# Patient Record
Sex: Male | Born: 1955
Health system: Southern US, Community
[De-identification: ages and names within clinical notes are randomized; demographics above are authoritative.]

## PROBLEM LIST (undated history)

## (undated) DIAGNOSIS — E7800 Pure hypercholesterolemia, unspecified: Secondary | ICD-10-CM

## (undated) DIAGNOSIS — M1990 Unspecified osteoarthritis, unspecified site: Secondary | ICD-10-CM

## (undated) DIAGNOSIS — M3320 Polymyositis, organ involvement unspecified: Secondary | ICD-10-CM

## (undated) DIAGNOSIS — G7289 Other specified myopathies: Secondary | ICD-10-CM

## (undated) DIAGNOSIS — J189 Pneumonia, unspecified organism: Secondary | ICD-10-CM

## (undated) DIAGNOSIS — M109 Gout, unspecified: Secondary | ICD-10-CM

## (undated) DIAGNOSIS — M25569 Pain in unspecified knee: Secondary | ICD-10-CM

## (undated) HISTORY — PX: EYE SURGERY: SHX253

## (undated) HISTORY — PX: KNEE SURGERY: SHX244

## (undated) HISTORY — DX: Pure hypercholesterolemia, unspecified: E78.00

## (undated) HISTORY — PX: OTHER SURGICAL HISTORY: SHX169

## (undated) HISTORY — DX: Unspecified osteoarthritis, unspecified site: M19.90

## (undated) HISTORY — DX: Pain in unspecified knee: M25.569

## (undated) HISTORY — PX: ANKLE SURGERY: SHX546

---

## 2003-11-08 ENCOUNTER — Ambulatory Visit (HOSPITAL_COMMUNITY): Admission: RE | Admit: 2003-11-08 | Discharge: 2003-11-08 | Payer: Self-pay | Admitting: Internal Medicine

## 2009-06-15 ENCOUNTER — Encounter: Payer: Self-pay | Admitting: Internal Medicine

## 2009-06-28 ENCOUNTER — Ambulatory Visit: Payer: Self-pay | Admitting: Internal Medicine

## 2009-06-28 ENCOUNTER — Ambulatory Visit (HOSPITAL_COMMUNITY): Admission: RE | Admit: 2009-06-28 | Discharge: 2009-06-28 | Payer: Self-pay | Admitting: Internal Medicine

## 2009-07-03 ENCOUNTER — Encounter: Payer: Self-pay | Admitting: Internal Medicine

## 2014-09-27 ENCOUNTER — Other Ambulatory Visit (HOSPITAL_COMMUNITY): Payer: Self-pay | Admitting: Internal Medicine

## 2014-09-27 DIAGNOSIS — R7989 Other specified abnormal findings of blood chemistry: Secondary | ICD-10-CM

## 2014-09-27 DIAGNOSIS — R945 Abnormal results of liver function studies: Principal | ICD-10-CM

## 2014-09-30 ENCOUNTER — Ambulatory Visit (HOSPITAL_COMMUNITY)
Admission: RE | Admit: 2014-09-30 | Discharge: 2014-09-30 | Disposition: A | Discharge status: Home / Self Care | Payer: Managed Care, Other (non HMO) | Source: Ambulatory Visit | Attending: Internal Medicine | Admitting: Internal Medicine

## 2014-09-30 ENCOUNTER — Ambulatory Visit (HOSPITAL_COMMUNITY): Payer: Managed Care, Other (non HMO)

## 2014-09-30 DIAGNOSIS — R109 Unspecified abdominal pain: Secondary | ICD-10-CM | POA: Insufficient documentation

## 2014-09-30 DIAGNOSIS — R7989 Other specified abnormal findings of blood chemistry: Secondary | ICD-10-CM | POA: Insufficient documentation

## 2014-09-30 DIAGNOSIS — R945 Abnormal results of liver function studies: Secondary | ICD-10-CM

## 2014-10-10 ENCOUNTER — Encounter: Payer: Self-pay | Admitting: Internal Medicine

## 2014-10-24 ENCOUNTER — Encounter: Payer: Self-pay | Admitting: Nurse Practitioner

## 2014-10-24 ENCOUNTER — Other Ambulatory Visit: Payer: Self-pay | Admitting: Nurse Practitioner

## 2014-10-24 ENCOUNTER — Ambulatory Visit (INDEPENDENT_AMBULATORY_CARE_PROVIDER_SITE_OTHER): Payer: Managed Care, Other (non HMO) | Admitting: Nurse Practitioner

## 2014-10-24 VITALS — BP 122/79 | HR 73 | Temp 97.0°F | Ht 73.0 in | Wt 228.4 lb

## 2014-10-24 DIAGNOSIS — R740 Nonspecific elevation of levels of transaminase and lactic acid dehydrogenase [LDH]: Secondary | ICD-10-CM

## 2014-10-24 DIAGNOSIS — R7401 Elevation of levels of liver transaminase levels: Secondary | ICD-10-CM | POA: Insufficient documentation

## 2014-10-24 LAB — HEPATIC FUNCTION PANEL
ALT: 219 U/L — AB (ref 0–53)
AST: 158 U/L — AB (ref 0–37)
Albumin: 4.1 g/dL (ref 3.5–5.2)
Alkaline Phosphatase: 80 U/L (ref 39–117)
BILIRUBIN TOTAL: 0.4 mg/dL (ref 0.2–1.2)
Bilirubin, Direct: 0.1 mg/dL (ref 0.0–0.3)
Indirect Bilirubin: 0.3 mg/dL (ref 0.2–1.2)
TOTAL PROTEIN: 7 g/dL (ref 6.0–8.3)

## 2014-10-24 LAB — FERRITIN: Ferritin: 158 ng/mL (ref 22–322)

## 2014-10-24 LAB — IRON AND TIBC
%SAT: 24 % (ref 20–55)
Iron: 69 ug/dL (ref 42–165)
TIBC: 290 ug/dL (ref 215–435)
UIBC: 221 ug/dL (ref 125–400)

## 2014-10-24 NOTE — Patient Instructions (Addendum)
We are ordering several labs to check for other causes of your elevated liver enzymes  Start a low-fat diet and exercise to promote weight loss  Return for follow-up in 4 weeks   Fatty Liver Fatty liver is the accumulation of fat in liver cells. It is also called hepatosteatosis or steatohepatitis. It is normal for your liver to contain some fat. If fat is more than 5 to 10% of your liver's weight, you have fatty liver.  There are often no symptoms (problems) for years while damage is still occurring. People often learn about their fatty liver when they have medical tests for other reasons. Fat can damage your liver for years or even decades without causing problems. When it becomes severe, it can cause fatigue, weight loss, weakness, and confusion. This makes you more likely to develop more serious liver problems. The liver is the largest organ in the body. It does a lot of work and often gives no warning signs when it is sick until late in a disease. The liver has many important jobs including:  Breaking down foods.  Storing vitamins, iron, and other minerals.  Making proteins.  Making bile for food digestion.  Breaking down many products including medications, alcohol and some poisons. CAUSES  There are a number of different conditions, medications, and poisons that can cause a fatty liver. Eating too many calories causes fat to build up in the liver. Not processing and breaking fats down normally may also cause this. Certain conditions, such as obesity, diabetes, and high triglycerides also cause this. Most fatty liver patients tend to be middle-aged and over weight.  Some causes of fatty liver are:  Alcohol over consumption.  Malnutrition.  Steroid use.  Valproic acid toxicity.  Obesity.  Cushing's syndrome.  Poisons.  Tetracycline in high dosages.  Pregnancy.  Diabetes.  Hyperlipidemia.  Rapid weight loss. Some people develop fatty liver even having none of  these conditions. SYMPTOMS  Fatty liver most often causes no problems. This is called asymptomatic.  It can be diagnosed with blood tests and also by a liver biopsy.  It is one of the most common causes of minor elevations of liver enzymes on routine blood tests.  Specialized Imaging of the liver using ultrasound, CT (computed tomography) scan, or MRI (magnetic resonance imaging) can suggest a fatty liver but a biopsy is needed to confirm it.  A biopsy involves taking a small sample of liver tissue. This is done by using a needle. It is then looked at under a microscope by a specialist. TREATMENT  It is important to treat the cause. Simple fatty liver without a medical reason may not need treatment.  Weight loss, fat restriction, and exercise in overweight patients produces inconsistent results but is worth trying.  Fatty liver due to alcohol toxicity may not improve even with stopping drinking.  Good control of diabetes may reduce fatty liver.  Lower your triglycerides through diet, medication or both.  Eat a balanced, healthy diet.  Increase your physical activity.  Get regular checkups from a liver specialist.  There are no medical or surgical treatments for a fatty liver or NASH, but improving your diet and increasing your exercise may help prevent or reverse some of the damage. PROGNOSIS  Fatty liver may cause no damage or it can lead to an inflammation of the liver. This is, called steatohepatitis. When it is linked to alcohol abuse, it is called alcoholic steatohepatitis. It often is not linked to alcohol. It is  then called nonalcoholic steatohepatitis, or NASH. Over time the liver may become scarred and hardened. This condition is called cirrhosis. Cirrhosis is serious and may lead to liver failure or cancer. NASH is one of the leading causes of cirrhosis. About 10-20% of Americans have fatty liver and a smaller 2-5% has NASH. Document Released: 09/13/2005 Document Revised:  10/21/2011 Document Reviewed: 12/08/2013 [facility] Patient Information 2015 [place], [place]. This information is not intended to replace advice given to you by your health care provider. Make sure you discuss any questions you have with your health care provider.

## 2014-10-24 NOTE — Progress Notes (Signed)
Primary Care Physician:  Asencion Noble, MD Primary Gastroenterologist:  Dr. Gala Romney  Chief Complaint  Patient presents with  . abnormal lab levels    HPI:   59 year old male presents on referral from PCP for elevated liver enzymes. His AST/ALT were last noted normal at 20/22 on 06/19/2013, first noted elevated at 107/104 on 06/25/2014 at which point his statin was held and additional testing ordered. On 09/19/14 his is Hep B surface Ag, Hep B surface Ab, Hep A IgM Ab, and Hep C Ab were all negative, AST/ALT continued elevation now at 181/222 despite held statin. Abdominal US showed GB no gallstones or wall thickening, no sonographic Murphy's sign; liver is echogenic consistent with fatty infiltration and/or hepatocellular disease, multiple tiny punctate hypoechoic foci are noted most likely tiny simple cysts, no solid lesion identified. He denies drinking. His weight has come on and off again and tends to carry his weight abdominally. Denies abdominal pain, jaundice, itching, darkened urine, grey stools. Denies N/V/D, has a bowel movement daily which varies between normal/soft and constipation. Drinks minimal water daily (about 16 ounces), eats 2 servings of fruits/vegetables daily. Has seen rare toilet tissue hematochezia associated with straining and constipation which resolved when his constipation and straining does. Last colonoscopy in 2010 with recommended 10 year follow-up. Denies any other upper or lower GI symptoms.    Past Medical History  Diagnosis Date  . Hypercholesteremia   . Knee pain   . Arthritis     Past Surgical History  Procedure Laterality Date  . Ankle surgery Left   . Knee surgery Left     No current outpatient prescriptions on file.   No current facility-administered medications for this visit.    Allergies as of 10/24/2014  . (No Known Allergies)    Family History  Problem Relation Age of Onset  . Colon cancer Neg Hx   . Liver disease Neg Hx   .  Hypertension Father   . Hypercholesterolemia Sister   . Hypercholesterolemia Father   . Hypertension Mother   . Breast cancer Sister     History   Social History  . Marital Status: Married    Spouse Name: N/A  . Number of Children: N/A  . Years of Education: N/A   Occupational History  . Not on file.   Social History Main Topics  . Smoking status: Never Smoker   . Smokeless tobacco: Not on file  . Alcohol Use: No  . Drug Use: No  . Sexual Activity: Not on file   Other Topics Concern  . Not on file   Social History Narrative  . No narrative on file    Review of Systems: General: Negative for anorexia, weight loss, fever, chills, fatigue, weakness. Eyes: Negative for vision changes.  ENT: Negative for hoarseness, difficulty swallowing. CV: Negative for chest pain, angina, palpitations,  peripheral edema.  Respiratory: Negative for dyspnea at rest, dyspnea on exertion, wheezing.  GI: See history of present illness. Denies dark urine, generalized pruritis, jaundice, abdominal pain, confusion/lethargy. GU:  Negative for dysuria, hematuria, discolored urine.  MS: Negative for joint pain, low back pain.  Derm: Negative for rash or itching.  Neuro: Negative for weakness, seizure, memory loss, confusion.  Psych: Negative for anxiety, depression.  Endo: Negative for unusual weight change.  Heme: Negative for bruising or bleeding. Allergy: Negative for rash or hives.   Physical Exam: BP 122/79 mmHg  Pulse 73  Temp(Src) 97 F (36.1 C)  Ht 6\' 1"  (  1.854 m)  Wt 228 lb 6.4 oz (103.602 kg)  BMI 30.14 kg/m2 General:   Alert and oriented. Pleasant and cooperative. Well-nourished and well-developed.  Head:  Normocephalic and atraumatic. Eyes:  Without icterus, sclera clear and conjunctiva pink.  Ears:  Normal auditory acuity. Mouth:  No deformity or lesions, oral mucosa pink. No OP edema. Neck:  Supple, without mass or thyromegaly. Lungs:  Clear to auscultation bilaterally.  No wheezes, rales, or rhonchi. No distress.  Heart:  S1, S2 present without murmurs appreciated.  Abdomen:  +BS, soft, non-tender and non-distended. No HSM noted. No guarding or rebound. No masses appreciated. No jaundice. Rectal:  Deferred  Msk:  Symmetrical without gross deformities. Normal posture. Pulses:  Normal pulses noted. Extremities:  Without clubbing or edema. Neurologic:  Alert and  oriented x4;  grossly normal neurologically. Skin:  Intact without significant lesions or rashes. Cervical Nodes:  No significant cervical adenopathy. Psych:  Alert and cooperative. Normal mood and affect.     10/24/2014 8:37 AM

## 2014-10-24 NOTE — Assessment & Plan Note (Signed)
Patient referred to [place] by primary care provider for increased AST and ALT on lab exam. On 06/25/14 his AST was 107, ALT 104. Return exam on 09/16/2014 with his PCP after discontinuing his statin medication showed AST 181, ALT 222. All other liver parameters including bilirubin, direct bilirubin, alkaline phosphatase, and albumin were normal. Denies any symptoms of liver disease including scleral icterus, jaundice, dark-colored urine, or generalized pruritus. Abdominal ultrasound completed on 09/30/2014 shows liver consistent with fatty infiltration and/or hepatocellular disease. Multiple tiny punctate hyperechoic foci are noted most likely tiny simple cysts. Patient with obesity and noted body shape consistent with abdominal obesity. Likely etiology of his transaminitis is fatty infiltration of the liver, but cannot rule out other occult etiology. Today we'll repeat a hepatic function panel, iron panel, ferritin, TIBC, ANA, anti-smooth muscle antibody, anti-liver/kidney microsomal antibody. Patient to return for follow-up in 4 weeks to review lab results and make further plans.

## 2014-10-25 LAB — ANA: ANA: NEGATIVE

## 2014-10-25 LAB — ANTI-SMOOTH MUSCLE ANTIBODY, IGG: Smooth Muscle Ab: 9 U (ref <20)

## 2014-10-25 NOTE — Progress Notes (Signed)
cc'ed to pcp °

## 2014-10-26 LAB — ANTI-MICROSOMAL ANTIBODY LIVER / KIDNEY: LKM1 Ab: <=20 U (ref <=20.0)

## 2014-11-15 ENCOUNTER — Telehealth: Payer: Self-pay | Admitting: Nurse Practitioner

## 2014-11-15 ENCOUNTER — Other Ambulatory Visit: Payer: Self-pay

## 2014-11-15 DIAGNOSIS — R740 Nonspecific elevation of levels of transaminase and lactic acid dehydrogenase [LDH]: Principal | ICD-10-CM

## 2014-11-15 DIAGNOSIS — R7401 Elevation of levels of liver transaminase levels: Secondary | ICD-10-CM

## 2014-11-15 NOTE — Telephone Encounter (Signed)
Can we call the patient and set-up a CT with contrast of the abdomen and pelvis to further evaluate? I'd like to try and have it done by the time of his follow-up visit. Thanks!

## 2014-11-15 NOTE — Telephone Encounter (Signed)
Pt is aware and its ok to set up ct. He is aware that it will have to be approved with his insurance first. He prefers to go on a Friday because he is off work on Fridays.   Please schedule.

## 2014-11-15 NOTE — Telephone Encounter (Signed)
Tried to call pt- LMOM 

## 2014-11-15 NOTE — Telephone Encounter (Signed)
-----   Message from Daneil Dolin, MD sent at 11/12/2014 11:00 AM EDT ----- This type of pt can be challenging.  My initial thoughts are as follows:  Get a contrast CT now given parenchymal abnormalities on U/S and up-tick of numbers in spite of with holding statin (just don't want to get blind-sided by metastatic disease - which I've seen present this way a couple of times over the years ) .  If upward trend and negative CT, nothing wrong with pursuing a TJ liver bx and wedge pressure checks.  At the end of the day, may just be NASH (we'd need to make him a coffee drinker, and get him more physically fit).  Any family hx of liver issues?.  We can discuss further.  Would get the CT now. ----- Message -----    From: Carlis Stable, NP    Sent: 11/11/2014   4:02 PM      To: Daneil Dolin, MD  Hey Dr. Gala Romney. Can you look at this patient and see if you have any other thoughts. He's had a new increase in LFTs which continues despite d/c his statin. Hep serologies all negative, other (multiple) labs normal except AST/ALT continues to be elevated. [place] recently done consistent with fatty liver. Is a CT warranted at this point? Will fatty liver cause a new, somewhat acute AST/ALT elevation like this? He has a follow-up in a few weeks.

## 2014-11-16 NOTE — Telephone Encounter (Signed)
Called pt and LMOM regarding his CT scan. Pt is scheduled for 11/18/2014 @ 10:00am.  Told pt on message to go pick up oral contrast.  NO pre-cert required per Vicente Males at [facility].

## 2014-11-16 NOTE — Telephone Encounter (Signed)
Pt called back and is aware of CT appointment

## 2014-11-18 ENCOUNTER — Other Ambulatory Visit (HOSPITAL_COMMUNITY): Payer: Managed Care, Other (non HMO)

## 2014-11-18 ENCOUNTER — Ambulatory Visit (HOSPITAL_COMMUNITY)
Admission: RE | Admit: 2014-11-18 | Discharge: 2014-11-18 | Disposition: A | Discharge status: Home / Self Care | Payer: Managed Care, Other (non HMO) | Source: Ambulatory Visit | Attending: Nurse Practitioner | Admitting: Nurse Practitioner

## 2014-11-18 DIAGNOSIS — R740 Nonspecific elevation of levels of transaminase and lactic acid dehydrogenase [LDH]: Secondary | ICD-10-CM

## 2014-11-18 DIAGNOSIS — R634 Abnormal weight loss: Secondary | ICD-10-CM | POA: Diagnosis not present

## 2014-11-18 DIAGNOSIS — R945 Abnormal results of liver function studies: Secondary | ICD-10-CM | POA: Diagnosis not present

## 2014-11-18 DIAGNOSIS — R7401 Elevation of levels of liver transaminase levels: Secondary | ICD-10-CM

## 2014-11-18 MED ORDER — IOHEXOL 300 MG/ML  SOLN
100.0000 mL | Freq: Once | INTRAMUSCULAR | Status: AC | PRN
  Administered 2014-11-18: 100 mL via INTRAVENOUS

## 2014-11-25 ENCOUNTER — Ambulatory Visit (INDEPENDENT_AMBULATORY_CARE_PROVIDER_SITE_OTHER): Payer: Managed Care, Other (non HMO) | Admitting: Nurse Practitioner

## 2014-11-25 ENCOUNTER — Encounter: Payer: Self-pay | Admitting: Nurse Practitioner

## 2014-11-25 VITALS — BP 137/86 | HR 69 | Temp 97.0°F | Ht 73.0 in | Wt 222.8 lb

## 2014-11-25 DIAGNOSIS — R740 Nonspecific elevation of levels of transaminase and lactic acid dehydrogenase [LDH]: Secondary | ICD-10-CM | POA: Diagnosis not present

## 2014-11-25 DIAGNOSIS — R7401 Elevation of levels of liver transaminase levels: Secondary | ICD-10-CM

## 2014-11-25 NOTE — Assessment & Plan Note (Signed)
59 year old male returns for evaluation of transaminitis. Hepatic function panel checked at last visit 1 month ago shows slight downward trend and overall stability of his elevated AST/ALT. Autoimmune serologies completing normal, hepatitis serologies normal. CT with no explanation for elevated LFTs only demonstrating innumerable hepatic cysts. Patient is completely asymptomatic at this point. Takes no medications. No herbal supplements or vitamins/minerals. He has a making a concerted effort to lose weight and is also pounds in the past 2 weeks by cutting out soft drinks and improving his diet. Encouraged him to continue his weight loss efforts, increase physical activity to at least 30 minutes a day of cardiovascular exercise for at least 3 days a week. Encouraged to drink coffee if he likes to. At this point we'll recheck his hepatic function panel today and plan for him to return in 3 months for reevaluation. At this time he is electing to hold off on a transjugular liver biopsy for further evaluation, but he is amenable to this in the future if no improvement in his condition.

## 2014-11-25 NOTE — Patient Instructions (Signed)
1. Increase your physical activity to 30 mins of cardiovascular exercise at least 3 times a day 2. Coffee has been shown to be protective as well. 3. Continue your weight loss efforts 4. We will recheck your labs today and call with results 5. Return for a follow-up in 3 months and we will re-evaluate things then

## 2014-11-25 NOTE — Progress Notes (Signed)
    Referring Provider: Asencion Noble, MD Primary Care Physician:  Asencion Noble, MD Primary GI:  Dr. Gala Romney  Chief Complaint  Patient presents with  . Follow-up    HPI:   59 year old male presents for follow-up on transaminitis. Labs ordered last visit show minimal trend downward and AST/ALT although they remain significantly elevated at 158/219. Alkaline phosphatase, bilirubin, direct bilirubin, ferritin, ANA, smooth muscle antibody, anti-microsomal antibody for liver and kidney all normal. CT of the abdomen with contrast was ordered and shows innumerable hypoattenuating lesions within the liver, the largest being consistent and favored to represent either hepatic cysts and/or biliary hematomas. Otherwise, no explanation for patient's elevated LFTs.  Today he states he's feeling well. Denies melena, hematochezia, jaundice, scleral icterus, darkened urine, generalized pruritis. Denies fever, chills, nausea, vomiting diarrhea. Has been eating better and drinking more water, cut out soft drinks and has lost 6 pounds. Denies any other upper or lower GI symptoms.  Past Medical History  Diagnosis Date  . Hypercholesteremia   . Knee pain   . Arthritis     Past Surgical History  Procedure Laterality Date  . Ankle surgery Left   . Knee surgery Left     No current outpatient prescriptions on file.   No current facility-administered medications for this visit.    Allergies as of 11/25/2014  . (No Known Allergies)    Family History  Problem Relation Age of Onset  . Colon cancer Neg Hx   . Liver disease Neg Hx   . Hypertension Father   . Hypercholesterolemia Sister   . Hypercholesterolemia Father   . Hypertension Mother   . Breast cancer Sister     History   Social History  . Marital Status: Married    Spouse Name: N/A  . Number of Children: N/A  . Years of Education: N/A   Social History Main Topics  . Smoking status: Never Smoker   . Smokeless tobacco: Not on file  .  Alcohol Use: No  . Drug Use: No  . Sexual Activity: Not on file   Other Topics Concern  . None   Social History Narrative    Review of Systems: Gen: Denies fever, chills, anorexia. Denies fatigue, weakness, weight loss.  CV: Denies chest pain, palpitations, syncope, peripheral edema. Resp: Denies dyspnea at rest, wheezing, coughing up blood. GI: See HPI. Derm: Denies rash, itching, dry skin Psych: Denies depression, anxiety, memory loss, confusion.  Heme: Denies bruising, bleeding, and enlarged lymph nodes.  Physical Exam: BP 137/86 mmHg  Pulse 69  Temp(Src) 97 F (36.1 C)  Ht 6\' 1"  (1.854 m)  Wt 222 lb 12.8 oz (101.061 kg)  BMI 29.40 kg/m2 General:   Alert and oriented. No distress noted. Pleasant and cooperative.  Head:  Normocephalic and atraumatic. Eyes:  Conjuctiva clear without scleral icterus. Mouth:  Oral mucosa pink and moist. No lesions. Lungs:  Clear to auscultation bilaterally. No wheezes, rales, or rhonchi. No distress.  Heart:  S1, S2 present without murmurs, rubs, or gallops. Regular rate and rhythm. Abdomen:  +BS, soft, non-tender and non-distended. No rebound or guarding. No HSM or masses noted. Msk:  Symmetrical without gross deformities. Normal posture. Pulses:  2+ DP noted bilaterally Extremities:  Without edema. Neurologic:  Alert and  oriented x4;  grossly normal neurologically. Skin:  Intact without significant lesions or rashes. No jaundice noted. Psych:  Alert and cooperative. Normal mood and affect.    11/25/2014 10:32 AM

## 2014-11-28 NOTE — Progress Notes (Signed)
cc'ed to pcp °

## 2015-01-04 ENCOUNTER — Other Ambulatory Visit: Payer: Self-pay

## 2015-01-04 ENCOUNTER — Telehealth: Payer: Self-pay | Admitting: Nurse Practitioner

## 2015-01-04 ENCOUNTER — Other Ambulatory Visit: Payer: Self-pay | Admitting: Nurse Practitioner

## 2015-01-04 DIAGNOSIS — R740 Nonspecific elevation of levels of transaminase and lactic acid dehydrogenase [LDH]: Principal | ICD-10-CM

## 2015-01-04 DIAGNOSIS — R7401 Elevation of levels of liver transaminase levels: Secondary | ICD-10-CM

## 2015-01-04 NOTE — Telephone Encounter (Signed)
LMOM FOR HIM TO CALL BACK

## 2015-01-04 NOTE — Telephone Encounter (Signed)
Please tell the patient his liver enzymes remain elevated (AST/ALT 281/354). We havn't done a screening test for Hep C and I'd like to add that at this point if he's agreeable. Keep his follow-up appointment to discuss further options.

## 2015-01-04 NOTE — Telephone Encounter (Signed)
Which test would like for [place] to day for the Hep C. Please advise

## 2015-01-05 NOTE — Telephone Encounter (Signed)
Pt is aware of the results and is going to have the Hep C blood work done

## 2015-01-06 LAB — HEPATITIS C ANTIBODY: HCV AB: NEGATIVE

## 2015-01-24 ENCOUNTER — Emergency Department (HOSPITAL_COMMUNITY): Payer: Managed Care, Other (non HMO)

## 2015-01-24 ENCOUNTER — Inpatient Hospital Stay (HOSPITAL_COMMUNITY)
Admission: EM | Admit: 2015-01-24 | Discharge: 2015-01-26 | DRG: 547 | Disposition: A | Discharge status: Home / Self Care | Payer: Managed Care, Other (non HMO) | Attending: Internal Medicine | Admitting: Internal Medicine

## 2015-01-24 ENCOUNTER — Encounter (HOSPITAL_COMMUNITY): Payer: Self-pay

## 2015-01-24 DIAGNOSIS — E780 Pure hypercholesterolemia: Secondary | ICD-10-CM | POA: Diagnosis present

## 2015-01-24 DIAGNOSIS — M1990 Unspecified osteoarthritis, unspecified site: Secondary | ICD-10-CM | POA: Diagnosis present

## 2015-01-24 DIAGNOSIS — Z8249 Family history of ischemic heart disease and other diseases of the circulatory system: Secondary | ICD-10-CM | POA: Diagnosis not present

## 2015-01-24 DIAGNOSIS — R29898 Other symptoms and signs involving the musculoskeletal system: Secondary | ICD-10-CM | POA: Diagnosis present

## 2015-01-24 DIAGNOSIS — R748 Abnormal levels of other serum enzymes: Secondary | ICD-10-CM | POA: Diagnosis present

## 2015-01-24 DIAGNOSIS — M3320 Polymyositis, organ involvement unspecified: Secondary | ICD-10-CM | POA: Diagnosis not present

## 2015-01-24 DIAGNOSIS — R531 Weakness: Secondary | ICD-10-CM | POA: Diagnosis present

## 2015-01-24 LAB — COMPREHENSIVE METABOLIC PANEL
ALBUMIN: 3.8 g/dL (ref 3.5–5.0)
ALT: 456 U/L — AB (ref 17–63)
AST: 332 U/L — AB (ref 15–41)
Alkaline Phosphatase: 63 U/L (ref 38–126)
Anion gap: 8 (ref 5–15)
BILIRUBIN TOTAL: 0.5 mg/dL (ref 0.3–1.2)
BUN: 24 mg/dL — ABNORMAL HIGH (ref 6–20)
CHLORIDE: 103 mmol/L (ref 101–111)
CO2: 27 mmol/L (ref 22–32)
Calcium: 8.6 mg/dL — ABNORMAL LOW (ref 8.9–10.3)
Creatinine, Ser: 0.74 mg/dL (ref 0.61–1.24)
GFR calc Af Amer: >60 mL/min (ref >60)
Glucose, Bld: 88 mg/dL (ref 65–99)
Potassium: 3.8 mmol/L (ref 3.5–5.1)
SODIUM: 138 mmol/L (ref 135–145)
Total Protein: 7.2 g/dL (ref 6.5–8.1)

## 2015-01-24 LAB — CBC WITH DIFFERENTIAL/PLATELET
BASOS PCT: 0 % (ref 0–1)
Basophils Absolute: 0 10*3/uL (ref 0.0–0.1)
EOS ABS: 0.1 10*3/uL (ref 0.0–0.7)
Eosinophils Relative: 2 % (ref 0–5)
HEMATOCRIT: 39.5 % (ref 39.0–52.0)
Hemoglobin: 12.8 g/dL — ABNORMAL LOW (ref 13.0–17.0)
Lymphocytes Relative: 23 % (ref 12–46)
Lymphs Abs: 1.4 10*3/uL (ref 0.7–4.0)
MCH: 28.6 pg (ref 26.0–34.0)
MCHC: 32.4 g/dL (ref 30.0–36.0)
MCV: 88.4 fL (ref 78.0–100.0)
MONO ABS: 0.4 10*3/uL (ref 0.1–1.0)
MONOS PCT: 7 % (ref 3–12)
NEUTROS ABS: 4 10*3/uL (ref 1.7–7.7)
Neutrophils Relative %: 68 % (ref 43–77)
Platelets: 210 10*3/uL (ref 150–400)
RBC: 4.47 MIL/uL (ref 4.22–5.81)
RDW: 13.4 % (ref 11.5–15.5)
WBC: 5.9 10*3/uL (ref 4.0–10.5)

## 2015-01-24 LAB — D-DIMER, QUANTITATIVE (NOT AT ARMC): D DIMER QUANT: 0.33 ug{FEU}/mL (ref 0.00–0.48)

## 2015-01-24 LAB — URINALYSIS, ROUTINE W REFLEX MICROSCOPIC
Bilirubin Urine: NEGATIVE
GLUCOSE, UA: NEGATIVE mg/dL
Hgb urine dipstick: NEGATIVE
LEUKOCYTES UA: NEGATIVE
NITRITE: NEGATIVE
PH: 6 (ref 5.0–8.0)
Protein, ur: NEGATIVE mg/dL
SPECIFIC GRAVITY, URINE: 1.02 (ref 1.005–1.030)
Urobilinogen, UA: 0.2 mg/dL (ref 0.0–1.0)

## 2015-01-24 LAB — TSH: TSH: 1.929 u[IU]/mL (ref 0.350–4.500)

## 2015-01-24 LAB — BRAIN NATRIURETIC PEPTIDE: B Natriuretic Peptide: 20 pg/mL (ref 0.0–100.0)

## 2015-01-24 LAB — CK: Total CK: 18172 U/L — ABNORMAL HIGH (ref 49–397)

## 2015-01-24 LAB — SEDIMENTATION RATE: Sed Rate: 40 mm/hr — ABNORMAL HIGH (ref 0–16)

## 2015-01-24 LAB — TROPONIN I: Troponin I: <0.03 ng/mL (ref <0.031)

## 2015-01-24 MED ORDER — HEPARIN SODIUM (PORCINE) 5000 UNIT/ML IJ SOLN: 5000.0000 [IU] | Freq: Three times a day (TID) | INTRAMUSCULAR | Status: DC

## 2015-01-24 MED ORDER — METHYLPREDNISOLONE SODIUM SUCC 1000 MG IJ SOLR
1000.0000 mg | INTRAMUSCULAR | Status: DC
Start: 2015-01-24 — End: 2015-01-26
  Administered 2015-01-24 – 2015-01-25 (×2): 1000 mg via INTRAVENOUS
  Filled 2015-01-24 (×3): qty 8

## 2015-01-24 MED ORDER — ONDANSETRON HCL 4 MG PO TABS: 4.0000 mg | ORAL_TABLET | Freq: Four times a day (QID) | ORAL | Status: DC | PRN

## 2015-01-24 MED ORDER — METHYLPREDNISOLONE SODIUM SUCC 1000 MG IJ SOLR
INTRAMUSCULAR | Status: AC
  Filled 2015-01-24: qty 8

## 2015-01-24 MED ORDER — SODIUM CHLORIDE 0.9 % IV SOLN
INTRAVENOUS | Status: DC
  Administered 2015-01-24 – 2015-01-25 (×2): via INTRAVENOUS

## 2015-01-24 MED ORDER — ONDANSETRON HCL 4 MG/2ML IJ SOLN: 4.0000 mg | Freq: Four times a day (QID) | INTRAMUSCULAR | Status: DC | PRN

## 2015-01-24 NOTE — ED Provider Notes (Signed)
CSN: [id number]     Arrival date & time 01/24/15  1517 History   First MD Initiated Contact with Patient 01/24/15 1522     Chief Complaint  Patient presents with  . Weakness     (Consider location/radiation/quality/duration/timing/severity/associated sxs/prior Treatment) HPI Comments: Patient is a 59 year old male with history of high cholesterol. He presents today for evaluation of progressive weakness over the past month. He is reporting that he is able to walk, but is having difficulty getting up from a seated position. Today at work he became short of breath and his weakness significantly worsened. He was brought by EMS for evaluation of this. He denies any fevers or chills. He denies any productive cough. Denies any chest pain.  Patient is a 59 y.o. male presenting with weakness. The history is provided by the patient.  Weakness This is a new problem. Episode onset: 4 weeks ago. The problem occurs constantly. The problem has been gradually worsening. Associated symptoms include shortness of breath. Pertinent negatives include no chest pain. Nothing aggravates the symptoms. Nothing relieves the symptoms. He has tried nothing for the symptoms. The treatment provided no relief.    Past Medical History  Diagnosis Date  . Hypercholesteremia   . Knee pain   . Arthritis    Past Surgical History  Procedure Laterality Date  . Ankle surgery Left   . Knee surgery Left    Family History  Problem Relation Age of Onset  . Colon cancer Neg Hx   . Liver disease Neg Hx   . Hypertension Father   . Hypercholesterolemia Sister   . Hypercholesterolemia Father   . Hypertension Mother   . Breast cancer Sister    History  Substance Use Topics  . Smoking status: Never Smoker   . Smokeless tobacco: Never Used  . Alcohol Use: No     Comment: Last ETOH was 1-2 drinks in 1990.    Review of Systems  Respiratory: Positive for shortness of breath.   Cardiovascular: Negative for chest pain.   Neurological: Positive for weakness.  All other systems reviewed and are negative.     Allergies  Review of patient's allergies indicates no known allergies.  Home Medications   Prior to Admission medications   Not on File   BP 121/75 mmHg  Pulse 74  Temp(Src) 97.6 F (36.4 C) (Oral)  Resp 16  Ht '6\' 1"'$  (1.854 m)  Wt 207 lb (93.895 kg)  BMI 27.32 kg/m2  SpO2 98% Physical Exam  Constitutional: He is oriented to person, place, and time. He appears well-developed and well-nourished. No distress.  HENT:  Head: Normocephalic and atraumatic.  Mouth/Throat: Oropharynx is clear and moist.  Neck: Normal range of motion. Neck supple.  Cardiovascular: Normal rate, regular rhythm and normal heart sounds.   No murmur heard. Pulmonary/Chest: Effort normal and breath sounds normal. No respiratory distress. He has no wheezes. He has no rales.  Abdominal: Soft. Bowel sounds are normal. He exhibits no distension. There is no tenderness.  Musculoskeletal: Normal range of motion. He exhibits edema.  There is 1+ pitting edema bilaterally.  Lymphadenopathy:    He has no cervical adenopathy.  Neurological: He is alert and oriented to person, place, and time.  Strength is diminished in the bilateral lower extremities. Reflexes are minimal in the patellar and Achilles tendons.  Skin: Skin is warm and dry. He is not diaphoretic.  Nursing note and vitals reviewed.   ED Course  Procedures (including critical care time) Labs Review Labs  Reviewed  COMPREHENSIVE METABOLIC PANEL  CBC WITH DIFFERENTIAL/PLATELET  TROPONIN I  CK  TSH  BRAIN NATRIURETIC PEPTIDE    Imaging Review No results found.  ED ECG REPORT   Date: 01/24/2015  Rate: 80  Rhythm: normal sinus rhythm  QRS Axis: normal  Intervals: normal  ST/T Wave abnormalities: normal  Conduction Disutrbances:none  Narrative Interpretation:   Old EKG Reviewed: none available  I have personally reviewed the EKG tracing and agree  with the computerized printout as noted.   MDM   Final diagnoses:  Bilateral leg weakness    Patient is a 59 year old male who presents with complaints of progressive weakness over the past month which has become significantly worse the past few days. He states he is having difficulty getting up off the floor and standing up from a sitting position. He reports to me he is unable to lift his legs.  Workup was initiated including blood work, urinalysis, and MRI of the thoracic and lumbar spine. The imaging studies were not diagnostic of a cause for his symptoms. The laboratory studies show a markedly elevated CK of 18,000 and moderate elevations of his transaminases. He was given normal saline and consultation to the hospitalist service was made. He will be admitted for further workup of this condition. The laboratory studies are suggestive of possible polymyositis. We will admit to the hospitalist service under the care of Dr. Anastasio Champion.  CRITICAL CARE Performed by: Veryl Speak Total critical care time: 30 minutes Critical care time was exclusive of separately billable procedures and treating other patients. Critical care was necessary to treat or prevent imminent or life-threatening deterioration. Critical care was time spent personally by me on the following activities: development of treatment plan with patient and/or surrogate as well as nursing, discussions with consultants, evaluation of patient's response to treatment, examination of patient, obtaining history from patient or surrogate, ordering and performing treatments and interventions, ordering and review of laboratory studies, ordering and review of radiographic studies, pulse oximetry and re-evaluation of patient's condition.    Veryl Speak, MD 01/24/15 2116

## 2015-01-24 NOTE — Progress Notes (Signed)
Patient complaining of a headache. Called on-call MD, will follow any new orders given and continue to monitor.

## 2015-01-24 NOTE — ED Notes (Signed)
Awaiting patients return from Xray to collect urine

## 2015-01-24 NOTE — ED Notes (Signed)
Pt back in room.

## 2015-01-24 NOTE — H&P (Signed)
[facility] History and Physical  Jacob Hunter DOB: [birth year] DOA: 01/24/2015  Referring physician: ER, Dr. Stark Jock PCP: Asencion Noble, MD   Chief Complaint: Bilateral leg weakness.  HPI: Jacob Hunter is a 59 y.o. male  This is a 59 year old man who gives a one-month history of aggressive weakness of both his legs more on the right than the left. He denies any injury to his back and he denies any back pain. Today, he came to the emergency room because he could not stand up from a chair and he became very weak. He also described dyspnea on exertion because he said that he was having to exert himself just trying to walk and move his legs. He denies any chest pain or palpitations. There is no swelling of his legs. He denies any pain in his legs. There is no skin rash. There is no fever. He has been investigated by gastroenterology for elevated AST and ALT levels. Evaluation in the emergency room shows significantly elevated AST and ALT levels but also significantly elevated CPK over 18,000. He is now being admitted for further investigation.   Review of Systems:  Apart from symptoms above, all systems negative.   Past Medical History  Diagnosis Date  . Hypercholesteremia   . Knee pain   . Arthritis    Past Surgical History  Procedure Laterality Date  . Ankle surgery Left   . Knee surgery Left    Social History:  reports that he has never smoked. He has never used smokeless tobacco. He reports that he does not drink alcohol or use illicit drugs.  No Known Allergies  Family History  Problem Relation Age of Onset  . Colon cancer Neg Hx   . Liver disease Neg Hx   . Hypertension Father   . Hypercholesterolemia Sister   . Hypercholesterolemia Father   . Hypertension Mother   . Breast cancer Sister     Prior to Admission medications   Medication Sig Start Date End Date Taking? Authorizing Provider  diclofenac sodium (VOLTAREN) 1 % GEL Apply 1 application  topically 4 (four) times daily. 01/04/15   Historical Provider, MD   Physical Exam: Filed Vitals:   01/24/15 1521 01/24/15 1530 01/24/15 1600  BP: 121/75 129/82 116/80  Pulse: 74 76   Temp: 97.6 F (36.4 C)    TempSrc: Oral    Resp: 16 17   Height: '6\' 1"'$  (1.854 m)    Weight: 93.895 kg (207 lb)    SpO2: 98% 99%     Wt Readings from Last 3 Encounters:  01/24/15 93.895 kg (207 lb)  01/24/15 93.895 kg (207 lb)  11/25/14 101.061 kg (222 lb 12.8 oz)    General:  Appears calm and comfortable Eyes: PERRL, normal lids, irises & conjunctiva ENT: grossly normal hearing, lips & tongue Neck: no LAD, masses or thyromegaly Cardiovascular: RRR, no m/r/g. No LE edema. Telemetry: SR, no arrhythmias  Respiratory: CTA bilaterally, no w/r/r. Normal respiratory effort. Abdomen: soft, ntnd Skin: no rash or induration seen on limited exam Musculoskeletal: grossly normal tone BUE/BLE Psychiatric: grossly normal mood and affect, speech fluent and appropriate Neurologic: grossly non-focal. he appears to have gross bilateral leg weakness more on the right than the left. He is also tender in the quadriceps muscles bilaterally, more on the right than the left. I wonder if he has a  rash on his knuckle area, typical of polymyositis.           Labs on Admission:  Basic Metabolic Panel:  Recent Labs Lab 01/24/15 1536  NA 138  K 3.8  CL 103  CO2 27  GLUCOSE 88  BUN 24*  CREATININE 0.74  CALCIUM 8.6*   Liver Function Tests:  Recent Labs Lab 01/24/15 1536  AST 332*  ALT 456*  ALKPHOS 63  BILITOT 0.5  PROT 7.2  ALBUMIN 3.8   No results for input(s): LIPASE, AMYLASE in the last 168 hours. No results for input(s): AMMONIA in the last 168 hours. CBC:  Recent Labs Lab 01/24/15 1536  WBC 5.9  NEUTROABS 4.0  HGB 12.8*  HCT 39.5  MCV 88.4  PLT 210   Cardiac Enzymes:  Recent Labs Lab 01/24/15 1536  CKTOTAL 53614*  TROPONINI <0.03    BNP (last 3 results)  Recent Labs   01/24/15 1536  BNP 20.0    ProBNP (last 3 results) No results for input(s): PROBNP in the last 8760 hours.  CBG: No results for input(s): GLUCAP in the last 168 hours.  Radiological Exams on Admission: Dg Chest 2 View  01/24/2015   CLINICAL DATA:  59 year old male with generalized weakness and shortness of breath on exertion for 1 month. Lower extremity weakness. Initial encounter.  EXAM: CHEST  2 VIEW  COMPARISON:  CT Abdomen and Pelvis for 8,016.  FINDINGS: Upright AP and lateral views of the chest. Normal cardiac size and mediastinal contours. Visualized tracheal air column is within normal limits. The lungs are clear. No pneumothorax or effusion. No acute osseous abnormality identified.  IMPRESSION: Negative, no acute cardiopulmonary abnormality.   Electronically Signed   By: Genevie Ann M.D.   On: 01/24/2015 16:26   Mr Thoracic Spine Wo Contrast  01/24/2015   CLINICAL DATA:  Bilateral leg weakness.  EXAM: MRI THORACIC SPINE WITHOUT CONTRAST  TECHNIQUE: Multiplanar, multisequence MR imaging of the thoracic spine was performed. No intravenous contrast was administered.  COMPARISON:  None.  FINDINGS: Normal thoracic alignment.  Negative for fracture or mass.  Spinal cord signal is normal.  No cord compression or cord lesion.  No evidence of spinal infection  Mild thoracic disc degeneration with mild disc space narrowing and disc desiccation T9-10 and T10-11. Negative for disc protrusion or spinal stenosis.  Small hepatic cysts noted.  IMPRESSION: Mild thoracic disc degeneration. No cord compression or neural impingement.   Electronically Signed   By: Franchot Gallo M.D.   On: 01/24/2015 17:47   Mr Lumbar Spine Wo Contrast  01/24/2015   CLINICAL DATA:  Bilateral leg weakness  EXAM: MRI LUMBAR SPINE WITHOUT CONTRAST  TECHNIQUE: Multiplanar, multisequence MR imaging of the lumbar spine was performed. No intravenous contrast was administered.  COMPARISON:  Lumbar MRI 11/08/2003  FINDINGS: Normal lumbar  alignment. Negative for fracture or mass. No evidence of spinal infection. Conus medullaris is normal and terminates at mid L1  L1-2:  Negative  L2-3: Mild disc and facet degeneration causing mild spinal stenosis. Slight anterior slip L2-3. These features have progressed in the interval.  L3-4: Diffuse disc bulging with associated endplate osteophyte formation. Bilateral facet and ligamentum flavum hypertrophy. Moderate spinal stenosis which has progressed significantly in the interval. Mild foraminal narrowing bilaterally  L4-5: Central disc protrusion is slightly improved from the prior study. There is bilateral facet hypertrophy and moderate spinal stenosis. Spinal stenosis has progressed in the interval. Subarticular and foraminal stenosis bilaterally  L5-S1: Small central disc protrusion unchanged from the prior study. No significant neural impingement.  IMPRESSION: Mild spinal stenosis L2-3  Moderate spinal stenosis L3-4  with progression from 2005.  L4-5 central disc protrusion with moderate spinal stenosis which has progressed in the interval.  Small central disc protrusion L5-S1 without neural impingement.   Electronically Signed   By: Franchot Gallo M.D.   On: 01/24/2015 17:42    EKG: Independently reviewed. Sinus rhythm without any acute ST-T wave changes.  Assessment/Plan   1. Bilateral leg weakness. I wonder if this man has polymyositis. He has elevated CPK levels and also elevated AST and ALT levels. There does not appear to be a liver etiology for the transaminases elevation and I wonder if this is coming from muscle. He needs a muscle biopsy. I will ask surgery to see him with a view to getting a biopsy. 2. Abnormal MRI lumbar spine. He appears to have L4/L5 central disc protrusion with moderate spinal stenosis. I will ask neurology to see in consultation to get an opinion as to whether this is significant enough to cause his bilateral leg weakness. I don't believe it is.  Further  recommendations will depend on patient's hospital progress.   Code Status: Full code  DVT Prophylaxis: SCDs.  Family Communication: I discussed the plan with the patient and patient's wife at the bedside.   Disposition Plan: Depending on progress.   Time spent: One hour.  Doree Albee [facility] Pager [phone].

## 2015-01-24 NOTE — Consult Note (Signed)
Discussed with Dr. Anastasio Champion.  [facility] does not have the capability of doing muscle biopsy due to need of special processing by Pathology.  If muscle biopsy needed, suggest transfer to [facility] for further evaluation and treatment.

## 2015-01-24 NOTE — Consult Note (Signed)
[name], MD     [email or web address]          Jacob Hunter is an 59 y.o. male.   ASSESSMENT/PLAN: 1. Subacute inflammatory myopathy most likely due to polymyositis.  2. Acute dyspnea likely due to above but should also consider pulmonary embolism. 3. Elevated transaminases due to polymyositis.   Physical therapy. D-dimers. High-dose steroids IV and then transition to oral on discharge. He will also need to be placed on steroids. Medications such as Imuran long-term. Outpatient EMG. Extensive labs for following: ANA, ESR, C-reactive protein, angiotensin converting enzyme, Homocystine level, vitamin B12 level, RPR and HIV. Thyroid function tests will also be obtained. I would not recommend muscle biopsy at this time.    The patient is a 59 year old white male who presents with about a 4 to six-week history of progressive leg weakness. The patient has been able to work and do most of his ADLs although with some difficulties. He reports significant weakness on exertion and some dyspnea on exertion. He particularly has difficulties going up inclines and Heights. No focal weakness as reported. No numbness or tingling. No headaches. No chest pain. No GI or GU symptoms. The patient decided to seek medical attention today because he developed acute dyspnea and diaphoresis. The patient continues to work and ambulate. The patient does report having some muscle pain. He has not been on statins for 2 or 3 years. The patient reports having a colonoscopy within the last 10 years. He is not a smoker and has never smoked. The review of systems otherwise negative.   GENERAL: Pleasant in no acute distress.  HEENT: Supple. Atraumatic normocephalic.   ABDOMEN: soft  EXTREMITIES: No edema. Scaling  - milld -- of the palmar surfaces of the hands.   BACK: Normal.  SKIN: Normal by inspection - trunk and extremities.     MENTAL STATUS: Alert and oriented. Speech,  language and cognition are generally intact. Judgment and insight normal.   CRANIAL NERVES: Pupils are equal, round and reactive to light and accommodation; extra ocular movements are full, there is no significant nystagmus; visual fields are full; upper and lower facial muscles are normal in strength and symmetric, there is no flattening of the nasolabial folds; tongue is midline; uvula is midline; shoulder elevation is normal.  MOTOR: Deltoids 4+/5 bilaterally. Hand grip and distal strength of the upper extremities 5. Right hip flexion to/5 and left 3. Dorsiflexion 5 bilaterally. Bulk and tone are unremarkable.  COORDINATION: Left finger to nose is normal, right finger to nose is normal, No rest tremor; no intention tremor; no postural tremor; no bradykinesia.  REFLEXES: Deep tendon reflexes are symmetrical and normal - except the left knee which is 1+. The patient told that he has had surgery of the left knee. Babinski reflexes are flexor bilaterally.   SENSATION: Normal to light touch.        HOSPITALIST NOTES: This is a 59 year old man who gives a one-month history of aggressive weakness of both his legs more on the right than the left. He denies any injury to his back and he denies any back pain. Today, he came to the emergency room because he could not stand up from a chair and he became very weak. He also described dyspnea on exertion because he said that he was having to exert himself just trying to walk and move his legs. He denies any chest pain or palpitations. There is no swelling of his legs. He denies any  pain in his legs. There is no skin rash. There is no fever. He has been investigated by gastroenterology for elevated AST and ALT levels. Evaluation in the emergency room shows significantly elevated AST and ALT levels but also significantly elevated CPK over 18,000. He is now being admitted for further investigation.  Blood pressure 114/72, pulse 68, temperature 97.9 F (36.6  C), temperature source Oral, resp. rate 20, height '6\' 1"'  (1.854 m), weight 93.804 kg (206 lb 12.8 oz), SpO2 98 %.  Past Medical History  Diagnosis Date  . Hypercholesteremia   . Knee pain   . Arthritis     Past Surgical History  Procedure Laterality Date  . Ankle surgery Left   . Knee surgery Left     Family History  Problem Relation Age of Onset  . Colon cancer Neg Hx   . Liver disease Neg Hx   . Hypertension Father   . Hypercholesterolemia Sister   . Hypercholesterolemia Father   . Hypertension Mother   . Breast cancer Sister     Social History:  reports that he has never smoked. He has never used smokeless tobacco. He reports that he does not drink alcohol or use illicit drugs.  Allergies: No Known Allergies  Medications: Prior to Admission medications   Medication Sig Start Date End Date Taking? Authorizing Provider  aspirin EC 81 MG tablet Take 81 mg by mouth once as needed for mild pain or moderate pain.   Yes Historical Provider, MD  B Complex-C (SUPER B COMPLEX PO) Take 1 tablet by mouth daily.   Yes Historical Provider, MD  diclofenac sodium (VOLTAREN) 1 % GEL Apply 1 application topically 4 (four) times daily. 01/04/15  Yes Historical Provider, MD  Magnesium 250 MG TABS Take 1 tablet by mouth daily.   Yes Historical Provider, MD  Multiple Vitamins-Minerals (MULTIVITAMIN ADULT PO) Take 5 mLs by mouth 2 (two) times daily. Powder mixture used once to twice daily. Reliv   Yes Historical Provider, MD    Scheduled Meds:  Continuous Infusions: . sodium chloride 50 mL/hr at 01/24/15 1845   PRN Meds:.ondansetron **OR** ondansetron (ZOFRAN) IV     Results for orders placed or performed during the hospital encounter of 01/24/15 (from the past 48 hour(s))  Comprehensive metabolic panel     Status: Abnormal   Collection Time: 01/24/15  3:36 PM  Result Value Ref Range   Sodium 138 135 - 145 mmol/L   Potassium 3.8 3.5 - 5.1 mmol/L   Chloride 103 101 - 111 mmol/L    CO2 27 22 - 32 mmol/L   Glucose, Bld 88 65 - 99 mg/dL   BUN 24 (H) 6 - 20 mg/dL   Creatinine, Ser 0.74 0.61 - 1.24 mg/dL   Calcium 8.6 (L) 8.9 - 10.3 mg/dL   Total Protein 7.2 6.5 - 8.1 g/dL   Albumin 3.8 3.5 - 5.0 g/dL   AST 332 (H) 15 - 41 U/L   ALT 456 (H) 17 - 63 U/L   Alkaline Phosphatase 63 38 - 126 U/L   Total Bilirubin 0.5 0.3 - 1.2 mg/dL   GFR calc non Af Amer >60 >60 mL/min   GFR calc Af Amer >60 >60 mL/min    Comment: (NOTE) The eGFR has been calculated using the CKD EPI equation. This calculation has not been validated in all clinical situations. eGFR's persistently <60 mL/min signify possible Chronic Kidney Disease.    Anion gap 8 5 - 15  CBC with Differential  Status: Abnormal   Collection Time: 01/24/15  3:36 PM  Result Value Ref Range   WBC 5.9 4.0 - 10.5 K/uL   RBC 4.47 4.22 - 5.81 MIL/uL   Hemoglobin 12.8 (L) 13.0 - 17.0 g/dL   HCT 39.5 39.0 - 52.0 %   MCV 88.4 78.0 - 100.0 fL   MCH 28.6 26.0 - 34.0 pg   MCHC 32.4 30.0 - 36.0 g/dL   RDW 13.4 11.5 - 15.5 %   Platelets 210 150 - 400 K/uL   Neutrophils Relative % 68 43 - 77 %   Neutro Abs 4.0 1.7 - 7.7 K/uL   Lymphocytes Relative 23 12 - 46 %   Lymphs Abs 1.4 0.7 - 4.0 K/uL   Monocytes Relative 7 3 - 12 %   Monocytes Absolute 0.4 0.1 - 1.0 K/uL   Eosinophils Relative 2 0 - 5 %   Eosinophils Absolute 0.1 0.0 - 0.7 K/uL   Basophils Relative 0 0 - 1 %   Basophils Absolute 0.0 0.0 - 0.1 K/uL  Troponin I     Status: None   Collection Time: 01/24/15  3:36 PM  Result Value Ref Range   Troponin I <0.03 <0.031 ng/mL    Comment:        NO INDICATION OF MYOCARDIAL INJURY.   CK     Status: Abnormal   Collection Time: 01/24/15  3:36 PM  Result Value Ref Range   [place] [address] (H) 49 - 397 U/L    Comment: RESULTS CONFIRMED BY MANUAL DILUTION  TSH     Status: None   Collection Time: 01/24/15  3:36 PM  Result Value Ref Range   TSH 1.929 0.350 - 4.500 uIU/mL  Brain natriuretic peptide     Status: None    Collection Time: 01/24/15  3:36 PM  Result Value Ref Range   B Natriuretic Peptide 20.0 0.0 - 100.0 pg/mL  Sedimentation rate     Status: Abnormal   Collection Time: 01/24/15  3:36 PM  Result Value Ref Range   Sed Rate 40 (H) 0 - 16 mm/hr  Urinalysis, Routine w reflex microscopic (not at [facility])     Status: Abnormal   Collection Time: 01/24/15  5:48 PM  Result Value Ref Range   Color, Urine YELLOW YELLOW   APPearance CLEAR CLEAR   Specific Gravity, Urine 1.020 1.005 - 1.030   pH 6.0 5.0 - 8.0   Glucose, UA NEGATIVE NEGATIVE mg/dL   Hgb urine dipstick NEGATIVE NEGATIVE   Bilirubin Urine NEGATIVE NEGATIVE   Ketones, ur TRACE (A) NEGATIVE mg/dL   Protein, ur NEGATIVE NEGATIVE mg/dL   Urobilinogen, UA 0.2 0.0 - 1.0 mg/dL   Nitrite NEGATIVE NEGATIVE   Leukocytes, UA NEGATIVE NEGATIVE    Comment: MICROSCOPIC NOT DONE ON URINES WITH NEGATIVE PROTEIN, BLOOD, LEUKOCYTES, NITRITE, OR GLUCOSE <1000 mg/dL.    Studies/Results:     Jacob Hunter, M.D.  Diplomate, Tax adviser of Psychiatry and Neurology ( Neurology). 01/24/2015, 8:24 PM

## 2015-01-24 NOTE — ED Notes (Signed)
Pt reports that he was at work and experienced bilateral leg weakness. Denies dizziness or chest pain. Complains of SOB with exertion. States that weakness has been occurring for one month, but worse today

## 2015-01-25 ENCOUNTER — Encounter (HOSPITAL_COMMUNITY): Payer: Self-pay | Admitting: *Deleted

## 2015-01-25 LAB — COMPREHENSIVE METABOLIC PANEL
ALT: 387 U/L — ABNORMAL HIGH (ref 17–63)
ANION GAP: 9 (ref 5–15)
AST: 257 U/L — ABNORMAL HIGH (ref 15–41)
Albumin: 3.6 g/dL (ref 3.5–5.0)
Alkaline Phosphatase: 64 U/L (ref 38–126)
BUN: 22 mg/dL — ABNORMAL HIGH (ref 6–20)
CALCIUM: 8.7 mg/dL — AB (ref 8.9–10.3)
CO2: 27 mmol/L (ref 22–32)
CREATININE: 0.57 mg/dL — AB (ref 0.61–1.24)
Chloride: 103 mmol/L (ref 101–111)
GLUCOSE: 162 mg/dL — AB (ref 65–99)
Potassium: 4.1 mmol/L (ref 3.5–5.1)
Sodium: 139 mmol/L (ref 135–145)
Total Bilirubin: 0.7 mg/dL (ref 0.3–1.2)
Total Protein: 6.9 g/dL (ref 6.5–8.1)

## 2015-01-25 LAB — CK TOTAL AND CKMB (NOT AT ARMC)
CK, MB: >260 ng/mL — ABNORMAL HIGH (ref 0.5–5.0)
Total CK: 14351 U/L — ABNORMAL HIGH (ref 49–397)

## 2015-01-25 LAB — VITAMIN B12: Vitamin B-12: 524 pg/mL (ref 180–914)

## 2015-01-25 LAB — CBC
HEMATOCRIT: 41.8 % (ref 39.0–52.0)
HEMOGLOBIN: 13.5 g/dL (ref 13.0–17.0)
MCH: 28.8 pg (ref 26.0–34.0)
MCHC: 32.3 g/dL (ref 30.0–36.0)
MCV: 89.1 fL (ref 78.0–100.0)
PLATELETS: 224 10*3/uL (ref 150–400)
RBC: 4.69 MIL/uL (ref 4.22–5.81)
RDW: 13.4 % (ref 11.5–15.5)
WBC: 5.3 10*3/uL (ref 4.0–10.5)

## 2015-01-25 LAB — C-REACTIVE PROTEIN: CRP: 0.8 mg/dL (ref <1.0)

## 2015-01-25 LAB — PROTIME-INR
INR: 1.16 (ref 0.00–1.49)
Prothrombin Time: 15 seconds (ref 11.6–15.2)

## 2015-01-25 MED ORDER — TRAMADOL HCL 50 MG PO TABS
50.0000 mg | ORAL_TABLET | Freq: Four times a day (QID) | ORAL | Status: DC | PRN
  Filled 2015-01-25: qty 1

## 2015-01-25 NOTE — Progress Notes (Signed)
Patient ID: Jacob Hunter, male   DOB: May 26, 1956, 59 y.o.   MRN: [id number]  [place] A. Merlene Laughter, MD     [email or web address]          Jacob Hunter is an 59 y.o. male.   Assessment/Plan: 1. Subacute inflammatory myopathy most likely due to polymyositis.  2. Acute dyspnea likely due to above but should also consider pulmonary embolism. 3. Elevated transaminases due to polymyositis.  FU in office 4 weeks Physical therapy. High-dose steroids IV and then transition to oral on discharge. He will also need to be placed on steroids. Prednisone 79m qd.Medications such as Imuran long-term. Outpatient EMG. I would not recommend muscle biopsy at this time.    Improved symptoms today.   GENERAL: Pleasant in no acute distress.  HEENT: Supple. Atraumatic normocephalic.   ABDOMEN: soft  EXTREMITIES: No edema. Scaling - milld -- of the palmar surfaces of the hands.   BACK: Normal.  SKIN: Normal by inspection - trunk and extremities.    MENTAL STATUS: Alert and oriented. Speech, language and cognition are generally intact. Judgment and insight normal.   CRANIAL NERVES: Pupils are equal, round and reactive to light and accommodation; extra ocular movements are full, there is no significant nystagmus; visual fields are full; upper and lower facial muscles are normal in strength and symmetric, there is no flattening of the nasolabial folds; tongue is midline; uvula is midline; shoulder elevation is normal.  MOTOR: Deltoids 5/5 bilaterally. Hand grip and distal strength of the upper extremities 5. Right hip flexion 3/5 and left 3. Dorsiflexion 5 bilaterally. Bulk and tone are unremarkable.  COORDINATION: Left finger to nose is normal, right finger to nose is normal, No rest tremor; no intention tremor; no postural tremor; no bradykinesia.  REFLEXES: Deep tendon reflexes are symmetrical and normal - except the left knee which is 1+. The patient told that he  has had surgery of the left knee. Babinski reflexes are flexor bilaterally.   SENSATION: Normal to light touch.       Objective: Vital signs in last 24 hours: Temp:  [97.6 F (36.4 C)-98 F (36.7 C)] 98 F (36.7 C) (06/15 1354) Pulse Rate:  [68-77] 71 (06/15 1354) Resp:  [17-20] 20 (06/15 1354) BP: (110-128)/(61-86) 124/72 mmHg (06/15 1354) SpO2:  [96 %-98 %] 98 % (06/15 1354) Weight:  [93.804 kg (206 lb 12.8 oz)] 93.804 kg (206 lb 12.8 oz) (06/14 1934)  Intake/Output from previous day: 06/14 0701 - 06/15 0700 In: 600.5 [I.V.:542.5; IV Piggyback:58] Out: 450 [Urine:450] Intake/Output this shift: Total I/O In: 480 [P.O.:480] Out: 625 [Urine:625] Nutritional status: Diet regular Room service appropriate?: Yes; Fluid consistency:: Thin   Lab Results: Results for orders placed or performed during the hospital encounter of 01/24/15 (from the past 48 hour(s))  Comprehensive metabolic panel     Status: Abnormal   Collection Time: 01/24/15  3:36 PM  Result Value Ref Range   Sodium 138 135 - 145 mmol/L   Potassium 3.8 3.5 - 5.1 mmol/L   Chloride 103 101 - 111 mmol/L   CO2 27 22 - 32 mmol/L   Glucose, Bld 88 65 - 99 mg/dL   BUN 24 (H) 6 - 20 mg/dL   Creatinine, Ser 0.74 0.61 - 1.24 mg/dL   Calcium 8.6 (L) 8.9 - 10.3 mg/dL   Total Protein 7.2 6.5 - 8.1 g/dL   Albumin 3.8 3.5 - 5.0 g/dL   AST 332 (H) 15 - 41 U/L   ALT 456 (  H) 17 - 63 U/L   Alkaline Phosphatase 63 38 - 126 U/L   Total Bilirubin 0.5 0.3 - 1.2 mg/dL   GFR calc non Af Amer >60 >60 mL/min   GFR calc Af Amer >60 >60 mL/min    Comment: (NOTE) The eGFR has been calculated using the CKD EPI equation. This calculation has not been validated in all clinical situations. eGFR's persistently <60 mL/min signify possible Chronic Kidney Disease.    Anion gap 8 5 - 15  CBC with Differential     Status: Abnormal   Collection Time: 01/24/15  3:36 PM  Result Value Ref Range   WBC 5.9 4.0 - 10.5 K/uL   RBC 4.47 4.22 -  5.81 MIL/uL   Hemoglobin 12.8 (L) 13.0 - 17.0 g/dL   HCT 39.5 39.0 - 52.0 %   MCV 88.4 78.0 - 100.0 fL   MCH 28.6 26.0 - 34.0 pg   MCHC 32.4 30.0 - 36.0 g/dL   RDW 13.4 11.5 - 15.5 %   Platelets 210 150 - 400 K/uL   Neutrophils Relative % 68 43 - 77 %   Neutro Abs 4.0 1.7 - 7.7 K/uL   Lymphocytes Relative 23 12 - 46 %   Lymphs Abs 1.4 0.7 - 4.0 K/uL   Monocytes Relative 7 3 - 12 %   Monocytes Absolute 0.4 0.1 - 1.0 K/uL   Eosinophils Relative 2 0 - 5 %   Eosinophils Absolute 0.1 0.0 - 0.7 K/uL   Basophils Relative 0 0 - 1 %   Basophils Absolute 0.0 0.0 - 0.1 K/uL  Troponin I     Status: None   Collection Time: 01/24/15  3:36 PM  Result Value Ref Range   Troponin I <0.03 <0.031 ng/mL    Comment:        NO INDICATION OF MYOCARDIAL INJURY.   CK     Status: Abnormal   Collection Time: 01/24/15  3:36 PM  Result Value Ref Range   [place] [address] (H) 49 - 397 U/L    Comment: RESULTS CONFIRMED BY MANUAL DILUTION  TSH     Status: None   Collection Time: 01/24/15  3:36 PM  Result Value Ref Range   TSH 1.929 0.350 - 4.500 uIU/mL  Brain natriuretic peptide     Status: None   Collection Time: 01/24/15  3:36 PM  Result Value Ref Range   B Natriuretic Peptide 20.0 0.0 - 100.0 pg/mL  Sedimentation rate     Status: Abnormal   Collection Time: 01/24/15  3:36 PM  Result Value Ref Range   Sed Rate 40 (H) 0 - 16 mm/hr  D-dimer, quantitative (not at [facility])     Status: None   Collection Time: 01/24/15  3:36 PM  Result Value Ref Range   D-Dimer, Quant 0.33 0.00 - 0.48 ug/mL-FEU    Comment:        AT THE INHOUSE ESTABLISHED CUTOFF VALUE OF 0.48 ug/mL FEU, THIS ASSAY HAS BEEN DOCUMENTED IN THE LITERATURE TO HAVE A SENSITIVITY AND NEGATIVE PREDICTIVE VALUE OF AT LEAST 98 TO 99%.  THE TEST RESULT SHOULD BE CORRELATED WITH AN ASSESSMENT OF THE CLINICAL PROBABILITY OF DVT / VTE.   Urinalysis, Routine w reflex microscopic (not at [facility])     Status: Abnormal   Collection Time: 01/24/15  5:48  PM  Result Value Ref Range   Color, Urine YELLOW YELLOW   APPearance CLEAR CLEAR   Specific Gravity, Urine 1.020 1.005 - 1.030  pH 6.0 5.0 - 8.0   Glucose, UA NEGATIVE NEGATIVE mg/dL   Hgb urine dipstick NEGATIVE NEGATIVE   Bilirubin Urine NEGATIVE NEGATIVE   Ketones, ur TRACE (A) NEGATIVE mg/dL   Protein, ur NEGATIVE NEGATIVE mg/dL   Urobilinogen, UA 0.2 0.0 - 1.0 mg/dL   Nitrite NEGATIVE NEGATIVE   Leukocytes, UA NEGATIVE NEGATIVE    Comment: MICROSCOPIC NOT DONE ON URINES WITH NEGATIVE PROTEIN, BLOOD, LEUKOCYTES, NITRITE, OR GLUCOSE <1000 mg/dL.  CBC     Status: None   Collection Time: 01/25/15  5:55 AM  Result Value Ref Range   WBC 5.3 4.0 - 10.5 K/uL   RBC 4.69 4.22 - 5.81 MIL/uL   Hemoglobin 13.5 13.0 - 17.0 g/dL   HCT 41.8 39.0 - 52.0 %   MCV 89.1 78.0 - 100.0 fL   MCH 28.8 26.0 - 34.0 pg   MCHC 32.3 30.0 - 36.0 g/dL   RDW 13.4 11.5 - 15.5 %   Platelets 224 150 - 400 K/uL  Comprehensive metabolic panel     Status: Abnormal   Collection Time: 01/25/15  5:55 AM  Result Value Ref Range   Sodium 139 135 - 145 mmol/L   Potassium 4.1 3.5 - 5.1 mmol/L   Chloride 103 101 - 111 mmol/L   CO2 27 22 - 32 mmol/L   Glucose, Bld 162 (H) 65 - 99 mg/dL   BUN 22 (H) 6 - 20 mg/dL   Creatinine, Ser 0.57 (L) 0.61 - 1.24 mg/dL   Calcium 8.7 (L) 8.9 - 10.3 mg/dL   Total Protein 6.9 6.5 - 8.1 g/dL   Albumin 3.6 3.5 - 5.0 g/dL   AST 257 (H) 15 - 41 U/L   ALT 387 (H) 17 - 63 U/L   Alkaline Phosphatase 64 38 - 126 U/L   Total Bilirubin 0.7 0.3 - 1.2 mg/dL   GFR calc non Af Amer >60 >60 mL/min   GFR calc Af Amer >60 >60 mL/min    Comment: (NOTE) The eGFR has been calculated using the CKD EPI equation. This calculation has not been validated in all clinical situations. eGFR's persistently <60 mL/min signify possible Chronic Kidney Disease.    Anion gap 9 5 - 15  Protime-INR     Status: None   Collection Time: 01/25/15  5:55 AM  Result Value Ref Range   Prothrombin Time 15.0 11.6 -  15.2 seconds   INR 1.16 0.00 - 1.49  CK total and CKMB (cardiac)not at [facility]     Status: Abnormal   Collection Time: 01/25/15  5:55 AM  Result Value Ref Range   [place] [address] (H) 49 - 397 U/L    Comment: RESULTS CONFIRMED BY MANUAL DILUTION   CK, MB >260.0 (H) 0.5 - 5.0 ng/mL   Relative Index NOT CALCULATED 0.0 - 2.5    Comment: Performed at [facility]  Vitamin B12     Status: None   Collection Time: 01/25/15  5:55 AM  Result Value Ref Range   Vitamin B-12 524 180 - 914 pg/mL    Comment: (NOTE) This assay is not validated for testing neonatal or myeloproliferative syndrome specimens for Vitamin B12 levels. Performed at [facility]   C-reactive protein     Status: None   Collection Time: 01/25/15  5:55 AM  Result Value Ref Range   CRP 0.8 <1.0 mg/dL    Comment: Performed at [facility]    Lipid Panel No results for input(s): CHOL, TRIG, HDL, CHOLHDL,  VLDL, LDLCALC in the last 72 hours.  Studies/Results:   Medications:  Scheduled Meds: . methylPREDNISolone (SOLU-MEDROL) injection  1,000 mg Intravenous Q24H   Continuous Infusions: . sodium chloride 50 mL/hr at 01/24/15 1845   PRN Meds:.ondansetron **OR** ondansetron (ZOFRAN) IV, traMADol     LOS: 1 day   Jens Siems A. Merlene Laughter, M.D.  Diplomate, Tax adviser of Psychiatry and Neurology ( Neurology).

## 2015-01-25 NOTE — Progress Notes (Signed)
UR chart review completed.  

## 2015-01-25 NOTE — Care Management Note (Signed)
Case Management Note  Patient Details  Name: BRONTE KROPF MRN: [id number] Date of Birth: 17-Nov-1955  Subjective/Objective:                  Pt admitted from home with bilateral leg weakness. Pt lives with his wife and will return home at discharge. Pt has been independent with ADL's.  Action/Plan: Will continue to follow for discharge planning needs. ? Need for home health PT at discharge vs outpt PT.  Expected Discharge Date:  01/27/15               Expected Discharge Plan:  [place]  In-House Referral:  NA  Discharge planning Services  CM Consult  Post Acute Care Choice:    Choice offered to:     DME Arranged:    DME Agency:     HH Arranged:    HH Agency:     Status of Service:  In process, will continue to follow  Medicare Important Message Given:    Date Medicare IM Given:    Medicare IM give by:    Date Additional Medicare IM Given:    Additional Medicare Important Message give by:     If discussed at [place] of Stay Meetings, dates discussed:    Additional Comments:  Joylene Draft, RN 01/25/2015, 12:58 PM

## 2015-01-25 NOTE — Progress Notes (Signed)
Subjective: Jacob Hunter was admitted last night with progressive muscle weakness and an elevated CK of over 18,000. He was felt to have polymyositis by neurology and was started on Solu-Medrol. He notes some improvement in the strength of his right leg this morning. He has been having progressive weakness of the legs over the past month.  Objective: Vital signs in last 24 hours: Filed Vitals:   01/24/15 1847 01/24/15 1934 01/24/15 2137 01/25/15 0357  BP:  114/72 110/61 119/68  Pulse:  68 76 75  Temp: 97.6 F (36.4 C) 97.9 F (36.6 C) 97.7 F (36.5 C)   TempSrc: Oral Oral Oral Oral  Resp:  '20 20 18  '$ Height:  '6\' 1"'$  (1.854 m)    Weight:  206 lb 12.8 oz (93.804 kg)    SpO2:  98% 96% 96%   Weight change:   Intake/Output Summary (Last 24 hours) at 01/25/15 0746 Last data filed at 01/25/15 0536  Gross per 24 hour  Intake  600.5 ml  Output    450 ml  Net  150.5 ml    Physical Exam: Alert and oriented. HEENT unremarkable. Lungs clear. Heart regular with no murmurs. Abdomen soft and nontender with no hepatosplenomegaly. Extremities reveal no edema or palpable muscle tenderness although he complains of muscle discomfort when he raises his arms above his head. Neuro exam reveals marked muscle weakness in his lower extremities with inability to lift up his legs.  Lab Results:    Results for orders placed or performed during the hospital encounter of 01/24/15 (from the past 24 hour(s))  Comprehensive metabolic panel     Status: Abnormal   Collection Time: 01/24/15  3:36 PM  Result Value Ref Range   Sodium 138 135 - 145 mmol/L   Potassium 3.8 3.5 - 5.1 mmol/L   Chloride 103 101 - 111 mmol/L   CO2 27 22 - 32 mmol/L   Glucose, Bld 88 65 - 99 mg/dL   BUN 24 (H) 6 - 20 mg/dL   Creatinine, Ser 0.74 0.61 - 1.24 mg/dL   Calcium 8.6 (L) 8.9 - 10.3 mg/dL   Total Protein 7.2 6.5 - 8.1 g/dL   Albumin 3.8 3.5 - 5.0 g/dL   AST 332 (H) 15 - 41 U/L   ALT 456 (H) 17 - 63 U/L   Alkaline Phosphatase 63  38 - 126 U/L   Total Bilirubin 0.5 0.3 - 1.2 mg/dL   GFR calc non Af Amer >60 >60 mL/min   GFR calc Af Amer >60 >60 mL/min   Anion gap 8 5 - 15  CBC with Differential     Status: Abnormal   Collection Time: 01/24/15  3:36 PM  Result Value Ref Range   WBC 5.9 4.0 - 10.5 K/uL   RBC 4.47 4.22 - 5.81 MIL/uL   Hemoglobin 12.8 (L) 13.0 - 17.0 g/dL   HCT 39.5 39.0 - 52.0 %   MCV 88.4 78.0 - 100.0 fL   MCH 28.6 26.0 - 34.0 pg   MCHC 32.4 30.0 - 36.0 g/dL   RDW 13.4 11.5 - 15.5 %   Platelets 210 150 - 400 K/uL   Neutrophils Relative % 68 43 - 77 %   Neutro Abs 4.0 1.7 - 7.7 K/uL   Lymphocytes Relative 23 12 - 46 %   Lymphs Abs 1.4 0.7 - 4.0 K/uL   Monocytes Relative 7 3 - 12 %   Monocytes Absolute 0.4 0.1 - 1.0 K/uL   Eosinophils Relative 2 0 - 5 %  Eosinophils Absolute 0.1 0.0 - 0.7 K/uL   Basophils Relative 0 0 - 1 %   Basophils Absolute 0.0 0.0 - 0.1 K/uL  Troponin I     Status: None   Collection Time: 01/24/15  3:36 PM  Result Value Ref Range   Troponin I <0.03 <0.031 ng/mL  CK     Status: Abnormal   Collection Time: 01/24/15  3:36 PM  Result Value Ref Range   [place] [address] (H) 49 - 397 U/L  TSH     Status: None   Collection Time: 01/24/15  3:36 PM  Result Value Ref Range   TSH 1.929 0.350 - 4.500 uIU/mL  Brain natriuretic peptide     Status: None   Collection Time: 01/24/15  3:36 PM  Result Value Ref Range   B Natriuretic Peptide 20.0 0.0 - 100.0 pg/mL  Sedimentation rate     Status: Abnormal   Collection Time: 01/24/15  3:36 PM  Result Value Ref Range   Sed Rate 40 (H) 0 - 16 mm/hr  D-dimer, quantitative (not at [facility])     Status: None   Collection Time: 01/24/15  3:36 PM  Result Value Ref Range   D-Dimer, Quant 0.33 0.00 - 0.48 ug/mL-FEU  Urinalysis, Routine w reflex microscopic (not at [facility])     Status: Abnormal   Collection Time: 01/24/15  5:48 PM  Result Value Ref Range   Color, Urine YELLOW YELLOW   APPearance CLEAR CLEAR   Specific Gravity, Urine 1.020  1.005 - 1.030   pH 6.0 5.0 - 8.0   Glucose, UA NEGATIVE NEGATIVE mg/dL   Hgb urine dipstick NEGATIVE NEGATIVE   Bilirubin Urine NEGATIVE NEGATIVE   Ketones, ur TRACE (A) NEGATIVE mg/dL   Protein, ur NEGATIVE NEGATIVE mg/dL   Urobilinogen, UA 0.2 0.0 - 1.0 mg/dL   Nitrite NEGATIVE NEGATIVE   Leukocytes, UA NEGATIVE NEGATIVE  CBC     Status: None   Collection Time: 01/25/15  5:55 AM  Result Value Ref Range   WBC 5.3 4.0 - 10.5 K/uL   RBC 4.69 4.22 - 5.81 MIL/uL   Hemoglobin 13.5 13.0 - 17.0 g/dL   HCT 41.8 39.0 - 52.0 %   MCV 89.1 78.0 - 100.0 fL   MCH 28.8 26.0 - 34.0 pg   MCHC 32.3 30.0 - 36.0 g/dL   RDW 13.4 11.5 - 15.5 %   Platelets 224 150 - 400 K/uL  Comprehensive metabolic panel     Status: Abnormal   Collection Time: 01/25/15  5:55 AM  Result Value Ref Range   Sodium 139 135 - 145 mmol/L   Potassium 4.1 3.5 - 5.1 mmol/L   Chloride 103 101 - 111 mmol/L   CO2 27 22 - 32 mmol/L   Glucose, Bld 162 (H) 65 - 99 mg/dL   BUN 22 (H) 6 - 20 mg/dL   Creatinine, Ser 0.57 (L) 0.61 - 1.24 mg/dL   Calcium 8.7 (L) 8.9 - 10.3 mg/dL   Total Protein 6.9 6.5 - 8.1 g/dL   Albumin 3.6 3.5 - 5.0 g/dL   AST 257 (H) 15 - 41 U/L   ALT 387 (H) 17 - 63 U/L   Alkaline Phosphatase 64 38 - 126 U/L   Total Bilirubin 0.7 0.3 - 1.2 mg/dL   GFR calc non Af Amer >60 >60 mL/min   GFR calc Af Amer >60 >60 mL/min   Anion gap 9 5 - 15  Protime-INR     Status: None  Collection Time: 01/25/15  5:55 AM  Result Value Ref Range   Prothrombin Time 15.0 11.6 - 15.2 seconds   INR 1.16 0.00 - 1.49     ABGS No results for input(s): PHART, PO2ART, TCO2, HCO3 in the last 72 hours.  Invalid input(s): PCO2 CULTURES No results found for this or any previous visit (from the past 240 hour(s)). Studies/Results: Dg Chest 2 View  01/24/2015   CLINICAL DATA:  59 year old male with generalized weakness and shortness of breath on exertion for 1 month. Lower extremity weakness. Initial encounter.  EXAM: CHEST  2  VIEW  COMPARISON:  CT Abdomen and Pelvis for 8,016.  FINDINGS: Upright AP and lateral views of the chest. Normal cardiac size and mediastinal contours. Visualized tracheal air column is within normal limits. The lungs are clear. No pneumothorax or effusion. No acute osseous abnormality identified.  IMPRESSION: Negative, no acute cardiopulmonary abnormality.   Electronically Signed   By: Genevie Ann M.D.   On: 01/24/2015 16:26   Mr Thoracic Spine Wo Contrast  01/24/2015   CLINICAL DATA:  Bilateral leg weakness.  EXAM: MRI THORACIC SPINE WITHOUT CONTRAST  TECHNIQUE: Multiplanar, multisequence MR imaging of the thoracic spine was performed. No intravenous contrast was administered.  COMPARISON:  None.  FINDINGS: Normal thoracic alignment.  Negative for fracture or mass.  Spinal cord signal is normal.  No cord compression or cord lesion.  No evidence of spinal infection  Mild thoracic disc degeneration with mild disc space narrowing and disc desiccation T9-10 and T10-11. Negative for disc protrusion or spinal stenosis.  Small hepatic cysts noted.  IMPRESSION: Mild thoracic disc degeneration. No cord compression or neural impingement.   Electronically Signed   By: Franchot Gallo M.D.   On: 01/24/2015 17:47   Mr Lumbar Spine Wo Contrast  01/24/2015   CLINICAL DATA:  Bilateral leg weakness  EXAM: MRI LUMBAR SPINE WITHOUT CONTRAST  TECHNIQUE: Multiplanar, multisequence MR imaging of the lumbar spine was performed. No intravenous contrast was administered.  COMPARISON:  Lumbar MRI 11/08/2003  FINDINGS: Normal lumbar alignment. Negative for fracture or mass. No evidence of spinal infection. Conus medullaris is normal and terminates at mid L1  L1-2:  Negative  L2-3: Mild disc and facet degeneration causing mild spinal stenosis. Slight anterior slip L2-3. These features have progressed in the interval.  L3-4: Diffuse disc bulging with associated endplate osteophyte formation. Bilateral facet and ligamentum flavum hypertrophy.  Moderate spinal stenosis which has progressed significantly in the interval. Mild foraminal narrowing bilaterally  L4-5: Central disc protrusion is slightly improved from the prior study. There is bilateral facet hypertrophy and moderate spinal stenosis. Spinal stenosis has progressed in the interval. Subarticular and foraminal stenosis bilaterally  L5-S1: Small central disc protrusion unchanged from the prior study. No significant neural impingement.  IMPRESSION: Mild spinal stenosis L2-3  Moderate spinal stenosis L3-4 with progression from 2005.  L4-5 central disc protrusion with moderate spinal stenosis which has progressed in the interval.  Small central disc protrusion L5-S1 without neural impingement.   Electronically Signed   By: Franchot Gallo M.D.   On: 01/24/2015 17:42   Micro Results: No results found for this or any previous visit (from the past 240 hour(s)). Studies/Results: Dg Chest 2 View  01/24/2015   CLINICAL DATA:  59 year old male with generalized weakness and shortness of breath on exertion for 1 month. Lower extremity weakness. Initial encounter.  EXAM: CHEST  2 VIEW  COMPARISON:  CT Abdomen and Pelvis for 8,016.  FINDINGS: Upright  AP and lateral views of the chest. Normal cardiac size and mediastinal contours. Visualized tracheal air column is within normal limits. The lungs are clear. No pneumothorax or effusion. No acute osseous abnormality identified.  IMPRESSION: Negative, no acute cardiopulmonary abnormality.   Electronically Signed   By: Genevie Ann M.D.   On: 01/24/2015 16:26   Mr Thoracic Spine Wo Contrast  01/24/2015   CLINICAL DATA:  Bilateral leg weakness.  EXAM: MRI THORACIC SPINE WITHOUT CONTRAST  TECHNIQUE: Multiplanar, multisequence MR imaging of the thoracic spine was performed. No intravenous contrast was administered.  COMPARISON:  None.  FINDINGS: Normal thoracic alignment.  Negative for fracture or mass.  Spinal cord signal is normal.  No cord compression or cord  lesion.  No evidence of spinal infection  Mild thoracic disc degeneration with mild disc space narrowing and disc desiccation T9-10 and T10-11. Negative for disc protrusion or spinal stenosis.  Small hepatic cysts noted.  IMPRESSION: Mild thoracic disc degeneration. No cord compression or neural impingement.   Electronically Signed   By: Franchot Gallo M.D.   On: 01/24/2015 17:47   Mr Lumbar Spine Wo Contrast  01/24/2015   CLINICAL DATA:  Bilateral leg weakness  EXAM: MRI LUMBAR SPINE WITHOUT CONTRAST  TECHNIQUE: Multiplanar, multisequence MR imaging of the lumbar spine was performed. No intravenous contrast was administered.  COMPARISON:  Lumbar MRI 11/08/2003  FINDINGS: Normal lumbar alignment. Negative for fracture or mass. No evidence of spinal infection. Conus medullaris is normal and terminates at mid L1  L1-2:  Negative  L2-3: Mild disc and facet degeneration causing mild spinal stenosis. Slight anterior slip L2-3. These features have progressed in the interval.  L3-4: Diffuse disc bulging with associated endplate osteophyte formation. Bilateral facet and ligamentum flavum hypertrophy. Moderate spinal stenosis which has progressed significantly in the interval. Mild foraminal narrowing bilaterally  L4-5: Central disc protrusion is slightly improved from the prior study. There is bilateral facet hypertrophy and moderate spinal stenosis. Spinal stenosis has progressed in the interval. Subarticular and foraminal stenosis bilaterally  L5-S1: Small central disc protrusion unchanged from the prior study. No significant neural impingement.  IMPRESSION: Mild spinal stenosis L2-3  Moderate spinal stenosis L3-4 with progression from 2005.  L4-5 central disc protrusion with moderate spinal stenosis which has progressed in the interval.  Small central disc protrusion L5-S1 without neural impingement.   Electronically Signed   By: Franchot Gallo M.D.   On: 01/24/2015 17:42   Medications:  I have reviewed the  patient's current medications Scheduled Meds: . methylPREDNISolone (SOLU-MEDROL) injection  1,000 mg Intravenous Q24H   Continuous Infusions: . sodium chloride 50 mL/hr at 01/24/15 1845   PRN Meds:.ondansetron **OR** ondansetron (ZOFRAN) IV, traMADol   Assessment/Plan: #1. Polymyositis. Methylprednisolone per neurology. Will discuss with neurology. #2. History of increased LFTs likely due to #1. Previously thoroughly investigated by GI. Active Problems:   Bilateral leg weakness   Elevated CPK     LOS: 1 day   Jacob Hunter 01/25/2015, 7:46 AM

## 2015-01-26 LAB — CK: Total CK: 6512 U/L — ABNORMAL HIGH (ref 49–397)

## 2015-01-26 LAB — HIV ANTIBODY (ROUTINE TESTING W REFLEX): HIV SCREEN 4TH GENERATION: NONREACTIVE

## 2015-01-26 LAB — RPR: RPR Ser Ql: NONREACTIVE

## 2015-01-26 LAB — ANTINUCLEAR ANTIBODIES, IFA: ANA Ab, IFA: NEGATIVE

## 2015-01-26 LAB — HOMOCYSTEINE: Homocysteine: 7.9 umol/L (ref 0.0–15.0)

## 2015-01-26 MED ORDER — PREDNISONE 20 MG PO TABS: ORAL_TABLET | ORAL | Status: DC

## 2015-01-26 NOTE — Progress Notes (Signed)
Removed pt IV, reviewed discharge instructions and new prescription with pt and family.  Answered all questions at this time.

## 2015-01-26 NOTE — Progress Notes (Signed)
Subjective: Raysean denies any muscle pain. He is able to lift his legs well now and is able to stand independently.  Objective: Vital signs in last 24 hours: Filed Vitals:   01/25/15 0357 01/25/15 1354 01/25/15 2140 01/26/15 0614  BP: 119/68 124/72 135/68 132/67  Pulse: 75 71 80 67  Temp:  98 F (36.7 C) 97.4 F (36.3 C) 97.6 F (36.4 C)  TempSrc: Oral Oral Oral Oral  Resp: '18 20 20 20  '$ Height:      Weight:      SpO2: 96% 98% 96% 96%   Weight change:   Intake/Output Summary (Last 24 hours) at 01/26/15 0750 Last data filed at 01/26/15 0552  Gross per 24 hour  Intake   1941 ml  Output    925 ml  Net   1016 ml    Physical Exam: Alert and oriented. No distress. Lungs clear. Heart regular with no murmurs abdomen soft and nontender. Extremities reveal no muscle tenderness. He has significantly improved strength in his lower extremities.  Lab Results:   No results found for this or any previous visit (from the past 24 hour(s)).   ABGS No results for input(s): PHART, PO2ART, TCO2, HCO3 in the last 72 hours.  Invalid input(s): PCO2 CULTURES No results found for this or any previous visit (from the past 240 hour(s)). Studies/Results: Dg Chest 2 View  01/24/2015   CLINICAL DATA:  59 year old male with generalized weakness and shortness of breath on exertion for 1 month. Lower extremity weakness. Initial encounter.  EXAM: CHEST  2 VIEW  COMPARISON:  CT Abdomen and Pelvis for 8,016.  FINDINGS: Upright AP and lateral views of the chest. Normal cardiac size and mediastinal contours. Visualized tracheal air column is within normal limits. The lungs are clear. No pneumothorax or effusion. No acute osseous abnormality identified.  IMPRESSION: Negative, no acute cardiopulmonary abnormality.   Electronically Signed   By: Genevie Ann M.D.   On: 01/24/2015 16:26   Mr Thoracic Spine Wo Contrast  01/24/2015   CLINICAL DATA:  Bilateral leg weakness.  EXAM: MRI THORACIC SPINE WITHOUT CONTRAST   TECHNIQUE: Multiplanar, multisequence MR imaging of the thoracic spine was performed. No intravenous contrast was administered.  COMPARISON:  None.  FINDINGS: Normal thoracic alignment.  Negative for fracture or mass.  Spinal cord signal is normal.  No cord compression or cord lesion.  No evidence of spinal infection  Mild thoracic disc degeneration with mild disc space narrowing and disc desiccation T9-10 and T10-11. Negative for disc protrusion or spinal stenosis.  Small hepatic cysts noted.  IMPRESSION: Mild thoracic disc degeneration. No cord compression or neural impingement.   Electronically Signed   By: Franchot Gallo M.D.   On: 01/24/2015 17:47   Mr Lumbar Spine Wo Contrast  01/24/2015   CLINICAL DATA:  Bilateral leg weakness  EXAM: MRI LUMBAR SPINE WITHOUT CONTRAST  TECHNIQUE: Multiplanar, multisequence MR imaging of the lumbar spine was performed. No intravenous contrast was administered.  COMPARISON:  Lumbar MRI 11/08/2003  FINDINGS: Normal lumbar alignment. Negative for fracture or mass. No evidence of spinal infection. Conus medullaris is normal and terminates at mid L1  L1-2:  Negative  L2-3: Mild disc and facet degeneration causing mild spinal stenosis. Slight anterior slip L2-3. These features have progressed in the interval.  L3-4: Diffuse disc bulging with associated endplate osteophyte formation. Bilateral facet and ligamentum flavum hypertrophy. Moderate spinal stenosis which has progressed significantly in the interval. Mild foraminal narrowing bilaterally  L4-5: Central disc  protrusion is slightly improved from the prior study. There is bilateral facet hypertrophy and moderate spinal stenosis. Spinal stenosis has progressed in the interval. Subarticular and foraminal stenosis bilaterally  L5-S1: Small central disc protrusion unchanged from the prior study. No significant neural impingement.  IMPRESSION: Mild spinal stenosis L2-3  Moderate spinal stenosis L3-4 with progression from 2005.   L4-5 central disc protrusion with moderate spinal stenosis which has progressed in the interval.  Small central disc protrusion L5-S1 without neural impingement.   Electronically Signed   By: Franchot Gallo M.D.   On: 01/24/2015 17:42   Micro Results: No results found for this or any previous visit (from the past 240 hour(s)). Studies/Results: Dg Chest 2 View  01/24/2015   CLINICAL DATA:  59 year old male with generalized weakness and shortness of breath on exertion for 1 month. Lower extremity weakness. Initial encounter.  EXAM: CHEST  2 VIEW  COMPARISON:  CT Abdomen and Pelvis for 8,016.  FINDINGS: Upright AP and lateral views of the chest. Normal cardiac size and mediastinal contours. Visualized tracheal air column is within normal limits. The lungs are clear. No pneumothorax or effusion. No acute osseous abnormality identified.  IMPRESSION: Negative, no acute cardiopulmonary abnormality.   Electronically Signed   By: Genevie Ann M.D.   On: 01/24/2015 16:26   Mr Thoracic Spine Wo Contrast  01/24/2015   CLINICAL DATA:  Bilateral leg weakness.  EXAM: MRI THORACIC SPINE WITHOUT CONTRAST  TECHNIQUE: Multiplanar, multisequence MR imaging of the thoracic spine was performed. No intravenous contrast was administered.  COMPARISON:  None.  FINDINGS: Normal thoracic alignment.  Negative for fracture or mass.  Spinal cord signal is normal.  No cord compression or cord lesion.  No evidence of spinal infection  Mild thoracic disc degeneration with mild disc space narrowing and disc desiccation T9-10 and T10-11. Negative for disc protrusion or spinal stenosis.  Small hepatic cysts noted.  IMPRESSION: Mild thoracic disc degeneration. No cord compression or neural impingement.   Electronically Signed   By: Franchot Gallo M.D.   On: 01/24/2015 17:47   Mr Lumbar Spine Wo Contrast  01/24/2015   CLINICAL DATA:  Bilateral leg weakness  EXAM: MRI LUMBAR SPINE WITHOUT CONTRAST  TECHNIQUE: Multiplanar, multisequence MR imaging  of the lumbar spine was performed. No intravenous contrast was administered.  COMPARISON:  Lumbar MRI 11/08/2003  FINDINGS: Normal lumbar alignment. Negative for fracture or mass. No evidence of spinal infection. Conus medullaris is normal and terminates at mid L1  L1-2:  Negative  L2-3: Mild disc and facet degeneration causing mild spinal stenosis. Slight anterior slip L2-3. These features have progressed in the interval.  L3-4: Diffuse disc bulging with associated endplate osteophyte formation. Bilateral facet and ligamentum flavum hypertrophy. Moderate spinal stenosis which has progressed significantly in the interval. Mild foraminal narrowing bilaterally  L4-5: Central disc protrusion is slightly improved from the prior study. There is bilateral facet hypertrophy and moderate spinal stenosis. Spinal stenosis has progressed in the interval. Subarticular and foraminal stenosis bilaterally  L5-S1: Small central disc protrusion unchanged from the prior study. No significant neural impingement.  IMPRESSION: Mild spinal stenosis L2-3  Moderate spinal stenosis L3-4 with progression from 2005.  L4-5 central disc protrusion with moderate spinal stenosis which has progressed in the interval.  Small central disc protrusion L5-S1 without neural impingement.   Electronically Signed   By: Franchot Gallo M.D.   On: 01/24/2015 17:42   Medications:  I have reviewed the patient's current medications Scheduled Meds: .  methylPREDNISolone (SOLU-MEDROL) injection  1,000 mg Intravenous Q24H   Continuous Infusions: . sodium chloride 50 mL/hr at 01/25/15 1752   PRN Meds:.ondansetron **OR** ondansetron (ZOFRAN) IV, traMADol   Assessment/Plan: #1. Polymyositis. Today's CPK is pending. Yesterday's CPK was down to 14,351 from 18,172 after his first dose of Solu-Medrol. He received another dose of Solu-Medrol 1000 mg yesterday. Case has been discussed with Dr. Corey Skains. #2. Elevated transaminases. Improved. SGOT decreased from  332-257 and SGPT improved from 456-387. Active Problems:   Bilateral leg weakness   Elevated CPK     LOS: 2 days   Jacob Hunter 01/26/2015, 7:50 AM

## 2015-01-26 NOTE — Evaluation (Signed)
Physical Therapy Evaluation Patient Details Name: Jacob Hunter MRN: [id number] DOB: [birth year] Today's Date: 01/26/2015   History of Present Illness  This is a 59 year old man who gives a one-month history of aggressive weakness of both his legs more on the right than the left. He denies any injury to his back and he denies any back pain. Today, he came to the emergency room because he could not stand up from a chair and he became very weak. He also described dyspnea on exertion because he said that he was having to exert himself just trying to walk and move his legs. He denies any chest pain or palpitations. There is no swelling of his legs. He denies any pain in his legs. There is no skin rash. There is no fever. He has been investigated by gastroenterology for elevated AST and ALT levels. Evaluation in the emergency room shows significantly elevated AST and ALT levels but also significantly elevated CPK over 18,000. He is now being admitted for further investigation.  Pt is found to have polymyositis and has been receiving high dose steroids.    Clinical Impression   Pt was seen for evaluation.  He is currently returning to baseline with very mild weakness in the hip musculature bilaterally.  He was given a written home exercise program and theraband (red was the only color available).  He would like to pursue OP PT at d/c.    Follow Up Recommendations Outpatient PT    Equipment Recommendations  None recommended by PT    Recommendations for Other Services   none    Precautions / Restrictions Precautions Precautions: None Restrictions Weight Bearing Restrictions: No      Mobility  Bed Mobility Overal bed mobility: Independent                Transfers Overall transfer level: Independent                  Ambulation/Gait Ambulation/Gait assistance: Independent Ambulation Distance (Feet): 300 Feet Assistive device: None Gait Pattern/deviations: WFL(Within  Functional Limits)   Gait velocity interpretation: at or above normal speed for age/gender     Stairs       Independent, alternating gait                  Balance Overall balance assessment: Independent                               Standardized Balance Assessment Standardized Balance Assessment : Berg Balance Test Berg Balance Test Sit to Stand: Able to stand without using hands and stabilize independently Standing Unsupported: Able to stand safely 2 minutes Sitting with Back Unsupported but Feet Supported on Floor or Stool: Able to sit safely and securely 2 minutes Stand to Sit: Sits safely with minimal use of hands Transfers: Able to transfer safely, minor use of hands Standing Unsupported with Eyes Closed: Able to stand 10 seconds safely Standing Ubsupported with Feet Together: Able to place feet together independently and stand 1 minute safely From Standing, Reach Forward with Outstretched Arm: Can reach confidently >25 cm (10") From Standing Position, Pick up Object from Floor: Able to pick up shoe safely and easily From Standing Position, Turn to Look Behind Over each Shoulder: Looks behind from both sides and weight shifts well Turn 360 Degrees: Able to turn 360 degrees safely in 4 seconds or less Standing Unsupported, Alternately Place Feet on Step/Stool: Able to  stand independently and safely and complete 8 steps in 20 seconds Standing Unsupported, One Foot in Front: Able to place foot tandem independently and hold 30 seconds Standing on One Leg: Able to lift leg independently and hold 5-10 seconds Total Score: 55         Pertinent Vitals/Pain Pain Assessment: No/denies pain    Home Living Family/patient expects to be discharged to:: Private residence Living Arrangements: Spouse/significant other Available Help at Discharge: Family;Available 24 hours/day Type of Home: House       Home Layout: One level Home Equipment: None      Prior  Function Level of Independence: Independent                       Extremity/Trunk Assessment               Lower Extremity Assessment:  (hip strength 4-/5 bilaterally)         Communication   Communication: No difficulties  Cognition Arousal/Alertness: Awake/alert Behavior During Therapy: WFL for tasks assessed/performed Overall Cognitive Status: Within Functional Limits for tasks assessed                      General Comments      Exercises        Assessment/Plan    PT Assessment All further PT needs can be met in the next venue of care  PT Diagnosis Other (comment) (mild weakness in hips)   PT Problem List    PT Treatment Interventions     PT Goals (Current goals can be found in the Care Plan section) Acute Rehab PT Goals PT Goal Formulation: All assessment and education complete, DC therapy    Frequency     Barriers to discharge        Co-evaluation               End of Session   Activity Tolerance: Patient tolerated treatment well Patient left: in bed;with call bell/phone within reach;with family/visitor present Nurse Communication: Mobility status         Time: 0826-0901 PT Time Calculation (min) (ACUTE ONLY): 35 min   Charges:   PT Evaluation $Initial PT Evaluation Tier I: 1 Procedure PT Treatments $Therapeutic Exercise: 8-22 mins   PT G CodesSable Feil  PT 01/26/2015, 9:31 AM [phone]

## 2015-01-26 NOTE — Care Management Note (Signed)
Case Management Note  Patient Details  Name: Jacob Hunter MRN: [id number] Date of Birth: September 27, 1955  Subjective/Objective:                    Action/Plan:   Expected Discharge Date:  01/27/15               Expected Discharge Plan:  [place]  In-House Referral:  NA  Discharge planning Services  CM Consult  Post Acute Care Choice:  NA Choice offered to:  NA  DME Arranged:    DME Agency:     HH Arranged:    HH Agency:     Status of Service:  Completed, signed off  Medicare Important Message Given:    Date Medicare IM Given:    Medicare IM give by:    Date Additional Medicare IM Given:    Additional Medicare Important Message give by:     If discussed at [place] of Stay Meetings, dates discussed:    Additional Comments: Pt discharging home today with outpt PT. Referral form faxed to [facility] and they will call pt with appt time. No further CM needs noted. Christinia Gully [place], RN 01/26/2015, 12:18 PM

## 2015-02-02 ENCOUNTER — Ambulatory Visit (HOSPITAL_COMMUNITY): Payer: Managed Care, Other (non HMO) | Attending: Internal Medicine

## 2015-02-02 ENCOUNTER — Encounter (HOSPITAL_COMMUNITY): Payer: Self-pay

## 2015-02-02 DIAGNOSIS — Z789 Other specified health status: Secondary | ICD-10-CM

## 2015-02-02 DIAGNOSIS — M6281 Muscle weakness (generalized): Secondary | ICD-10-CM | POA: Diagnosis not present

## 2015-02-02 DIAGNOSIS — R5381 Other malaise: Secondary | ICD-10-CM | POA: Diagnosis not present

## 2015-02-02 DIAGNOSIS — R2681 Unsteadiness on feet: Secondary | ICD-10-CM

## 2015-02-02 DIAGNOSIS — R52 Pain, unspecified: Secondary | ICD-10-CM | POA: Diagnosis not present

## 2015-02-02 NOTE — Patient Instructions (Addendum)
Quads / HF, Prone With Partner    Lie, head on arms. Have partner gently bend and raise leg. Hold ___ seconds.  Repeat ___ times per session. Do ___ sessions per day.  Copyright  VHI. All rights reserved.  Bracing With Heel Slides (Supine)   With neutral spine, tighten pelvic floor and abdominals and hold. Alternating legs, slide heel to bottom. Repeat 15x times. Do 2 times a day. AAROM with strap or rope.   Copyright  VHI. All rights reserved.

## 2015-02-03 NOTE — Therapy (Signed)
[place], [place], [address] Phone: [phone]   Fax:  [phone]  Physical Therapy Evaluation  Patient Details  Name: Jacob Hunter MRN: [id number] Date of Birth: 01-22-1956 Referring Provider:  Asencion Noble, MD  Encounter Date: 02/02/2015      PT End of Session - 02/02/15 1154    Visit Number 1   Number of Visits 16  unlimited; [employer] to reevaluated after 90 days.    Date for PT Re-Evaluation 04/04/15   Authorization Type [employer]   Authorization Time Period 02/02/2015-04/04/15   Authorization - Visit Number 1   Authorization - Number of Visits 16   PT Start Time 0849   PT Stop Time 0938   PT Time Calculation (min) 49 min   Activity Tolerance Patient tolerated treatment well;Patient limited by fatigue   Behavior During Therapy [facility] for tasks assessed/performed      Past Medical History  Diagnosis Date  . Hypercholesteremia   . Knee pain   . Arthritis     Past Surgical History  Procedure Laterality Date  . Ankle surgery Left   . Knee surgery Left     There were no vitals filed for this visit.  Visit Diagnosis:  Muscle weakness of lower extremity - Plan: PT plan of care cert/re-cert  Alteration in performance of activities of daily living - Plan: PT plan of care cert/re-cert  Pain, acute - Plan: PT plan of care cert/re-cert  Unsteady gait - Plan: PT plan of care cert/re-cert      Subjective Assessment - 02/02/15 0856    Subjective Pt began to feel especially weak in arm and legs about three months ago with gradually worsening symptoms. Last Monday 6/14, pt was unable to lift either leg and reported to [facility] via EMS. Pt found to have elevated CPK enzymes in the "18,000's" and immediately put on regimen of corticosteroids. Pt reports today after followup with MD Asencion Noble.    Pertinent History History of L knee scope (07/2013) and L ankle reconstructive surgeries (2012ish) per  patient.     Limitations Lifting;Standing;Walking;House hold activities   How long can you sit comfortably? Unlimited    How long can you stand comfortably? Unlimited    How long can you walk comfortably? 5 Minutes   Patient Stated Goals Return of normal strength in BLE (especailly hip flexion).   Currently in Pain? Yes   Pain Score 0-No pain  no pain while moving.   Pain Location --  bilat thighs    Pain Descriptors / Indicators Nagging   Pain Type Acute pain   Pain Onset 1 to 4 weeks ago   Pain Frequency Intermittent  With any active movement.    Aggravating Factors  Movement, excessive activity.    Pain Relieving Factors Resting, sleeping in bed.    Effect of Pain on Daily Activities Very limiting, not able to work at the moment.    Multiple Pain Sites Yes            [facility] PT Assessment - 02/02/15 0908    Assessment   Hand Dominance Right   Next MD Visit 06/23  Asencion Noble    Prior Therapy None for this issue; has had pt for L ankle.    Balance Screen   Has the patient fallen in the past 6 months Yes   How many times? 2   Has the patient had a decrease in activity level because of  a fear of falling?  Yes   Is the patient reluctant to leave their home because of a fear of falling?  No   Home Environment   Living Environment Private residence   Living Arrangements Children;Spouse/significant other   Available Help at Discharge Family   Type of [place] Two level;Able to live on main level with bedroom/bathroom   Alternate Level Stairs-Number of Steps 13    Alternate Level Stairs-Rails Right   Home Equipment None   Prior Function   Level of Independence Independent   Vocation Full time employment  40 hours, very active job   Biomedical scientist walking, movement, runs two machines, bending, mild repetitive lifting    Leisure hunting, golf, beach going, fishing   Cognition   Overall Cognitive Status Within Functional Limits for tasks assessed    Attention Focused   Observation/Other Assessments   Observations WNL   Skin Integrity WNL   Focus on Therapeutic Outcomes (FOTO)  28   Sensation   Light Touch Appears Intact   Coordination   Gross Motor Movements are Fluid and Coordinated No   Functional Tests   Functional tests Sit to Stand  unable to perform without BUE assistance.    ROM / Strength   AROM / PROM / Strength Strength   Strength   Overall Strength Deficits   Strength Assessment Site Hip;Knee;Ankle   Right/Left Hip Right;Left   Right Hip Flexion 3/5   Right Hip Extension 3/5   Right Hip External Rotation  4   Right Hip ABduction 4/5   Right Hip ADduction 4/5   Left Hip Flexion 3/5   Left Hip Extension 3/5   Left Hip External Rotation  4   Left Hip ABduction 4/5   Left Hip ADduction 4/5   Right/Left Knee Right;Left   Right Knee Flexion 3+/5   Right Knee Extension 5/5   Left Knee Flexion 3+/5   Left Knee Extension 5/5   Right/Left Ankle Right;Left   Right Ankle Dorsiflexion 5/5   Left Ankle Dorsiflexion 5/5   Special Tests    Special Tests Hip Special Tests  + painful Ely's test bilat   Knee Special tests  other   Bed Mobility   Bed Mobility Rolling Right;Rolling Left;Supine to Sit;Sit to Supine;Scooting to [facility]   Rolling Right 6: Modified independent (Device/Increase time)   Rolling Left 6: Modified independent (Device/Increase time)   Supine to Sit 6: Modified independent (Device/Increase time)   Sit to Supine 6: Modified independent (Device/Increase time)   Scooting to [facility] 6: Modified independent (Device/Increase time)   Ambulation/Gait   Ambulation/Gait Yes   Assistive device None   Gait Pattern Step-through pattern;Wide base of support  genu varum bilat noted   Ambulation Surface Level                   [facility] Adult PT Treatment/Exercise - 02/02/15 0908    Knee/Hip Exercises: Stretches   Quad Stretch Both;2 reps;30 seconds  aarom c strap   Knee/Hip Exercises: Supine   Heel Slides  15 reps;Both;AAROM;Strengthening  self assist with rope.                 PT Education - 02/02/15 1153    Education provided Yes   Education Details Being careful not to do HEP too much to the point of fatigue.    Person(s) Educated Patient   Methods Explanation   Comprehension Verbalized understanding  PT Short Term Goals - 02/02/15 1216    PT SHORT TERM GOAL #1   Title Pt will tolerate 2 hours of household tasks without increase in pain, weakness or fatigue.    Baseline 45 minutes    Time 4   Period Weeks   Status New   PT SHORT TERM GOAL #2   Title Pt will demonstrate independence in performance and compliance in HEP with MinA or less from family.    Baseline Unfamiliar with HEP    Time 2   Period Weeks   Status New   PT SHORT TERM GOAL #3   Title Pt will demonstrate ability to perform sit-to/from stand from elevated surface without use of hands 3x.    Baseline 0x without hands.    Time 4   Period Weeks   Status New           PT Long Term Goals - 02/02/15 1220    PT LONG TERM GOAL #1   Title Pt will demonstrate tolerance of 5 hours of household activites without exacerbation of symptms.    Baseline 45 minutes    Time 8   Period Weeks   Status New   PT LONG TERM GOAL #2   Title Pt will tolerate performance of 5 steps, with step-over gait and 1 HR to demonstrate ability to safely enter/exit home.    PT LONG TERM GOAL #3   Title Pt will demonstrate ability to ambulated 1530f in 6 minutes without AD or LOB to demonstrate return to PLOF in mobility.    Baseline 5 minutes of walking with frequent LOB.    Time 8   Period Weeks   Status New               Plan - 02/02/15 1209    Clinical Impression Statement Pt has continued to regain functional strength with onset of pharmochological intervention. Pt remains moderately weak in lower adn upper extremities with continued poor activity tolerance. Pt will benefit from skilled PT intervention  to improve strength, activity tolerance, balance, and pain, to return to PLOF in ADL, IADL and mobility, with eventual return to work.    Pt will benefit from skilled therapeutic intervention in order to improve on the following deficits Abnormal gait;Decreased coordination;Decreased range of motion;Difficulty walking;Decreased endurance;Decreased activity tolerance;Pain;Decreased balance;Impaired flexibility;Decreased mobility;Decreased strength   Rehab Potential Good   PT Frequency 2x / week   PT Duration 8 weeks   PT Treatment/Interventions Balance training;Passive range of motion;Gait training;Stair training;Functional mobility training;Therapeutic activities;Therapeutic exercise;Manual techniques;Patient/family education   PT Next Visit Plan Gather addional objective data on balance deficits.    PT Home Exercise Plan Given prone quads stretch and supine heel slides.    Consulted and Agree with Plan of Care Patient         Problem List Patient Active Problem List   Diagnosis Date Noted  . Bilateral leg weakness 01/24/2015  . Elevated CPK 01/24/2015  . Transaminitis 10/24/2014    Kadyn Guild C 02/03/2015, 8:36 AM  8:36 AM  AEtta Grandchild PT, DPT [place] License # [id number]      [facility] [address] [place] [address]Phone: [phone]  Fax:  [phone]

## 2015-02-04 NOTE — Discharge Summary (Signed)
Physician Discharge Summary  Jacob Hunter EUM:[id number] DOB: 1956/07/03 DOA: 01/24/2015   Admit date: 01/24/2015 Discharge date: 02/04/2015  Discharge Diagnoses: Polymyositis #2. Elevated LFTs. #3. Lumbar disc disease with mild to moderate spinal stenosis. #4. Thoracic disc disease. Active Problems:   Bilateral leg weakness   Elevated CPK    Wt Readings from Last 3 Encounters:  01/24/15 206 lb 12.8 oz (93.804 kg)  01/24/15 207 lb (93.895 kg)  11/25/14 222 lb 12.8 oz (101.061 kg)     Hospital Course:  This patient is a 59 year old male who presented with leg weakness. He had experienced minimal muscle discomfort. His creatinine kinase was elevated 18,172. He was seen in consultation by neurology and was diagnosed with polymyositis. He was treated with Solu-Medrol 1 g on 2 consecutive days and had a decrease in his creatinine kinase to 14,351 and then 6512. His symptoms significantly improved. He was able to walk independently. Upper extremity strength has been normal. He also underwent MRIs of his lumbar and thoracic spines.  His lumbar spine MRI revealed degenerative disc changes with mild to moderate spinal stenosis. His thoracic MRI revealed mild thoracic disc disease. Chest x-ray revealed no acute process. Sedimentation rate was 40. CRP was 0.8. He also had elevated transaminases with an SGOT of 332 which improved to 257 following treatment. SGPT improved from 456-387. He had previously undergone evaluation of elevated transaminases by gastroenterology.  With the symptomatic improvement he is felt to be stable for discharge on prednisone 80 mg daily. He'll be seen in follow-up in my office in one week and will have follow-up with neurology. He will be treated with physical therapy as well.  Condition at discharge is much improved.   Discharge Instructions     Medication List    STOP taking these medications        aspirin EC 81 MG tablet     diclofenac sodium 1 % Gel   Commonly known as:  VOLTAREN     Magnesium 250 MG Tabs     SUPER B COMPLEX PO      TAKE these medications        MULTIVITAMIN ADULT PO  Take 5 mLs by mouth 2 (two) times daily. Powder mixture used once to twice daily. Reliv  Notes to Patient:  Resume medication as directed     predniSONE 20 MG tablet  Commonly known as:  DELTASONE  Take 4 a day at one time  Notes to Patient:  *New Prescription*  Take as directed          Jacob Hunter 02/04/2015

## 2015-02-14 ENCOUNTER — Ambulatory Visit (HOSPITAL_COMMUNITY): Payer: Managed Care, Other (non HMO) | Attending: Internal Medicine | Admitting: Physical Therapy

## 2015-02-14 ENCOUNTER — Telehealth (HOSPITAL_COMMUNITY): Payer: Self-pay | Admitting: Physical Therapy

## 2015-02-14 DIAGNOSIS — R5381 Other malaise: Secondary | ICD-10-CM | POA: Insufficient documentation

## 2015-02-14 DIAGNOSIS — M6281 Muscle weakness (generalized): Secondary | ICD-10-CM | POA: Insufficient documentation

## 2015-02-14 NOTE — Telephone Encounter (Signed)
Patient a no-show for his appointment this morning. Called and patient answered, apologetic and states he forgot about appointment. Requested that clinic call him if any other appointments should open up later today. Also reminded patient of next appointment later this week.  Deniece Ree PT, DPT [phone]

## 2015-02-16 ENCOUNTER — Ambulatory Visit (HOSPITAL_COMMUNITY): Payer: Managed Care, Other (non HMO) | Admitting: Physical Therapy

## 2015-02-16 DIAGNOSIS — M6281 Muscle weakness (generalized): Secondary | ICD-10-CM | POA: Diagnosis present

## 2015-02-16 DIAGNOSIS — R5381 Other malaise: Principal | ICD-10-CM

## 2015-02-16 DIAGNOSIS — Z789 Other specified health status: Secondary | ICD-10-CM

## 2015-02-16 NOTE — Therapy (Signed)
[facility] [address] [place], [place], [address] Phone: [phone]   Fax:  [phone]  Physical Therapy Treatment  Patient Details  Name: Jacob Hunter MRN: [id number] Date of Birth: 1956-01-08 Referring Provider:  Asencion Noble, MD  Encounter Date: 02/16/2015      PT End of Session - 02/16/15 1101    Visit Number 2   Number of Visits 16   Date for PT Re-Evaluation 04/04/15   Authorization Type [employer]   Authorization Time Period 02/02/2015-04/04/15   Authorization - Visit Number 2   Authorization - Number of Visits 16   PT Start Time 1015   PT Stop Time 1056   PT Time Calculation (min) 41 min   Equipment Utilized During Treatment Gait belt   Activity Tolerance Patient tolerated treatment well   Behavior During Therapy [facility] for tasks assessed/performed      Past Medical History  Diagnosis Date  . Hypercholesteremia   . Knee pain   . Arthritis     Past Surgical History  Procedure Laterality Date  . Ankle surgery Left   . Knee surgery Left     There were no vitals filed for this visit.  Visit Diagnosis:  Alteration in performance of activities of daily living  Muscle weakness of lower extremity      Subjective Assessment - 02/16/15 1019    Subjective Pt reports that he feels weak today. He reports that he has some pain in his legs today, but only when he is walking. He is still having difficulty with standing up, and states that when he is sitting, he is only able to lift his foot about an inch off of the floor.    Currently in Pain? Yes   Pain Score 1    Pain Location Leg   Pain Orientation Right;Left              [facility] Adult PT Treatment/Exercise - 02/16/15 0001    Knee/Hip Exercises: Standing   Heel Raises 2 sets;15 reps   Lateral Step Up 10 reps;Step Height: 4";Both   Forward Step Up Step Height: 4";Both;10 reps   Knee/Hip Exercises: Seated   Long Arc Quad Strengthening;Both;15 reps   Long Arc  Quad Weight 3 lbs.   Sit to Sand 2 sets;10 reps;without UE support  from 25 inch mat table   Knee/Hip Exercises: Supine   Heel Slides 15 reps;Both;AAROM;Strengthening;2 sets   Bridges Limitations 10 reps   Knee/Hip Exercises: Sidelying   Clams 15 reps BLE                PT Education - 02/16/15 1101    Education provided Yes   Education Details Addition to HEP   Person(s) Educated Patient   Methods Explanation;Handout   Comprehension Verbalized understanding;Returned demonstration          PT Short Term Goals - 02/02/15 1216    PT SHORT TERM GOAL #1   Title Pt will tolerate 2 hours of household tasks without increase in pain, weakness or fatigue.    Baseline 45 minutes    Time 4   Period Weeks   Status New   PT SHORT TERM GOAL #2   Title Pt will demonstrate independence in performance and compliance in HEP with MinA or less from family.    Baseline Unfamiliar with HEP    Time 2   Period Weeks   Status New   PT SHORT TERM GOAL #3   Title Pt  will demonstrate ability to perform sit-to/from stand from elevated surface without use of hands 3x.    Baseline 0x without hands.    Time 4   Period Weeks   Status New           PT Long Term Goals - 02/02/15 1220    PT LONG TERM GOAL #1   Title Pt will demonstrate tolerance of 5 hours of household activites without exacerbation of symptms.    Baseline 45 minutes    Time 8   Period Weeks   Status New   PT LONG TERM GOAL #2   Title Pt will tolerate performance of 5 steps, with step-over gait and 1 HR to demonstrate ability to safely enter/exit home.    PT LONG TERM GOAL #3   Title Pt will demonstrate ability to ambulated 1548f in 6 minutes without AD or LOB to demonstrate return to PLOF in mobility.    Baseline 5 minutes of walking with frequent LOB.    Time 8   Period Weeks   Status New               Plan - 02/16/15 1101    Clinical Impression Statement Pt tolerated treatement session well, but  struggled with quad strengthening such as step ups and sit to stands. Sit to stands were performed from elevated surface without UE support to improve functional strengthening. Pt will benefit from continued strengthening, as well as gait and balance training to decrease risk for falls.    PT Next Visit Plan Gather addional objective data on balance deficits, continue with functional strengthening.          Problem List Patient Active Problem List   Diagnosis Date Noted  . Bilateral leg weakness 01/24/2015  . Elevated CPK 01/24/2015  . Transaminitis 10/24/2014    LHilma Favors PT, DPT [phone]/02/2015, 11:05 AM  [id number] [address]Phone: [phone]  Fax:  [phone]

## 2015-02-16 NOTE — Patient Instructions (Addendum)
Abduction: Clam (Eccentric) - Side-Lying   Lie on side with knees bent. Lift top knee, keeping feet together. Keep trunk steady.   15_ reps per set, _2__ sets per day, 1 time per day.   Copyright  VHI. All rights reserved.  Sitting to Standing   With straight back, tighten stomach, place right leg back under chair, lean slightly forward and stand. Repeat _10 times per set. Do __1__ sets per session. Do _2___ sessions per day.  http://orth.exer.us/1140   Copyright  VHI. All rights reserved.  Heel Raises   Stand with support. With knees straight, raise heels off ground. Repeat _15__ times. Do 2 sets per session. Do _1-2__ times a day.  Copyright  VHI. All rights reserved.

## 2015-02-24 ENCOUNTER — Ambulatory Visit: Payer: Managed Care, Other (non HMO) | Admitting: Nurse Practitioner

## 2015-02-24 ENCOUNTER — Other Ambulatory Visit (HOSPITAL_COMMUNITY): Payer: Self-pay | Admitting: Psychiatry

## 2015-02-24 DIAGNOSIS — M3320 Polymyositis, organ involvement unspecified: Secondary | ICD-10-CM

## 2015-02-27 ENCOUNTER — Encounter (HOSPITAL_COMMUNITY)
Admission: RE | Admit: 2015-02-27 | Discharge: 2015-02-27 | Disposition: A | Discharge status: Home / Self Care | Payer: Managed Care, Other (non HMO) | Source: Ambulatory Visit | Attending: Internal Medicine | Admitting: Internal Medicine

## 2015-02-27 DIAGNOSIS — M3320 Polymyositis, organ involvement unspecified: Secondary | ICD-10-CM | POA: Insufficient documentation

## 2015-02-27 MED ORDER — SODIUM CHLORIDE 0.9 % IV SOLN
Freq: Once | INTRAVENOUS | Status: AC
  Administered 2015-02-27: 250 mL via INTRAVENOUS

## 2015-02-27 MED ORDER — SODIUM CHLORIDE 0.9 % IV SOLN
1000.0000 mg | Freq: Every day | INTRAVENOUS | Status: DC
  Administered 2015-02-27: 1000 mg via INTRAVENOUS
  Filled 2015-02-27: qty 8

## 2015-02-27 NOTE — Progress Notes (Signed)
Here for solu-medrol infusion. Dx polymyositis.

## 2015-02-28 ENCOUNTER — Encounter (HOSPITAL_COMMUNITY)
Admission: RE | Admit: 2015-02-28 | Discharge: 2015-02-28 | Disposition: A | Discharge status: Home / Self Care | Payer: Managed Care, Other (non HMO) | Source: Ambulatory Visit | Attending: Internal Medicine | Admitting: Internal Medicine

## 2015-02-28 DIAGNOSIS — M3320 Polymyositis, organ involvement unspecified: Secondary | ICD-10-CM | POA: Diagnosis not present

## 2015-02-28 MED ORDER — METHYLPREDNISOLONE SODIUM SUCC 125 MG IJ SOLR: 1000.0000 mg | Freq: Once | INTRAMUSCULAR | Status: DC

## 2015-02-28 MED ORDER — SODIUM CHLORIDE 0.9 % IV SOLN
1000.0000 mg | Freq: Every day | INTRAVENOUS | Status: AC
  Administered 2015-02-28: 1000 mg via INTRAVENOUS
  Filled 2015-02-28: qty 8

## 2015-02-28 MED ORDER — SODIUM CHLORIDE 0.9 % IV SOLN
INTRAVENOUS | Status: DC
  Administered 2015-02-28: 12:00:00 via INTRAVENOUS

## 2015-03-01 ENCOUNTER — Encounter (HOSPITAL_COMMUNITY)
Admission: RE | Admit: 2015-03-01 | Discharge: 2015-03-01 | Disposition: A | Discharge status: Home / Self Care | Payer: Managed Care, Other (non HMO) | Source: Ambulatory Visit | Attending: Internal Medicine | Admitting: Internal Medicine

## 2015-03-01 ENCOUNTER — Encounter (HOSPITAL_COMMUNITY): Payer: Self-pay

## 2015-03-01 DIAGNOSIS — M3320 Polymyositis, organ involvement unspecified: Secondary | ICD-10-CM | POA: Diagnosis not present

## 2015-03-01 HISTORY — DX: Polymyositis, organ involvement unspecified: M33.20

## 2015-03-01 MED ORDER — SODIUM CHLORIDE 0.9 % IV SOLN
1000.0000 mg | Freq: Every day | INTRAVENOUS | Status: AC
  Administered 2015-03-01: 1000 mg via INTRAVENOUS
  Filled 2015-03-01: qty 8

## 2015-03-01 MED ORDER — SODIUM CHLORIDE 0.9 % IV SOLN
Freq: Once | INTRAVENOUS | Status: AC
  Administered 2015-03-01: 10:00:00 via INTRAVENOUS

## 2015-03-01 MED ORDER — METHYLPREDNISOLONE SODIUM SUCC 125 MG IJ SOLR: 1000.0000 mg | Freq: Once | INTRAMUSCULAR | Status: DC

## 2015-03-02 ENCOUNTER — Encounter (HOSPITAL_COMMUNITY)
Admission: RE | Admit: 2015-03-02 | Discharge: 2015-03-02 | Disposition: A | Discharge status: Home / Self Care | Payer: Managed Care, Other (non HMO) | Source: Ambulatory Visit | Attending: Internal Medicine | Admitting: Internal Medicine

## 2015-03-02 ENCOUNTER — Ambulatory Visit (HOSPITAL_COMMUNITY): Payer: Managed Care, Other (non HMO)

## 2015-03-02 DIAGNOSIS — M3320 Polymyositis, organ involvement unspecified: Secondary | ICD-10-CM | POA: Diagnosis not present

## 2015-03-02 LAB — GLUCOSE, CAPILLARY: Glucose-Capillary: 149 mg/dL — ABNORMAL HIGH (ref 65–99)

## 2015-03-02 MED ORDER — METHYLPREDNISOLONE SODIUM SUCC 1000 MG IJ SOLR
1000.0000 mg | Freq: Once | INTRAMUSCULAR | Status: AC
  Administered 2015-03-02: 1000 mg via INTRAVENOUS
  Filled 2015-03-02: qty 8

## 2015-03-02 MED ORDER — SODIUM CHLORIDE 0.9 % IV SOLN
INTRAVENOUS | Status: DC
  Administered 2015-03-02: 14:00:00 via INTRAVENOUS

## 2015-03-02 MED ORDER — METHYLPREDNISOLONE SODIUM SUCC 125 MG IJ SOLR: 1000.0000 mg | Freq: Once | INTRAMUSCULAR | Status: DC

## 2015-03-02 NOTE — Progress Notes (Signed)
Instructed wife to carry patient to [place] emergency room right now. Per dr Willey Blade.

## 2015-03-02 NOTE — Progress Notes (Signed)
While waiting on dr fagan to return my call was called back to patients room increased respirations. Patient in a panic could not get breath. Placed non rebreather mask on patient at 10 liters.  accucheck 149. Patient with family at side went to try and call dr fagan again. Spoke with dr fagan instructed to send patient to [facility]. Family to carry.

## 2015-03-02 NOTE — Progress Notes (Signed)
Was informed by patient family members that patient very weak feeling real bad had to be brought in by wheelchair. Checked in on patient a little clammy rapid respirations. Vs normal. 142/91. 82 98.3 28. Call in to dr  fagan.

## 2015-03-03 ENCOUNTER — Encounter (HOSPITAL_COMMUNITY): Admission: RE | Admit: 2015-03-03 | Payer: Managed Care, Other (non HMO) | Source: Ambulatory Visit

## 2015-03-30 ENCOUNTER — Ambulatory Visit (HOSPITAL_COMMUNITY): Payer: Managed Care, Other (non HMO) | Attending: Internal Medicine | Admitting: Physical Therapy

## 2015-03-30 DIAGNOSIS — R5381 Other malaise: Secondary | ICD-10-CM | POA: Diagnosis present

## 2015-03-30 DIAGNOSIS — M629 Disorder of muscle, unspecified: Secondary | ICD-10-CM | POA: Insufficient documentation

## 2015-03-30 DIAGNOSIS — M6281 Muscle weakness (generalized): Secondary | ICD-10-CM | POA: Diagnosis present

## 2015-03-30 DIAGNOSIS — R52 Pain, unspecified: Secondary | ICD-10-CM | POA: Diagnosis present

## 2015-03-30 DIAGNOSIS — M25612 Stiffness of left shoulder, not elsewhere classified: Secondary | ICD-10-CM | POA: Diagnosis present

## 2015-03-30 DIAGNOSIS — M7582 Other shoulder lesions, left shoulder: Secondary | ICD-10-CM | POA: Diagnosis present

## 2015-03-30 DIAGNOSIS — Z789 Other specified health status: Secondary | ICD-10-CM | POA: Diagnosis present

## 2015-03-30 DIAGNOSIS — R2681 Unsteadiness on feet: Secondary | ICD-10-CM | POA: Insufficient documentation

## 2015-03-30 DIAGNOSIS — M25611 Stiffness of right shoulder, not elsewhere classified: Secondary | ICD-10-CM | POA: Insufficient documentation

## 2015-03-30 DIAGNOSIS — M25511 Pain in right shoulder: Secondary | ICD-10-CM | POA: Insufficient documentation

## 2015-03-30 NOTE — Patient Instructions (Addendum)
Strengthening: Hip Adduction - Isometric   With ball or folded pillow between knees, squeeze knees together. Hold _5___ seconds. Repeat _10___ times per set. Do ___1_ sets per session. Do __2__ sessions per day.  http://orth.exer.us/612   Copyright  VHI. All rights reserved.  Bridging   Slowly raise buttocks from floor, keeping stomach tight. Repeat _10-15___ times per set. Do __1__ sets per session. Do _2___ sessions per day.  http://orth.exer.us/1096   Copyright  VHI. All rights reserved.  Hip Abduction / Adduction: with Extended Knee (Supine)   Bring left leg out to side and return. Keep knee straight. Repeat _10-15___ times per set. Do __1__ sets per session. Do __2__ sessions per day.  http://orth.exer.us/680   Copyright  VHI. All rights reserved.  Hip Abduction / Adduction: with Knee Flexion (Supine)   With right knee bent, gently lower knee to side and return. Repeat _10___ times per set. Do __1__ sets per session. Do __2__ sessions per day.  http://orth.exer.us/682   Copyright  VHI. All rights reserved.  Functional Quadriceps: Chair Squat  With bed, couch or chair behind you  Keeping feet flat on floor, shoulder width apart, squat as low as is comfortable. Use support as necessary. Repeat __10-15__ times per set. Do __1__ sets per session. Do _2___ sessions per day.  http://orth.exer.us/736   Copyright  VHI. All rights reserved.  Heel Raise: Bilateral (Standing)  With bed or chair behind you  Rise on balls of feet. Repeat 10-15____ times per set. Do _1___ sets per session. Do _2___ sessions per day.  http://orth.exer.us/38   Copyright  VHI. All rights reserved.  Knee Extension (Sitting)   Place ____ pound weight on left ankle and straighten knee fully, lower slowly. Repeat ____ times per set. Do ____ sets per session. Do ____ sessions per day.  http://orth.exer.us/732   Copyright  VHI. All rights reserved.  Bent Leg Lift  (Hook-Lying)   Tighten stomach and slowly raise right leg ____ inches from floor. Keep trunk rigid. Hold ____ seconds. Repeat ____ times per set. Do ____ sets per session. Do ____ sessions per day.  http://orth.exer.us/1090   Copyright  VHI. All rights reserved.

## 2015-03-30 NOTE — Patient Instructions (Signed)
Strengthening: Hip Adduction - Isometric   With ball or folded pillow between knees, squeeze knees together. Hold _5___ seconds. Repeat _10___ times per set. Do ___1_ sets per session. Do __2__ sessions per day.  http://orth.exer.us/612   Copyright  VHI. All rights reserved.  Bridging   Slowly raise buttocks from floor, keeping stomach tight. Repeat _10-15___ times per set. Do __1__ sets per session. Do _2___ sessions per day.  http://orth.exer.us/1096   Copyright  VHI. All rights reserved.  Hip Abduction / Adduction: with Extended Knee (Supine)   Bring left leg out to side and return. Keep knee straight. Repeat _10-15___ times per set. Do __1__ sets per session. Do __2__ sessions per day.  http://orth.exer.us/680   Copyright  VHI. All rights reserved.  Hip Abduction / Adduction: with Knee Flexion (Supine)   With right knee bent, gently lower knee to side and return. Repeat _10___ times per set. Do __1__ sets per session. Do __2__ sessions per day.  http://orth.exer.us/682   Copyright  VHI. All rights reserved.  Functional Quadriceps: Chair Squat  With bed, couch or chair behind you  Keeping feet flat on floor, shoulder width apart, squat as low as is comfortable. Use support as necessary. Repeat __10-15__ times per set. Do __1__ sets per session. Do _2___ sessions per day.  http://orth.exer.us/736   Copyright  VHI. All rights reserved.  Heel Raise: Bilateral (Standing)  With bed or chair behind you  Rise on balls of feet. Repeat 10-15____ times per set. Do _1___ sets per session. Do _2___ sessions per day.  http://orth.exer.us/38   Copyright  VHI. All rights reserved.  Knee Extension (Sitting)   Place ____ pound weight on left ankle and straighten knee fully, lower slowly. Repeat ____ times per set. Do ____ sets per session. Do ____ sessions per day.  http://orth.exer.us/732   Copyright  VHI. All rights reserved.  Bent Leg Lift  (Hook-Lying)   Tighten stomach and slowly raise right leg ____ inches from floor. Keep trunk rigid. Hold ____ seconds. Repeat ____ times per set. Do ____ sets per session. Do ____ sessions per day.  http://orth.exer.us/1090   Copyright  VHI. All rights reserved.

## 2015-03-30 NOTE — Therapy (Signed)
[facility] [place], [place], [address] Phone: [phone]   Fax:  [phone]  Physical Therapy Evaluation  Patient Details  Name: Jacob Hunter MRN: [id number] Date of Birth: 04-03-1956 Referring Provider:  Lawerance Bach, MD  Encounter Date: 03/30/2015      PT End of Session - 03/30/15 1539    Visit Number 1   Number of Visits 16   Date for PT Re-Evaluation 04/29/15   Authorization - Visit Number 1   Authorization - Number of Visits 10   PT Start Time 7793   PT Stop Time 1433   PT Time Calculation (min) 48 min   Activity Tolerance Patient tolerated treatment well   Behavior During Therapy [facility] for tasks assessed/performed      Past Medical History  Diagnosis Date  . Hypercholesteremia   . Knee pain   . Arthritis   . Polymyositis     Past Surgical History  Procedure Laterality Date  . Ankle surgery Left   . Knee surgery Left     There were no vitals filed for this visit.  Visit Diagnosis:  Alteration in performance of activities of daily living  Muscle weakness of lower extremity  Unsteady gait      Subjective Assessment - 03/30/15 1347    Subjective Pt states that he had sudden progressive weakness of both legs that got to a point where he could not get out of a chair or walk  On January 24 2015.  He was hospitalized until 6/25 and diagnoaed with polymyositis.    Jacob Hunter states that he had a reaction to the steroids and was transfeed to [facility].  His last week at [facility] he was at the IP rehab facility.  He was discharged to his home on the 18th.  He was given a HEP and has been completing this every day.  He is on prednisone now which is controling his enzyme level.  He currently is using a walker to ambulate, He can not go up  steps and he is having difficulty comig sit to stand; (needing his families assistance).      Patient is accompained by: Family member   Pertinent History History  of L knee scope (07/2013) and L ankle reconstructive surgeries (2012ish) per patient.     Limitations Lifting;Standing;Walking;House hold activities   How long can you sit comfortably? no problem    How long can you stand comfortably? 28 minutes    How long can you walk comfortably? With walker 5-10 minutes.    Patient Stated Goals Return of normal strength in BLE (especailly hip flexion).   Currently in Pain? No/denies  states mm soreness            [facility] PT Assessment - 03/30/15 0001    Assessment   Medical Diagnosis Polymyositis   Onset Date/Surgical Date 01/24/15   Hand Dominance Right   Next MD Visit 06/23  Jacob Hunter    Prior Therapy acute and IP    Precautions   Precautions Fall   Balance Screen   Has the patient fallen in the past 6 months Yes   How many times? 3   Has the patient had a decrease in activity level because of a fear of falling?  Yes   Is the patient reluctant to leave their home because of a fear of falling?  No   Home Social worker Private residence   Living  Arrangements Children;Spouse/significant other   Available Help at Discharge Family   Type of [place] Two level;Able to live on main level with bedroom/bathroom   Alternate Level Stairs-Number of Steps 13    Alternate Level Stairs-Rails Right   Home Equipment None   Prior Function   Level of Independence Independent   Vocation Full time employment  40 hours, very active job   Biomedical scientist walking, movement, runs two machines, bending, mild repetitive lifting    Leisure hunting, golf, beach going, fishing   Cognition   Overall Cognitive Status Within Functional Limits for tasks assessed   Attention Focused   Observation/Other Assessments   Observations WNL   Skin Integrity WNL   Focus on Therapeutic Outcomes (FOTO)  44   Sensation   Light Touch Appears Intact   Coordination   Gross Motor Movements are Fluid and Coordinated No   Functional  Tests   Functional tests Single leg stance;Sit to Stand   Single Leg Stance   Comments unable to perform    Sit to Stand   Comments Unable to perform    Strength   Overall Strength Deficits   Right Hip Flexion 2-/5   Right Hip Extension 2-/5   Right Hip External Rotation  --   Right Hip ABduction 2-/5   Right Hip ADduction 4/5   Left Hip Flexion 2/5   Left Hip Extension 2-/5   Left Hip External Rotation  --   Left Hip ABduction 2-/5   Left Hip ADduction 4/5   Right Knee Flexion 3+/5   Right Knee Extension 4/5   Left Knee Flexion 3+/5   Left Knee Extension 4/5   Right Ankle Dorsiflexion 4/5   Left Ankle Dorsiflexion 4/5   Special Tests    Special Tests --   Knee Special tests  --   Ambulation/Gait   Ambulation/Gait Yes                   [facility] Adult PT Treatment/Exercise - 03/30/15 0001    Bed Mobility   Bed Mobility --   Rolling Right --   Rolling Left --   Supine to Sit --   Sit to Supine --   Scooting to [facility] --   Ambulation/Gait   Assistive device Rolling walker;None   Gait Pattern Step-through pattern;Decreased step length - right;Decreased step length - left;Decreased dorsiflexion - right;Decreased dorsiflexion - left;Right circumduction;Left circumduction;Wide base of support  genu varum bilat noted   Knee/Hip Exercises: Seated   Long Arc Quad Both;10 reps;Weights   Long Arc Quad Weight 5 lbs.   Knee/Hip Exercises: Supine   Heel Slides Both;5 reps   Bridges Limitations 5   Other Supine Knee/Hip Exercises bent knee raise, clam and hip ab/adducton with knee straight x 10 B    Other Supine Knee/Hip Exercises hip addcutioan x 10                 PT Education - 03/30/15 1539    Education provided Yes   Education Details HEP   Person(s) Educated Patient   Methods Explanation;Handout   Comprehension Verbalized understanding;Returned demonstration          PT Short Term Goals - 03/30/15 1548    PT SHORT TERM GOAL #1   Title I in HEP    Time 1   Period Weeks   PT SHORT TERM GOAL #2   Title Pt to be able to come sit to stand with UE  assist but needing no assist from others    Time 3   Period Weeks   PT SHORT TERM GOAL #3   Title Pt to be able to stand for 5 minutes without use of UE for improved balance and decreased risk of falling    Time 3   Period Weeks           PT Long Term Goals - 03/30/15 1550    PT LONG TERM GOAL #1   Title I in advance HEP   Time 6   Period Weeks   PT LONG TERM GOAL #2   Title Pt to be able to go up and down steps with UE assist    Time 6   Period Weeks   PT LONG TERM GOAL #3   Title Pt strength to have increased by one grade to be able to ambulate with a hemiwalker/cane to allow one hand to be free to open doors.   Time 6   Period Weeks   PT LONG TERM GOAL #4   Title Pt to be able to pick an item off the floor   Time 6   Period Weeks               Plan - 03/30/15 1541    Clinical Impression Statement Mr. Twaddle is a 59 yo male who has been diagnosed with progressive polymyositis.  He was discharged from [facility] facility and is now bering referred to PT to maximize his functional ability.  Examination demonstrates decreased mobility due to decreased LE and core strength, decreased balance, difficulty walking  and decredased functional tolerance.  Mr Baranek was working full time and very active prior to this diagnosis; he will benefit from skilled PT to maximize his functional ability and improve his quality of life.     Pt will benefit from skilled therapeutic intervention in order to improve on the following deficits Abnormal gait;Decreased activity tolerance;Decreased balance;Decreased mobility;Decreased strength;Difficulty walking   Rehab Potential Good   PT Frequency 2x / week   PT Duration 6 weeks   PT Treatment/Interventions ADLs/Self Care Home Management;Gait training;Stair training;Functional mobility training;Therapeutic activities;Therapeutic  exercise;Balance training;Neuromuscular re-education;Patient/family education   PT Next Visit Plan Pt will benefit from biodex unweighting system, work on sit to stand, standing without assistive device with head turns progressing to cone rotation, functional squating, side stepping progress to lunges as able.    Consulted and Agree with Plan of Care Patient         Problem List Patient Active Problem List   Diagnosis Date Noted  . Bilateral leg weakness 01/24/2015  . Elevated CPK 01/24/2015  . Transaminitis 10/24/2014    Rayetta Humphrey, PT CLT [phone] 03/30/2015, 3:53 PM  [facility] [address] [place], [place], [address] Phone: [phone]   Fax:  [phone]

## 2015-03-31 ENCOUNTER — Ambulatory Visit (HOSPITAL_COMMUNITY): Payer: Managed Care, Other (non HMO) | Admitting: Occupational Therapy

## 2015-03-31 ENCOUNTER — Encounter (HOSPITAL_COMMUNITY): Payer: Self-pay | Admitting: Occupational Therapy

## 2015-03-31 DIAGNOSIS — M25611 Stiffness of right shoulder, not elsewhere classified: Secondary | ICD-10-CM

## 2015-03-31 DIAGNOSIS — Z789 Other specified health status: Secondary | ICD-10-CM

## 2015-03-31 DIAGNOSIS — R5381 Other malaise: Secondary | ICD-10-CM | POA: Diagnosis not present

## 2015-03-31 DIAGNOSIS — M25612 Stiffness of left shoulder, not elsewhere classified: Secondary | ICD-10-CM

## 2015-03-31 DIAGNOSIS — M629 Disorder of muscle, unspecified: Secondary | ICD-10-CM

## 2015-03-31 DIAGNOSIS — M6281 Muscle weakness (generalized): Secondary | ICD-10-CM

## 2015-03-31 DIAGNOSIS — M6289 Other specified disorders of muscle: Secondary | ICD-10-CM

## 2015-03-31 NOTE — Therapy (Signed)
[place], [place], [address] Phone: [phone]   Fax:  [phone]  Occupational Therapy Evaluation  Patient Details  Name: Jacob Hunter MRN: [id number] Date of Birth: 08-11-1956 Referring Provider:  Asencion Noble, MD  Encounter Date: 03/31/2015      OT End of Session - 03/31/15 1647    Visit Number 1   Number of Visits 12   Date for OT Re-Evaluation 05/30/15   Authorization Type [employer]   OT Start Time 1433   OT Stop Time 1515   OT Time Calculation (min) 42 min   Activity Tolerance Patient tolerated treatment well   Behavior During Therapy [facility] for tasks assessed/performed      Past Medical History  Diagnosis Date  . Hypercholesteremia   . Knee pain   . Arthritis   . Polymyositis     Past Surgical History  Procedure Laterality Date  . Ankle surgery Left   . Knee surgery Left     There were no vitals filed for this visit.  Visit Diagnosis:  Muscle weakness of left arm  Muscle weakness of right arm  Tight fascia  Decreased activities of daily living (ADL)  Decreased right shoulder range of motion  Decreased range of motion of left shoulder      Subjective Assessment - 03/31/15 1621    Subjective  S: I've been trying to do these exercises they gave me, but afterwards I'm worn out.    Patient is accompained by: Family member   Pertinent History Pt is a 59 y/o male with sudden progressive weakness of LE & UE leading to diagnosis of polymyositis. He was hospitalized from 6/14-6/25, and then spent a week at an IP rehabi facility in [facility]. He is currently completing an HEP provided by the IP rehab facility working on arm strengtheining. Dr. Rosana Hoes referred pt to occupational therapy for evaluation and treatment.             [facility] OT Assessment - 03/31/15 1436    Assessment   Diagnosis Polymyositis-progressive UE weakness   Onset Date 01/28/15   Prior Therapy 1 week IP rehab at  [place]   Has the patient fallen in the past 6 months Yes   How many times? 3   Has the patient had a decrease in activity level because of a fear of falling?  No   Is the patient reluctant to leave their home because of a fear of falling?  No   Prior Function   Level of Independence Independent   Vocation Full time employment   Vocation Requirements walking, movement, runs two machines, bending, mild repetitive lifting    Leisure hunting, golf, beach going, fishing   ADL   ADL comments Pt has difficulty donning shirts, reaching into overhead cabinets, carrying objects heavier than 1/2 gallon of milk.    Written Expression   Dominant Hand Right   Vision - History   Baseline Vision Wears glasses all the time   Activity Tolerance   Activity Tolerance Tolerates 10-20 min activity with muiltiple rests   Cognition   Overall Cognitive Status Within Functional Limits for tasks assessed   Coordination   9 Hole Peg Test Left;Right   Right 9 Hole Peg Test 20.58"   Left 9 Hole Peg Test 20.80"   ROM / Strength   AROM / PROM / Strength Strength;AROM;PROM   Palpation  Palpation comment Mod-max fascial restrictions in bilateral trapezius and scapularis regions   AROM   AROM Assessment Site Shoulder   Right/Left Shoulder Left;Right   Right Shoulder Flexion 91 Degrees  seated 156   Right Shoulder ABduction 134 Degrees  seated 121   Right Shoulder Internal Rotation 90 Degrees  seated 90   Right Shoulder External Rotation 56 Degrees  seated 85   Left Shoulder Flexion 100 Degrees  seated 147   Left Shoulder ABduction 127 Degrees  seated 120   Left Shoulder Internal Rotation 90 Degrees  seated 90   Left Shoulder External Rotation 43 Degrees  seated 74   PROM   Overall PROM Comments [facility]   PROM Assessment Site Shoulder   Right/Left Shoulder Left;Right                                   Strength   Strength Assessment Site  Shoulder;Elbow;Hand   Right/Left Shoulder Left;Right   Right Shoulder Flexion 3/5   Right Shoulder ABduction 3/5   Right Shoulder Internal Rotation 3/5   Right Shoulder External Rotation 3/5   Left Shoulder Flexion 3/5   Left Shoulder ABduction 3/5   Left Shoulder Internal Rotation 3/5   Left Shoulder External Rotation 3/5   Right/Left Elbow Left;Right   Right Elbow Flexion 4-/5   Right Elbow Extension 4-/5   Left Elbow Flexion 4-/5   Left Elbow Extension 4-/5   Right/Left hand Left;Right   Right Hand Gross Grasp Functional   Right Hand Grip (lbs) 63   Right Hand Lateral Pinch 17 lbs   Right Hand 3 Point Pinch 21 lbs   Left Hand Gross Grasp Functional   Left Hand Grip (lbs) 65   Left Hand Lateral Pinch 18 lbs   Left Hand 3 Point Pinch 18 lbs                         OT Education - 03/31/15 1646    Education provided Yes   Education Details Educated pt on energy conservation strategies during daiily tasks and when exercising.    Person(s) Educated Patient   Methods Explanation   Comprehension Verbalized understanding          OT Short Term Goals - 03/31/15 1654    OT SHORT TERM GOAL #1   Title Pt will be educated on HEP.    Time 3   Period Weeks   Status New   OT SHORT TERM GOAL #2   Title Pt will decrease fascial restrictions from mod to max amounts.    Time 3   Period Weeks   Status New   OT SHORT TERM GOAL #3   Title Pt will decrease pain to 2/10 during daily tasks.    Time 3   Period Weeks   Status New   OT SHORT TERM GOAL #4   Title Pt will increase range of motion to [facility] to increase ability to donn shirts.    Time 3   Period Weeks   Status New   OT SHORT TERM GOAL #5   Title Pt will increase strength to 4/5 to increase ability to lift milk out of refridgerator.    Time 3   Period Weeks   Status New           OT Long Term Goals - 03/31/15 1655    OT LONG TERM GOAL #  1   Title Pt will return to prior level of independence and  functioning during daily and work tasks.    Time 6   Period Weeks   Status New   OT LONG TERM GOAL #2   Title Pt will report no pain during daily tasks.    Time 6   Period Weeks   Status New   OT LONG TERM GOAL #3   Title Pt will increase AROM to WNL to increase ability to lift plates out of overhead cabinets.    Time 6   Period Weeks   Status New   OT LONG TERM GOAL #4   Title Pt will increase strength to 4+/5 to increase ability to complete lifting tasks at work.    Time 6   Period Weeks   Status New   OT LONG TERM GOAL #5   Title Pt will decrease fascial restrictions from min to trace amounts or less.    Time 6   Period Weeks   Status New               Plan - 03/31/15 1648    Clinical Impression Statement A: Pt is a 59 y/o male diagnosed with polymyositis experiencing BUE weakness. Pt reports tremors/shakes in his hands as his muscles fatigue. Pt is unable to achieve full range of motion, with proper form, against gravity. Decreased range of motion, increased pain and fascial restrictions, and decreased strength limit pt's ability to perform daily tasks and work tasks. Pt educated on contination of exercises provided by IP facility on discharge utilizing rest breaks and energy conservation strategies as needed.    Pt will benefit from skilled therapeutic intervention in order to improve on the following deficits (Retired) Decreased strength;Pain;Impaired UE functional use;Decreased range of motion;Increased fascial restricitons   Rehab Potential Good   OT Frequency 2x / week   OT Duration 6 weeks   OT Treatment/Interventions Self-care/ADL training;Passive range of motion;Patient/family education;Electrical Stimulation;Therapeutic exercises;Manual Therapy;Moist Heat;Therapeutic activities   Plan P: Pt would benefit from skilled occupational therapy services to decrease pain and fascial restrictions, increase range of motion and strength to increase ability to perform B/IADl  and work tasks independently. Treatment Plan: Myofascial release, PROM, AAROM, AROM, general BUE strengthening, scapular stabilization tasks.    Consulted and Agree with Plan of Care Patient        Problem List Patient Active Problem List   Diagnosis Date Noted  . Bilateral leg weakness 01/24/2015  . Elevated CPK 01/24/2015  . Transaminitis 10/24/2014    Guadelupe Sabin, OTR/L  [phone]  03/31/2015, 4:59 PM  [place] [address] [place], [place], [address] Phone: [phone]   Fax:  [phone]

## 2015-04-05 ENCOUNTER — Ambulatory Visit (HOSPITAL_COMMUNITY): Payer: Managed Care, Other (non HMO)

## 2015-04-05 ENCOUNTER — Encounter (HOSPITAL_COMMUNITY): Payer: Self-pay

## 2015-04-05 DIAGNOSIS — M6281 Muscle weakness (generalized): Secondary | ICD-10-CM

## 2015-04-05 DIAGNOSIS — M25612 Stiffness of left shoulder, not elsewhere classified: Secondary | ICD-10-CM

## 2015-04-05 DIAGNOSIS — R5381 Other malaise: Secondary | ICD-10-CM | POA: Diagnosis not present

## 2015-04-05 DIAGNOSIS — M629 Disorder of muscle, unspecified: Secondary | ICD-10-CM

## 2015-04-05 DIAGNOSIS — M25611 Stiffness of right shoulder, not elsewhere classified: Secondary | ICD-10-CM

## 2015-04-05 DIAGNOSIS — M6289 Other specified disorders of muscle: Secondary | ICD-10-CM

## 2015-04-05 NOTE — Therapy (Signed)
[place], [place], [address] Phone: [phone]   Fax:  [phone]  Occupational Therapy Treatment  Patient Details  Name: Jacob Hunter MRN: [id number] Date of Birth: 04-Sep-1955 Referring Provider:  Asencion Noble, MD  Encounter Date: 04/05/2015      OT End of Session - 04/05/15 1506    Visit Number 2   Number of Visits 12   Date for OT Re-Evaluation 05/30/15   Authorization Type [employer]   OT Start Time 1347   OT Stop Time 1432   OT Time Calculation (min) 45 min   Activity Tolerance Patient tolerated treatment well   Behavior During Therapy [facility] for tasks assessed/performed      Past Medical History  Diagnosis Date  . Hypercholesteremia   . Knee pain   . Arthritis   . Polymyositis     Past Surgical History  Procedure Laterality Date  . Ankle surgery Left   . Knee surgery Left     There were no vitals filed for this visit.  Visit Diagnosis:  Muscle weakness of left arm  Muscle weakness of right arm  Tight fascia  Stiffness of shoulder joint, left  Stiffness of shoulder joint, right      Subjective Assessment - 04/05/15 1416    Subjective  S: I"m going to be honest. I fell yesterday trying to pick up something on the ground. I fell backwards and hit my left elbow and my head on the cabinet.    Patient is accompained by: Family member   Currently in Pain? Yes   Pain Score 4    Pain Location Elbow   Pain Orientation Left   Pain Descriptors / Indicators Aching;Grimacing   Pain Type Acute pain   Pain Onset Yesterday            [facility] OT Assessment - 04/05/15 1504    Assessment   Diagnosis Polymyositis-progressive UE weakness   Precautions   Precautions Fall                  OT Treatments/Exercises (OP) - 04/05/15 1504    Exercises   Exercises Shoulder   Shoulder Exercises: Supine   Protraction PROM;5 reps;AROM;10 reps   Horizontal ABduction PROM;5 reps;AROM;10 reps    External Rotation PROM;5 reps;AROM;10 reps   Internal Rotation PROM;5 reps;AROM;10 reps   Flexion PROM;5 reps;AROM;10 reps   ABduction PROM;5 reps;AROM;10 reps   Manual Therapy   Manual Therapy Myofascial release   Myofascial Release Myofascial release to bilateral upper arm region to decrease fascial restrictions and increase joint mobility in a pain free zone.                 OT Education - 04/05/15 1506    Education provided Yes   Education Details Pt was given OT evaluation and reviewed therapy goals. Pt given AROM exercises and educated on each one.   Person(s) Educated Patient   Methods Explanation;Demonstration;Handout   Comprehension Returned demonstration;Verbalized understanding          OT Short Term Goals - 04/05/15 1503    OT SHORT TERM GOAL #1   Title Pt will be educated on HEP.    Time 3   Period Weeks   Status On-going   OT SHORT TERM GOAL #2   Title Pt will decrease fascial restrictions from max to mod amounts.    Time 3   Period Weeks   Status On-going   OT SHORT TERM  GOAL #3   Title Pt will decrease pain to 2/10 during daily tasks.    Time 3   Period Weeks   Status On-going   OT SHORT TERM GOAL #4   Title Pt will increase range of motion to [facility] to increase ability to donn shirts.    Time 3   Period Weeks   Status On-going   OT SHORT TERM GOAL #5   Title Pt will increase strength to 4/5 to increase ability to lift milk out of refridgerator.    Time 3   Period Weeks   Status On-going           OT Long Term Goals - 04/05/15 1503    OT LONG TERM GOAL #1   Title Pt will return to prior level of independence and functioning during daily and work tasks.    Time 6   Period Weeks   Status On-going   OT LONG TERM GOAL #2   Title Pt will report no pain during daily tasks.    Time 6   Period Weeks   Status On-going   OT LONG TERM GOAL #3   Title Pt will increase AROM to WNL to increase ability to lift plates out of overhead cabinets.     Time 6   Period Weeks   Status On-going   OT LONG TERM GOAL #4   Title Pt will increase strength to 4+/5 to increase ability to complete lifting tasks at work.    Time 6   Period Weeks   Status On-going   OT LONG TERM GOAL #5   Title Pt will decrease fascial restrictions from min to trace amounts or less.    Time 6   Period Weeks   Status On-going               Plan - 04/05/15 1506    Clinical Impression Statement A: Initiated myofascial release. Pt presents with most of his resitrictions in the deltoid region for BUE. Pt has some edema on left elbow from fall. Medial region of left elbow was tender to touch but therapist did not palpate any restrictions. Recommended patient put ice it at home to reduce swelling.    Plan P: Follow up on HEP. Add 1# to supine exercises. Complete seated AROM exercises and add weight if needed. Add UBE bike.         Problem List Patient Active Problem List   Diagnosis Date Noted  . Bilateral leg weakness 01/24/2015  . Elevated CPK 01/24/2015  . Transaminitis 10/24/2014    Ailene Ravel, OTR/L,CBIS  [phone]  04/05/2015, 3:10 PM  [place] [address] [place], [place], [address] Phone: [phone]   Fax:  [phone]

## 2015-04-05 NOTE — Patient Instructions (Signed)
1) Shoulder Protraction    Begin with elbows by your side, slowly "punch" straight out in front. Repeat _10__times, _2-3___set/day.     2) Shoulder Flexion  Supine:     Standing:         Begin with arms at your side with thumbs pointed up, slowly raise both arms up and forward towards overhead. Repeat_10__times, _2-3__set/day.       3) Horizontal abduction/adduction  Supine:   Standing:           Begin with arms straight out in front of you, bring out to the side in at "T" shape. Keep arms straight entire time. Repeat _10___times, __2-3__sets/day.      4) Internal & External Rotation    *No band* -Stand with elbows at the side and elbows bent 90 degrees. Move your forearms away from your body, then bring back inward toward the body.  Repeat _10__times, ___sets/day    5) Shoulder Abduction  Supine:     Standing:       Lying on your back begin with your arms flat on the table next to your side. Slowly move your arms out to the side so that they go overhead, in a jumping jack or snow angel movement. Repeat  10_times, 2-3___sets/day

## 2015-04-06 ENCOUNTER — Ambulatory Visit (HOSPITAL_COMMUNITY): Payer: Managed Care, Other (non HMO) | Admitting: Physical Therapy

## 2015-04-06 DIAGNOSIS — R52 Pain, unspecified: Secondary | ICD-10-CM

## 2015-04-06 DIAGNOSIS — R2681 Unsteadiness on feet: Secondary | ICD-10-CM

## 2015-04-06 DIAGNOSIS — R5381 Other malaise: Secondary | ICD-10-CM | POA: Diagnosis not present

## 2015-04-06 DIAGNOSIS — M6281 Muscle weakness (generalized): Secondary | ICD-10-CM

## 2015-04-06 NOTE — Therapy (Signed)
[place], [place], [address] Phone: [phone]   Fax:  [phone]  Physical Therapy Treatment  Patient Details  Name: Jacob Hunter MRN: [id number] Date of Birth: [birth year] Referring Provider:  Lawerance Bach, MD  Encounter Date: 04/06/2015      PT End of Session - 04/06/15 1737    Visit Number 2   Number of Visits 16   Date for PT Re-Evaluation 04/29/15   Authorization Type [employer]   Authorization Time Period 02/02/2015-04/04/15   Authorization - Visit Number 2   Authorization - Number of Visits 10   PT Start Time 1350   PT Stop Time 1433   PT Time Calculation (min) 43 min   Equipment Utilized During Treatment Gait belt   Activity Tolerance Patient tolerated treatment well      Past Medical History  Diagnosis Date  . Hypercholesteremia   . Knee pain   . Arthritis   . Polymyositis     Past Surgical History  Procedure Laterality Date  . Ankle surgery Left   . Knee surgery Left     There were no vitals filed for this visit.  Visit Diagnosis:  Muscle weakness of lower extremity  Unsteady gait  Pain, acute      Subjective Assessment - 04/06/15 1354    Subjective Pt feel on Monday states he went to bend down further than he should have and his legs buckled.  He is doing his exercises at home.     Currently in Pain? No/denies   Pain Location Elbow                [facility] Adult PT Treatment/Exercise - 04/06/15 0001    Exercises   Exercises Knee/Hip   Knee/Hip Exercises: Standing   Heel Raises Both;10 reps   Functional Squat 10 reps   SLS x5   Other Standing Knee Exercises marching x 10; side step at mat along side x 2 RT with green t-band    Other Standing Knee Exercises standing   UE's  going into flexion/abduction to 90 degrees x 10    Knee/Hip Exercises: Seated   Sit to Sand 2 sets;10 reps  Rt in back then LT from 25" height                   PT Short Term  Goals - 03/30/15 1548    PT SHORT TERM GOAL #1   Title I in HEP   Time 1   Period Weeks   PT SHORT TERM GOAL #2   Title Pt to be able to come sit to stand with UE assist but needing no assist from others    Time 3   Period Weeks   PT SHORT TERM GOAL #3   Title Pt to be able to stand for 5 minutes without use of UE for improved balance and decreased risk of falling    Time 3   Period Weeks           PT Long Term Goals - 03/30/15 1550    PT LONG TERM GOAL #1   Title I in advance HEP   Time 6   Period Weeks   PT LONG TERM GOAL #2   Title Pt to be able to go up and down steps with UE assist    Time 6   Period Weeks   PT LONG TERM GOAL #3   Title Pt strength to have increased  by one grade to be able to ambulate with a hemiwalker/cane to allow one hand to be free to open doors.   Time 6   Period Weeks   PT LONG TERM GOAL #4   Title Pt to be able to pick an item off the floor   Time 6   Period Weeks               Plan - 04/06/15 1737    Clinical Impression Statement Jacob Hunter tends to keep his shoulders behind his center of gravity which causes him to be off balanced he is able to correct this when verbally cued.  Pt began weightbearing strengthening exercises with theapist facilitation for proper technique.  Pt  needed several rest breaks throughout treatment.    Pt will benefit from skilled therapeutic intervention in order to improve on the following deficits Abnormal gait;Decreased activity tolerance;Decreased balance;Decreased mobility;Decreased strength;Difficulty walking   PT Treatment/Interventions ADLs/Self Care Home Management;Gait training;Stair training;Functional mobility training;Therapeutic activities;Therapeutic exercise;Balance training;Neuromuscular re-education;Patient/family education   PT Next Visit Plan [facility] rotation, progress to lunging as able.         Problem List Patient Active Problem List   Diagnosis Date Noted  . Bilateral leg  weakness 01/24/2015  . Elevated CPK 01/24/2015  . Transaminitis 10/24/2014    Rayetta Humphrey, PT CLT [phone] 04/06/2015, 5:40 PM  [place] [address] [place], [place], [address] Phone: [phone]   Fax:  [phone]

## 2015-04-07 ENCOUNTER — Encounter (HOSPITAL_COMMUNITY): Payer: Managed Care, Other (non HMO) | Admitting: Specialist

## 2015-04-12 ENCOUNTER — Ambulatory Visit (HOSPITAL_COMMUNITY): Payer: Managed Care, Other (non HMO)

## 2015-04-12 ENCOUNTER — Encounter (HOSPITAL_COMMUNITY): Payer: Self-pay

## 2015-04-12 DIAGNOSIS — R52 Pain, unspecified: Secondary | ICD-10-CM

## 2015-04-12 DIAGNOSIS — M6281 Muscle weakness (generalized): Secondary | ICD-10-CM

## 2015-04-12 DIAGNOSIS — R2681 Unsteadiness on feet: Secondary | ICD-10-CM

## 2015-04-12 DIAGNOSIS — R5381 Other malaise: Secondary | ICD-10-CM | POA: Diagnosis not present

## 2015-04-12 DIAGNOSIS — M6289 Other specified disorders of muscle: Secondary | ICD-10-CM

## 2015-04-12 DIAGNOSIS — M629 Disorder of muscle, unspecified: Secondary | ICD-10-CM

## 2015-04-12 NOTE — Therapy (Signed)
[place] [address] [place], [place], [address] Phone: [phone]   Fax:  [phone]  Occupational Therapy Treatment  Patient Details  Name: Jacob Hunter MRN: [id number] Date of Birth: 07-23-1956 Referring Provider:  Asencion Noble, MD  Encounter Date: 04/12/2015      OT End of Session - 04/12/15 1555    Visit Number 3   Number of Visits 12   Date for OT Re-Evaluation 05/30/15   Authorization Type [employer]   OT Start Time 1520   OT Stop Time 1600   OT Time Calculation (min) 40 min   Activity Tolerance Patient tolerated treatment well   Behavior During Therapy [facility] for tasks assessed/performed      Past Medical History  Diagnosis Date  . Hypercholesteremia   . Knee pain   . Arthritis   . Polymyositis     Past Surgical History  Procedure Laterality Date  . Ankle surgery Left   . Knee surgery Left     There were no vitals filed for this visit.  Visit Diagnosis:  Muscle weakness of left arm  Muscle weakness of right arm  Tight fascia      Subjective Assessment - 04/12/15 1538    Subjective  S: The doctor doesn't want me to use weights right now because my enzymes are low.    Currently in Pain? Yes   Pain Score 1    Pain Location Shoulder   Pain Orientation Left   Pain Descriptors / Indicators Aching   Pain Type Acute pain            [facility] OT Assessment - 04/12/15 1539    Assessment   Diagnosis Polymyositis-progressive UE weakness   Precautions   Precautions Fall   Precaution Comments No arm weights right now per MD. Enzymes are low.                   OT Treatments/Exercises (OP) - 04/12/15 1540    Exercises   Exercises Shoulder   Shoulder Exercises: Supine   Protraction PROM;5 reps;AROM;15 reps   Horizontal ABduction PROM;5 reps;AROM;15 reps   External Rotation PROM;5 reps;AROM;15 reps   Internal Rotation PROM;5 reps;AROM;15 reps   Flexion PROM;5 reps;AROM;15 reps   ABduction  PROM;5 reps;AROM;15 reps   Shoulder Exercises: ROM/Strengthening   UBE (Upper Arm Bike) Level 1 2' forward 2' reverse   Over Head Lace 1'30"   Proximal Shoulder Strengthening, Supine 15X with rest breaks   Rhythmic Stabilization, Supine 30" each arm all directions   Manual Therapy   Manual Therapy Myofascial release   Myofascial Release Myofascial release to bilateral upper arm region to decrease fascial restrictions and increase joint mobility in a pain free zone.                   OT Short Term Goals - 04/05/15 1503    OT SHORT TERM GOAL #1   Title Pt will be educated on HEP.    Time 3   Period Weeks   Status On-going   OT SHORT TERM GOAL #2   Title Pt will decrease fascial restrictions from max to mod amounts.    Time 3   Period Weeks   Status On-going   OT SHORT TERM GOAL #3   Title Pt will decrease pain to 2/10 during daily tasks.    Time 3   Period Weeks   Status On-going   OT SHORT TERM GOAL #4   Title  Pt will increase range of motion to [facility] to increase ability to donn shirts.    Time 3   Period Weeks   Status On-going   OT SHORT TERM GOAL #5   Title Pt will increase strength to 4/5 to increase ability to lift milk out of refridgerator.    Time 3   Period Weeks   Status On-going           OT Long Term Goals - 04/05/15 1503    OT LONG TERM GOAL #1   Title Pt will return to prior level of independence and functioning during daily and work tasks.    Time 6   Period Weeks   Status On-going   OT LONG TERM GOAL #2   Title Pt will report no pain during daily tasks.    Time 6   Period Weeks   Status On-going   OT LONG TERM GOAL #3   Title Pt will increase AROM to WNL to increase ability to lift plates out of overhead cabinets.    Time 6   Period Weeks   Status On-going   OT LONG TERM GOAL #4   Title Pt will increase strength to 4+/5 to increase ability to complete lifting tasks at work.    Time 6   Period Weeks   Status On-going   OT LONG TERM  GOAL #5   Title Pt will decrease fascial restrictions from min to trace amounts or less.    Time 6   Period Weeks   Status On-going               Plan - 04/12/15 1556    Clinical Impression Statement A: Pt states that MD does not want him to use any weights at this time due to low enzymes. We did not add 1# weight to exercises due to MD orders. Added over head lacing and UBE and patient tolerated well.    Plan P: Increase repetitions for supine exercises. Complete seated AROM. Add theraband scapular exercises. D/C manual stretching as patient has full PROM.         Problem List Patient Active Problem List   Diagnosis Date Noted  . Bilateral leg weakness 01/24/2015  . Elevated CPK 01/24/2015  . Transaminitis 10/24/2014    Ailene Ravel, OTR/L,CBIS  [phone]  04/12/2015, 4:00 PM  [place] [address] [place], [place], [address] Phone: [phone]   Fax:  [phone]

## 2015-04-12 NOTE — Therapy (Signed)
[place], [place], [address] Phone: [phone]   Fax:  [phone]  Physical Therapy Treatment  Patient Details  Name: Jacob Hunter MRN: [id number] Date of Birth: Apr 30, 1956 Referring Provider:  Asencion Noble, MD  Encounter Date: 04/12/2015      PT End of Session - 04/12/15 1645    Visit Number 3   Number of Visits 16   Date for PT Re-Evaluation 04/29/15   Authorization Type [employer]   Authorization - Visit Number 3   Authorization - Number of Visits 10   PT Start Time 4854   PT Stop Time 6270   PT Time Calculation (min) 42 min   Equipment Utilized During Treatment Gait belt   Activity Tolerance Patient tolerated treatment well;Patient limited by fatigue   Behavior During Therapy [facility] for tasks assessed/performed      Past Medical History  Diagnosis Date  . Hypercholesteremia   . Knee pain   . Arthritis   . Polymyositis     Past Surgical History  Procedure Laterality Date  . Ankle surgery Left   . Knee surgery Left     There were no vitals filed for this visit.  Visit Diagnosis:  Muscle weakness of lower extremity  Unsteady gait  Pain, acute      Subjective Assessment - 04/12/15 1610    Subjective Pain free today, reports compliance with HEP daily.   Patient Stated Goals Return of normal strength in BLE (especailly hip flexion).   Currently in Pain? No/denies                         [facility] Adult PT Treatment/Exercise - 04/12/15 1643    Exercises   Exercises Knee/Hip   Knee/Hip Exercises: Standing   Heel Raises Both;15 reps   Other Standing Knee Exercises marching x 10; side step x 2 RT with green t-band    Other Standing Knee Exercises cone rotation on foam 10x each R/L   Knee/Hip Exercises: Seated   Sit to Sand 2 sets;10 reps;without UE support  Rt and Lt LE each no HHA                                                                           PT Short Term Goals - 03/30/15 1548    PT SHORT TERM GOAL #1   Title I in HEP   Time 1   Period Weeks   PT SHORT TERM GOAL #2   Title Pt to be able to come sit to stand with UE assist but needing no assist from others    Time 3   Period Weeks   PT SHORT TERM GOAL #3   Title Pt to be able to stand for 5 minutes without use of UE for improved balance and decreased risk of falling    Time 3   Period Weeks           PT Long Term Goals - 03/30/15 1550    PT LONG TERM GOAL #1   Title I in advance HEP   Time 6   Period Weeks   PT LONG TERM GOAL #2   Title  Pt to be able to go up and down steps with UE assist    Time 6   Period Weeks   PT LONG TERM GOAL #3   Title Pt strength to have increased by one grade to be able to ambulate with a hemiwalker/cane to allow one hand to be free to open doors.   Time 6   Period Weeks   PT LONG TERM GOAL #4   Title Pt to be able to pick an item off the floor   Time 6   Period Weeks               Plan - 04/12/15 1646    Clinical Impression Statement Pt tends to demonstrate posterior lean behind his COG requiring cueing to improve balance and min assistance required.  Began cone rotation on foam to improve crossing midline and balance with min assistance required.  Pt limited by fatigue with activities at end of session though is improving acitvity tolerance with no rest breaks requested this session.     PT Next Visit Plan Begin lunging next session.          Problem List Patient Active Problem List   Diagnosis Date Noted  . Bilateral leg weakness 01/24/2015  . Elevated CPK 01/24/2015  . Transaminitis 10/24/2014   Ihor Austin, [place]; [place]  Aldona Lento 04/12/2015, 7:12 PM  [facility] [address] [place], [place], [address] Phone: [phone]   Fax:  [phone]

## 2015-04-14 ENCOUNTER — Ambulatory Visit (HOSPITAL_COMMUNITY): Payer: Managed Care, Other (non HMO) | Attending: Internal Medicine

## 2015-04-14 ENCOUNTER — Encounter (HOSPITAL_COMMUNITY): Payer: Self-pay

## 2015-04-14 ENCOUNTER — Ambulatory Visit (HOSPITAL_COMMUNITY): Payer: Managed Care, Other (non HMO) | Admitting: Physical Therapy

## 2015-04-14 DIAGNOSIS — R52 Pain, unspecified: Secondary | ICD-10-CM | POA: Diagnosis present

## 2015-04-14 DIAGNOSIS — M629 Disorder of muscle, unspecified: Secondary | ICD-10-CM | POA: Diagnosis present

## 2015-04-14 DIAGNOSIS — M6281 Muscle weakness (generalized): Secondary | ICD-10-CM | POA: Diagnosis not present

## 2015-04-14 DIAGNOSIS — R5381 Other malaise: Secondary | ICD-10-CM

## 2015-04-14 DIAGNOSIS — M7582 Other shoulder lesions, left shoulder: Secondary | ICD-10-CM | POA: Insufficient documentation

## 2015-04-14 DIAGNOSIS — Z789 Other specified health status: Secondary | ICD-10-CM | POA: Diagnosis present

## 2015-04-14 DIAGNOSIS — M25511 Pain in right shoulder: Secondary | ICD-10-CM | POA: Insufficient documentation

## 2015-04-14 DIAGNOSIS — R2681 Unsteadiness on feet: Secondary | ICD-10-CM | POA: Diagnosis present

## 2015-04-14 DIAGNOSIS — M6289 Other specified disorders of muscle: Secondary | ICD-10-CM

## 2015-04-14 DIAGNOSIS — M25611 Stiffness of right shoulder, not elsewhere classified: Secondary | ICD-10-CM | POA: Diagnosis present

## 2015-04-14 DIAGNOSIS — M25612 Stiffness of left shoulder, not elsewhere classified: Secondary | ICD-10-CM | POA: Diagnosis present

## 2015-04-14 NOTE — Therapy (Signed)
[facility] [address] [place], [place], [address] Phone: [phone]   Fax:  [phone]  Physical Therapy Treatment  Patient Details  Name: Jacob Hunter MRN: [id number] Date of Birth: Dec 15, 1955 Referring Provider:  Asencion Noble, MD  Encounter Date: 04/14/2015      PT End of Session - 04/14/15 1551    Visit Number 4   Number of Visits 16   Date for PT Re-Evaluation 04/29/15   Authorization Type [employer]   Authorization Time Period 02/02/2015-04/04/15   Authorization - Visit Number 4   Authorization - Number of Visits 10   PT Start Time 1430   PT Stop Time 1512   PT Time Calculation (min) 42 min   Equipment Utilized During Treatment Gait belt   Activity Tolerance Patient tolerated treatment well   Behavior During Therapy [facility] for tasks assessed/performed      Past Medical History  Diagnosis Date  . Hypercholesteremia   . Knee pain   . Arthritis   . Polymyositis     Past Surgical History  Procedure Laterality Date  . Ankle surgery Left   . Knee surgery Left     There were no vitals filed for this visit.  Visit Diagnosis:  Muscle weakness of lower extremity  Unsteady gait  Alteration in performance of activities of daily living      Subjective Assessment - 04/14/15 1431    Subjective Pt reports that he has some increased soreness today, denies having any real pain. He reports that he has been compliant with his exercises daily   Currently in Pain? No/denies                         [facility] Adult PT Treatment/Exercise - 04/14/15 1437    Knee/Hip Exercises: Standing   Heel Raises Both;15 reps   Forward Lunges 10 reps   Forward Lunges Limitations 6" step   Forward Step Up 10 reps;Step Height: 4"   Knee/Hip Exercises: Seated   Sit to Sand 2 sets;10 reps  23.5" mat height   Knee/Hip Exercises: Supine   Bridges Limitations 20   Other Supine Knee/Hip Exercises bent knee raise x15   Knee/Hip  Exercises: Sidelying   Clams 15 reps BLE             Balance Exercises - 04/14/15 1511    Balance Exercises: Standing   Tandem Stance 2 reps;30 secs   Standing, One Foot on a Step 4 inch;6 inch;2 reps;30 secs             PT Short Term Goals - 03/30/15 1548    PT SHORT TERM GOAL #1   Title I in HEP   Time 1   Period Weeks   PT SHORT TERM GOAL #2   Title Pt to be able to come sit to stand with UE assist but needing no assist from others    Time 3   Period Weeks   PT SHORT TERM GOAL #3   Title Pt to be able to stand for 5 minutes without use of UE for improved balance and decreased risk of falling    Time 3   Period Weeks           PT Long Term Goals - 03/30/15 1550    PT LONG TERM GOAL #1   Title I in advance HEP   Time 6   Period Weeks   PT LONG TERM GOAL #2  Title Pt to be able to go up and down steps with UE assist    Time 6   Period Weeks   PT LONG TERM GOAL #3   Title Pt strength to have increased by one grade to be able to ambulate with a hemiwalker/cane to allow one hand to be free to open doors.   Time 6   Period Weeks   PT LONG TERM GOAL #4   Title Pt to be able to pick an item off the floor   Time 6   Period Weeks               Plan - 04/14/15 1551    Clinical Impression Statement Continued with functional strengthening, with focus on glut and quad strengthening. Pt reports that he wants to return to work, and in order to do his job, he needs to be able to complete squatting from low positions. He had difficulty with sit to stands today, but was able to complete them from 23.5 inch surface, which is 2" shorter than when last completed. Pt requires frequent rest breaks due to fatigue. Pt had little difficulty with balance training today, able to maintain tandem stance and standing with one foot on 6" step without any UE support and with no LOB.    PT Next Visit Plan Continue with lowering mat table for sit to stand, continue with step ups  and lunges        Problem List Patient Active Problem List   Diagnosis Date Noted  . Bilateral leg weakness 01/24/2015  . Elevated CPK 01/24/2015  . Transaminitis 10/24/2014    Hilma Favors, PT, DPT [phone] 04/14/2015, 3:55 PM  [facility] [address] [place], [place], [address] Phone: [phone]   Fax:  [phone]

## 2015-04-14 NOTE — Therapy (Signed)
[place], [place], [address] Phone: [phone]   Fax:  [phone]  Occupational Therapy Treatment  Patient Details  Name: Jacob Hunter MRN: [id number] Date of Birth: October 12, 1955 Referring Provider:  Lawerance Bach, MD  Encounter Date: 04/14/2015      OT End of Session - 04/14/15 1433    Visit Number 4   Number of Visits 12   Date for OT Re-Evaluation 05/30/15   Authorization Type [employer]   OT Start Time 1347   OT Stop Time 1430   OT Time Calculation (min) 43 min   Activity Tolerance Patient tolerated treatment well   Behavior During Therapy [facility] for tasks assessed/performed      Past Medical History  Diagnosis Date  . Hypercholesteremia   . Knee pain   . Arthritis   . Polymyositis     Past Surgical History  Procedure Laterality Date  . Ankle surgery Left   . Knee surgery Left     There were no vitals filed for this visit.  Visit Diagnosis:  Muscle weakness of left arm  Muscle weakness of right arm  Tight fascia      Subjective Assessment - 04/14/15 1404    Subjective  S: I'm just a little sore from doing my exercises. No pain though.   Currently in Pain? No/denies            [facility] OT Assessment - 04/14/15 1406    Assessment   Diagnosis Polymyositis-progressive UE weakness   Precautions   Precautions Fall   Precaution Comments No arm weights right now per MD. Enzymes are low.                   OT Treatments/Exercises (OP) - 04/14/15 1406    Exercises   Exercises Shoulder   Shoulder Exercises: Supine   Protraction AROM;20 reps   Horizontal ABduction AROM;20 reps   External Rotation AROM;20 reps   Internal Rotation AROM;20 reps   Flexion AROM;20 reps   ABduction AROM;20 reps   Shoulder Exercises: Seated   Extension Theraband;12 reps   Theraband Level (Shoulder Extension) Level 2 (Red)   Retraction Theraband;12 reps   Theraband Level (Shoulder Retraction)  Level 2 (Red)   Row Theraband;12 reps   Theraband Level (Shoulder Row) Level 2 (Red)   Protraction AROM;12 reps   Horizontal ABduction AROM;12 reps   External Rotation AROM;12 reps   Internal Rotation AROM;12 reps   Flexion AROM;12 reps   Abduction AROM;12 reps   Shoulder Exercises: ROM/Strengthening   Proximal Shoulder Strengthening, Supine 20X with rest breaks   Rhythmic Stabilization, Supine 30" each arm all directions   Manual Therapy   Manual Therapy Myofascial release   Myofascial Release Myofascial release to bilateral upper arm region to decrease fascial restrictions and increase joint mobility in a pain free zone.                   OT Short Term Goals - 04/05/15 1503    OT SHORT TERM GOAL #1   Title Pt will be educated on HEP.    Time 3   Period Weeks   Status On-going   OT SHORT TERM GOAL #2   Title Pt will decrease fascial restrictions from max to mod amounts.    Time 3   Period Weeks   Status On-going   OT SHORT TERM GOAL #3   Title Pt will decrease pain to 2/10 during daily  tasks.    Time 3   Period Weeks   Status On-going   OT SHORT TERM GOAL #4   Title Pt will increase range of motion to [facility] to increase ability to donn shirts.    Time 3   Period Weeks   Status On-going   OT SHORT TERM GOAL #5   Title Pt will increase strength to 4/5 to increase ability to lift milk out of refridgerator.    Time 3   Period Weeks   Status On-going           OT Long Term Goals - 04/05/15 1503    OT LONG TERM GOAL #1   Title Pt will return to prior level of independence and functioning during daily and work tasks.    Time 6   Period Weeks   Status On-going   OT LONG TERM GOAL #2   Title Pt will report no pain during daily tasks.    Time 6   Period Weeks   Status On-going   OT LONG TERM GOAL #3   Title Pt will increase AROM to WNL to increase ability to lift plates out of overhead cabinets.    Time 6   Period Weeks   Status On-going   OT LONG TERM  GOAL #4   Title Pt will increase strength to 4+/5 to increase ability to complete lifting tasks at work.    Time 6   Period Weeks   Status On-going   OT LONG TERM GOAL #5   Title Pt will decrease fascial restrictions from min to trace amounts or less.    Time 6   Period Weeks   Status On-going               Plan - 04/14/15 1433    Clinical Impression Statement A: Added scapular strengthening with theraband. Increased repetitions with supine and added seated AROM.     Plan D/C manual stretching. Cont with missed exercises. Add ball on the wall.         Problem List Patient Active Problem List   Diagnosis Date Noted  . Bilateral leg weakness 01/24/2015  . Elevated CPK 01/24/2015  . Transaminitis 10/24/2014   Ailene Ravel, OTR/L,CBIS  [phone]  04/14/2015, 3:07 PM  [facility] [address] [place], [place], [address] Phone: [phone]   Fax:  [phone]

## 2015-04-18 ENCOUNTER — Ambulatory Visit (HOSPITAL_COMMUNITY): Payer: Managed Care, Other (non HMO)

## 2015-04-18 DIAGNOSIS — R52 Pain, unspecified: Secondary | ICD-10-CM

## 2015-04-18 DIAGNOSIS — R5381 Other malaise: Secondary | ICD-10-CM

## 2015-04-18 DIAGNOSIS — M25611 Stiffness of right shoulder, not elsewhere classified: Secondary | ICD-10-CM

## 2015-04-18 DIAGNOSIS — M6281 Muscle weakness (generalized): Secondary | ICD-10-CM

## 2015-04-18 DIAGNOSIS — R2681 Unsteadiness on feet: Secondary | ICD-10-CM

## 2015-04-18 DIAGNOSIS — M25612 Stiffness of left shoulder, not elsewhere classified: Secondary | ICD-10-CM

## 2015-04-18 DIAGNOSIS — M6289 Other specified disorders of muscle: Secondary | ICD-10-CM

## 2015-04-18 DIAGNOSIS — M629 Disorder of muscle, unspecified: Secondary | ICD-10-CM

## 2015-04-18 DIAGNOSIS — Z789 Other specified health status: Secondary | ICD-10-CM

## 2015-04-18 NOTE — Patient Instructions (Signed)
Pt given additional cues on logical progression of ambulation assistive devices as he attempts to improve independence at home.

## 2015-04-18 NOTE — Therapy (Signed)
[facility] [address] [place], [place], [address] Phone: [phone]   Fax:  [phone]  Physical Therapy Treatment  Patient Details  Name: Jacob Hunter MRN: [id number] Date of Birth: [birth year] Referring Provider:  Lawerance Bach, MD  Encounter Date: 04/18/2015      PT End of Session - 04/18/15 1509    Visit Number 5   Number of Visits 16   Date for PT Re-Evaluation 04/29/15   Authorization Type [employer]   Authorization Time Period 02/02/2015-04/04/15   Authorization - Visit Number 5   Authorization - Number of Visits 10   PT Start Time 0254   PT Stop Time 1350   PT Time Calculation (min) 45 min   Equipment Utilized During Treatment Gait belt   Activity Tolerance Patient tolerated treatment well   Behavior During Therapy [facility] for tasks assessed/performed      Past Medical History  Diagnosis Date  . Hypercholesteremia   . Knee pain   . Arthritis   . Polymyositis     Past Surgical History  Procedure Laterality Date  . Ankle surgery Left   . Knee surgery Left     There were no vitals filed for this visit.  Visit Diagnosis:  Muscle weakness of lower extremity  Decreased activities of daily living (ADL)  Unsteady gait  Decreased right shoulder range of motion  Decreased range of motion of left shoulder  Alteration in performance of activities of daily living  Muscle weakness of left arm  Muscle weakness of right arm  Tight fascia  Pain, acute  Stiffness of shoulder joint, left  Stiffness of shoulder joint, right      Subjective Assessment - 04/18/15 1309    Subjective Pt reports that he is pain free today. Pt reports he is often sore after session, but it fully resolves after a short nap.    Patient is accompained by: Family member   Pertinent History History of L knee scope (07/2013) and L ankle reconstructive surgeries (2012ish) per patient.     Limitations Lifting;Standing;Walking;House hold  activities   Currently in Pain? No/denies                         [facility] Adult PT Treatment/Exercise - 04/18/15 0001    Ambulation/Gait   Gait Comments 433f in 3:228f  Exercises   Other Exercises  seated trunk rotation c contralateral UE reach   1x20 each side alteranting.    Knee/Hip Exercises: Standing   Heel Raises Both;10 reps   Hip Flexion AROM;2 sets;Knee bent;Both  2x8   Other Standing Knee Exercises Seated ankle dorsiflexion AROM   2x15 bilat   Knee/Hip Exercises: Seated   Long Arc Quad Both;10 reps;2 sets   Long Arc Quad Weight 0 lbs.  Physician says no weight   Marching Limitations 2x10 bilat seated  70-100 degrees hip flexion   Hamstring Curl 2 sets;Strengthening;Both  Seated stool rolling 2x3639f Sit to Sand 3 sets;without UE support  3x8 until (chair + 2 airex)                  PT Education - 04/18/15 1508    Education provided Yes   Education Details importance of core stability during LE exercises.    Person(s) Educated Patient   Methods Explanation;Demonstration   Comprehension Verbalized understanding          PT Short Term Goals - 03/30/15 1548  PT SHORT TERM GOAL #1   Title I in HEP   Time 1   Period Weeks   PT SHORT TERM GOAL #2   Title Pt to be able to come sit to stand with UE assist but needing no assist from others    Time 3   Period Weeks   PT SHORT TERM GOAL #3   Title Pt to be able to stand for 5 minutes without use of UE for improved balance and decreased risk of falling    Time 3   Period Weeks           PT Long Term Goals - 03/30/15 1550    PT LONG TERM GOAL #1   Title I in advance HEP   Time 6   Period Weeks   PT LONG TERM GOAL #2   Title Pt to be able to go up and down steps with UE assist    Time 6   Period Weeks   PT LONG TERM GOAL #3   Title Pt strength to have increased by one grade to be able to ambulate with a hemiwalker/cane to allow one hand to be free to open doors.   Time 6    Period Weeks   PT LONG TERM GOAL #4   Title Pt to be able to pick an item off the floor   Time 6   Period Weeks               Plan - 04/18/15 1514    Clinical Impression Statement Pt continues to make progress in functional mobility, and activity tolerance. Pt most limited in tolerance to prolonged exercises and requires frequent rest breaks to recover. Pt demonstrating dyskinesia in LE due to poor coordination of gross motor groups.    Pt will benefit from skilled therapeutic intervention in order to improve on the following deficits Abnormal gait;Decreased activity tolerance;Decreased balance;Decreased mobility;Decreased strength;Difficulty walking   Rehab Potential Good   PT Frequency 2x / week   PT Duration 6 weeks   PT Treatment/Interventions ADLs/Self Care Home Management;Gait training;Stair training;Functional mobility training;Therapeutic activities;Therapeutic exercise;Balance training;Neuromuscular re-education;Patient/family education   PT Next Visit Plan Since MD has suggested to DC use of weights at this time, it may be more benefitial to focus on high repetitive activites rather than high force strengthening activities. This may fit the paradigm of gradually increasing tolerance to repeated, gradually increasing bouts of gait.    PT Home Exercise Plan Given prone quads stretch and supine heel slides.    Consulted and Agree with Plan of Care Patient        Problem List Patient Active Problem List   Diagnosis Date Noted  . Bilateral leg weakness 01/24/2015  . Elevated CPK 01/24/2015  . Transaminitis 10/24/2014    Buccola,Allan C 04/18/2015, 3:21 PM 3:21 PM  Etta Grandchild, PT, DPT [place] License # [id number]       [facility] [address] [place], [place], [address] Phone: [phone]   Fax:  [phone]

## 2015-04-19 ENCOUNTER — Encounter (HOSPITAL_COMMUNITY): Payer: Managed Care, Other (non HMO) | Admitting: Physical Therapy

## 2015-04-19 ENCOUNTER — Encounter (HOSPITAL_COMMUNITY): Payer: Managed Care, Other (non HMO)

## 2015-04-20 ENCOUNTER — Encounter (HOSPITAL_COMMUNITY): Payer: Managed Care, Other (non HMO) | Admitting: Physical Therapy

## 2015-04-21 ENCOUNTER — Ambulatory Visit (HOSPITAL_COMMUNITY): Payer: Managed Care, Other (non HMO) | Admitting: Physical Therapy

## 2015-04-21 ENCOUNTER — Encounter (HOSPITAL_COMMUNITY): Payer: Self-pay | Admitting: Occupational Therapy

## 2015-04-21 ENCOUNTER — Ambulatory Visit (HOSPITAL_COMMUNITY): Payer: Managed Care, Other (non HMO) | Admitting: Occupational Therapy

## 2015-04-21 DIAGNOSIS — M6281 Muscle weakness (generalized): Secondary | ICD-10-CM

## 2015-04-21 DIAGNOSIS — Z789 Other specified health status: Secondary | ICD-10-CM

## 2015-04-21 DIAGNOSIS — M629 Disorder of muscle, unspecified: Secondary | ICD-10-CM

## 2015-04-21 DIAGNOSIS — R2681 Unsteadiness on feet: Secondary | ICD-10-CM

## 2015-04-21 DIAGNOSIS — M25611 Stiffness of right shoulder, not elsewhere classified: Secondary | ICD-10-CM

## 2015-04-21 DIAGNOSIS — M25612 Stiffness of left shoulder, not elsewhere classified: Secondary | ICD-10-CM

## 2015-04-21 DIAGNOSIS — M6289 Other specified disorders of muscle: Secondary | ICD-10-CM

## 2015-04-21 NOTE — Therapy (Signed)
[place], [place], [address] Phone: [phone]   Fax:  [phone]  Occupational Therapy Treatment  Patient Details  Name: Jacob Hunter MRN: [id number] Date of Birth: 08/07/1956 Referring Provider:  Asencion Noble, MD  Encounter Date: 04/21/2015      OT End of Session - 04/21/15 1457    Visit Number 5   Number of Visits 12   Date for OT Re-Evaluation 05/30/15   Authorization Type [employer]   OT Start Time 7989   OT Stop Time 1430   OT Time Calculation (min) 41 min   Activity Tolerance Patient tolerated treatment well   Behavior During Therapy [facility] for tasks assessed/performed      Past Medical History  Diagnosis Date  . Hypercholesteremia   . Knee pain   . Arthritis   . Polymyositis     Past Surgical History  Procedure Laterality Date  . Ankle surgery Left   . Knee surgery Left     There were no vitals filed for this visit.  Visit Diagnosis:  Muscle weakness of left arm  Muscle weakness of right arm  Tight fascia  Stiffness of shoulder joint, left  Stiffness of shoulder joint, right      Subjective Assessment - 04/21/15 1457    Subjective  S: I usually do more repetitions at home than we do here.    Currently in Pain? No/denies            [facility] OT Assessment - 04/21/15 1352    Assessment   Diagnosis Polymyositis-progressive UE weakness   Precautions   Precautions Fall   Precaution Comments No arm weights right now per MD. Enzymes are low.                   OT Treatments/Exercises (OP) - 04/21/15 1352    Exercises   Exercises Shoulder   Shoulder Exercises: Supine   Protraction AROM;20 reps   Horizontal ABduction AROM;20 reps   External Rotation AROM;20 reps   Internal Rotation AROM;20 reps   Flexion AROM;20 reps   ABduction AROM;20 reps   Shoulder Exercises: Seated   Extension Theraband;12 reps   Theraband Level (Shoulder Extension) Level 2 (Red)   Retraction  Theraband;12 reps   Theraband Level (Shoulder Retraction) Level 2 (Red)   Row Theraband;12 reps   Theraband Level (Shoulder Row) Level 2 (Red)   Protraction AROM;12 reps   Horizontal ABduction AROM;12 reps   External Rotation AROM;12 reps   Internal Rotation AROM;12 reps   Flexion AROM;12 reps   Abduction AROM;12 reps   Shoulder Exercises: ROM/Strengthening   Proximal Shoulder Strengthening, Supine 20X with rest breaks   Ball on Wall 1' flexion BUE    Manual Therapy   Manual Therapy Myofascial release   Myofascial Release Myofascial release to bilateral upper arm region to decrease fascial restrictions and increase joint mobility in a pain free zone.                   OT Short Term Goals - 04/05/15 1503    OT SHORT TERM GOAL #1   Title Pt will be educated on HEP.    Time 3   Period Weeks   Status On-going   OT SHORT TERM GOAL #2   Title Pt will decrease fascial restrictions from max to mod amounts.    Time 3   Period Weeks   Status On-going   OT SHORT TERM GOAL #3  Title Pt will decrease pain to 2/10 during daily tasks.    Time 3   Period Weeks   Status On-going   OT SHORT TERM GOAL #4   Title Pt will increase range of motion to [facility] to increase ability to donn shirts.    Time 3   Period Weeks   Status On-going   OT SHORT TERM GOAL #5   Title Pt will increase strength to 4/5 to increase ability to lift milk out of refridgerator.    Time 3   Period Weeks   Status On-going           OT Long Term Goals - 04/05/15 1503    OT LONG TERM GOAL #1   Title Pt will return to prior level of independence and functioning during daily and work tasks.    Time 6   Period Weeks   Status On-going   OT LONG TERM GOAL #2   Title Pt will report no pain during daily tasks.    Time 6   Period Weeks   Status On-going   OT LONG TERM GOAL #3   Title Pt will increase AROM to WNL to increase ability to lift plates out of overhead cabinets.    Time 6   Period Weeks    Status On-going   OT LONG TERM GOAL #4   Title Pt will increase strength to 4+/5 to increase ability to complete lifting tasks at work.    Time 6   Period Weeks   Status On-going   OT LONG TERM GOAL #5   Title Pt will decrease fascial restrictions from min to trace amounts or less.    Time 6   Period Weeks   Status On-going               Plan - 04/21/15 1458    Clinical Impression Statement A: Added ball on wall this session. Continued seated AROM exercises, pt required intermittant rest breaks due to fatigue. Did not complete UBE & overhead lace due to time constraints.    Plan P: Resume UBE & overhead lacing         Problem List Patient Active Problem List   Diagnosis Date Noted  . Bilateral leg weakness 01/24/2015  . Elevated CPK 01/24/2015  . Transaminitis 10/24/2014    Guadelupe Sabin, OTR/L  [phone]  04/21/2015, 3:00 PM  [facility] [address] [place], [place], [address] Phone: [phone]   Fax:  [phone]

## 2015-04-21 NOTE — Therapy (Signed)
[place], [place], [address] Phone: [phone]   Fax:  [phone]  Physical Therapy Treatment  Patient Details  Name: Jacob Hunter MRN: [id number] Date of Birth: 04/03/1956 Referring Provider:  Asencion Noble, MD  Encounter Date: 04/21/2015      PT End of Session - 04/21/15 1605    Visit Number 6   Number of Visits 16   Date for PT Re-Evaluation 04/29/15   Authorization Type [employer]   Authorization - Visit Number 6   Authorization - Number of Visits 10   PT Start Time 0938   PT Stop Time 1518   PT Time Calculation (min) 43 min   Equipment Utilized During Treatment Gait belt   Activity Tolerance Patient tolerated treatment well   Behavior During Therapy [facility] for tasks assessed/performed      Past Medical History  Diagnosis Date  . Hypercholesteremia   . Knee pain   . Arthritis   . Polymyositis     Past Surgical History  Procedure Laterality Date  . Ankle surgery Left   . Knee surgery Left     There were no vitals filed for this visit.  Visit Diagnosis:  Decreased activities of daily living (ADL)  Unsteady gait  Muscle weakness of lower extremity      Subjective Assessment - 04/21/15 1436    Subjective Pt states that his arms are getting stronger but that is about it.  Has not noticed increased leg strength at this time.    Currently in Pain? No/denies                         [facility] Adult PT Treatment/Exercise - 04/21/15 1450    Knee/Hip Exercises: Standing   Heel Raises Both;15 reps   Functional Squat 15 reps   SLS x5 Rt max 35 seconds ; Lt 14   Other Standing Knee Exercises marching x 10; side step x 2 RT     Other Standing Knee Exercises cone rotation    Knee/Hip Exercises: Seated   Long Arc Quad Both;5 reps   Long Arc Quad Weight --  ultra slow   Sit to [employer] 10 reps  each leg = 20                  PT Short Term Goals - 03/30/15 1548    PT SHORT  TERM GOAL #1   Title I in HEP   Time 1   Period Weeks   PT SHORT TERM GOAL #2   Title Pt to be able to come sit to stand with UE assist but needing no assist from others    Time 3   Period Weeks   PT SHORT TERM GOAL #3   Title Pt to be able to stand for 5 minutes without use of UE for improved balance and decreased risk of falling    Time 3   Period Weeks           PT Long Term Goals - 03/30/15 1550    PT LONG TERM GOAL #1   Title I in advance HEP   Time 6   Period Weeks   PT LONG TERM GOAL #2   Title Pt to be able to go up and down steps with UE assist    Time 6   Period Weeks   PT LONG TERM GOAL #3   Title Pt strength to have increased by  one grade to be able to ambulate with a hemiwalker/cane to allow one hand to be free to open doors.   Time 6   Period Weeks   PT LONG TERM GOAL #4   Title Pt to be able to pick an item off the floor   Time 6   Period Weeks               Plan - 04/21/15 1606    Clinical Impression Statement Pt verbalizes that he has not made much progress but leaves walker behind and takes two unassisted steps to mat table which he would not have been able to complete when he started out-patient therapy.  Be still demonstrates decreased balance, strength and endurance but all are progressing.     PT Next Visit Plan decrease sit to stands to 5 each. ast these are to fatiguing for the patient.         Problem List Patient Active Problem List   Diagnosis Date Noted  . Bilateral leg weakness 01/24/2015  . Elevated CPK 01/24/2015  . Transaminitis 10/24/2014    Rayetta Humphrey, PT CLT [phone] 04/21/2015, 4:10 PM  [facility] [address] [place], [place], [address] Phone: [phone]   Fax:  [phone]

## 2015-04-25 ENCOUNTER — Ambulatory Visit (HOSPITAL_COMMUNITY): Payer: Managed Care, Other (non HMO)

## 2015-04-25 ENCOUNTER — Encounter (HOSPITAL_COMMUNITY): Payer: Self-pay

## 2015-04-25 DIAGNOSIS — R52 Pain, unspecified: Secondary | ICD-10-CM

## 2015-04-25 DIAGNOSIS — M6281 Muscle weakness (generalized): Secondary | ICD-10-CM

## 2015-04-25 DIAGNOSIS — Z789 Other specified health status: Secondary | ICD-10-CM

## 2015-04-25 DIAGNOSIS — R2681 Unsteadiness on feet: Secondary | ICD-10-CM

## 2015-04-25 DIAGNOSIS — R5381 Other malaise: Secondary | ICD-10-CM

## 2015-04-25 NOTE — Therapy (Signed)
[place], [place], [address] Phone: [phone]   Fax:  [phone]  Physical Therapy Treatment  Patient Details  Name: Jacob Hunter MRN: [id number] Date of Birth: 04-30-1956 Referring Provider:  Lawerance Bach, MD  Encounter Date: 04/25/2015      PT End of Session - 04/25/15 1441    Visit Number 7   Number of Visits 16   Date for PT Re-Evaluation 04/29/15   Authorization Type [employer]   Authorization Time Period 02/02/2015-04/04/15   Authorization - Visit Number 7   Authorization - Number of Visits 10   PT Start Time 6767   PT Stop Time 1436   PT Time Calculation (min) 47 min   Activity Tolerance Patient tolerated treatment well;Patient limited by fatigue   Behavior During Therapy [facility] for tasks assessed/performed      Past Medical History  Diagnosis Date  . Hypercholesteremia   . Knee pain   . Arthritis   . Polymyositis     Past Surgical History  Procedure Laterality Date  . Ankle surgery Left   . Knee surgery Left     There were no vitals filed for this visit.  Visit Diagnosis:  Decreased activities of daily living (ADL)  Unsteady gait  Muscle weakness of lower extremity  Alteration in performance of activities of daily living  Pain, acute      Subjective Assessment - 04/25/15 1357    Subjective Pt reporting that his mobility at home is improving, he has now stopped using the RW, and is now using two trekking posts outside and nothing inside.    Patient is accompained by: Family member   Pertinent History History of L knee scope (07/2013) and L ankle reconstructive surgeries (2012ish) per patient.     Limitations Lifting;Standing;Walking;House hold activities   Patient Stated Goals Return of normal strength in BLE (especailly hip flexion).   Currently in Pain? No/denies   Pain Score 0-No pain   Pain Location --  total body soreness.                          [facility] Adult PT Treatment/Exercise - 04/25/15 0001    Ambulation/Gait   Ambulation/Gait Yes   Gait Comments 3x450   Exercises   Other Exercises  Seated trunk extension c anterior pelvic tilt  2x8, manual resistance to keep pelvis in tilt   Lumbar Exercises: Stretches   Quadruped Mid Back Stretch Other (comment)  10x3 seconds alternating Cat/Camel    Knee/Hip Exercises: Standing   Heel Raises Both;20 reps   Hip Abduction AROM;Both;2 sets;10 reps  tactile cues for straight trunk. Avoiding hip hike.    Functional Squat 3 sets  3x8 c red ball and 25" height, overhead thrust             Balance Exercises - 04/25/15 1409    Balance Exercises: Standing   Standing Eyes Opened Narrow base of support (BOS);Solid surface;2 reps;30 secs  Shoulders at 90 degree flexion           PT Education - 04/25/15 1438    Education provided Yes   Education Details Attempting greater use of back extensors during transfer activities; application of cat/camel at home to focus in on trunk strength in HEP.    Person(s) Educated Patient   Methods Explanation;Demonstration;Tactile cues   Comprehension Verbalized understanding;Returned demonstration          PT Short Term  Goals - 03/30/15 1548    PT SHORT TERM GOAL #1   Title I in HEP   Time 1   Period Weeks   PT SHORT TERM GOAL #2   Title Pt to be able to come sit to stand with UE assist but needing no assist from others    Time 3   Period Weeks   PT SHORT TERM GOAL #3   Title Pt to be able to stand for 5 minutes without use of UE for improved balance and decreased risk of falling    Time 3   Period Weeks           PT Long Term Goals - 03/30/15 1550    PT LONG TERM GOAL #1   Title I in advance HEP   Time 6   Period Weeks   PT LONG TERM GOAL #2   Title Pt to be able to go up and down steps with UE assist    Time 6   Period Weeks   PT LONG TERM GOAL #3   Title Pt strength to have increased by one grade to be able to ambulate  with a hemiwalker/cane to allow one hand to be free to open doors.   Time 6   Period Weeks   PT LONG TERM GOAL #4   Title Pt to be able to pick an item off the floor   Time 6   Period Weeks               Plan - 04/25/15 1442    Clinical Impression Statement Pt demonstrating progress toward mobility, strength, balance, and activity tolerance goals, as evidenced by improved tolerance to walking, increased distance, and less restrictive assistive device. Pt continues to demonstrate noted weakness in hips, demonstrating scissoring during ambulation, and weakness in back extensorts, maintaining trunk flexion during sit-to-stand activity.    Pt will benefit from skilled therapeutic intervention in order to improve on the following deficits Abnormal gait;Decreased activity tolerance;Decreased balance;Decreased mobility;Decreased strength;Difficulty walking   Rehab Potential Good   PT Frequency 2x / week   PT Duration 6 weeks   PT Treatment/Interventions ADLs/Self Care Home Management;Gait training;Stair training;Functional mobility training;Therapeutic activities;Therapeutic exercise;Balance training;Neuromuscular re-education;Patient/family education   PT Next Visit Plan continue to work on cat/camel, spinal extensor strength, standing dynamic balance, and progress ambulation distance/bouts.    PT Home Exercise Plan Cat/Camel for core strength and mobility.    Consulted and Agree with Plan of Care Patient        Problem List Patient Active Problem List   Diagnosis Date Noted  . Bilateral leg weakness 01/24/2015  . Elevated CPK 01/24/2015  . Transaminitis 10/24/2014    Shadavia Dampier C 04/25/2015, 2:48 PM  2:48 PM  Etta Grandchild, PT, DPT [place] License # [id number]       [facility] [address] [place], [place], [address] Phone: [phone]   Fax:  [phone]

## 2015-04-25 NOTE — Patient Instructions (Signed)
   EXTENSION: Standing - Resistance Band: Stable (Active)   Stand, right arm at side. Against yellow resistance band, draw arm backward, as far as possible, keeping elbow straight. Complete __ _12__ repetitions.    INTERNAL ROTATION: Standing - Stable: Exercise Band (Active)   Stand, right arm bent to 90, elbow against side, forearm out from body. Against yellow resistance band, rotate arm in to body, keeping elbow at side. Complete  _12__ repetitions.   Copyright  VHI. All rights reserved.   EXTERNAL ROTATION: Sitting - Resistance Band (Active)   Sit, right arm elbow bent to 90, elbow against side, hand forward. Against yellow resistance band, rotate forearm outward, keeping elbow at side. Rotate forearm outward as far as possible. Complete __ _12__ repetitions.   Copyright  VHI. All rights reserved.  Resistive Band Rowing   With resistive band anchored in door, grasp both ends. Keeping elbows bent, pull back, squeezing shoulder blades together.  Repeat _12___ times.   http://gt2.exer.us/97   Copyright  VHI. All rights reserved.   Strengthening: Resisted Horizontal Abduction   Hold tubing in right hand, elbow straight, arm in, parallel to floor. Pull arm out from side through pain-free range. Repeat _12___ times per set.   http://orth.exer.us/836   Copyright  VHI. All rights reserved.

## 2015-04-25 NOTE — Therapy (Signed)
[place], [place], [address] Phone: [phone]   Fax:  [phone]  Occupational Therapy Treatment  Patient Details  Name: Jacob Hunter MRN: [id number] Date of Birth: 08-05-1956 Referring Provider:  Lawerance Bach, MD  Encounter Date: 04/25/2015      OT End of Session - 04/25/15 1519    Visit Number 6   Number of Visits 12   Date for OT Re-Evaluation 05/30/15   Authorization Type [employer]   OT Start Time 1435   OT Stop Time 1515   OT Time Calculation (min) 40 min   Activity Tolerance Patient tolerated treatment well   Behavior During Therapy [facility] for tasks assessed/performed      Past Medical History  Diagnosis Date  . Hypercholesteremia   . Knee pain   . Arthritis   . Polymyositis     Past Surgical History  Procedure Laterality Date  . Ankle surgery Left   . Knee surgery Left     There were no vitals filed for this visit.  Visit Diagnosis:  Muscle weakness of right arm  Muscle weakness of left arm      Subjective Assessment - 04/25/15 1438    Subjective  S:   Currently in Pain? No/denies            [facility] OT Assessment - 04/25/15 1438    Assessment   Diagnosis Polymyositis-progressive UE weakness   Precautions   Precautions Fall   Precaution Comments No arm weights right now per MD. Enzymes are low.                   OT Treatments/Exercises (OP) - 04/25/15 1438    Exercises   Exercises Shoulder   Shoulder Exercises: Seated   Extension --   Theraband Level (Shoulder Extension) --   Retraction --   Theraband Level (Shoulder Retraction) --   Row --   Theraband Level (Shoulder Row) --   Protraction --   Theraband Level (Shoulder Protraction) --   Horizontal ABduction --   Theraband Level (Shoulder Horizontal ABduction) --   External Rotation --   Theraband Level (Shoulder External Rotation) --   Internal Rotation --   Theraband Level (Shoulder Internal  Rotation) --   Flexion --   Theraband Level (Shoulder Flexion) --   Shoulder Exercises: Standing   Protraction Theraband;12 reps   Theraband Level (Shoulder Protraction) Level 2 (Red)   Horizontal ABduction Theraband;12 reps   Theraband Level (Shoulder Horizontal ABduction) Level 2 (Red)   External Rotation Theraband;12 reps   Theraband Level (Shoulder External Rotation) Level 2 (Red)   Internal Rotation Theraband;12 reps   Theraband Level (Shoulder Internal Rotation) Level 2 (Red)   Flexion Theraband;12 reps   Theraband Level (Shoulder Flexion) Level 2 (Red)   Extension Theraband;12 reps   Theraband Level (Shoulder Extension) Level 2 (Red)   Row Theraband;12 reps   Theraband Level (Shoulder Row) Level 2 (Red)   Retraction Theraband;12 reps   Theraband Level (Shoulder Retraction) Level 2 (Red)   Shoulder Exercises: ROM/Strengthening   UBE (Upper Arm Bike) Level 1 2' forward 2' reverse                OT Short Term Goals - 04/05/15 1503    OT SHORT TERM GOAL #1   Title Pt will be educated on HEP.    Time 3   Period Weeks   Status On-going   OT SHORT TERM GOAL #2  Title Pt will decrease fascial restrictions from max to mod amounts.    Time 3   Period Weeks   Status On-going   OT SHORT TERM GOAL #3   Title Pt will decrease pain to 2/10 during daily tasks.    Time 3   Period Weeks   Status On-going   OT SHORT TERM GOAL #4   Title Pt will increase range of motion to [facility] to increase ability to donn shirts.    Time 3   Period Weeks   Status On-going   OT SHORT TERM GOAL #5   Title Pt will increase strength to 4/5 to increase ability to lift milk out of refridgerator.    Time 3   Period Weeks   Status On-going           OT Long Term Goals - 04/05/15 1503    OT LONG TERM GOAL #1   Title Pt will return to prior level of independence and functioning during daily and work tasks.    Time 6   Period Weeks   Status On-going   OT LONG TERM GOAL #2   Title Pt  will report no pain during daily tasks.    Time 6   Period Weeks   Status On-going   OT LONG TERM GOAL #3   Title Pt will increase AROM to WNL to increase ability to lift plates out of overhead cabinets.    Time 6   Period Weeks   Status On-going   OT LONG TERM GOAL #4   Title Pt will increase strength to 4+/5 to increase ability to complete lifting tasks at work.    Time 6   Period Weeks   Status On-going   OT LONG TERM GOAL #5   Title Pt will decrease fascial restrictions from min to trace amounts or less.    Time 6   Period Weeks   Status On-going               Plan - 04/25/15 1519    Clinical Impression Statement A: Session focused on UB strengthening with use of theraband. Pt required verbal cues for proper form. Increased fatigue noted during certain exercises requiring patient to take rest breaks when needed.    Plan P: Resume shoulder exercises. Follow up on patient's recent blood work and progress back to weights if Dr. has given the ok.         Problem List Patient Active Problem List   Diagnosis Date Noted  . Bilateral leg weakness 01/24/2015  . Elevated CPK 01/24/2015  . Transaminitis 10/24/2014    Ailene Ravel, OTR/L,CBIS  [phone]  04/25/2015, 3:59 PM  [place] [address] [place], [place], [address] Phone: [phone]   Fax:  [phone]

## 2015-04-25 NOTE — Patient Instructions (Signed)
Spinal Mobility (Cat / Camel): Flexion / Extension   Get ON TARGET. Knees and hands on floor/bed.  1.Cat: Buttocks up, arch spine segmentally, bottom to top: lift chest as head moves back, look up. 2.Camel: Reverse movement. Close eyes, lower head, tuck chin, compress chest and abdomen, round back. Hold 3 seconds. Repeat 8-10 times. Do 2 sessions per day.  Copyright  VHI. All rights reserved.

## 2015-04-27 ENCOUNTER — Encounter (HOSPITAL_COMMUNITY): Payer: Self-pay | Admitting: Occupational Therapy

## 2015-04-27 ENCOUNTER — Ambulatory Visit (HOSPITAL_COMMUNITY): Payer: Managed Care, Other (non HMO) | Admitting: Occupational Therapy

## 2015-04-27 ENCOUNTER — Ambulatory Visit (HOSPITAL_COMMUNITY): Payer: Managed Care, Other (non HMO) | Admitting: Physical Therapy

## 2015-04-27 DIAGNOSIS — M6281 Muscle weakness (generalized): Secondary | ICD-10-CM

## 2015-04-27 DIAGNOSIS — Z789 Other specified health status: Secondary | ICD-10-CM

## 2015-04-27 DIAGNOSIS — R2681 Unsteadiness on feet: Secondary | ICD-10-CM

## 2015-04-27 DIAGNOSIS — M25612 Stiffness of left shoulder, not elsewhere classified: Secondary | ICD-10-CM

## 2015-04-27 DIAGNOSIS — M25611 Stiffness of right shoulder, not elsewhere classified: Secondary | ICD-10-CM

## 2015-04-27 NOTE — Therapy (Signed)
[place], [place], [address] Phone: [phone]   Fax:  [phone]  Physical Therapy Treatment  Patient Details  Name: Jacob Hunter MRN: [id number] Date of Birth: 08/19/1955 Referring Provider:  Lawerance Bach, MD  Encounter Date: 04/27/2015      PT End of Session - 04/27/15 1345    Visit Number 8   Number of Visits 16   Date for PT Re-Evaluation 04/29/15   Authorization Type [employer]   Authorization Time Period 02/02/2015-04/04/15   Authorization - Visit Number 8   Authorization - Number of Visits 10   PT Start Time 1300   PT Stop Time 1344   PT Time Calculation (min) 44 min   Activity Tolerance Patient tolerated treatment well      Past Medical History  Diagnosis Date  . Hypercholesteremia   . Knee pain   . Arthritis   . Polymyositis     Past Surgical History  Procedure Laterality Date  . Ankle surgery Left   . Knee surgery Left     There were no vitals filed for this visit.  Visit Diagnosis:  Unsteady gait  Muscle weakness of lower extremity  Decreased activities of daily living (ADL)      Subjective Assessment - 04/27/15 1259    Subjective My knee hurt a little yesterday and today but it is not hurting now.    Currently in Pain? No/denies                [facility] Adult PT Treatment/Exercise - 04/27/15 0001    Exercises   Exercises Lumbar;Knee/Hip   Lumbar Exercises: Supine   Bent Knee Raise 10 reps   Bent Knee Raise Limitations RT stays up and lifts left.    Large Ball Abdominal Isometric 5 reps   Large Ball Abdominal Isometric Limitations altered secondary to not being able to flex both hips simutaneoully.    Large Ball Oblique Isometric 5 reps   Other Supine Lumbar Exercises hip adduction with ball while dorsi/plantarflexion B feet x 20    Lumbar Exercises: Sidelying   Clam 10 reps   Hip Abduction 10 reps   Lumbar Exercises: Quadruped   [place]/Old Horse 10 reps   Straight Leg Raises Limitations attempted 2 reps   Knee/Hip Exercises: Standing   Heel Raises 15 reps   Functional Squat 15 reps   Knee/Hip Exercises: Seated   Other Seated Knee/Hip Exercises diapharamic breathing                   PT Short Term Goals - 03/30/15 1548    PT SHORT TERM GOAL #1   Title I in HEP   Time 1   Period Weeks   PT SHORT TERM GOAL #2   Title Pt to be able to come sit to stand with UE assist but needing no assist from others    Time 3   Period Weeks   PT SHORT TERM GOAL #3   Title Pt to be able to stand for 5 minutes without use of UE for improved balance and decreased risk of falling    Time 3   Period Weeks           PT Long Term Goals - 03/30/15 1550    PT LONG TERM GOAL #1   Title I in advance HEP   Time 6   Period Weeks   PT LONG TERM GOAL #2   Title Pt to be  able to go up and down steps with UE assist    Time 6   Period Weeks   PT LONG TERM GOAL #3   Title Pt strength to have increased by one grade to be able to ambulate with a hemiwalker/cane to allow one hand to be free to open doors.   Time 6   Period Weeks   PT LONG TERM GOAL #4   Title Pt to be able to pick an item off the floor   Time 6   Period Weeks               Plan - 04/27/15 1346    Clinical Impression Statement Todays session focused on core and LE strength with therapist facilitation to ensure core stability while working LE mobility.      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          Problem List Patient Active Problem List   Diagnosis Date Noted  . Bilateral leg weakness 01/24/2015  . Elevated CPK 01/24/2015  . Transaminitis 10/24/2014  Rayetta Humphrey, PT CLT [phone] 04/27/2015, 1:49 PM  [facility] [address] [place], [place], [address] Phone: [phone]   Fax:  [phone]

## 2015-04-27 NOTE — Therapy (Signed)
[place], [place], [address] Phone: [phone]   Fax:  [phone]  Occupational Therapy Treatment  Patient Details  Name: Jacob Hunter MRN: [id number] Date of Birth: Oct 24, 1955 Referring Provider:  Lawerance Bach, MD  Encounter Date: 04/27/2015      OT End of Session - 04/27/15 1641    Visit Number 7   Number of Visits 12   Date for OT Re-Evaluation 05/30/15   Authorization Type [employer]   OT Start Time 1350   OT Stop Time 1432   OT Time Calculation (min) 42 min   Activity Tolerance Patient tolerated treatment well   Behavior During Therapy [facility] for tasks assessed/performed      Past Medical History  Diagnosis Date  . Hypercholesteremia   . Knee pain   . Arthritis   . Polymyositis     Past Surgical History  Procedure Laterality Date  . Ankle surgery Left   . Knee surgery Left     There were no vitals filed for this visit.  Visit Diagnosis:  Muscle weakness of left arm  Muscle weakness of right arm  Stiffness of shoulder joint, left  Stiffness of shoulder joint, right      Subjective Assessment - 04/27/15 1642    Subjective  S: I won't know about my blood work until Monday.    Currently in Pain? No/denies            [facility] OT Assessment - 04/27/15 1349    Assessment   Diagnosis Polymyositis-progressive UE weakness   Precautions   Precautions Fall   Precaution Comments No arm weights right now per MD. Enzymes are low.                   OT Treatments/Exercises (OP) - 04/27/15 1351    Exercises   Exercises Shoulder   Shoulder Exercises: Supine   Protraction AROM  30 reps   Horizontal ABduction AROM  30 reps   External Rotation AROM  30 reps   Internal Rotation AROM  30 reps   Flexion AROM  30 reps   ABduction AROM  30 reps   Shoulder Exercises: Seated   Protraction AROM;15 reps   Horizontal ABduction AROM;15 reps   External Rotation AROM;15 reps   Internal Rotation AROM;15 reps   Flexion AROM;15 reps   Abduction AROM;15 reps   Shoulder Exercises: Standing   Extension Theraband;12 reps   Theraband Level (Shoulder Extension) Level 2 (Red)   Row Theraband;12 reps   Theraband Level (Shoulder Row) Level 2 (Red)   Retraction Theraband;12 reps   Theraband Level (Shoulder Retraction) Level 2 (Red)   Shoulder Exercises: ROM/Strengthening   UBE (Upper Arm Bike) Level 1 2' forward 2' reverse   Over Head Lace 1'30"   Proximal Shoulder Strengthening, Supine 20X with rest breaks   Proximal Shoulder Strengthening, Seated 10X each with rest breaks                  OT Short Term Goals - 04/05/15 1503    OT SHORT TERM GOAL #1   Title Pt will be educated on HEP.    Time 3   Period Weeks   Status On-going   OT SHORT TERM GOAL #2   Title Pt will decrease fascial restrictions from max to mod amounts.    Time 3   Period Weeks   Status On-going   OT SHORT TERM GOAL #3   Title Pt  will decrease pain to 2/10 during daily tasks.    Time 3   Period Weeks   Status On-going   OT SHORT TERM GOAL #4   Title Pt will increase range of motion to [facility] to increase ability to donn shirts.    Time 3   Period Weeks   Status On-going   OT SHORT TERM GOAL #5   Title Pt will increase strength to 4/5 to increase ability to lift milk out of refridgerator.    Time 3   Period Weeks   Status On-going           OT Long Term Goals - 04/05/15 1503    OT LONG TERM GOAL #1   Title Pt will return to prior level of independence and functioning during daily and work tasks.    Time 6   Period Weeks   Status On-going   OT LONG TERM GOAL #2   Title Pt will report no pain during daily tasks.    Time 6   Period Weeks   Status On-going   OT LONG TERM GOAL #3   Title Pt will increase AROM to WNL to increase ability to lift plates out of overhead cabinets.    Time 6   Period Weeks   Status On-going   OT LONG TERM GOAL #4   Title Pt will increase  strength to 4+/5 to increase ability to complete lifting tasks at work.    Time 6   Period Weeks   Status On-going   OT LONG TERM GOAL #5   Title Pt will decrease fascial restrictions from min to trace amounts or less.    Time 6   Period Weeks   Status On-going               Plan - 04/27/15 1641    Clinical Impression Statement A: Resumed shoulder exercises this session, increased repetitions to 30, in supine, per pt request. Pt reports he will not know if he can use weights again until Monday. Pt with moderate fatigue at end of session.    Plan P: Follow up on pt blood work & if he can resume weights. Cont UB strengthening focusing on proper form         Problem List Patient Active Problem List   Diagnosis Date Noted  . Bilateral leg weakness 01/24/2015  . Elevated CPK 01/24/2015  . Transaminitis 10/24/2014    Jacob Hunter, OTR/L  [phone]  04/27/2015, 4:43 PM  [place] [address] [place], [place], [address] Phone: [phone]   Fax:  [phone]

## 2015-05-01 ENCOUNTER — Telehealth (HOSPITAL_COMMUNITY): Payer: Self-pay | Admitting: Physical Therapy

## 2015-05-01 ENCOUNTER — Telehealth (HOSPITAL_COMMUNITY): Payer: Self-pay

## 2015-05-01 NOTE — Telephone Encounter (Signed)
Pt has to go to [facility] for a week will return for tx on Monday 05-08-2015

## 2015-05-01 NOTE — Telephone Encounter (Signed)
Pt has to go to [facility] for a week will return for tx on Monday 05-08-2015

## 2015-05-02 ENCOUNTER — Encounter (HOSPITAL_COMMUNITY): Payer: Managed Care, Other (non HMO) | Admitting: Occupational Therapy

## 2015-05-02 ENCOUNTER — Encounter (HOSPITAL_COMMUNITY): Payer: Managed Care, Other (non HMO) | Admitting: Physical Therapy

## 2015-05-04 ENCOUNTER — Encounter (HOSPITAL_COMMUNITY): Payer: Managed Care, Other (non HMO)

## 2015-05-08 ENCOUNTER — Ambulatory Visit (HOSPITAL_COMMUNITY): Payer: Managed Care, Other (non HMO)

## 2015-05-08 ENCOUNTER — Encounter (HOSPITAL_COMMUNITY): Payer: Self-pay

## 2015-05-08 ENCOUNTER — Ambulatory Visit (HOSPITAL_COMMUNITY): Payer: Managed Care, Other (non HMO) | Admitting: Physical Therapy

## 2015-05-08 DIAGNOSIS — R2681 Unsteadiness on feet: Secondary | ICD-10-CM

## 2015-05-08 DIAGNOSIS — M6281 Muscle weakness (generalized): Secondary | ICD-10-CM | POA: Diagnosis not present

## 2015-05-08 DIAGNOSIS — Z789 Other specified health status: Secondary | ICD-10-CM

## 2015-05-08 NOTE — Therapy (Signed)
[place] [address] [place], [place], [address] Phone: [phone]   Fax:  [phone]  Physical Therapy Treatment (Re-Assessment)  Patient Details  Name: Jacob Hunter MRN: [id number] Date of Birth: 1956/07/29 Referring Provider:  Lawerance Bach, MD  Encounter Date: 05/08/2015      PT End of Session - 05/08/15 1439    Visit Number 9   Number of Visits 16   Date for PT Re-Evaluation 06/05/15   Authorization Type [employer]   Authorization Time Period 02/02/2015-04/04/15   Authorization - Visit Number 9   Authorization - Number of Visits 10   PT Start Time 1346   PT Stop Time 1430   PT Time Calculation (min) 44 min   Activity Tolerance Patient tolerated treatment well   Behavior During Therapy [facility] for tasks assessed/performed      Past Medical History  Diagnosis Date  . Hypercholesteremia   . Knee pain   . Arthritis   . Polymyositis     Past Surgical History  Procedure Laterality Date  . Ankle surgery Left   . Knee surgery Left     There were no vitals filed for this visit.  Visit Diagnosis:  Unsteady gait - Plan: PT plan of care cert/re-cert  Muscle weakness of lower extremity - Plan: PT plan of care cert/re-cert  Decreased activities of daily living (ADL) - Plan: PT plan of care cert/re-cert      Subjective Assessment - 05/08/15 1348    Subjective Patient arrived with his wife today. Reports that he has been in the hospital for the last week to have IvIG treatments for 5 days; reports that he is feeling much better after receiving this. Arrived without any assistive device today. No further IVIG scheduled rightt now; CK enzymes are down right now.    Patient is accompained by: Family member   Pertinent History History of L knee scope (07/2013) and L ankle reconstructive surgeries (2012ish) per patient.     How long can you stand comfortably? 9/26- not sure yet but feels that this is improving, not sure after  IVIG    How long can you walk comfortably? 9/26-  has been walking without walker for a couple of weeks now, not sure yet after IVIG    Patient Stated Goals Return of normal strength in BLE (especailly hip flexion).   Currently in Pain? No/denies            [facility] PT Assessment - 05/08/15 0001    Assessment   Medical Diagnosis Polymyositis   Onset Date/Surgical Date 01/24/15   Hand Dominance Right   Precautions   Precautions Fall   Balance Screen   Has the patient fallen in the past 6 months Yes   How many times? 3   Has the patient had a decrease in activity level because of a fear of falling?  Yes   Is the patient reluctant to leave their home because of a fear of falling?  No   Prior Function   Level of Independence Independent   Vocation Full time employment   Vocation Requirements walking, movement, runs two machines, bending, mild repetitive lifting    Leisure hunting, golf, beach going, fishing   Observation/Other Assessments   Observations Prone floor to stand with Min guard x2    Strength   Right Hip Flexion 3/5   Right Hip Extension 2+/5   Right Hip ABduction 3-/5   Left Hip Flexion 3-/5   Left  Hip Extension 2+/5   Left Hip ABduction 2/5   Right Knee Flexion 4/5   Right Knee Extension 4+/5   Left Knee Flexion 5/5   Left Knee Extension 5/5   Right Ankle Dorsiflexion 5/5   Left Ankle Dorsiflexion 5/5   Ambulation/Gait   Gait Comments 6MWT approx 1258ft, 1 very small rest break, no device    Berg Balance Test   Sit to Stand Able to stand without using hands and stabilize independently   Standing Unsupported Able to stand safely 2 minutes   Sitting with Back Unsupported but Feet Supported on Floor or Stool Able to sit safely and securely 2 minutes   Stand to Sit Sits safely with minimal use of hands   Transfers Able to transfer safely, minor use of hands   Standing Unsupported with Eyes Closed Able to stand 10 seconds safely   Standing Ubsupported with Feet  Together Able to place feet together independently and stand 1 minute safely   From Standing, Reach Forward with Outstretched Arm Can reach confidently >25 cm (10")   From Standing Position, Pick up Object from Floor Able to pick up shoe safely and easily   From Standing Position, Turn to Look Behind Over each Shoulder Looks behind from both sides and weight shifts well   Turn 360 Degrees Able to turn 360 degrees safely in 4 seconds or less   Standing Unsupported, Alternately Place Feet on Step/Stool Able to stand independently and safely and complete 8 steps in 20 seconds   Standing Unsupported, One Foot in Front Able to plae foot ahead of the other independently and hold 30 seconds   Standing on One Leg Able to lift leg independently and hold equal to or more than 3 seconds   Total Score 53                             PT Education - 05/08/15 1438    Education provided Yes   Education Details progress with skilled PT services, plan of care moving forward, safe mechanics for floor to stand    Person(s) Educated Patient   Methods Explanation   Comprehension Verbalized understanding          PT Short Term Goals - 05/08/15 1416    PT SHORT TERM GOAL #1   Title I in [place] #2   Title Pt to be able to come sit to stand with UE assist but needing no assist from others    Time 3   Period Weeks   Status Achieved   PT SHORT TERM GOAL #3   Title Pt to be able to stand for 5 minutes without use of UE for improved balance and decreased risk of falling    Time 3   Period Weeks   Status Achieved           PT Long Term Goals - 05/08/15 1420    PT LONG TERM GOAL #1   Title I in advance HEP   Time 6   Period Weeks   Status On-going   PT LONG TERM GOAL #2   Title Pt to be able to go up and down steps with UE assist    Time 6   Period Weeks   Status Achieved   PT LONG TERM GOAL #3   Title Pt strength to have increased by one grade to be able  to ambulate with  a hemiwalker/cane to allow one hand to be free to open doors.   Time 6   Period Weeks   Status Partially Met   PT LONG TERM GOAL #4   Title Pt to be able to pick an item off the floor   Time 6   Period Weeks   Status Achieved   PT LONG TERM GOAL #5   Title Patient will able to get up off of floor on an independent basis in a safe fashion and with minimal fatigue and fall risk    Time 6   Period Weeks   Status New   Additional Long Term Goals   Additional Long Term Goals Yes   PT LONG TERM GOAL #6   Title Patient will be able ascend and descend ladders in a safe fashion, minimal unsteadiness, low fall risk    Time 6   Period Weeks   Status New   PT LONG TERM GOAL #7   Title Patient will demonstrate improved aerobic capacity by demonstrating a toelrance of at least 8 minutes on elliptical machine    Time 6   Period Weeks   Status New               Plan - 05/08/15 1439    Clinical Impression Statement Re-assessment performed today. Patient arrives after having received 5 days of IVIG treatments from his MD; his wife was present and reports that MD has cleared the patient to perform exercises with no more than 5# weights, and would lke the patient to start  trying more aerobic activities. At this point patient does continue to demonstrate poor proximal and core sterngth, reduced functional activity tolerance, difficulty with tasks like floor to stand, and reduced ability to perform functional tasks such as ladder climbing for work. MD at this point wuold also like PT to continue, does not want patient DC-ed. At this point patient will benefit from skilled PT services to acheive goals yet unmet and to assist him in reaching  an optimal level of function.    Pt will benefit from skilled therapeutic intervention in order to improve on the following deficits Abnormal gait;Decreased activity tolerance;Decreased balance;Decreased mobility;Decreased strength;Difficulty  walking   Rehab Potential Good   PT Frequency 1x / week   PT Duration 6 weeks   PT Treatment/Interventions ADLs/Self Care Home Management;Gait training;Stair training;Functional mobility training;Therapeutic activities;Therapeutic exercise;Balance training;Neuromuscular re-education;Patient/family education   PT Next Visit Plan contnue to work on core strength begin Pension scheme manager.  Work on floor to stand and Academic librarian.    PT Home Exercise Plan Cat/Camel for core strength and mobility.    Consulted and Agree with Plan of Care Patient        Problem List Patient Active Problem List   Diagnosis Date Noted  . Bilateral leg weakness 01/24/2015  . Elevated CPK 01/24/2015  . Transaminitis 10/24/2014    Physical Therapy Progress Note  Dates of Reporting Period: 03/30/15 to 05/08/15  Objective Reports of Subjective Statement: see above   Objective Measurements: see above   Goal Update: see above   Plan: see above   Reason Skilled Services are Required: focus on core strength, functional tasks such as floor to stand and ladders, aerobic training as tolerated    Deniece Ree PT, DPT [place], [place], [address] Phone: [phone]   Fax:  [phone]

## 2015-05-08 NOTE — Therapy (Signed)
[place], [place], [address] Phone: [phone]   Fax:  [phone]  Occupational Therapy Treatment  Patient Details  Name: Jacob Hunter MRN: [id number] Date of Birth: 23-Oct-1955 Referring Provider:  Lawerance Bach, MD  Encounter Date: 05/08/2015      OT End of Session - 05/08/15 1543    Visit Number 8   Number of Visits 12   Date for OT Re-Evaluation 05/30/15   Authorization Type [employer]   OT Start Time 1430   OT Stop Time 1517   OT Time Calculation (min) 47 min   Activity Tolerance Patient tolerated treatment well   Behavior During Therapy [facility] for tasks assessed/performed      Past Medical History  Diagnosis Date  . Hypercholesteremia   . Knee pain   . Arthritis   . Polymyositis     Past Surgical History  Procedure Laterality Date  . Ankle surgery Left   . Knee surgery Left     There were no vitals filed for this visit.  Visit Diagnosis:  Muscle weakness of left arm  Muscle weakness of right arm      Subjective Assessment - 05/08/15 1436    Subjective  S: I am able to do weights now.    Patient is accompained by: Family member   Currently in Pain? No/denies            [facility] OT Assessment - 05/08/15 1438    Assessment   Diagnosis Polymyositis-progressive UE weakness   Precautions   Precautions Fall   Precaution Comments Ok to complete weights. No heavier than 5#                  OT Treatments/Exercises (OP) - 05/08/15 1439    Exercises   Exercises Shoulder   Shoulder Exercises: Supine   Protraction Strengthening;12 reps   Protraction Weight (lbs) 1   Horizontal ABduction Strengthening;12 reps   Horizontal ABduction Weight (lbs) 1   External Rotation Strengthening;12 reps   External Rotation Weight (lbs) 1   Internal Rotation Strengthening;12 reps   Internal Rotation Weight (lbs) 1   Flexion Strengthening;12 reps   Shoulder Flexion Weight (lbs) 1   ABduction Strengthening;12 reps   Shoulder ABduction Weight (lbs) 1   Shoulder Exercises: Standing   Protraction Theraband;12 reps   Theraband Level (Shoulder Protraction) Level 2 (Red)   Horizontal ABduction Theraband;12 reps   Theraband Level (Shoulder Horizontal ABduction) Level 2 (Red)   External Rotation Theraband;12 reps   Theraband Level (Shoulder External Rotation) Level 2 (Red)   Internal Rotation Theraband;12 reps   Theraband Level (Shoulder Internal Rotation) Level 2 (Red)   Extension Theraband;12 reps   Theraband Level (Shoulder Extension) Level 2 (Red)   Row Theraband;12 reps   Theraband Level (Shoulder Row) Level 2 (Red)   Retraction Theraband;12 reps   Theraband Level (Shoulder Retraction) Level 2 (Red)   Other Standing Exercises Lawnmower 12X; red band   Shoulder Exercises: ROM/Strengthening   Cybex Press 2 plate;15 reps   Cybex Row 2 plate;15 reps   Over Head Lace 1'30"   X to V Arms 12X with1#   Proximal Shoulder Strengthening, Supine 12X with 1# no rest breaks   Proximal Shoulder Strengthening, Seated 10X with1# with 1 rest break   Ball on Wall 1' flexion 1' abduction BUE                OT Education - 05/08/15 1542  Education provided Yes   Education Details Reviewed current HEP and recommended which exercises to complete at home.    Person(s) Educated Patient;Spouse   Methods Explanation   Comprehension Verbalized understanding          OT Short Term Goals - 04/05/15 1503    OT SHORT TERM GOAL #1   Title Pt will be educated on HEP.    Time 3   Period Weeks   Status On-going   OT SHORT TERM GOAL #2   Title Pt will decrease fascial restrictions from max to mod amounts.    Time 3   Period Weeks   Status On-going   OT SHORT TERM GOAL #3   Title Pt will decrease pain to 2/10 during daily tasks.    Time 3   Period Weeks   Status On-going   OT SHORT TERM GOAL #4   Title Pt will increase range of motion to [facility] to increase ability to donn  shirts.    Time 3   Period Weeks   Status On-going   OT SHORT TERM GOAL #5   Title Pt will increase strength to 4/5 to increase ability to lift milk out of refridgerator.    Time 3   Period Weeks   Status On-going           OT Long Term Goals - 04/05/15 1503    OT LONG TERM GOAL #1   Title Pt will return to prior level of independence and functioning during daily and work tasks.    Time 6   Period Weeks   Status On-going   OT LONG TERM GOAL #2   Title Pt will report no pain during daily tasks.    Time 6   Period Weeks   Status On-going   OT LONG TERM GOAL #3   Title Pt will increase AROM to WNL to increase ability to lift plates out of overhead cabinets.    Time 6   Period Weeks   Status On-going   OT LONG TERM GOAL #4   Title Pt will increase strength to 4+/5 to increase ability to complete lifting tasks at work.    Time 6   Period Weeks   Status On-going   OT LONG TERM GOAL #5   Title Pt will decrease fascial restrictions from min to trace amounts or less.    Time 6   Period Weeks   Status On-going               Plan - 05/08/15 1543    Clinical Impression Statement A: Pt reports that he is now able to use weights with nothing heavier than 5#. Wife present during session. Pt completed all strengthening exercises with verbal cues for form and technique.    Plan P: Add total gym and core strengthening.         Problem List Patient Active Problem List   Diagnosis Date Noted  . Bilateral leg weakness 01/24/2015  . Elevated CPK 01/24/2015  . Transaminitis 10/24/2014    Ailene Ravel, OTR/L,CBIS  [phone]  05/08/2015, 3:45 PM  [place] [address] [place], [place], [address] Phone: [phone]   Fax:  [phone]

## 2015-05-09 ENCOUNTER — Encounter (HOSPITAL_COMMUNITY): Payer: Self-pay

## 2015-05-09 ENCOUNTER — Ambulatory Visit (HOSPITAL_COMMUNITY): Payer: Managed Care, Other (non HMO)

## 2015-05-09 ENCOUNTER — Ambulatory Visit (HOSPITAL_COMMUNITY): Payer: Managed Care, Other (non HMO) | Admitting: Physical Therapy

## 2015-05-09 DIAGNOSIS — R2681 Unsteadiness on feet: Secondary | ICD-10-CM

## 2015-05-09 DIAGNOSIS — M6281 Muscle weakness (generalized): Secondary | ICD-10-CM | POA: Diagnosis not present

## 2015-05-09 DIAGNOSIS — Z789 Other specified health status: Secondary | ICD-10-CM

## 2015-05-09 NOTE — Therapy (Signed)
[place], [place], [address] Phone: [phone]   Fax:  [phone]  Occupational Therapy Treatment  Patient Details  Name: Jacob Hunter MRN: [id number] Date of Birth: 1956/02/16 Referring Provider:  Lawerance Bach, MD  Encounter Date: 05/09/2015      OT End of Session - 05/09/15 1430    Visit Number 9   Number of Visits 12   Date for OT Re-Evaluation 05/30/15   Authorization Type [employer]   OT Start Time 1346   OT Stop Time 1430   OT Time Calculation (min) 44 min   Activity Tolerance Patient tolerated treatment well   Behavior During Therapy [facility] for tasks assessed/performed      Past Medical History  Diagnosis Date  . Hypercholesteremia   . Knee pain   . Arthritis   . Polymyositis     Past Surgical History  Procedure Laterality Date  . Ankle surgery Left   . Knee surgery Left     There were no vitals filed for this visit.  Visit Diagnosis:  Muscle weakness of left arm  Muscle weakness of right arm      Subjective Assessment - 05/09/15 1351    Subjective  S: The exercises at the end made me the most tired last time.    Currently in Pain? No/denies            [facility] OT Assessment - 05/09/15 1355    Assessment   Diagnosis Polymyositis-progressive UE weakness   Precautions   Precautions Fall   Precaution Comments Ok to complete weights. No heavier than 5#                  OT Treatments/Exercises (OP) - 05/09/15 0001    Exercises   Exercises Shoulder;Work Hardening   Shoulder Exercises: Standing   Horizontal ABduction Theraband;12 reps   Theraband Level (Shoulder Horizontal ABduction) Level 2 (Red)   Other Standing Exercises Extended arm curl with red theraband. both arm completed. Hand supinated and pronated. 12X   Shoulder Exercises: ROM/Strengthening   UBE (Upper Arm Bike) Level 2 2' forward 2' reverse  focused on statying 7.0 KPH to conserve energy. Cues needed.    Cybex Press 2 plate;15 reps   Cybex Row 2 plate;15 reps   Proximal Shoulder Strengthening, Supine 12X with 1# no rest breaks   Proximal Shoulder Strengthening, Seated 10X with1# with no rest break   Neurological Re-education Exercises   Trunk Exercises Core Activation;Rotation   Trunk Rotation Seated russian twist with yellow weighted ball on edge of mat; 10X   Trunk Core Activation sit ups using rope for assist. slow up and down; 10X                OT Education - 05/08/15 1542    Education provided Yes   Education Details Reviewed current HEP and recommended which exercises to complete at home.    Person(s) Educated Patient;Spouse   Methods Explanation   Comprehension Verbalized understanding          OT Short Term Goals - 04/05/15 1503    OT SHORT TERM GOAL #1   Title Pt will be educated on HEP.    Time 3   Period Weeks   Status On-going   OT SHORT TERM GOAL #2   Title Pt will decrease fascial restrictions from max to mod amounts.    Time 3   Period Weeks   Status On-going   OT  SHORT TERM GOAL #3   Title Pt will decrease pain to 2/10 during daily tasks.    Time 3   Period Weeks   Status On-going   OT SHORT TERM GOAL #4   Title Pt will increase range of motion to [facility] to increase ability to donn shirts.    Time 3   Period Weeks   Status On-going   OT SHORT TERM GOAL #5   Title Pt will increase strength to 4/5 to increase ability to lift milk out of refridgerator.    Time 3   Period Weeks   Status On-going           OT Long Term Goals - 04/05/15 1503    OT LONG TERM GOAL #1   Title Pt will return to prior level of independence and functioning during daily and work tasks.    Time 6   Period Weeks   Status On-going   OT LONG TERM GOAL #2   Title Pt will report no pain during daily tasks.    Time 6   Period Weeks   Status On-going   OT LONG TERM GOAL #3   Title Pt will increase AROM to WNL to increase ability to lift plates out of overhead  cabinets.    Time 6   Period Weeks   Status On-going   OT LONG TERM GOAL #4   Title Pt will increase strength to 4+/5 to increase ability to complete lifting tasks at work.    Time 6   Period Weeks   Status On-going   OT LONG TERM GOAL #5   Title Pt will decrease fascial restrictions from min to trace amounts or less.    Time 6   Period Weeks   Status On-going               Plan - 05/09/15 1430    Clinical Impression Statement A: Added core strengthening exercises this session. Pt completed with modfications and with cueing for proper form and technique. Unable to complete total gym exercises as station was out of order.    Plan P: Mini reassess. Add total gym.        Problem List Patient Active Problem List   Diagnosis Date Noted  . Bilateral leg weakness 01/24/2015  . Elevated CPK 01/24/2015  . Transaminitis 10/24/2014    Ailene Ravel, OTR/L,CBIS  [phone]  05/09/2015, 2:32 PM  [place] [address] [place], [place], [address] Phone: [phone]   Fax:  [phone]

## 2015-05-09 NOTE — Therapy (Signed)
[place], [place], [address] Phone: [phone]   Fax:  [phone]  Physical Therapy Treatment  Patient Details  Name: Jacob Hunter MRN: [id number] Date of Birth: Nov 22, 1955 Referring Provider:  Lawerance Bach, MD  Encounter Date: 05/09/2015      PT End of Session - 05/09/15 1633    Visit Number 10   Number of Visits 16   Date for PT Re-Evaluation 06/05/15   Authorization Type [employer]   Authorization - Visit Number 10   Authorization - Number of Visits 16   PT Start Time 1302   PT Stop Time 1345   PT Time Calculation (min) 43 min   Equipment Utilized During Treatment Gait belt   Activity Tolerance Patient tolerated treatment well   Behavior During Therapy [facility] for tasks assessed/performed      Past Medical History  Diagnosis Date  . Hypercholesteremia   . Knee pain   . Arthritis   . Polymyositis     Past Surgical History  Procedure Laterality Date  . Ankle surgery Left   . Knee surgery Left     There were no vitals filed for this visit.  Visit Diagnosis:  Unsteady gait  Muscle weakness of lower extremity  Decreased activities of daily living (ADL)  Muscle weakness of left arm  Muscle weakness of right arm      Subjective Assessment - 05/09/15 1629    Subjective PT states that they have changed his diagnosis.  States he already feels better.     Currently in Pain? No/denies                         [facility] Adult PT Treatment/Exercise - 05/09/15 0001    Exercises   Exercises Lumbar;Knee/Hip   Lumbar Exercises: Seated   Other Seated Lumbar Exercises Balance master Test for both limits of stability and static.    Lumbar Exercises: Supine   Bent Knee Raise 10 reps   Bent Knee Raise Limitations --   Bridge 10 reps   Straight Leg Raise 10 reps   Straight Leg Raises Limitations floating    Large Ball Abdominal Isometric 10 reps   Large Ball Abdominal Isometric  Limitations altered secondary to not being able to flex both hips simutaneoully.    Large Ball Oblique Isometric 5 reps   Other Supine Lumbar Exercises hip adduction with ball while dorsi/plantarflexion B feet x 20    Lumbar Exercises: Sidelying   Clam 10 reps   Hip Abduction 10 reps   Hip Abduction Weights (lbs) 3   Lumbar Exercises: Prone   Other Prone Lumbar Exercises knee flexion 3# x 10    Lumbar Exercises: Quadruped   Madcat/Old Horse 10 reps   Straight Leg Raise 10 reps   Straight Leg Raises Limitations supported by greenball   Opposite Arm/Leg Raise Right arm/Left leg;Left arm/Right leg;5 reps   Opposite Arm/Leg Raise Limitations supported by green ball   Knee/Hip Exercises: Standing   Heel Raises 15 reps   Functional Squat 15 reps   Knee/Hip Exercises: Seated   Other Seated Knee/Hip Exercises diapharamic breathing                 PT Education - 05/08/15 1438    Education provided Yes   Education Details progress with skilled PT services, plan of care moving forward, safe mechanics for floor to stand    Person(s) Educated Patient  Methods Explanation   Comprehension Verbalized understanding          PT Short Term Goals - 05/08/15 1416    PT SHORT TERM GOAL #1   Title I in HEP   PT SHORT TERM GOAL #2   Title Pt to be able to come sit to stand with UE assist but needing no assist from others    Time 3   Period Weeks   Status Achieved   PT SHORT TERM GOAL #3   Title Pt to be able to stand for 5 minutes without use of UE for improved balance and decreased risk of falling    Time 3   Period Weeks   Status Achieved           PT Long Term Goals - 05/08/15 1420    PT LONG TERM GOAL #1   Title I in advance HEP   Time 6   Period Weeks   Status On-going   PT LONG TERM GOAL #2   Title Pt to be able to go up and down steps with UE assist    Time 6   Period Weeks   Status Achieved   PT LONG TERM GOAL #3   Title Pt strength to have increased by one  grade to be able to ambulate with a hemiwalker/cane to allow one hand to be free to open doors.   Time 6   Period Weeks   Status Partially Met   PT LONG TERM GOAL #4   Title Pt to be able to pick an item off the floor   Time 6   Period Weeks   Status Achieved   PT LONG TERM GOAL #5   Title Patient will able to get up off of floor on an independent basis in a safe fashion and with minimal fatigue and fall risk    Time 6   Period Weeks   Status New   Additional Long Term Goals   Additional Long Term Goals Yes   PT LONG TERM GOAL #6   Title Patient will be able ascend and descend ladders in a safe fashion, minimal unsteadiness, low fall risk    Time 6   Period Weeks   Status New   PT LONG TERM GOAL #7   Title Patient will demonstrate improved aerobic capacity by demonstrating a toelrance of at least 8 minutes on elliptical machine    Time 6   Period Weeks   Status New               Plan - 05/09/15 1634    Clinical Impression Statement Added mad cat/old horse, placed ball under stomach in quadriped to increase stability.  This improved stability to allow pt to complete quadriped activities.  Added weight for hamcurls abduction with good form.    PT Home Exercise Plan reintroduce standing activities.         Problem List Patient Active Problem List   Diagnosis Date Noted  . Bilateral leg weakness 01/24/2015  . Elevated CPK 01/24/2015  . Transaminitis 10/24/2014    Rayetta Humphrey, PT CLT [phone] 05/09/2015, 4:38 PM  [place] [address] [place], [place], [address] Phone: [phone]   Fax:  [phone]

## 2015-05-10 ENCOUNTER — Encounter (HOSPITAL_COMMUNITY): Payer: Managed Care, Other (non HMO) | Admitting: Physical Therapy

## 2015-05-16 ENCOUNTER — Encounter (HOSPITAL_COMMUNITY): Payer: Managed Care, Other (non HMO) | Admitting: Physical Therapy

## 2015-05-16 ENCOUNTER — Encounter (HOSPITAL_COMMUNITY): Payer: Managed Care, Other (non HMO)

## 2015-05-18 ENCOUNTER — Encounter (HOSPITAL_COMMUNITY): Payer: Managed Care, Other (non HMO) | Admitting: Physical Therapy

## 2015-05-18 ENCOUNTER — Encounter (HOSPITAL_COMMUNITY): Payer: Managed Care, Other (non HMO)

## 2015-05-19 ENCOUNTER — Ambulatory Visit (HOSPITAL_COMMUNITY): Payer: Managed Care, Other (non HMO) | Attending: Internal Medicine | Admitting: Occupational Therapy

## 2015-05-19 ENCOUNTER — Encounter (HOSPITAL_COMMUNITY): Payer: Self-pay | Admitting: Occupational Therapy

## 2015-05-19 ENCOUNTER — Ambulatory Visit (HOSPITAL_COMMUNITY): Payer: Managed Care, Other (non HMO) | Admitting: Physical Therapy

## 2015-05-19 DIAGNOSIS — M25612 Stiffness of left shoulder, not elsewhere classified: Secondary | ICD-10-CM | POA: Diagnosis present

## 2015-05-19 DIAGNOSIS — Z789 Other specified health status: Secondary | ICD-10-CM | POA: Diagnosis present

## 2015-05-19 DIAGNOSIS — M25611 Stiffness of right shoulder, not elsewhere classified: Secondary | ICD-10-CM | POA: Diagnosis present

## 2015-05-19 DIAGNOSIS — R2681 Unsteadiness on feet: Secondary | ICD-10-CM | POA: Diagnosis present

## 2015-05-19 DIAGNOSIS — M6281 Muscle weakness (generalized): Secondary | ICD-10-CM | POA: Insufficient documentation

## 2015-05-19 NOTE — Therapy (Signed)
[place], [place], [address] Phone: [phone]   Fax:  [phone]  Physical Therapy Treatment  Patient Details  Name: Jacob Hunter MRN: [id number] Date of Birth: [birth year] Referring Provider:  Lawerance Bach, MD  Encounter Date: 05/19/2015      PT End of Session - 05/19/15 1520    Visit Number 11   Number of Visits 16   Date for PT Re-Evaluation 06/05/15   Authorization Type [employer]   Authorization Time Period 02/02/2015-04/04/15   Authorization - Visit Number 11   Authorization - Number of Visits 16   PT Start Time 1430   PT Stop Time 1513   PT Time Calculation (min) 43 min   Activity Tolerance Patient tolerated treatment well      Past Medical History  Diagnosis Date  . Hypercholesteremia   . Knee pain   . Arthritis   . Polymyositis [facility])     Past Surgical History  Procedure Laterality Date  . Ankle surgery Left   . Knee surgery Left     There were no vitals filed for this visit.  Visit Diagnosis:  Muscle weakness of left arm  Muscle weakness of right arm  Unsteady gait  Muscle weakness of lower extremity  Decreased activities of daily living (ADL)      Subjective Assessment - 05/19/15 1447    Subjective Pt states that his LT knee is bothering him.  States he has increased pain when he overexerts.  He states that getting up from the ground is the most difficulty thing for him to do    Currently in Pain? Yes   Pain Score 5    Pain Location Knee   Pain Orientation Left                         [facility] Adult PT Treatment/Exercise - 05/19/15 1450    Lumbar Exercises: Standing   Other Standing Lumbar Exercises tandem stance B 30" x 3   Lumbar Exercises: Seated   Other Seated Lumbar Exercises tandem stance x 30 "    Lumbar Exercises: Supine   Bridge 5 reps   Bridge Limitations 4 count holding at each level    Other Supine Lumbar Exercises --   Lumbar  Exercises: Sidelying   Clam 10 reps   Hip Abduction 10 reps   Hip Abduction Weights (lbs) 3   Hip Abduction Limitations up to a count of 4 holding each count     Other Sidelying Lumbar Exercises leg circles forward and backward; knee to chest all x 10 reps    Lumbar Exercises: Quadruped   Madcat/Old Horse 10 reps   Opposite Arm/Leg Raise Right arm/Left leg;Left arm/Right leg;10 reps                  PT Short Term Goals - 05/08/15 1416    PT SHORT TERM GOAL #1   Title I in HEP   PT SHORT TERM GOAL #2   Title Pt to be able to come sit to stand with UE assist but needing no assist from others    Time 3   Period Weeks   Status Achieved   PT SHORT TERM GOAL #3   Title Pt to be able to stand for 5 minutes without use of UE for improved balance and decreased risk of falling    Time 3   Period Weeks   Status Achieved  PT Long Term Goals - 05/08/15 1420    PT LONG TERM GOAL #1   Title I in advance HEP   Time 6   Period Weeks   Status On-going   PT LONG TERM GOAL #2   Title Pt to be able to go up and down steps with UE assist    Time 6   Period Weeks   Status Achieved   PT LONG TERM GOAL #3   Title Pt strength to have increased by one grade to be able to ambulate with a hemiwalker/cane to allow one hand to be free to open doors.   Time 6   Period Weeks   Status Partially Met   PT LONG TERM GOAL #4   Title Pt to be able to pick an item off the floor   Time 6   Period Weeks   Status Achieved   PT LONG TERM GOAL #5   Title Patient will able to get up off of floor on an independent basis in a safe fashion and with minimal fatigue and fall risk    Time 6   Period Weeks   Status New   Additional Long Term Goals   Additional Long Term Goals Yes   PT LONG TERM GOAL #6   Title Patient will be able ascend and descend ladders in a safe fashion, minimal unsteadiness, low fall risk    Time 6   Period Weeks   Status New   PT LONG TERM GOAL #7   Title  Patient will demonstrate improved aerobic capacity by demonstrating a toelrance of at least 8 minutes on elliptical machine    Time 6   Period Weeks   Status New               Plan - 05/19/15 1522    Clinical Impression Statement Completed quadriped activity without ball support today.  Focused on hip strengthening during this treatment with pt.  Pt needs cuing for proper breathiing and technique of exercises.    PT Next Visit Plan Work on strengthening gluteal mm to assist in transiition from floor to standing.         Problem List Patient Active Problem List   Diagnosis Date Noted  . Bilateral leg weakness 01/24/2015  . Elevated CPK 01/24/2015  . Transaminitis 10/24/2014   Rayetta Humphrey, PT CLT [phone] 05/19/2015, 3:30 PM  [place] [address] [place], [place], [address] Phone: [phone]   Fax:  [phone]

## 2015-05-19 NOTE — Therapy (Signed)
[place], [place], [address] Phone: [phone]   Fax:  [phone]  Occupational Therapy Reassessment & Treatment  Patient Details  Name: Jacob Hunter MRN: [id number] Date of Birth: 11-10-1955 Referring Provider:  Lawerance Bach, MD  Encounter Date: 05/19/2015      OT End of Session - 05/19/15 1609    Visit Number 10   Number of Visits 18   Date for OT Re-Evaluation 05/30/15   Authorization Type [employer]   OT Start Time 1347   OT Stop Time 1430   OT Time Calculation (min) 43 min   Activity Tolerance Patient tolerated treatment well   Behavior During Therapy [facility] for tasks assessed/performed      Past Medical History  Diagnosis Date  . Hypercholesteremia   . Knee pain   . Arthritis   . Polymyositis [facility])     Past Surgical History  Procedure Laterality Date  . Ankle surgery Left   . Knee surgery Left     There were no vitals filed for this visit.  Visit Diagnosis:  Muscle weakness of left arm  Muscle weakness of right arm  Decreased activities of daily living (ADL)  Stiffness of shoulder joint, left  Stiffness of shoulder joint, right      Subjective Assessment - 05/19/15 1355    Subjective  S: My wife has been taking me on little trips like the grocery store.    Currently in Pain? No/denies           [facility] OT Assessment - 05/19/15 1347    Assessment   Diagnosis Polymyositis-progressive UE weakness   Precautions   Precautions Fall   Precaution Comments Ok to complete weights. No heavier than 5#   AROM   Overall AROM Comments Assessed in seated, ER/IR adducted   AROM Assessment Site Shoulder   Right/Left Shoulder Left;Right   Right Shoulder Flexion 175 Degrees  previous 156 seated   Right Shoulder ABduction 150 Degrees  previous 121 seated   Right Shoulder Internal Rotation 90 Degrees  same as previous   Right Shoulder External Rotation 75 Degrees  previous 85 seated   Left Shoulder Flexion 170 Degrees  previous 147 seated   Left Shoulder ABduction 153 Degrees  previous 120 seated   Left Shoulder Internal Rotation 90 Degrees  same as previous   Left Shoulder External Rotation 72 Degrees  previous 74 seated   PROM   Overall PROM Comments WFL   PROM Assessment Site Shoulder   Right/Left Shoulder Right;Left   Strength   Right Shoulder Flexion 4+/5  previous 3/5   Right Shoulder ABduction 4/5  previous 3/4   Right Shoulder Internal Rotation 4-/5  previous 3/5   Right Shoulder External Rotation 4-/5  previous 3/5   Left Shoulder Flexion 4+/5  3/5 previous   Left Shoulder ABduction 4-/5  3/5 previous   Left Shoulder Internal Rotation 4-/5  3/5 previous   Left Shoulder External Rotation 4-/5  3/5 previous   Right/Left Elbow Left;Right   Right Elbow Flexion 4/5  4-/5 previous   Right Elbow Extension 4/5  4-/5 previous   Left Elbow Flexion 4/5  4-/5 previous   Left Elbow Extension 4/5  4-/5 previous                  OT Treatments/Exercises (OP) - 05/19/15 1355    Exercises   Exercises Shoulder;Work Hardening   Shoulder Exercises: Seated   Protraction Strengthening;10 reps  Protraction Weight (lbs) 1   Horizontal ABduction Strengthening;10 reps   Horizontal ABduction Weight (lbs) 1   External Rotation Strengthening;10 reps   External Rotation Weight (lbs) 1   Internal Rotation Strengthening;10 reps   Internal Rotation Weight (lbs) 1   Flexion Strengthening;10 reps   Flexion Weight (lbs) 1   Abduction Strengthening;10 reps   ABduction Weight (lbs) 1   Shoulder Exercises: ROM/Strengthening   UBE (Upper Arm Bike) Level 2 2' forward 2' reverse  cuing to remain between 7.0-8.0   Cybex Press 2 plate;20 reps   Cybex Row 2 plate;20 reps   X to V Arms 12X with1#   Ball on Wall 1' flexion 1' abduction BUE   Other ROM/Strengthening Exercises Total gym: row, extension, ER/IR at 7 degrees. Focus on form and technique, mod  difficulty   Neurological Re-education Exercises   Trunk Exercises Core Activation;Rotation   Trunk Rotation Seated russian twist with yellow weighted ball on edge of mat; 10X   Trunk Core Activation sit ups-slow up and down; 10X; able to complete 4 independently, 6 with rope                 OT Short Term Goals - 05/19/15 1610    OT SHORT TERM GOAL #1   Title Pt will be educated on HEP.    Time 3   Period Weeks   Status Achieved   OT SHORT TERM GOAL #2   Title Pt will decrease fascial restrictions from max to mod amounts.    Time 3   Period Weeks   Status Achieved   OT SHORT TERM GOAL #3   Title Pt will decrease pain to 2/10 during daily tasks.    Time 3   Period Weeks   Status Achieved   OT SHORT TERM GOAL #4   Title Pt will increase range of motion to [facility] to increase ability to donn shirts.    Time 3   Period Weeks   Status Achieved   OT SHORT TERM GOAL #5   Title Pt will increase strength to 4/5 to increase ability to lift milk out of refridgerator.    Time 3   Period Weeks   Status Partially Met           OT Long Term Goals - 05/19/15 1610    OT LONG TERM GOAL #1   Title Pt will return to prior level of independence and functioning during daily and work tasks.    Time 6   Period Weeks   Status On-going   OT LONG TERM GOAL #2   Title Pt will report no pain during daily tasks.    Time 6   Period Weeks   Status On-going   OT LONG TERM GOAL #3   Title Pt will increase AROM to WNL to increase ability to lift plates out of overhead cabinets.    Time 6   Period Weeks   Status On-going   OT LONG TERM GOAL #4   Title Pt will increase strength to 4+/5 to increase ability to complete lifting tasks at work.    Time 6   Period Weeks   Status On-going   OT LONG TERM GOAL #5   Title Pt will decrease fascial restrictions from min to trace amounts or less.    Time 6   Period Weeks   Status Achieved               Plan - 05/19/15 1609  Clinical  Impression Statement A: Mini reassessment completed this session, pt has met 4/5 STGs and 1/5 LTGs, and has partially met 1 additional STG. Pt is making great progress during therapy, recommend continuing an additional 4 weeks focusing on achieving full BUE ROM, increasing BUE strength, core strength, and endurance in order to return to prior level of functioning during daily tasks.  Added total gym this session, resumed BUE strengthening tasks. Pt tolerated well.    Plan P: Continue therapy 2x/week for 4 additional weeks focusing on increase BUE and core strengthening.         Problem List Patient Active Problem List   Diagnosis Date Noted  . Bilateral leg weakness 01/24/2015  . Elevated CPK 01/24/2015  . Transaminitis 10/24/2014    Guadelupe Sabin, OTR/L  [phone]  05/19/2015, 4:15 PM  [place] [address] [place], [place], [address] Phone: [phone]   Fax:  [phone]

## 2015-05-23 ENCOUNTER — Ambulatory Visit (HOSPITAL_COMMUNITY): Payer: Managed Care, Other (non HMO) | Admitting: Specialist

## 2015-05-23 ENCOUNTER — Ambulatory Visit (HOSPITAL_COMMUNITY): Payer: Managed Care, Other (non HMO) | Admitting: Physical Therapy

## 2015-05-23 DIAGNOSIS — M6281 Muscle weakness (generalized): Secondary | ICD-10-CM

## 2015-05-23 DIAGNOSIS — Z789 Other specified health status: Secondary | ICD-10-CM

## 2015-05-23 DIAGNOSIS — R2681 Unsteadiness on feet: Secondary | ICD-10-CM

## 2015-05-23 NOTE — Therapy (Signed)
[place], [place], [address] Phone: [phone]   Fax:  [phone]  Physical Therapy Treatment  Patient Details  Name: Jacob Hunter MRN: [id number] Date of Birth: 1956/01/04 Referring Provider:  Lawerance Bach, MD  Encounter Date: 05/23/2015      PT End of Session - 05/23/15 1558    Visit Number 12   Number of Visits 16   Date for PT Re-Evaluation 06/05/15   Authorization Type [employer]   Authorization Time Period 02/02/2015-04/04/15   Authorization - Visit Number 12   Authorization - Number of Visits 16   PT Start Time 2637   PT Stop Time 1557   PT Time Calculation (min) 40 min   Equipment Utilized During Treatment Gait belt   Activity Tolerance Patient tolerated treatment well   Behavior During Therapy [facility] for tasks assessed/performed      Past Medical History  Diagnosis Date  . Hypercholesteremia   . Knee pain   . Arthritis   . Polymyositis [facility])     Past Surgical History  Procedure Laterality Date  . Ankle surgery Left   . Knee surgery Left     There were no vitals filed for this visit.  Visit Diagnosis:  Muscle weakness of left arm  Unsteady gait  Muscle weakness of lower extremity  Decreased activities of daily living (ADL)      Subjective Assessment - 05/23/15 1517    Subjective Pt states he is sore but no pain    Currently in Pain? No/denies                         [facility] Adult PT Treatment/Exercise - 05/23/15 1518    Lumbar Exercises: Sidelying   Clam 10 reps   Hip Abduction 10 reps   Hip Abduction Weights (lbs) 3   Hip Abduction Limitations up to a count of 4 holding each count     Other Sidelying Lumbar Exercises leg circles forward and backward; x 10   Lumbar Exercises: Quadruped   Straight Leg Raise 10 reps   Straight Leg Raises Limitations on forearm 4 step    Opposite Arm/Leg Raise Right arm/Left leg;Left arm/Right leg;10 reps   Plank 10" x 3     Knee/Hip Exercises: Standing   SLS with Vectors 3x 5"   Other Standing Knee Exercises tandem stance with head turns x 2 (10 head turns each rep)   Knee/Hip Exercises: Seated   Sit to Sand 10 reps                  PT Short Term Goals - 05/08/15 1416    PT SHORT TERM GOAL #1   Title I in HEP   PT SHORT TERM GOAL #2   Title Pt to be able to come sit to stand with UE assist but needing no assist from others    Time 3   Period Weeks   Status Achieved   PT SHORT TERM GOAL #3   Title Pt to be able to stand for 5 minutes without use of UE for improved balance and decreased risk of falling    Time 3   Period Weeks   Status Achieved           PT Long Term Goals - 05/08/15 1420    PT LONG TERM GOAL #1   Title I in advance HEP   Time 6   Period Weeks  Status On-going   PT LONG TERM GOAL #2   Title Pt to be able to go up and down steps with UE assist    Time 6   Period Weeks   Status Achieved   PT LONG TERM GOAL #3   Title Pt strength to have increased by one grade to be able to ambulate with a hemiwalker/cane to allow one hand to be free to open doors.   Time 6   Period Weeks   Status Partially Met   PT LONG TERM GOAL #4   Title Pt to be able to pick an item off the floor   Time 6   Period Weeks   Status Achieved   PT LONG TERM GOAL #5   Title Patient will able to get up off of floor on an independent basis in a safe fashion and with minimal fatigue and fall risk    Time 6   Period Weeks   Status New   Additional Long Term Goals   Additional Long Term Goals Yes   PT LONG TERM GOAL #6   Title Patient will be able ascend and descend ladders in a safe fashion, minimal unsteadiness, low fall risk    Time 6   Period Weeks   Status New   PT LONG TERM GOAL #7   Title Patient will demonstrate improved aerobic capacity by demonstrating a toelrance of at least 8 minutes on elliptical machine    Time 6   Period Weeks   Status New               Plan -  05/23/15 1559    Clinical Impression Statement Allowed pt to complete quadriped activity on forearm due to UE fatigue from OT;  Added plank and tandem stance with head and vector stances to program.    PT Next Visit Plan Work on strengthening gluteal mm to assist in transiition from floor to standing.         Problem List Patient Active Problem List   Diagnosis Date Noted  . Bilateral leg weakness 01/24/2015  . Elevated CPK 01/24/2015  . Transaminitis 10/24/2014    Rayetta Humphrey, PT CLT [phone] 05/23/2015, 4:01 PM  [place] [address] [place], [place], [address] Phone: [phone]   Fax:  [phone]

## 2015-05-23 NOTE — Therapy (Signed)
[place], [place], [address] Phone: [phone]   Fax:  [phone]  Occupational Therapy Treatment  Patient Details  Name: Jacob Hunter MRN: [id number] Date of Birth: July 03, 1956 Referring Provider:  Lawerance Bach, MD  Encounter Date: 05/23/2015      OT End of Session - 05/23/15 2124    Visit Number 11   Number of Visits 18   Date for OT Re-Evaluation 05/30/15   Authorization Type [employer]   OT Start Time 1440   OT Stop Time 1515   OT Time Calculation (min) 35 min   Activity Tolerance Patient tolerated treatment well   Behavior During Therapy [facility] for tasks assessed/performed      Past Medical History  Diagnosis Date  . Hypercholesteremia   . Knee pain   . Arthritis   . Polymyositis [facility])     Past Surgical History  Procedure Laterality Date  . Ankle surgery Left   . Knee surgery Left     There were no vitals filed for this visit.  Visit Diagnosis:  Muscle weakness of right arm  Muscle weakness of left arm      Subjective Assessment - 05/23/15 1445    Subjective  S:  I am doing pretty well.  We are going to the [employer] this weekend.   Currently in Pain? No/denies            [facility] OT Assessment - 05/23/15 2118    Assessment   Diagnosis Polymyositis-progressive UE weakness   Precautions   Precautions Fall   Precaution Comments Ok to complete weights. No heavier than 5#                  OT Treatments/Exercises (OP) - 05/23/15 2118    Exercises   Exercises Shoulder;Work Hardening   Shoulder Exercises: Standing   Protraction Theraband;12 reps   Theraband Level (Shoulder Protraction) Level 3 (Green)   Horizontal ABduction Theraband;12 reps   Theraband Level (Shoulder Horizontal ABduction) Level 3 (Green)   Flexion Theraband;12 reps   Theraband Level (Shoulder Flexion) Level 3 (Green)   ABduction Theraband;12 reps   Theraband Level (Shoulder ABduction) Level 3  (Green)   Extension [employer] reps   Theraband Level (Shoulder Extension) Level 3 (Green)   Row [employer] reps   Theraband Level (Shoulder Row) Level 3 (Green)   Retraction Theraband;12 reps   Theraband Level (Shoulder Retraction) Level 3 (Green)   Other Standing Exercises lawnmower pull with green theraband 12 times   Other Standing Exercises inverted row 15 times with 2#   Shoulder Exercises: ROM/Strengthening   UBE (Upper Arm Bike) Level 3 3' forward and 3' reverse, keeping between speed of 7.0 and 8.0   Cybex Press 2.5 plate;15 reps   Cybex Row 2.5 plate;15 reps   Proximal Shoulder Strengthening, Seated 10 times with 2# with rest breaks after each exercises    Other ROM/Strengthening Exercises holding small therpay ball between both hands with elbows extended, completed 15 repetitions of the following:  chest press, overhead press, press right, center, left, drop to floor with squat to overhead, circle right, circle left.     Other ROM/Strengthening Exercises modfied push up postion on table with bosu ball in front of patient completed alternating walk ups and walk downs x 10                  OT Short Term Goals - 05/19/15 1610    OT SHORT  TERM GOAL #1   Title Pt will be educated on HEP.    Time 3   Period Weeks   Status Achieved   OT SHORT TERM GOAL #2   Title Pt will decrease fascial restrictions from max to mod amounts.    Time 3   Period Weeks   Status Achieved   OT SHORT TERM GOAL #3   Title Pt will decrease pain to 2/10 during daily tasks.    Time 3   Period Weeks   Status Achieved   OT SHORT TERM GOAL #4   Title Pt will increase range of motion to [facility] to increase ability to donn shirts.    Time 3   Period Weeks   Status Achieved   OT SHORT TERM GOAL #5   Title Pt will increase strength to 4/5 to increase ability to lift milk out of refridgerator.    Time 3   Period Weeks   Status Partially Met           OT Long Term Goals - 05/19/15 1610     OT LONG TERM GOAL #1   Title Pt will return to prior level of independence and functioning during daily and work tasks.    Time 6   Period Weeks   Status On-going   OT LONG TERM GOAL #2   Title Pt will report no pain during daily tasks.    Time 6   Period Weeks   Status On-going   OT LONG TERM GOAL #3   Title Pt will increase AROM to WNL to increase ability to lift plates out of overhead cabinets.    Time 6   Period Weeks   Status On-going   OT LONG TERM GOAL #4   Title Pt will increase strength to 4+/5 to increase ability to complete lifting tasks at work.    Time 6   Period Weeks   Status On-going   OT LONG TERM GOAL #5   Title Pt will decrease fascial restrictions from min to trace amounts or less.    Time 6   Period Weeks   Status Achieved               Plan - 05/23/15 2125    Clinical Impression Statement A:  Added therapy ball exercises this date which combined core and arm strengthening.  Exercises were a good challenge for patient.    Plan P:  Increase speed and incorporate plyometrics with therapy ball exercises.         Problem List Patient Active Problem List   Diagnosis Date Noted  . Bilateral leg weakness 01/24/2015  . Elevated CPK 01/24/2015  . Transaminitis 10/24/2014    Vangie Bicker, OTR/L [phone]  05/23/2015, 9:27 PM  [place] [address] [place], [place], [address] Phone: [phone]   Fax:  [phone]

## 2015-05-25 ENCOUNTER — Encounter (HOSPITAL_COMMUNITY): Payer: Managed Care, Other (non HMO)

## 2015-05-25 ENCOUNTER — Encounter (HOSPITAL_COMMUNITY): Payer: Managed Care, Other (non HMO) | Admitting: Physical Therapy

## 2015-05-30 ENCOUNTER — Ambulatory Visit (HOSPITAL_COMMUNITY): Payer: Managed Care, Other (non HMO)

## 2015-05-30 ENCOUNTER — Encounter (HOSPITAL_COMMUNITY): Payer: Self-pay

## 2015-05-30 ENCOUNTER — Ambulatory Visit (HOSPITAL_COMMUNITY): Payer: Managed Care, Other (non HMO) | Admitting: Physical Therapy

## 2015-05-30 DIAGNOSIS — Z789 Other specified health status: Secondary | ICD-10-CM

## 2015-05-30 DIAGNOSIS — R2681 Unsteadiness on feet: Secondary | ICD-10-CM

## 2015-05-30 DIAGNOSIS — M6281 Muscle weakness (generalized): Secondary | ICD-10-CM | POA: Diagnosis not present

## 2015-05-30 NOTE — Therapy (Signed)
[place] [address] [place], [place], [address] Phone: [phone]   Fax:  [phone]  Occupational Therapy Treatment  Patient Details  Name: Jacob Hunter MRN: [id number] Date of Birth: May 21, 1956 No Data Recorded  Encounter Date: 05/30/2015      OT End of Session - 05/30/15 1544    Visit Number 12   Number of Visits 18   Date for OT Re-Evaluation 05/30/15   Authorization Type [employer]   OT Start Time 1435   OT Stop Time 1515   OT Time Calculation (min) 40 min   Activity Tolerance Patient tolerated treatment well   Behavior During Therapy [facility] for tasks assessed/performed      Past Medical History  Diagnosis Date  . Hypercholesteremia   . Knee pain   . Arthritis   . Polymyositis [facility])     Past Surgical History  Procedure Laterality Date  . Ankle surgery Left   . Knee surgery Left     There were no vitals filed for this visit.  Visit Diagnosis:  Muscle weakness of left arm  Muscle weakness of right arm  Decreased activities of daily living (ADL)      Subjective Assessment - 05/30/15 1502    Subjective  S: I graduated from PT today.   Currently in Pain? No/denies                      OT Treatments/Exercises (OP) - 05/30/15 1505    Exercises   Exercises Shoulder;Work Hardening   Neurological Re-education Exercises   Trunk Exercises Core Activation;Other   Trunk Core Activation Patient completed a series of core strengthening exercises utilizing Bosu ball. All exercises completed for 10 repeitions.    Other Trunk Exercises 5 modified burpees and 10 modified squats using chair depth.                  OT Short Term Goals - 05/19/15 1610    OT SHORT TERM GOAL #1   Title Pt will be educated on HEP.    Time 3   Period Weeks   Status Achieved   OT SHORT TERM GOAL #2   Title Pt will decrease fascial restrictions from max to mod amounts.    Time 3   Period Weeks   Status  Achieved   OT SHORT TERM GOAL #3   Title Pt will decrease pain to 2/10 during daily tasks.    Time 3   Period Weeks   Status Achieved   OT SHORT TERM GOAL #4   Title Pt will increase range of motion to [facility] to increase ability to donn shirts.    Time 3   Period Weeks   Status Achieved   OT SHORT TERM GOAL #5   Title Pt will increase strength to 4/5 to increase ability to lift milk out of refridgerator.    Time 3   Period Weeks   Status Partially Met           OT Long Term Goals - 05/19/15 1610    OT LONG TERM GOAL #1   Title Pt will return to prior level of independence and functioning during daily and work tasks.    Time 6   Period Weeks   Status On-going   OT LONG TERM GOAL #2   Title Pt will report no pain during daily tasks.    Time 6   Period Weeks   Status On-going  OT LONG TERM GOAL #3   Title Pt will increase AROM to WNL to increase ability to lift plates out of overhead cabinets.    Time 6   Period Weeks   Status On-going   OT LONG TERM GOAL #4   Title Pt will increase strength to 4+/5 to increase ability to complete lifting tasks at work.    Time 6   Period Weeks   Status On-going   OT LONG TERM GOAL #5   Title Pt will decrease fascial restrictions from min to trace amounts or less.    Time 6   Period Weeks   Status Achieved               Plan - 05/30/15 1544    Clinical Impression Statement A: Added core strengthening with Bosu Ball. patient required verbal cues for form and technique. Pt did have increased difficulty due to fatigue and muscle weakness and required frequent rest breaks.    Plan P: Complete squats with plyo step and then incorporate weighted ball squats.        Problem List Patient Active Problem List   Diagnosis Date Noted  . Bilateral leg weakness 01/24/2015  . Elevated CPK 01/24/2015  . Transaminitis 10/24/2014    Ailene Ravel, OTR/L,CBIS  [phone]  05/30/2015, 3:49 PM  [place] [address] [place], [place], [address] Phone: [phone]   Fax:  [phone]  Name: Jacob Hunter MRN: [id number] Date of Birth: 04-10-1956

## 2015-05-30 NOTE — Therapy (Signed)
[place] [address] [place], [place], [address] Phone: [phone]   Fax:  [phone]  Physical Therapy Treatment  Patient Details  Name: Jacob Hunter MRN: [id number] Date of Birth: 12-31-1955 No Data Recorded  Encounter Date: 05/30/2015      PT End of Session - 05/30/15 1505    Visit Number 13   Number of Visits 16   Authorization Type [employer]   PT Start Time 6962   PT Stop Time 1429   PT Time Calculation (min) 44 min   Activity Tolerance Patient tolerated treatment well   Behavior During Therapy [facility] for tasks assessed/performed      Past Medical History  Diagnosis Date  . Hypercholesteremia   . Knee pain   . Arthritis   . Polymyositis [facility])     Past Surgical History  Procedure Laterality Date  . Ankle surgery Left   . Knee surgery Left     There were no vitals filed for this visit.  Visit Diagnosis:  Unsteady gait  Muscle weakness of lower extremity      Subjective Assessment - 05/30/15 1349    Subjective Pt reports that he did a lot of walking over the weekend, and he got fatigued after 2-3 hours with 2 seated rest breaks. He reports that he is no longer having any limitations at home anymore. He is able to sit on low surfaces and get up without a problem, he is able to walk for long periods of time, and he is now able to get on the floor and back up without difficulty.    How long can you sit comfortably? no limitations   How long can you stand comfortably? no limitations   How long can you walk comfortably? 2-3 hours with rest break   Currently in Pain? No/denies   Pain Score 0-No pain            [facility] PT Assessment - 05/30/15 0001    Observation/Other Assessments   Focus on Therapeutic Outcomes (FOTO)  83- 17% limited   Strength   Right Hip Flexion 4/5   Right Hip Extension 4/5   Right Hip ABduction 4/5   Left Hip Flexion 4/5   Left Hip Extension 4-/5   Left Hip ABduction 4/5   Right  Knee Flexion 5/5   Right Knee Extension 5/5   Left Knee Flexion 5/5   Left Knee Extension 5/5   Ambulation/Gait   Gait Comments [id number]'                     [facility] Adult PT Treatment/Exercise - 05/30/15 0001    Ambulation/Gait   Stairs Yes   Stairs Assistance 7: Independent   Stair Management Technique No rails;Alternating pattern   Therapeutic Activites    Therapeutic Activities Work [employer]   Work Geologist, engineering x 2RT, floor to stand transfers x 2                PT Education - 05/30/15 1446    Education provided Yes   Education Details Goals and HEP reviewed   Person(s) Educated Patient   Methods Explanation   Comprehension Verbalized understanding          PT Short Term Goals - 05/30/15 1414    PT SHORT TERM GOAL #1   Title I in HEP   Time 1   Period Weeks   Status Achieved   PT SHORT TERM GOAL #2  Title Pt to be able to come sit to stand with UE assist but needing no assist from others    Time 3   Period Weeks   Status Achieved   PT SHORT TERM GOAL #3   Title Pt to be able to stand for 5 minutes without use of UE for improved balance and decreased risk of falling    Time 3   Period Weeks   Status Achieved           PT Long Term Goals - 05/30/15 1414    PT LONG TERM GOAL #1   Title I in advance HEP   Baseline 45 minutes    Time 6   Period Weeks   Status Achieved   PT LONG TERM GOAL #2   Title Pt to be able to go up and down steps with UE assist    Time 6   Period Weeks   Status Achieved   PT LONG TERM GOAL #3   Title Pt strength to have increased by one grade to be able to ambulate with a hemiwalker/cane to allow one hand to be free to open doors.   Time 6   Period Weeks   Status Achieved   PT LONG TERM GOAL #4   Title Pt to be able to pick an item off the floor   Time 6   Period Weeks   Status Achieved   PT LONG TERM GOAL #5   Title Patient will able to get up off of floor on an independent basis in  a safe fashion and with minimal fatigue and fall risk    Time 6   Period Weeks   Status Achieved   PT LONG TERM GOAL #6   Title Patient will be able ascend and descend ladders in a safe fashion, minimal unsteadiness, low fall risk    Time 6   Period Weeks   Status Achieved   PT LONG TERM GOAL #7   Title Patient will demonstrate improved aerobic capacity by demonstrating a toelrance of at least 8 minutes on elliptical machine    Time 6   Period Weeks   Status Achieved               Plan - 05/30/15 1615    Clinical Impression Statement Reassessment was completed today after pt reported that he is no longer having difficulty with functional activities. Pt is now able to climb ladder without difficulty, can complete floor to stand transfer independently, and can complete sit to stand transfers from multiple surface heights without assistance. Pt has met his LTGs set for PT, and is being discharged to continue independently with HEP. Pt completed all work-related tasks without pain or assistance, and is appropriate to return to work following MD clearance.    PT Treatment/Interventions ADLs/Self Care Home Management;Gait training;Stair training;Functional mobility training;Therapeutic activities;Therapeutic exercise;Balance training;Neuromuscular re-education;Patient/family education        Problem List Patient Active Problem List   Diagnosis Date Noted  . Bilateral leg weakness 01/24/2015  . Elevated CPK 01/24/2015  . Transaminitis 10/24/2014    PHYSICAL THERAPY DISCHARGE SUMMARY  Visits from Start of Care: 13  Current functional level related to goals / functional outcomes: Pt is now able to complete sit to stand transfers independently, floor to stand transfers independently, demonstrates improved BLE strength, improved functional activity tolerance, increased gait speed, and no difficulty with navigating stairs.    Remaining deficits: None noted at this time.  Education / Equipment: HEP  Plan: Patient agrees to discharge.  Patient goals were met. Patient is being discharged due to meeting the stated rehab goals.  ?????      Hilma Favors, PT, DPT [phone] 05/30/2015, 4:20 PM  [place] [address] [place], [place], [address] Phone: [phone]   Fax:  [phone]  Name: SEYMORE BRODOWSKI MRN: [id number] Date of Birth: 16-May-1956

## 2015-06-01 ENCOUNTER — Encounter (HOSPITAL_COMMUNITY): Payer: Managed Care, Other (non HMO)

## 2015-06-01 ENCOUNTER — Encounter (HOSPITAL_COMMUNITY): Payer: Managed Care, Other (non HMO) | Admitting: Physical Therapy

## 2015-06-06 ENCOUNTER — Ambulatory Visit (HOSPITAL_COMMUNITY): Payer: Managed Care, Other (non HMO)

## 2015-06-06 ENCOUNTER — Encounter (HOSPITAL_COMMUNITY): Payer: Managed Care, Other (non HMO) | Admitting: Physical Therapy

## 2015-06-06 ENCOUNTER — Encounter (HOSPITAL_COMMUNITY): Payer: Self-pay

## 2015-06-06 DIAGNOSIS — M6281 Muscle weakness (generalized): Secondary | ICD-10-CM

## 2015-06-06 DIAGNOSIS — Z789 Other specified health status: Secondary | ICD-10-CM

## 2015-06-06 NOTE — Therapy (Signed)
[place], [place], [address] Phone: [phone]   Fax:  [phone]  Occupational Therapy Treatment  Patient Details  Name: Jacob Hunter MRN: [id number] Date of Birth: [birth year] Referring Provider: Schuyler Amor  Encounter Date: 06/06/2015      OT End of Session - 06/06/15 1504    Visit Number 13   Number of Visits 18   Date for OT Re-Evaluation 05/30/15   Authorization Type [employer]   OT Start Time 1345   OT Stop Time 1430   OT Time Calculation (min) 45 min   Activity Tolerance Patient tolerated treatment well   Behavior During Therapy [facility] for tasks assessed/performed      Past Medical History  Diagnosis Date  . Hypercholesteremia   . Knee pain   . Arthritis   . Polymyositis [facility])     Past Surgical History  Procedure Laterality Date  . Ankle surgery Left   . Knee surgery Left     There were no vitals filed for this visit.  Visit Diagnosis:  Muscle weakness of left arm  Muscle weakness of right arm  Decreased activities of daily living (ADL)      Subjective Assessment - 06/06/15 1350    Subjective  S: I'm going to leave here and mow the lawn at home. It'll take me 2 hours.   Currently in Pain? Yes   Pain Score 4    Pain Location Knee   Pain Orientation Left   Pain Descriptors / Indicators Nagging   Pain Type Acute pain            [facility] OT Assessment - 06/06/15 1402    Assessment   Diagnosis Polymyositis-progressive UE weakness   Referring Provider Schuyler Amor   Precautions   Precautions Fall   Precaution Comments Ok to complete weights. No heavier than 5#                  OT Treatments/Exercises (OP) - 06/06/15 1500    Exercises   Exercises Shoulder;Work Hardening   Neurological Re-education Exercises   Trunk Exercises Core Activation;Other   Trunk Core Activation Metabolic Conditioning Workout completed with focus on core strengthening, endurance, and UB  strength. First workout included: 10 minute time cap. 1) 10 second hold plank 2) 10 squats to 14.5" box 3) 10 push presses with weighted ball 4) 10 toe touch (each leg) to 11.5" box 5) 10 sit ups. Patient completed 49 reps total. Second workout included: 10 minute time cap. 1) 10 overhead press with weighted ball 2) 10 ball tosses with weighted ball 3) 10 chest presses with weighted ball. Completed 5 rounds total.                  OT Short Term Goals - 06/06/15 1549    OT SHORT TERM GOAL #1   Title Pt will be educated on HEP.    Time 3   Period Weeks   OT SHORT TERM GOAL #2   Title Pt will decrease fascial restrictions from max to mod amounts.    Time 3   Period Weeks   OT SHORT TERM GOAL #3   Title Pt will decrease pain to 2/10 during daily tasks.    Time 3   Period Weeks   OT SHORT TERM GOAL #4   Title Pt will increase range of motion to [facility] to increase ability to donn shirts.    Time 3   Period Weeks  OT SHORT TERM GOAL #5   Title Pt will increase strength to 4/5 to increase ability to lift milk out of refridgerator.    Time 3   Period Weeks   Status Partially Met           OT Long Term Goals - 06/06/15 1549    OT LONG TERM GOAL #1   Title Pt will return to prior level of independence and functioning during daily and work tasks.    Time 6   Period Weeks   Status On-going   OT LONG TERM GOAL #2   Title Pt will report no pain during daily tasks.    Time 6   Period Weeks   Status On-going   OT LONG TERM GOAL #3   Title Pt will increase AROM to WNL to increase ability to lift plates out of overhead cabinets.    Time 6   Period Weeks   Status On-going   OT LONG TERM GOAL #4   Title Pt will increase strength to 4+/5 to increase ability to complete lifting tasks at work.    Time 6   Period Weeks   Status On-going   OT LONG TERM GOAL #5   Title Pt will decrease fascial restrictions from min to trace amounts or less.    Time 6   Period Weeks                Plan - 06/06/15 1546    Clinical Impression Statement A: Pt completed a metabolic conditioning workout to focus on core strengthening, endurance, and overall UB strengthening. Patient had increased difficulty with lower body exercise due to muscle fatigue.    Plan P: Discharge from therapy next session with an HEP.        Problem List Patient Active Problem List   Diagnosis Date Noted  . Bilateral leg weakness 01/24/2015  . Elevated CPK 01/24/2015  . Transaminitis 10/24/2014    Ailene Ravel, OTR/L,CBIS  [phone]  06/06/2015, 4:13 PM  [place] [address] [place], [place], [address] Phone: [phone]   Fax:  [phone]  Name: Jacob Hunter MRN: [id number] Date of Birth: [birth year]

## 2015-06-08 ENCOUNTER — Encounter (HOSPITAL_COMMUNITY): Payer: Managed Care, Other (non HMO) | Admitting: Physical Therapy

## 2015-06-08 ENCOUNTER — Encounter (HOSPITAL_COMMUNITY): Payer: Managed Care, Other (non HMO)

## 2015-06-13 ENCOUNTER — Encounter (HOSPITAL_COMMUNITY): Payer: Self-pay

## 2015-06-13 ENCOUNTER — Encounter (HOSPITAL_COMMUNITY): Payer: Managed Care, Other (non HMO) | Admitting: Physical Therapy

## 2015-06-13 ENCOUNTER — Ambulatory Visit (HOSPITAL_COMMUNITY): Payer: Managed Care, Other (non HMO) | Attending: Internal Medicine

## 2015-06-13 DIAGNOSIS — M6281 Muscle weakness (generalized): Secondary | ICD-10-CM | POA: Diagnosis not present

## 2015-06-13 NOTE — Patient Instructions (Signed)
Strengthening: Chest Pull - Resisted   Hold Theraband in front of body with hands about shoulder width a part. Pull band a part and back together slowly. Repeat __15-20__ times. Complete __1__ set(s) per session.. Repeat __1__ session(s) per day.  http://orth.exer.us/926   Copyright  VHI. All rights reserved.   PNF Strengthening: Resisted   Standing with resistive band around each hand, bring right arm up and away, thumb back. Repeat _15-20___ times per set. Do _1___ sets per session. Do __1__ sessions per day.   Resisted External Rotation: in Neutral - Bilateral   Sit or stand, tubing in both hands, elbows at sides, bent to 90, forearms forward. Pinch shoulder blades together and rotate forearms out. Keep elbows at sides. Repeat __15-20__ times per set. Do 1__ sets per session. Do ___1_ sessions per day.  http://orth.exer.us/966   Copyright  VHI. All rights reserved.   PNF Strengthening: Resisted   Standing, hold resistive band above head. Bring right arm down and out from side. Repeat _15-20___ times per set. Do _1___ sets per session. Do __1__ sessions per day.  http://orth.exer.us/922   Copyright  VHI. All rights reserved.    (Home) Extension: Isometric / Bilateral Arm Retraction - Sitting   Facing anchor, hold hands and elbow at shoulder height, with elbow bent.  Pull arms back to squeeze shoulder blades together. Repeat 10-15 times.  Copyright  VHI. All rights reserved.   (Home) Retraction: Row - Bilateral (Anchor)   Facing anchor, arms reaching forward, pull hands toward stomach, keeping elbows bent and at your sides and pinching shoulder blades together. Repeat 10-15 times.  Copyright  VHI. All rights reserved.   (Clinic) Extension / Flexion (Assist)   Face anchor, pull arms back, keeping elbow straight, and squeze shoulder blades together. Repeat 10-15 times.   Copyright  VHI. All rights reserved.

## 2015-06-13 NOTE — Therapy (Signed)
[place], [place], [address] Phone: [phone]   Fax:  [phone]  Occupational Therapy Treatment  Patient Details  Name: Jacob Hunter MRN: [id number] Date of Birth: November 17, 1955 Referring Provider: Schuyler Amor  Encounter Date: 06/13/2015      OT End of Session - 06/13/15 1425    Visit Number 14   Number of Visits 18   Date for OT Re-Evaluation 05/30/15   Authorization Type [employer]   OT Start Time 1345   OT Stop Time 1430   OT Time Calculation (min) 45 min   Activity Tolerance Patient tolerated treatment well   Behavior During Therapy [facility] for tasks assessed/performed      Past Medical History  Diagnosis Date  . Hypercholesteremia   . Knee pain   . Arthritis   . Polymyositis [facility])     Past Surgical History  Procedure Laterality Date  . Ankle surgery Left   . Knee surgery Left     There were no vitals filed for this visit.  Visit Diagnosis:  Muscle weakness of left arm  Muscle weakness of right arm      Subjective Assessment - 06/13/15 1406    Subjective  S: I mowed half my lawn the other day. My legs were sore for 4 days after last session.    Currently in Pain? No/denies  Reports that bilateral quads have muscle soreness but aren't painful.            [facility] OT Assessment - 06/13/15 1350    Assessment   Diagnosis Polymyositis-progressive UE weakness   Precautions   Precautions Fall   Precaution Comments Ok to complete weights. No heavier than 5#   AROM   Overall AROM Comments Assessed in seated, er/IR adducted   AROM Assessment Site Shoulder   Right/Left Shoulder Right;Left   Right Shoulder Flexion 172 Degrees  previous: 175   Right Shoulder ABduction 160 Degrees  previous: 150   Right Shoulder Internal Rotation 90 Degrees  previous: 90   Right Shoulder External Rotation 90 Degrees  previous: 90   Left Shoulder Flexion 160 Degrees  previous: 170   Left Shoulder  ABduction 153 Degrees  previous: 153   Left Shoulder Internal Rotation 90 Degrees  previous: 90   Left Shoulder External Rotation 90 Degrees  previous: 72   Strength   Right/Left Shoulder Left;Right   Right Shoulder Flexion 5/5  previous: 4+/5   Right Shoulder ABduction 5/5  previous: 4/5   Right Shoulder Internal Rotation 5/5  previous: 4-/5   Right Shoulder External Rotation 4/5  previous: 4-/5   Left Shoulder Flexion 5/5  previous: 4+/5   Left Shoulder ABduction 4/5  previous: 4-/5   Left Shoulder Internal Rotation 4/5  previous: 4-/5   Left Shoulder External Rotation 5/5  previous: 4-/5   Right/Left Elbow Left;Right   Right Elbow Flexion 5/5  previous: 4/5   Right Elbow Extension 5/5  previous: 4/5   Left Elbow Flexion 5/5  previous: 4/5   Left Elbow Extension 5/5  previous: 4/5                  OT Treatments/Exercises (OP) - 06/13/15 1411    Exercises   Exercises Shoulder;Work Hardening   Shoulder Exercises: Standing   Horizontal ABduction Theraband;15 reps   Theraband Level (Shoulder Horizontal ABduction) Level 4 (Blue)   External Rotation Theraband;15 reps   Theraband Level (Shoulder External Rotation) Level 4 (Blue)  Internal Rotation Theraband;15 reps   Theraband Level (Shoulder Internal Rotation) Level 4 (Blue)   Flexion Theraband;15 reps   Theraband Level (Shoulder Flexion) Level 4 (Blue)   ABduction Theraband;15 reps   Theraband Level (Shoulder ABduction) Level 4 (Blue)   Extension Theraband;15 reps   Theraband Level (Shoulder Extension) Level 3 (Green)   Row Theraband;15 reps   Theraband Level (Shoulder Row) Level 3 (Green)   Retraction Theraband;15 reps   Theraband Level (Shoulder Retraction) Level 3 (Green)                  OT Short Term Goals - 06/13/15 1410    OT SHORT TERM GOAL #1   Title Pt will be educated on HEP.    Time 3   Period Weeks   OT SHORT TERM GOAL #2   Title Pt will decrease fascial restrictions from  max to mod amounts.    Time 3   Period Weeks   OT SHORT TERM GOAL #3   Title Pt will decrease pain to 2/10 during daily tasks.    Time 3   Period Weeks   OT SHORT TERM GOAL #4   Title Pt will increase range of motion to [facility] to increase ability to donn shirts.    Time 3   Period Weeks   OT SHORT TERM GOAL #5   Title Pt will increase strength to 4/5 to increase ability to lift milk out of refridgerator.    Time 3   Period Weeks   Status Achieved           OT Long Term Goals - 06/13/15 1410    OT LONG TERM GOAL #1   Title Pt will return to prior level of independence and functioning during daily and work tasks.    Time 6   Period Weeks   Status Achieved   OT LONG TERM GOAL #2   Title Pt will report no pain during daily tasks.    Time 6   Period Weeks   Status Achieved   OT LONG TERM GOAL #3   Title Pt will increase AROM to WNL to increase ability to lift plates out of overhead cabinets.    Time 6   Period Weeks   Status Achieved   OT LONG TERM GOAL #4   Title Pt will increase strength to 4+/5 to increase ability to complete lifting tasks at work.    Time 6   Period Weeks   Status Partially Met   OT LONG TERM GOAL #5   Title Pt will decrease fascial restrictions from min to trace amounts or less.    Time 6   Period Weeks               Plan - 06/13/15 1425    Clinical Impression Statement A: Reassessment and discharge completed this date. patient has met all short term goals and 4/5 LTGs and partially meeting 1 strength goal. Patient reports continued muscle fatigue with physical activity. Pt was given an updated HEP to complete at home. Patient also reports that he will be frequently going to the [facility] center with his daughter.   Plan P: Discharge from therapy with HEP.        Problem List Patient Active Problem List   Diagnosis Date Noted  . Bilateral leg weakness 01/24/2015  . Elevated CPK 01/24/2015  . Transaminitis 10/24/2014    OCCUPATIONAL THERAPY DISCHARGE SUMMARY  Visits from Start of Care: 14  Current functional  level related to goals / functional outcomes: See above   Remaining deficits: Muscle fatigue present during physical activity and exercise. Pt reports that he is slowly returning to all normal daily activities.    Education / Equipment: Energy conservation and UB strengthening with theraband.  Plan: Patient agrees to discharge.  Patient goals were met. Patient is being discharged due to meeting the stated rehab goals.  ?????       Ailene Ravel, OTR/L,CBIS  [phone]  06/13/2015, 2:28 PM  [place] [address] [place], [place], [address] Phone: [phone]   Fax:  [phone]  Name: Jacob Hunter MRN: [id number] Date of Birth: 1956-04-23

## 2015-06-16 DIAGNOSIS — G7289 Other specified myopathies: Secondary | ICD-10-CM | POA: Insufficient documentation

## 2015-10-14 ENCOUNTER — Emergency Department (HOSPITAL_COMMUNITY): Payer: 59

## 2015-10-14 ENCOUNTER — Emergency Department (HOSPITAL_COMMUNITY)
Admission: EM | Admit: 2015-10-14 | Discharge: 2015-10-14 | Disposition: A | Discharge status: Other Acute Inpatient Hospital | Payer: 59 | Attending: Emergency Medicine | Admitting: Emergency Medicine

## 2015-10-14 ENCOUNTER — Encounter (HOSPITAL_COMMUNITY): Payer: Self-pay | Admitting: Cardiology

## 2015-10-14 DIAGNOSIS — R531 Weakness: Secondary | ICD-10-CM | POA: Insufficient documentation

## 2015-10-14 DIAGNOSIS — M609 Myositis, unspecified: Secondary | ICD-10-CM | POA: Insufficient documentation

## 2015-10-14 DIAGNOSIS — E7800 Pure hypercholesterolemia, unspecified: Secondary | ICD-10-CM | POA: Insufficient documentation

## 2015-10-14 DIAGNOSIS — R1310 Dysphagia, unspecified: Secondary | ICD-10-CM | POA: Diagnosis present

## 2015-10-14 DIAGNOSIS — Z79899 Other long term (current) drug therapy: Secondary | ICD-10-CM | POA: Diagnosis not present

## 2015-10-14 HISTORY — DX: Other specified myopathies: G72.89

## 2015-10-14 LAB — CBC WITH DIFFERENTIAL/PLATELET
Basophils Absolute: 0 10*3/uL (ref 0.0–0.1)
Basophils Relative: 0 %
EOS ABS: 0.1 10*3/uL (ref 0.0–0.7)
EOS PCT: 1 %
HCT: 40.5 % (ref 39.0–52.0)
Hemoglobin: 13.6 g/dL (ref 13.0–17.0)
LYMPHS ABS: 0.2 10*3/uL — AB (ref 0.7–4.0)
Lymphocytes Relative: 3 %
MCH: 30.7 pg (ref 26.0–34.0)
MCHC: 33.6 g/dL (ref 30.0–36.0)
MCV: 91.4 fL (ref 78.0–100.0)
Monocytes Absolute: 0.3 10*3/uL (ref 0.1–1.0)
Monocytes Relative: 3 %
Neutro Abs: 7.9 10*3/uL — ABNORMAL HIGH (ref 1.7–7.7)
Neutrophils Relative %: 93 %
Platelets: 221 10*3/uL (ref 150–400)
RBC: 4.43 MIL/uL (ref 4.22–5.81)
RDW: 15.5 % (ref 11.5–15.5)
WBC: 8.5 10*3/uL (ref 4.0–10.5)

## 2015-10-14 LAB — I-STAT CHEM 8, ED
BUN: 20 mg/dL (ref 6–20)
CALCIUM ION: 1.15 mmol/L (ref 1.12–1.23)
CHLORIDE: 101 mmol/L (ref 101–111)
Creatinine, Ser: 0.8 mg/dL (ref 0.61–1.24)
Glucose, Bld: 95 mg/dL (ref 65–99)
HCT: 42 % (ref 39.0–52.0)
HEMOGLOBIN: 14.3 g/dL (ref 13.0–17.0)
Potassium: 4.5 mmol/L (ref 3.5–5.1)
SODIUM: 141 mmol/L (ref 135–145)
TCO2: 29 mmol/L (ref 0–100)

## 2015-10-14 LAB — COMPREHENSIVE METABOLIC PANEL
ALT: 251 U/L — ABNORMAL HIGH (ref 17–63)
ANION GAP: 12 (ref 5–15)
AST: 222 U/L — AB (ref 15–41)
Albumin: 3.9 g/dL (ref 3.5–5.0)
Alkaline Phosphatase: 52 U/L (ref 38–126)
BILIRUBIN TOTAL: 0.6 mg/dL (ref 0.3–1.2)
BUN: 19 mg/dL (ref 6–20)
CO2: 29 mmol/L (ref 22–32)
Calcium: 9.3 mg/dL (ref 8.9–10.3)
Chloride: 103 mmol/L (ref 101–111)
Creatinine, Ser: 0.7 mg/dL (ref 0.61–1.24)
GFR calc Af Amer: >60 mL/min (ref >60)
Glucose, Bld: 99 mg/dL (ref 65–99)
POTASSIUM: 4.6 mmol/L (ref 3.5–5.1)
Sodium: 144 mmol/L (ref 135–145)
TOTAL PROTEIN: 7.4 g/dL (ref 6.5–8.1)

## 2015-10-14 LAB — BRAIN NATRIURETIC PEPTIDE: B NATRIURETIC PEPTIDE 5: 36 pg/mL (ref 0.0–100.0)

## 2015-10-14 LAB — CK: CK TOTAL: 12761 U/L — AB (ref 49–397)

## 2015-10-14 MED ORDER — ACETAMINOPHEN 325 MG PO TABS
650.0000 mg | ORAL_TABLET | Freq: Once | ORAL | Status: AC
  Administered 2015-10-14: 650 mg via ORAL
  Filled 2015-10-14: qty 2

## 2015-10-14 MED ORDER — IOHEXOL 300 MG/ML  SOLN
75.0000 mL | Freq: Once | INTRAMUSCULAR | Status: AC | PRN
  Administered 2015-10-14: 75 mL via INTRAVENOUS

## 2015-10-14 NOTE — ED Notes (Signed)
MD at bedside. 

## 2015-10-14 NOTE — Discharge Instructions (Signed)
Take liquids only for the next couple days. Increase her Protonix to twice a day. Follow-up with her doctors the beginning of the week if he still have problems swallowing. If he gets worse over the weekend I would go to [facility]

## 2015-10-14 NOTE — ED Notes (Addendum)
Difficulty swallowing for 30-45 minutes.  States he feels swollen in his throat.  Able to drink water ok and denies any difficulties breathing.  Started chemotherapy for muscular disorder yesterday.

## 2015-10-14 NOTE — ED Provider Notes (Addendum)
CSN: [id number]     Arrival date & time 10/14/15  1308 History   First MD Initiated Contact with Patient 10/14/15 1332     Chief Complaint  Patient presents with  . Dysphagia     (Consider location/radiation/quality/duration/timing/severity/associated sxs/prior Treatment) Patient is a 60 y.o. male presenting with weakness. The history is provided by the patient (Patient states that today he's had problems swallowing foods but can swallow liquids and avoiding lies flat he gets short of breath. Like his throat is closing off).  Weakness This is a new problem. The current episode started 6 to 12 hours ago. The problem occurs constantly. The problem has not changed since onset.Pertinent negatives include no chest pain, no abdominal pain and no headaches. Nothing aggravates the symptoms. Nothing relieves the symptoms.    Past Medical History  Diagnosis Date  . Hypercholesteremia   . Knee pain   . Arthritis   . Polymyositis ([address])   . Necrotizing myopathy    Past Surgical History  Procedure Laterality Date  . Ankle surgery Left   . Knee surgery Left    Family History  Problem Relation Age of Onset  . Colon cancer Neg Hx   . Liver disease Neg Hx   . Hypertension Father   . Hypercholesterolemia Sister   . Hypercholesterolemia Father   . Hypertension Mother   . Breast cancer Sister    Social History  Substance Use Topics  . Smoking status: Never Smoker   . Smokeless tobacco: Never Used  . Alcohol Use: No     Comment: Last ETOH was 1-2 drinks in 1990.    Review of Systems  Constitutional: Negative for appetite change and fatigue.  HENT: Negative for congestion, ear discharge and sinus pressure.   Eyes: Negative for discharge.  Respiratory: Negative for cough.   Cardiovascular: Negative for chest pain.  Gastrointestinal: Negative for abdominal pain and diarrhea.  Genitourinary: Negative for frequency and hematuria.  Musculoskeletal: Negative for back pain.  Skin: Negative  for rash.  Neurological: Positive for weakness. Negative for seizures and headaches.  Psychiatric/Behavioral: Negative for hallucinations.      Allergies  Methylprednisolone; Other; and Statins  Home Medications   Prior to Admission medications   Medication Sig Start Date End Date Taking? Authorizing Provider  ascorbic acid (VITAMIN C) 500 MG tablet Take 500 mg by mouth daily.   Yes Historical Provider, MD  Calcium Carbonate-Vitamin D (CALCIUM-VITAMIN D) 500-200 MG-UNIT tablet Take 1 tablet by mouth daily. 03/22/15 05/01/16 Yes Historical Provider, MD  Coenzyme Q10 (CO Q 10) 10 MG CAPS Take 10 mg by mouth daily.   Yes Historical Provider, MD  FLUoxetine (PROZAC) 20 MG capsule Take 20 mg by mouth daily.   Yes Historical Provider, MD  folic acid (FOLVITE) 1 MG tablet Take 1 mg by mouth daily.   Yes Historical Provider, MD  methotrexate (RHEUMATREX) 2.5 MG tablet Take 25 mg by mouth once a week. Caution:Chemotherapy. Protect from light.(monday)   Yes Historical Provider, MD  pantoprazole (PROTONIX) 40 MG tablet Take 40 mg by mouth daily.   Yes Historical Provider, MD  predniSONE (DELTASONE) 20 MG tablet Take 20 mg by mouth daily with breakfast.   Yes Historical Provider, MD  senna (SENOKOT) 8.6 MG tablet Take 17.2 mg by mouth at bedtime. 03/22/15 05/01/16 Yes Historical Provider, MD  predniSONE (DELTASONE) 20 MG tablet Take 4 a day at one time Patient not taking: Reported on 10/14/2015 01/26/15   Asencion Noble, MD   BP 125/85  mmHg  Pulse 73  Temp(Src) 98.3 F (36.8 C) (Oral)  Resp 16  Ht '6\' 1"'$  (1.854 m)  Wt 221 lb (100.245 kg)  BMI 29.16 kg/m2  SpO2 97% Physical Exam  Constitutional: He is oriented to person, place, and time. He appears well-developed.  HENT:  Head: Normocephalic.  Eyes: Conjunctivae and EOM are normal. No scleral icterus.  Neck: Neck supple. No thyromegaly present.  Cardiovascular: Normal rate and regular rhythm.  Exam reveals no gallop and no friction rub.   No murmur  heard. Pulmonary/Chest: No stridor. He has no wheezes. He has no rales. He exhibits no tenderness.  Abdominal: He exhibits no distension. There is no tenderness. There is no rebound.  Musculoskeletal: He exhibits no edema.  Profound weakness in lower extremities  Lymphadenopathy:    He has no cervical adenopathy.  Neurological: He is oriented to person, place, and time. He exhibits normal muscle tone. Coordination normal.  Skin: No rash noted. No erythema.  Psychiatric: He has a normal mood and affect. His behavior is normal.    ED Course  Procedures (including critical care time) Labs Review Labs Reviewed  CBC WITH DIFFERENTIAL/PLATELET - Abnormal; Notable for the following:    Neutro Abs 7.9 (*)    Lymphs Abs 0.2 (*)    All other components within normal limits  COMPREHENSIVE METABOLIC PANEL - Abnormal; Notable for the following:    AST 222 (*)    ALT 251 (*)    All other components within normal limits  CK - Abnormal; Notable for the following:    Total CK 12761 (*)    All other components within normal limits  BRAIN NATRIURETIC PEPTIDE  I-STAT CHEM 8, ED    Imaging Review Dg Chest 2 View  10/14/2015  CLINICAL DATA:  Difficulty swallowing.  Chest pain. EXAM: CHEST  2 VIEW COMPARISON:  01/24/2015 FINDINGS: Minimal motion degraded lateral view. Midline trachea. Borderline cardiomegaly. Mediastinal contours otherwise within normal limits. No pleural effusion or pneumothorax. Clear lungs. IMPRESSION: No acute cardiopulmonary disease. Electronically Signed   By: Abigail Miyamoto M.D.   On: 10/14/2015 15:15   Ct Soft Tissue Neck W Contrast  10/14/2015  CLINICAL DATA:  Difficulty swallowing beginning 1 hour ago. Difficulty breathing. EXAM: CT NECK WITH CONTRAST TECHNIQUE: Multidetector CT imaging of the neck was performed using the standard protocol following the bolus administration of intravenous contrast. CONTRAST:  34m OMNIPAQUE IOHEXOL 300 MG/ML  SOLN COMPARISON:  None. FINDINGS:  Pharynx and larynx: No mucosal or submucosal abnormality. Glottic region appears normal. Salivary glands: Submandibular and parotid glands are normal. Thyroid: Normal Lymph nodes: No enlarged or low-density nodes. Vascular: Normal Limited intracranial: Normal Visualized orbits: Normal Mastoids and visualized paranasal sinuses: Mild mucosal thickening. No significant sinus disease. Skeleton: Normal Upper chest: Lung apices and superior mediastinal region are normal. No evidence of tracheal stenosis or airway compromise. IMPRESSION: Negative study. No abnormality seen to explain the presenting symptoms. No evidence of airway compromise or other acute process in the neck. Electronically Signed   By: MNelson ChimesM.D.   On: 10/14/2015 14:59   I have personally reviewed and evaluated these images and lab results as part of my medical decision-making.   EKG Interpretation None     Patient has a history of myopathy induced by statins he is being treated with prednisone and methotrexate and yesterday retrieved for treatment of Rituxan. He's complaining of the problem swallowing and having breathing problems when he lies down. Patient feels like his breath  is being cut off at top of his throat. CT scan the neck was unremarkable labs show a CK of 12,700. I spoke with neurologist at [facility] treatment of the myopathy with IV IgG MDM   Final diagnoses:  Difficulty swallowing  Myositis    The neurologist at [facility] I spoke with today with Dr.  Concha Pyo.  He is the accepting md    Milton Ferguson, MD 10/14/15 1722  Milton Ferguson, MD 10/14/15 [phone]

## 2015-10-14 NOTE — ED Notes (Signed)
Pt c/o minor headache but doesn't want any medications at this time.

## 2015-10-14 NOTE — ED Notes (Signed)
Patient discharged from ED transferring to [facility]

## 2015-11-10 ENCOUNTER — Encounter (HOSPITAL_COMMUNITY): Payer: Self-pay

## 2015-12-19 ENCOUNTER — Emergency Department (HOSPITAL_COMMUNITY)
Admission: EM | Admit: 2015-12-19 | Discharge: 2015-12-19 | Disposition: A | Discharge status: Other Acute Inpatient Hospital | Payer: 59 | Attending: Emergency Medicine | Admitting: Emergency Medicine

## 2015-12-19 ENCOUNTER — Encounter (HOSPITAL_COMMUNITY): Payer: Self-pay

## 2015-12-19 DIAGNOSIS — Z79899 Other long term (current) drug therapy: Secondary | ICD-10-CM | POA: Insufficient documentation

## 2015-12-19 DIAGNOSIS — M609 Myositis, unspecified: Secondary | ICD-10-CM | POA: Diagnosis not present

## 2015-12-19 DIAGNOSIS — R531 Weakness: Secondary | ICD-10-CM | POA: Diagnosis present

## 2015-12-19 NOTE — ED Notes (Signed)
Pt reports has a condition called necrotizing myopathy and reports worsening generalized weakness since Sunday.  Denies pain.

## 2015-12-19 NOTE — ED Provider Notes (Signed)
CSN: [id number]     Arrival date & time 12/19/15  1503 History   First MD Initiated Contact with Patient 12/19/15 1525     Chief Complaint  Patient presents with  . Weakness     (Consider location/radiation/quality/duration/timing/severity/associated sxs/prior Treatment) HPI   59yM with progressive weakness. He has a history of autoimmune necrotizing myopathy. This is not a condition I'm familiar with. He reports statin induced and has been progressive despite cessation. Managed by neurology at [facility] with ongoing immunotherapy. Since Sunday he has been getting weaker to the point that he is unable to get up from a chair. Increasing shakiness. Weakness has been more pronounced proximally than distally. No difficulty with swallowing. No respiratory complaints.   Past Medical History  Diagnosis Date  . Hypercholesteremia   . Knee pain   . Arthritis   . Polymyositis ([place])   . Necrotizing myopathy    Past Surgical History  Procedure Laterality Date  . Ankle surgery Left   . Knee surgery Left    Family History  Problem Relation Age of Onset  . Colon cancer Neg Hx   . Liver disease Neg Hx   . Hypertension Father   . Hypercholesterolemia Sister   . Hypercholesterolemia Father   . Hypertension Mother   . Breast cancer Sister    Social History  Substance Use Topics  . Smoking status: Never Smoker   . Smokeless tobacco: Never Used  . Alcohol Use: No     Comment: Last ETOH was 1-2 drinks in 1990.    Review of Systems  All systems reviewed and negative, other than as noted in HPI.   Allergies  Methylprednisolone; Other; and Statins  Home Medications   Prior to Admission medications   Medication Sig Start Date End Date Taking? Authorizing Provider  ascorbic acid (VITAMIN C) 500 MG tablet Take 500 mg by mouth daily.    Historical Provider, MD  Calcium Carbonate-Vitamin D (CALCIUM-VITAMIN D) 500-200 MG-UNIT tablet Take 1 tablet by mouth daily. 03/22/15 05/01/16  Historical  Provider, MD  Coenzyme Q10 (CO Q 10) 10 MG CAPS Take 10 mg by mouth daily.    Historical Provider, MD  FLUoxetine (PROZAC) 20 MG capsule Take 20 mg by mouth daily.    Historical Provider, MD  folic acid (FOLVITE) 1 MG tablet Take 1 mg by mouth daily.    Historical Provider, MD  methotrexate (RHEUMATREX) 2.5 MG tablet Take 25 mg by mouth once a week. Caution:Chemotherapy. Protect from light.(monday)    Historical Provider, MD  pantoprazole (PROTONIX) 40 MG tablet Take 40 mg by mouth daily.    Historical Provider, MD  predniSONE (DELTASONE) 20 MG tablet Take 4 a day at one time Patient not taking: Reported on 10/14/2015 01/26/15   Asencion Noble, MD  predniSONE (DELTASONE) 20 MG tablet Take 20 mg by mouth daily with breakfast.    Historical Provider, MD  senna (SENOKOT) 8.6 MG tablet Take 17.2 mg by mouth at bedtime. 03/22/15 05/01/16  Historical Provider, MD   BP 133/84 mmHg  Pulse 80  Temp(Src) 97.8 F (36.6 C) (Oral)  Resp 15  Ht '6\' 1"'$  (1.854 m)  Wt 227 lb (102.967 kg)  BMI 29.96 kg/m2  SpO2 94% Physical Exam  Constitutional: He is oriented to person, place, and time. He appears well-developed and well-nourished. No distress.  Laying in bed. NAD.   HENT:  Head: Normocephalic and atraumatic.  Eyes: Conjunctivae are normal. Right eye exhibits no discharge. Left eye exhibits no discharge.  Neck: Neck supple.  Cardiovascular: Normal rate, regular rhythm and normal heart sounds.  Exam reveals no gallop and no friction rub.   No murmur heard. Pulmonary/Chest: Effort normal and breath sounds normal. No respiratory distress.  Abdominal: Soft. He exhibits no distension. There is no tenderness.  Musculoskeletal: He exhibits no edema or tenderness.  Neurological: He is alert and oriented to person, place, and time. No cranial nerve deficit.  B/l deltoids 2/5. Biceps 4/5. Triceps 4/5. Hip flexors 2/5. Knee flexion 4/5. Knee extension 4/5. Sensation intact to light touch. Biceps and patellar reflexes 2+  b/l.   Skin: Skin is warm and dry.  Psychiatric: He has a normal mood and affect. His behavior is normal. Thought content normal.  Nursing note and vitals reviewed.   ED Course  Procedures (including critical care time) Labs Review Labs Reviewed - No data to display  Imaging Review No results found. I have personally reviewed and evaluated these images and lab results as part of my medical decision-making.   EKG Interpretation   Date/Time:  Tuesday Dec 19 2015 15:09:38 EDT Ventricular Rate:  81 PR Interval:  142 QRS Duration: 102 QT Interval:  384 QTC Calculation: 446 R Axis:   63 Text Interpretation:  Sinus rhythm No significant change since last  tracing Confirmed by [place] ([id number]) on 12/19/2015 4:11:33 PM      MDM   Final diagnoses:  Myositis    59yM with progressive weakness. Hx of autoimmune necrotizing myopathy. Management of this is beyond my scope of knowledge. From patient's description, it sounds like plan was for more IVIG.  Followed by neurology at [facility]. Will discuss with neurology for their recs.   Discussed with Dr Shelia Media, neurology. Transfer. No testing prior to transfer. Pt/sister-in-law updated.   Virgel Manifold, MD 12/21/15 1314

## 2015-12-19 NOTE — ED Notes (Signed)
EDP at bedside for evaluation.

## 2015-12-30 DIAGNOSIS — F419 Anxiety disorder, unspecified: Secondary | ICD-10-CM

## 2015-12-30 DIAGNOSIS — F329 Major depressive disorder, single episode, unspecified: Secondary | ICD-10-CM | POA: Insufficient documentation

## 2015-12-30 DIAGNOSIS — Z7409 Other reduced mobility: Secondary | ICD-10-CM | POA: Insufficient documentation

## 2015-12-30 DIAGNOSIS — Z789 Other specified health status: Secondary | ICD-10-CM | POA: Insufficient documentation

## 2016-03-01 ENCOUNTER — Telehealth (HOSPITAL_COMMUNITY): Payer: Self-pay

## 2016-03-01 ENCOUNTER — Telehealth (HOSPITAL_COMMUNITY): Payer: Self-pay | Admitting: Physical Therapy

## 2016-03-01 NOTE — Telephone Encounter (Signed)
Patient called to say that the MD needs to re-evaluate him before he schedules these apptments. We will contact pt in a week if we have not heard from him.

## 2016-03-04 ENCOUNTER — Ambulatory Visit (HOSPITAL_COMMUNITY): Payer: 59

## 2016-03-04 ENCOUNTER — Ambulatory Visit (HOSPITAL_COMMUNITY): Payer: 59 | Admitting: Physical Therapy

## 2016-03-18 ENCOUNTER — Encounter (HOSPITAL_COMMUNITY): Payer: Self-pay

## 2016-03-18 ENCOUNTER — Ambulatory Visit (HOSPITAL_COMMUNITY): Payer: 59 | Attending: Internal Medicine

## 2016-03-18 ENCOUNTER — Ambulatory Visit (HOSPITAL_COMMUNITY): Payer: 59 | Admitting: Physical Therapy

## 2016-03-18 DIAGNOSIS — Z789 Other specified health status: Secondary | ICD-10-CM | POA: Diagnosis present

## 2016-03-18 DIAGNOSIS — R29898 Other symptoms and signs involving the musculoskeletal system: Secondary | ICD-10-CM | POA: Insufficient documentation

## 2016-03-18 DIAGNOSIS — R262 Difficulty in walking, not elsewhere classified: Secondary | ICD-10-CM | POA: Insufficient documentation

## 2016-03-18 DIAGNOSIS — M6281 Muscle weakness (generalized): Secondary | ICD-10-CM | POA: Diagnosis present

## 2016-03-18 DIAGNOSIS — R2681 Unsteadiness on feet: Secondary | ICD-10-CM | POA: Insufficient documentation

## 2016-03-18 NOTE — Therapy (Signed)
[place], [place], [address] Phone: [phone]   Fax:  [phone]  Occupational Therapy Evaluation  Patient Details  Name: Jacob Hunter MRN: [id number] Date of Birth: 22-Sep-1955 Referring Provider: Dr. Asencion Noble  Encounter Date: 03/18/2016      OT End of Session - 03/18/16 1536    Visit Number 1   Number of Visits 16   Date for OT Re-Evaluation 04/29/16  mini reassess: 04/15/16   Authorization Type [employer]   Authorization Time Period Visit limit 60 - 0 used   Authorization - Visit Number 1   Authorization - Number of Visits 62   OT Start Time 0932   OT Stop Time 1515   OT Time Calculation (min) 40 min   Activity Tolerance Patient tolerated treatment well   Behavior During Therapy [facility] for tasks assessed/performed      Past Medical History:  Diagnosis Date  . Arthritis   . Hypercholesteremia   . Knee pain   . Necrotizing myopathy   . Polymyositis [facility])     Past Surgical History:  Procedure Laterality Date  . ANKLE SURGERY Left   . KNEE SURGERY Left     There were no vitals filed for this visit.      Subjective Assessment - 03/18/16 1530    Subjective  S: This time around it is a lot worse.    Pertinent History Patient is a 60 y/o male who is well known to the clinic and is returning for decreased strength and endurance resulting from a nueromuscular disorder of necrotizing myopathy. patient reports that after he finished OP therapy here last year he returned to work November 2016 and worked until the end of january 2017. Patient then had a flare up of disorder. Pt was admited to [facility] for 1 month, then received therapy at the [facility] in [place] for 1 month, and then was discharged home where he received [facility] PT for 6 weeks. Dr. Abbott Pao reports that his enzymes are current 3500 and they should be at 100. Dr. Asencion Noble has referred patient to occupational therapy for  evaluation and treatment.    Repetition Increases Symptoms   Patient Stated Goals To walk, use stairs, increase strength in upper and lower body, and be able to squat.    Currently in Pain? No/denies           [facility] OT Assessment - 03/18/16 1539      Assessment   Diagnosis BUE weakness   Referring Provider Dr. Asencion Noble   Onset Date --  Nov. 2015   Prior Therapy Pt received OP OT for current condition 03/31/15-06/13/15.     Precautions   Precautions Other (comment)   Precaution Comments CAN ONLY GO TO 50-60% OF MAX RIGHT NOW      Restrictions   Weight Bearing Restrictions No     Balance Screen   Has the patient fallen in the past 6 months No     Home  Environment   Family/patient expects to be discharged to: Private residence   Living Arrangements Spouse/significant other   Available Help at Discharge Family     Prior Function   Level of [place];Independent with basic ADLs;Independent with gait;Independent with transfers   Vocation Full time employment  short term disability   Vocation Requirements walking, movement, runs two machines, bending, mild repetitive lifting    Leisure hunting, fishing  ADL   ADL comments Patient reports that he is able to complete all necessary daily tasks although he becomes fatigued after only 7-8 minutes of activity. Pt reports decreased strength and endurance for all tasks.      Mobility   Mobility Status Needs assist  Uses RW and manual wheelchair     Written Expression   Dominant Hand Right     Vision - History   Baseline Vision Wears glasses all the time     Cognition   Overall Cognitive Status Within Functional Limits for tasks assessed     Coordination   9 Hole Peg Test Left;Right   Right 9 Hole Peg Test 19.7"   Left 9 Hole Peg Test 17.8"     ROM / Strength   AROM / PROM / Strength Strength;PROM;AROM     AROM   Overall AROM  Within functional limits for tasks performed   Overall AROM Comments BUE      Strength   Strength Assessment Site Shoulder;Elbow;Hand   Right/Left Shoulder Left;Right   Right Shoulder Flexion 3/5   Right Shoulder ABduction 3/5   Right Shoulder Internal Rotation 3/5   Right Shoulder External Rotation 3/5   Right Shoulder Horizontal ABduction 3/5   Right Shoulder Horizontal ADduction 3/5   Left Shoulder Flexion 3/5   Left Shoulder ABduction 3/5   Left Shoulder Internal Rotation 3/5   Left Shoulder External Rotation 3/5   Left Shoulder Horizontal ABduction 3/5   Left Shoulder Horizontal ADduction 3/5   Right/Left Elbow Left;Right   Right Elbow Flexion 4-/5   Right Elbow Extension 5/5   Left Elbow Flexion 3/5   Left Elbow Extension 3/5   Right/Left hand Left;Right   Right Hand Grip (lbs) 75   Right Hand Lateral Pinch 0 lbs   Right Hand 3 Point Pinch 26 lbs   Left Hand Grip (lbs) 80   Left Hand Lateral Pinch 18 lbs   Left Hand 3 Point Pinch 22 lbs                         OT Education - 03/18/16 1535    Education provided Yes   Education Details Patient was encouraged to use red theraputty for grip strength and complete A/ROM shoulder exercises that he was completing prior in therapy.   Person(s) Educated Patient   Methods Explanation   Comprehension Verbalized understanding          OT Short Term Goals - 03/18/16 1548      OT SHORT TERM GOAL #1   Title Patient will be educated and independent with HEP to increase functional performance during daily tasks.    Time 4   Period Weeks   Status New     OT SHORT TERM GOAL #2   Title Patient will increase BUE shoulder and elbow strength to 4/5 to increase ability to complete lightweight lifting activities.   Time 4   Period Weeks   Status New     OT SHORT TERM GOAL #3   Title Patient will increase overal activity tolerance by completing a simple household task for 10-15 minutes without increased fatigue.    Time 4   Period Weeks   Status New     OT SHORT TERM GOAL #4    Title Patient will increase overall core strength to increase ability to complete sit to stands with less difficulty.     Time 4   Period Weeks  Status New           OT Long Term Goals - 03/18/16 1550      OT LONG TERM GOAL #1   Title Pt will return to prior level of independence and functioning during daily and work tasks.    Time 8   Period Weeks   Status New     OT LONG TERM GOAL #2   Title Patient will increase BUE shoulder and elbow strength to 4+/5 or greater to increase ability to complete work related tasks.   Time 8   Period Weeks   Status New     OT LONG TERM GOAL #3   Title Patient will increase overal activity tolerance by completing a simple household task for 30-35 minutes without increased fatigue.    Time 8   Period Weeks   Status New     OT LONG TERM GOAL #4   Title Patient will increase BUE grip strength to within average norms for age in order to be able to open tight lids or containers with less difficulty.    Time 8   Period Weeks   Status New               Plan - 03/18/16 1538    Clinical Impression Statement A: patient is a 60 y/o male S/P necrotizing myopathy causing decreased strength and endurance which results in difficulty completing all basic ADL tasks.    Rehab Potential Excellent   OT Frequency 2x / week   OT Duration 8 weeks   OT Treatment/Interventions Self-care/ADL training;Electrical Stimulation;Therapeutic activities;DME and/or AE instruction;Energy conservation;Therapeutic exercises;Neuromuscular education;Patient/family education   Plan P: Patient will benefit from skilled OT services to increase functional performance during daily tasks. Treatment plan: grip strengthening, BUE strength and endurance exercises. energy conservation techniques, remain within 50-60% exertion level.   Consulted and Agree with Plan of Care Patient      Patient will benefit from skilled therapeutic intervention in order to improve the following  deficits and impairments:  Decreased endurance, Decreased activity tolerance, Decreased strength  Visit Diagnosis: Other symptoms and signs involving the musculoskeletal system - Plan: Ot plan of care cert/re-cert    Problem List Patient Active Problem List   Diagnosis Date Noted  . Bilateral leg weakness 01/24/2015  . Elevated CPK 01/24/2015  . Transaminitis 10/24/2014   Ailene Ravel, OTR/L,CBIS  [phone]  03/18/2016, 3:55 PM  [place] [address] [place], [place], [address] Phone: [phone]   Fax:  [phone]  Name: Jacob Hunter MRN: [id number] Date of Birth: [birth year]

## 2016-03-18 NOTE — Therapy (Signed)
[place], [place], [address] Phone: [phone]   Fax:  [phone]  Physical Therapy Evaluation  Patient Details  Name: Jacob Hunter MRN: [id number] Date of Birth: 06/11/1956 Referring Provider: Asencion Noble   Encounter Date: 03/18/2016      PT End of Session - 03/18/16 1525    Visit Number 1   Number of Visits 16   Date for PT Re-Evaluation 04/15/16   Authorization Type [employer] (60 visits between PT/OT/speech)   Authorization Time Period 03/18/16 to 05/18/16   PT Start Time 1348   PT Stop Time 1420   PT Time Calculation (min) 32 min   Activity Tolerance Patient tolerated treatment well;Patient limited by fatigue   Behavior During Therapy [facility] for tasks assessed/performed      Past Medical History:  Diagnosis Date  . Arthritis   . Hypercholesteremia   . Knee pain   . Necrotizing myopathy   . Polymyositis [facility])     Past Surgical History:  Procedure Laterality Date  . ANKLE SURGERY Left   . KNEE SURGERY Left     There were no vitals filed for this visit.       Subjective Assessment - 03/18/16 1350    Subjective Patient reports that he had been previously ambulatory; he was here last August and was doing quite well. He went back to work November 2016, worked through to January when he had a flare-up where his enzyme number shot up around 12,000. [facility], inpatient PT for a month and a half, then HHPT who recommended for him to come to OP PT. He is able to walk with a walker but not very far, maybe 277f. MD says not to push him past 50-60% right now. He is using the sliding board for transfers.    Pertinent History history of L knee arthroscopic surgery, history of L ankle surgery, no other signifcant PMH    How long can you stand comfortably? unlimited as long as he is not exercising    How long can you walk comfortably? 2071fwith walker    Patient  Stated Goals improve mobilty, get stronger    Currently in Pain? No/denies            [facility] - 03/18/16 0001      Assessment   Medical Diagnosis neuromuscular disorder    Referring Provider RoAsencion Noble  Onset Date/Surgical Date --  flare up January 2017   Next MD Visit Dr. FaWilley Bladeo follow up, Dr. CaTillman Abiden December      Precautions   Precaution Comments CAN ONLY GO TO 50-60% OF MAX RIGHT NOW      Balance Screen   Has the patient fallen in the past 6 months No   Has the patient had a decrease in activity level because of a fear of falling?  Yes   Is the patient reluctant to leave their home because of a fear of falling?  Yes     Prior Function   Level of Independence Independent;Independent with basic ADLs;Independent with gait;Independent with transfers   Vocation On disability;Other (comment);Full time employment  short term disability   Vocation Requirements on short term disability now, usually has 10 hour shifts at work     Observation/Other Assessments   Observations very easily fatigued      Strength   Right Hip Flexion 2/5   Right  Hip Extension 2/5   Right Hip Internal Rotation 4/5   Right Hip ABduction 2/5   Left Hip Flexion 2/5   Left Hip Extension 2/5   Left Hip ABduction 2/5   Right Knee Flexion 4-/5   Right Knee Extension 4+/5   Left Knee Flexion 4-/5   Left Knee Extension 4/5   Right Ankle Dorsiflexion 4+/5   Left Ankle Dorsiflexion 4/5     Bed Mobility   Rolling Right 6: Modified independent (Device/Increase time)   Rolling Left 6: Modified independent (Device/Increase time)   Supine to Sit 3: Mod assist  poor mechanics    Sit to Supine 4: Min guard     Transfers   Transfers Lateral/Scoot Transfers;Sit to Stand;Stand to Sit   Sit to Stand 4: Min guard   Stand to Sit 4: Min guard   Lateral/Scoot Transfers 6: Modified independent (Device/Increase time)     Ambulation/Gait   Gait Pattern Step-through pattern;Decreased  dorsiflexion - left;Decreased weight shift to left;Decreased trunk rotation;Trunk flexed   Gait Comments 10f, 144fwith walker   gait mechanics worsen with fatigue      High Level Balance   High Level Balance Comments unsupported standing x60 seconds (stopped by PT); tandem stance max 3 seconds each LE; semi-supported SLS (intermittent toe touch/UE support) 7 second max R, 1 second max L LE)                           PT Education - 03/18/16 1525    Education provided Yes   Education Details prognosis, POC, continue with prescribed HEP from HHPT/inpatient    Person(s) Educated Patient   Methods Explanation   Comprehension Verbalized understanding          PT Short Term Goals - 03/18/16 1533      PT SHORT TERM GOAL #1   Title Patient to be independent in all bed mobility in order to improve function and QOL at home    Time 4   Period Weeks   Status New     PT SHORT TERM GOAL #2   Title Patient to be able to tolerate at least 5 minutes of unsupported standing with RPE no more than 5/10 in order to demonstrate improved functional activity tolerance and balance    Time 4   Period Weeks   Status New     PT SHORT TERM GOAL #3   Title Patient to be able to ambulate at least 40062fith LRAD and Mod(I), RPE no more than 5/10 in order to demonstrate improved mobility and QOL    Time 4   Period Weeks   Status New     PT SHORT TERM GOAL #4   Title Patient to be able to perform stand-pivot transfers with LRAD on a Mod(I) basis in order to improve QOL and function at home    Time 4   Period Weeks   Status New     PT SHORT TERM GOAL #5   Title Patient to be indepedent in correctly and consistently performing appropriate HEP, to be updated PRN   Time 4   Period Weeks   Status New           PT Long Term Goals - 03/18/16 1537      PT LONG TERM GOAL #1   Title Patient to demonstrate proximal muscle strength at least 4/5 in order to assist in improving  balance and tolerance to closed chain  activities such as gait    Time 8   Period Weeks   Status New     PT LONG TERM GOAL #2   Title Patient to be able to tolerate 15 minutes of unsupported standing in order to demonstrate improved functional activity tolernace and strength, assist in self care based activities    Time 8   Period Weeks   Status New     PT LONG TERM GOAL #3   Title Patient to be able to ambulate at least 1023f with LRAD, RPE no more than 5/10, with Mod(I) in order to improve general mobility and allow patient to access community    Time 8   Period Weeks   Status New     PT LONG TERM GOAL #4   Title Patient to be able to hold unsupported tandem stance at least 60 seconds B and SLS at least 30 seconds B in order to demonstrate improved balance and reduced fall risk    Time 8   Period Weeks   Status New     PT LONG TERM GOAL #5   Title Patient to be educated on benefits of and participatory in water exercise program at least 2 times per week in order to maintain functional gains and promote regular activity within safe and well tolerated environment   Time 8   Period Weeks   Status New               Plan - 03/18/16 1527    Clinical Impression Statement Patient arrives after experiencing a flare up of his necrotizing myopathy in January of this year, which he reports has really taken him out of action as he was hospitalized for some time and has received extensive therapy in inpatient and HHPT settings thus far. Patient reports that at this time his MD only wants him pushing to 50-60% of maximal exertion, and they are going to re-test his enzymes in December. Upon examination, patient reveals poor functional activity tolerance, impaired functional strength, gait and postural deviations that worsen as fatigue increases, unsteadiness, and reduced functional task performance skills. Patient will benefit from skilled PT services in order to address functional  limitations and assist in reaching optimal level of function with minimal fall risk.    Rehab Potential Good   Clinical Impairments Affecting Rehab Potential chronic neuromuscular disease    PT Frequency 2x / week   PT Duration 8 weeks   PT Treatment/Interventions ADLs/Self Care Home Management;Biofeedback;Cryotherapy;Moist Heat;DME Instruction;Gait training;Stair training;Functional mobility training;Therapeutic activities;Therapeutic exercise;Balance training;Neuromuscular re-education;Patient/family education;Manual techniques;Energy conservation;Taping   PT Next Visit Plan DO NOT PUSH PAST 50-60% EXERTION PER MD; bed mobility, core work, standing tolerance, standing transfers. Trial quadruped, work seated on air pad.    PT Home Exercise Plan staying with HHPT/inpatient program for now, do not update yet    Consulted and Agree with Plan of Care Patient      Patient will benefit from skilled therapeutic intervention in order to improve the following deficits and impairments:  Abnormal gait, Improper body mechanics, Cardiopulmonary status limiting activity, Decreased coordination, Decreased mobility, Postural dysfunction, Decreased activity tolerance, Decreased endurance, Decreased strength, Decreased balance, Difficulty walking, Impaired flexibility  Visit Diagnosis: Difficulty in walking, not elsewhere classified - Plan: PT plan of care cert/re-cert  Unsteadiness on feet - Plan: PT plan of care cert/re-cert  Muscle weakness (generalized) - Plan: PT plan of care cert/re-cert  Other symptoms and signs involving the musculoskeletal system - Plan: PT plan of care cert/re-cert  Problem List Patient Active Problem List   Diagnosis Date Noted  . Bilateral leg weakness 01/24/2015  . Elevated CPK 01/24/2015  . Transaminitis 10/24/2014    Deniece Ree PT, DPT [place] [address] [place], [place], [address] Phone:  [phone]   Fax:  [phone]  Name: GRAINGER MCCARLEY MRN: [id number] Date of Birth: [birth year]

## 2016-03-18 NOTE — Patient Instructions (Signed)
1) Shoulder Protraction    Begin with elbows by your side, slowly "punch" straight out in front of you keeping arms/elbows straight.      2) Shoulder Flexion  Supine:     Standing:         Begin with arms at your side with thumbs pointed up, slowly raise both arms up and forward towards overhead.               3) Horizontal abduction/adduction  Supine:   Standing:           Begin with arms straight out in front of you, bring out to the side in at "T" shape. Keep arms straight entire time.                 4) Internal & External Rotation    *No band* -Stand with elbows at the side and elbows bent 90 degrees. Move your forearms away from your body, then bring back inward toward the body.     5) Shoulder Abduction  Supine:     Standing:       Lying on your back begin with your arms flat on the table next to your side. Slowly move your arms out to the side so that they go overhead, in a jumping jack or snow angel movement.    6) X to V arms (cheerleader move):  Begin with arms straight down, crossed in front of body in an "X". Keeping arms crossed, lift arms straight up overhead. Then spread arms apart into a "V" shape.  Bring back together into x and lower down to starting position.    7) W arms:  Begin with elbows bent and even with shoulders, hands pointing to ceiling. Keeping elbows at shoulder level, 1-shrug shoulders up, 2-squeeze shoulder blades together, and 3-relax.    Repeat all exercises 10-15 times, 1-2 times per day.

## 2016-03-19 ENCOUNTER — Ambulatory Visit (HOSPITAL_COMMUNITY): Payer: 59 | Admitting: Physical Therapy

## 2016-03-19 ENCOUNTER — Ambulatory Visit (HOSPITAL_COMMUNITY): Payer: 59

## 2016-03-19 ENCOUNTER — Encounter (HOSPITAL_COMMUNITY): Payer: Self-pay

## 2016-03-19 DIAGNOSIS — R262 Difficulty in walking, not elsewhere classified: Secondary | ICD-10-CM

## 2016-03-19 DIAGNOSIS — R2681 Unsteadiness on feet: Secondary | ICD-10-CM

## 2016-03-19 DIAGNOSIS — M6281 Muscle weakness (generalized): Secondary | ICD-10-CM

## 2016-03-19 DIAGNOSIS — R29898 Other symptoms and signs involving the musculoskeletal system: Secondary | ICD-10-CM

## 2016-03-19 NOTE — Therapy (Signed)
[place], [place], [address] Phone: [phone]   Fax:  [phone]  Occupational Therapy Treatment  Patient Details  Name: WELCOME FULTS MRN: [id number] Date of Birth: [birth year] Referring Provider: Dr. Asencion Noble  Encounter Date: 03/19/2016      OT End of Session - 03/19/16 1508    Visit Number 2   Number of Visits 16   Date for OT Re-Evaluation 04/29/16  mini reassess: 04/15/16   Authorization Type [employer]   Authorization Time Period Visit limit 60 - 0 used   Authorization - Visit Number 2   Authorization - Number of Visits 60   OT Start Time 1430   OT Stop Time 1515   OT Time Calculation (min) 45 min   Activity Tolerance Patient tolerated treatment well   Behavior During Therapy [facility] for tasks assessed/performed      Past Medical History:  Diagnosis Date  . Arthritis   . Hypercholesteremia   . Knee pain   . Necrotizing myopathy   . Polymyositis [facility])     Past Surgical History:  Procedure Laterality Date  . ANKLE SURGERY Left   . KNEE SURGERY Left     There were no vitals filed for this visit.      Subjective Assessment - 03/19/16 1437    Subjective  S: I haven't done my exercises today.   Currently in Pain? No/denies            [facility] OT Assessment - 03/19/16 1435      Assessment   Diagnosis BUE weakness     Precautions   Precautions Other (comment)   Precaution Comments CAN ONLY GO TO 50-60% OF MAX RIGHT NOW                   OT Treatments/Exercises (OP) - 03/19/16 1436      Transfers   Transfers Sit to Stand;Stand to Sit   Sit to Stand 5: Supervision   Stand to Sit 5: Supervision     Exercises   Exercises Shoulder;Elbow     Shoulder Exercises: Supine   Protraction PROM;12 reps   Horizontal ABduction AROM;12 reps   External Rotation AROM;12 reps   Internal Rotation AROM;12 reps   Flexion AROM;12 reps   ABduction AROM;12 reps     Shoulder  Exercises: Seated   Protraction AROM;10 reps   Horizontal ABduction AROM;10 reps   External Rotation AROM;10 reps   Internal Rotation AROM;10 reps   Flexion AROM;15 reps     Shoulder Exercises: ROM/Strengthening   Proximal Shoulder Strengthening, Supine 12X no rest breaks   Proximal Shoulder Strengthening, Seated 12X one rest break   Other ROM/Strengthening Exercises Tricep extension; RUE; 7X; 3#     Elbow Exercises   Bar Weights/Barbell (Elbow Flexion) 3 lbs  strengthening; 30X   Elbow Extension Strengthening  30X   Bar Weights/Barbell (Elbow Extension) 3 lbs   Other elbow exercises PNF pattern bicep curl; alternating shoulders'; 15X; 3#   Other elbow exercises Hammer curl; 3#; BUE; 20#                OT Education - 03/18/16 1535    Education provided Yes   Education Details Patient was encouraged to use red theraputty for grip strength and complete A/ROM shoulder exercises that he was completing prior in therapy.   Person(s) Educated Patient   Methods Explanation   Comprehension Verbalized understanding  OT Short Term Goals - 03/19/16 1437      OT SHORT TERM GOAL #1   Title Patient will be educated and independent with HEP to increase functional performance during daily tasks.    Time 4   Period Weeks   Status On-going     OT SHORT TERM GOAL #2   Title Patient will increase BUE shoulder and elbow strength to 4/5 to increase ability to complete lightweight lifting activities.   Time 4   Period Weeks   Status On-going     OT SHORT TERM GOAL #3   Title Patient will increase overal activity tolerance by completing a simple household task for 10-15 minutes without increased fatigue.    Time 4   Period Weeks   Status On-going     OT SHORT TERM GOAL #4   Title Patient will increase overall core strength to increase ability to complete sit to stands with less difficulty.     Time 4   Period Weeks   Status On-going           OT Long Term Goals  - 03/19/16 1437      OT LONG TERM GOAL #1   Title Pt will return to prior level of independence and functioning during daily and work tasks.    Time 8   Period Weeks   Status On-going     OT LONG TERM GOAL #2   Title Patient will increase BUE shoulder and elbow strength to 4+/5 or greater to increase ability to complete work related tasks as well as be able to get up from off the floor as needed.   Time 8   Period Weeks   Status On-going     OT LONG TERM GOAL #3   Title Patient will increase overal activity tolerance by completing a simple household task for 30-35 minutes without increased fatigue.    Time 8   Period Weeks   Status On-going     OT LONG TERM GOAL #4   Title Patient will increase BUE grip strength to within average norms for age in order to be able to open tight lids or containers with less difficulty.    Time 8   Period Weeks   Status On-going               Plan - 03/19/16 1506    Clinical Impression Statement A: Initiated UB A/ROM exercises supine and seated. Able to complete bicep strengthening with weight this session with increased difficulty when completing tricep extensions.   Plan P: Difficult time completing tricep extensions using weight. Attempt without weight. Complete core strengthening seated if able to tolerate.      Patient will benefit from skilled therapeutic intervention in order to improve the following deficits and impairments:  Decreased endurance, Decreased activity tolerance, Decreased strength  Visit Diagnosis: Other symptoms and signs involving the musculoskeletal system    Problem List Patient Active Problem List   Diagnosis Date Noted  . Bilateral leg weakness 01/24/2015  . Elevated CPK 01/24/2015  . Transaminitis 10/24/2014   Ailene Ravel, OTR/L,CBIS  [phone]  03/19/2016, 3:09 PM  [facility] [address] [place], [place], [address] Phone: [phone]   Fax:   [phone]  Name: CAMREN LIPSETT MRN: [id number] Date of Birth: September 01, 1955

## 2016-03-19 NOTE — Therapy (Signed)
[place], [place], [address] Phone: [phone]   Fax:  [phone]  Physical Therapy Treatment  Patient Details  Name: Jacob Hunter MRN: [id number] Date of Birth: 01/09/1956 Referring Provider: Asencion Noble   Encounter Date: 03/19/2016      PT End of Session - 03/19/16 1624    Visit Number 2   Number of Visits 16   Date for PT Re-Evaluation 04/15/16   Authorization Type [employer] (60 visits between PT/OT/speech)   Authorization Time Period 03/18/16 to 05/18/16   PT Start Time 2585   PT Stop Time 1427   PT Time Calculation (min) 38 min   Activity Tolerance Patient tolerated treatment well;Patient limited by fatigue   Behavior During Therapy [facility] for tasks assessed/performed      Past Medical History:  Diagnosis Date  . Arthritis   . Hypercholesteremia   . Knee pain   . Necrotizing myopathy   . Polymyositis [facility])     Past Surgical History:  Procedure Laterality Date  . ANKLE SURGERY Left   . KNEE SURGERY Left     There were no vitals filed for this visit.      Subjective Assessment - 03/19/16 1411    Subjective Patient arrives stating he is doing well, no major changes since yesterday   Currently in Pain? No/denies                         [facility] Adult PT Treatment/Exercise - 03/19/16 0001      Ambulation/Gait   Ambulation/Gait Yes   Ambulation Distance (Feet) 131 Feet   Assistive device Rolling walker   Gait Comments limited by fatigue      Knee/Hip Exercises: Standing   Other Standing Knee Exercises static stance in [facility] BOS, progressively narrowing tandem stance 2x each side    Other Standing Knee Exercises cone reaches with PT tech moving cones/PT guarding patient, min guard      Knee/Hip Exercises: Seated   Other Seated Knee/Hip Exercises cone rotations at edge of mat table 4x5     Knee/Hip Exercises: Supine   Bridges 2 sets;5 reps   Bridges  Limitations AAROM    Straight Leg Raises Both;2 sets;5 reps   Straight Leg Raises Limitations AAROM   Other Supine Knee/Hip Exercises supine hip ABD 2x5 red TB AAROM                 PT Education - 03/19/16 1624    Education provided Yes   Education Details reviewed initial eval and goals    Person(s) Educated Patient   Methods Explanation   Comprehension Verbalized understanding          PT Short Term Goals - 03/18/16 1533      PT SHORT TERM GOAL #1   Title Patient to be independent in all bed mobility in order to improve function and QOL at home    Time 4   Period Weeks   Status New     PT SHORT TERM GOAL #2   Title Patient to be able to tolerate at least 5 minutes of unsupported standing with RPE no more than 5/10 in order to demonstrate improved functional activity tolerance and balance    Time 4   Period Weeks   Status New     PT SHORT TERM GOAL #3   Title Patient to be able to ambulate at least 429f with LRAD  and Mod(I), RPE no more than 5/10 in order to demonstrate improved mobility and QOL    Time 4   Period Weeks   Status New     PT SHORT TERM GOAL #4   Title Patient to be able to perform stand-pivot transfers with LRAD on a Mod(I) basis in order to improve QOL and function at home    Time 4   Period Weeks   Status New     PT SHORT TERM GOAL #5   Title Patient to be indepedent in correctly and consistently performing appropriate HEP, to be updated PRN   Time 4   Period Weeks   Status New           PT Long Term Goals - 03/18/16 1537      PT LONG TERM GOAL #1   Title Patient to demonstrate proximal muscle strength at least 4/5 in order to assist in improving balance and tolerance to closed chain activities such as gait    Time 8   Period Weeks   Status New     PT LONG TERM GOAL #2   Title Patient to be able to tolerate 15 minutes of unsupported standing in order to demonstrate improved functional activity tolernace and strength, assist in  self care based activities    Time 8   Period Weeks   Status New     PT LONG TERM GOAL #3   Title Patient to be able to ambulate at least 1075f with LRAD, RPE no more than 5/10, with Mod(I) in order to improve general mobility and allow patient to access community    Time 8   Period Weeks   Status New     PT LONG TERM GOAL #4   Title Patient to be able to hold unsupported tandem stance at least 60 seconds B and SLS at least 30 seconds B in order to demonstrate improved balance and reduced fall risk    Time 8   Period Weeks   Status New     PT LONG TERM GOAL #5   Title Patient to be educated on benefits of and participatory in water exercise program at least 2 times per week in order to maintain functional gains and promote regular activity within safe and well tolerated environment   Time 8   Period Weeks   Status New               Plan - 03/19/16 1625    Clinical Impression Statement Began session with functional strengthening in supine with assist provided during exercise due to inability to attain full ROM of exercise due to weakness today; also performed cone rotations today with feet supported and cones placed at progressively more challenging spots. Worked on standing tolerance with good BOS and in tandem stance, as well as cone reaches in standing all with Min guard. Finished session with gait, which improved today at 138fwith walker before patient became fatigued. Reviewed initial eval and goals during rest breaks throughout session.    Rehab Potential Good   Clinical Impairments Affecting Rehab Potential chronic neuromuscular disease    PT Frequency 2x / week   PT Duration 8 weeks   PT Treatment/Interventions ADLs/Self Care Home Management;Biofeedback;Cryotherapy;Moist Heat;DME Instruction;Gait training;Stair training;Functional mobility training;Therapeutic activities;Therapeutic exercise;Balance training;Neuromuscular re-education;Patient/family education;Manual  techniques;Energy conservation;Taping   PT Next Visit Plan DO NOT PUSH PAST 50-60% EXERTION PER MD; bed mobility, core work, standing tolerance, standing transfers. Trial quadruped, work seated on air pad.  PT Home Exercise Plan staying with HHPT/inpatient program for now, do not update yet    Consulted and Agree with Plan of Care Patient      Patient will benefit from skilled therapeutic intervention in order to improve the following deficits and impairments:  Abnormal gait, Improper body mechanics, Cardiopulmonary status limiting activity, Decreased coordination, Decreased mobility, Postural dysfunction, Decreased activity tolerance, Decreased endurance, Decreased strength, Decreased balance, Difficulty walking, Impaired flexibility  Visit Diagnosis: Difficulty in walking, not elsewhere classified  Unsteadiness on feet  Muscle weakness (generalized)  Other symptoms and signs involving the musculoskeletal system     Problem List Patient Active Problem List   Diagnosis Date Noted  . Bilateral leg weakness 01/24/2015  . Elevated CPK 01/24/2015  . Transaminitis 10/24/2014    Deniece Ree PT, DPT [facility] [address] [place], [place], [address] Phone: [phone]   Fax:  [phone]  Name: Jacob Hunter MRN: [id number] Date of Birth: 12/15/1955

## 2016-03-22 ENCOUNTER — Encounter (HOSPITAL_COMMUNITY): Payer: 59

## 2016-03-26 ENCOUNTER — Ambulatory Visit (HOSPITAL_COMMUNITY): Payer: 59

## 2016-03-26 ENCOUNTER — Encounter (HOSPITAL_COMMUNITY): Payer: Self-pay

## 2016-03-26 DIAGNOSIS — R29898 Other symptoms and signs involving the musculoskeletal system: Secondary | ICD-10-CM

## 2016-03-26 DIAGNOSIS — R262 Difficulty in walking, not elsewhere classified: Secondary | ICD-10-CM

## 2016-03-26 DIAGNOSIS — M6281 Muscle weakness (generalized): Secondary | ICD-10-CM

## 2016-03-26 DIAGNOSIS — R2681 Unsteadiness on feet: Secondary | ICD-10-CM

## 2016-03-26 NOTE — Therapy (Signed)
[place], [place], [address] Phone: [phone]   Fax:  [phone]  Physical Therapy Treatment  Patient Details  Name: Jacob Hunter MRN: [id number] Date of Birth: 1955-12-25 Referring Provider: Asencion Noble   Encounter Date: 03/26/2016      PT End of Session - 03/26/16 1523    Visit Number 3   Number of Visits 16   Date for PT Re-Evaluation 04/15/16   Authorization Type [employer] (60 visits between PT/OT/speech)   Authorization Time Period 03/18/16 to 05/18/16   PT Start Time 0814   PT Stop Time 1600   PT Time Calculation (min) 44 min   Activity Tolerance Patient tolerated treatment well;Patient limited by fatigue   Behavior During Therapy [facility] for tasks assessed/performed      Past Medical History:  Diagnosis Date  . Arthritis   . Hypercholesteremia   . Knee pain   . Necrotizing myopathy   . Polymyositis [facility])     Past Surgical History:  Procedure Laterality Date  . ANKLE SURGERY Left   . KNEE SURGERY Left     There were no vitals filed for this visit.      Subjective Assessment - 03/26/16 1509    Subjective Pt stated he is feeling good today, no reports of pain today.  Reports compliance with HHPT HEP, reports 7 minutes tops length of time able to stand prior fatigue   Pertinent History history of L knee arthroscopic surgery, history of L ankle surgery, no other signifcant PMH    Patient Stated Goals improve mobilty, get stronger    Currently in Pain? No/denies             [facility] Adult PT Treatment/Exercise - 03/26/16 0001      Ambulation/Gait   Ambulation/Gait Yes   Ambulation Distance (Feet) 220 Feet   Assistive device Rolling walker   Gait Comments limited by fatigue increased to 70% with rest break following     Knee/Hip Exercises: Standing   Other Standing Knee Exercises cone reaches sitting on dynadisc     Knee/Hip Exercises: Seated   Other Seated  Knee/Hip Exercises theraband ankle all directions 10x each direction; seated cone rotation both directions     Knee/Hip Exercises: Supine   Bridges 2 sets;10 reps   Bridges Limitations AAROM      Knee/Hip Exercises: Prone   Hip Extension AAROM   Hip Extension Limitations Prone pressup trial with quadruped, too weak unable to complete right now                  PT Short Term Goals - 03/18/16 1533      PT SHORT TERM GOAL #1   Title Patient to be independent in all bed mobility in order to improve function and QOL at home    Time 4   Period Weeks   Status New     PT SHORT TERM GOAL #2   Title Patient to be able to tolerate at least 5 minutes of unsupported standing with RPE no more than 5/10 in order to demonstrate improved functional activity tolerance and balance    Time 4   Period Weeks   Status New     PT SHORT TERM GOAL #3   Title Patient to be able to ambulate at least 460f with LRAD and Mod(I), RPE no more than 5/10 in order to demonstrate improved mobility and QOL    Time 4  Period Weeks   Status New     PT SHORT TERM GOAL #4   Title Patient to be able to perform stand-pivot transfers with LRAD on a Mod(I) basis in order to improve QOL and function at home    Time 4   Period Weeks   Status New     PT SHORT TERM GOAL #5   Title Patient to be indepedent in correctly and consistently performing appropriate HEP, to be updated PRN   Time 4   Period Weeks   Status New           PT Long Term Goals - 03/18/16 1537      PT LONG TERM GOAL #1   Title Patient to demonstrate proximal muscle strength at least 4/5 in order to assist in improving balance and tolerance to closed chain activities such as gait    Time 8   Period Weeks   Status New     PT LONG TERM GOAL #2   Title Patient to be able to tolerate 15 minutes of unsupported standing in order to demonstrate improved functional activity tolernace and strength, assist in self care based activities     Time 8   Period Weeks   Status New     PT LONG TERM GOAL #3   Title Patient to be able to ambulate at least 1076f with LRAD, RPE no more than 5/10, with Mod(I) in order to improve general mobility and allow patient to access community    Time 8   Period Weeks   Status New     PT LONG TERM GOAL #4   Title Patient to be able to hold unsupported tandem stance at least 60 seconds B and SLS at least 30 seconds B in order to demonstrate improved balance and reduced fall risk    Time 8   Period Weeks   Status New     PT LONG TERM GOAL #5   Title Patient to be educated on benefits of and participatory in water exercise program at least 2 times per week in order to maintain functional gains and promote regular activity within safe and well tolerated environment   Time 8   Period [place]- 03/26/16 1724    Clinical Impression Statement Continued session focus with core and proximal musculature strengthening exercises and increased distance with gait training to 220 feet with only min guard required.  Pt limited by fatigue following gait training to 60-70%.  Pt educated on importance of improving self awareness and not to push himself to that fatigue level.  No reports of pain through session.     Rehab Potential Good   Clinical Impairments Affecting Rehab Potential chronic neuromuscular disease    PT Frequency 2x / week   PT Duration 8 weeks   PT Treatment/Interventions ADLs/Self Care Home Management;Biofeedback;Cryotherapy;Moist Heat;DME Instruction;Gait training;Stair training;Functional mobility training;Therapeutic activities;Therapeutic exercise;Balance training;Neuromuscular re-education;Patient/family education;Manual techniques;Energy conservation;Taping   PT Next Visit Plan DO NOT PUSH PAST 50-60% EXERTION PER MD; bed mobility, core work, standing tolerance, standing transfers. Trial quadruped, work seated on air pad.       Patient will benefit  from skilled therapeutic intervention in order to improve the following deficits and impairments:  Abnormal gait, Improper body mechanics, Cardiopulmonary status limiting activity, Decreased coordination, Decreased mobility, Postural dysfunction, Decreased activity tolerance, Decreased endurance, Decreased strength, Decreased balance, Difficulty walking, Impaired flexibility  Visit Diagnosis: Other symptoms and signs involving the musculoskeletal system  Difficulty in walking, not elsewhere classified  Unsteadiness on feet  Muscle weakness (generalized)     Problem List Patient Active Problem List   Diagnosis Date Noted  . Bilateral leg weakness 01/24/2015  . Elevated CPK 01/24/2015  . Transaminitis 10/24/2014   Ihor Austin, [place]; [place]  Aldona Lento 03/26/2016, 5:32 PM  [facility] [address] [place], [place], [address] Phone: [phone]   Fax:  [phone]  Name: Jacob Hunter MRN: [id number] Date of Birth: June 15, 1956

## 2016-03-26 NOTE — Therapy (Signed)
[place], [place], [address] Phone: [phone]   Fax:  [phone]  Occupational Therapy Treatment  Patient Details  Name: CINDY BRINDISI MRN: [id number] Date of Birth: 1956-07-19 Referring Provider: Dr. Asencion Noble  Encounter Date: 03/26/2016      OT End of Session - 03/26/16 1748    Visit Number 3   Number of Visits 16   Date for OT Re-Evaluation 04/29/16  mini reassess: 04/15/16   Authorization Type [employer]   Authorization Time Period Visit limit 60 - 0 used   Authorization - Visit Number 3   Authorization - Number of Visits 60   OT Start Time 1600   OT Stop Time 1645   OT Time Calculation (min) 45 min   Activity Tolerance Patient tolerated treatment well   Behavior During Therapy [facility] for tasks assessed/performed      Past Medical History:  Diagnosis Date  . Arthritis   . Hypercholesteremia   . Knee pain   . Necrotizing myopathy   . Polymyositis [facility])     Past Surgical History:  Procedure Laterality Date  . ANKLE SURGERY Left   . KNEE SURGERY Left     There were no vitals filed for this visit.      Subjective Assessment - 03/26/16 1624    Subjective  S: I do the hardest exercises first at home so I'm not too worn out.    Currently in Pain? No/denies            [facility] OT Assessment - 03/26/16 1609      Assessment   Diagnosis BUE weakness     Precautions   Precautions Other (comment)   Precaution Comments CAN ONLY GO TO 50-60% OF MAX RIGHT NOW                   OT Treatments/Exercises (OP) - 03/26/16 1609      Transfers   Transfers Sit to Stand;Stand to Sit   Sit to Stand 5: Supervision   Stand to Sit 5: Supervision     Exercises   Exercises Shoulder;Elbow;Work Designer, industrial/product Exercises: Supine   Protraction AROM;12 reps   Horizontal ABduction AROM;12 reps   External Rotation AROM;12 reps   Internal Rotation AROM;12 reps   Flexion AROM;12  reps   ABduction AROM;12 reps   Other Supine Exercises X to V arms; 12X     Shoulder Exercises: Seated   Protraction AROM;10 reps   Horizontal ABduction AROM;10 reps   External Rotation AROM;10 reps   Internal Rotation AROM;10 reps   Flexion AROM;10 reps     Shoulder Exercises: ROM/Strengthening   X to V Arms 10X   Proximal Shoulder Strengthening, Supine 12X no rest breaks     Elbow Exercises   Bar Weights/Barbell (Elbow Flexion) 3 lbs  12X   Elbow Extension Strengthening;15 reps;Both   Bar Weights/Barbell (Elbow Extension) 3 lbs   Other elbow exercises PNF pattern bicep curl; alternating shoulders'; 15X; 3#   Other elbow exercises Hammer curl; 3#; BUE; 15#     Neurological Re-education Exercises   Trunk Exercises Core Activation   Trunk Core Activation Supine abdominal twist with knees bent; 10X each side. Seated twist and alternating elbow to knee crush; 10X                  OT Short Term Goals - 03/19/16 1437      OT SHORT TERM  GOAL #1   Title Patient will be educated and independent with HEP to increase functional performance during daily tasks.    Time 4   Period Weeks   Status On-going     OT SHORT TERM GOAL #2   Title Patient will increase BUE shoulder and elbow strength to 4/5 to increase ability to complete lightweight lifting activities.   Time 4   Period Weeks   Status On-going     OT SHORT TERM GOAL #3   Title Patient will increase overal activity tolerance by completing a simple household task for 10-15 minutes without increased fatigue.    Time 4   Period Weeks   Status On-going     OT SHORT TERM GOAL #4   Title Patient will increase overall core strength to increase ability to complete sit to stands with less difficulty.     Time 4   Period Weeks   Status On-going           OT Long Term Goals - 03/19/16 1437      OT LONG TERM GOAL #1   Title Pt will return to prior level of independence and functioning during daily and work  tasks.    Time 8   Period Weeks   Status On-going     OT LONG TERM GOAL #2   Title Patient will increase BUE shoulder and elbow strength to 4+/5 or greater to increase ability to complete work related tasks as well as be able to get up from off the floor as needed.   Time 8   Period Weeks   Status On-going     OT LONG TERM GOAL #3   Title Patient will increase overal activity tolerance by completing a simple household task for 30-35 minutes without increased fatigue.    Time 8   Period Weeks   Status On-going     OT LONG TERM GOAL #4   Title Patient will increase BUE grip strength to within average norms for age in order to be able to open tight lids or containers with less difficulty.    Time 8   Period Weeks   Status On-going               Plan - 03/26/16 1748    Clinical Impression Statement A: Added additional seated/supine core strengthening exercises this session. patient continues to require frequent rest breaks as to no over exert himself.    Plan P: Continue with UB strengthening and core strengthening exercises.       Patient will benefit from skilled therapeutic intervention in order to improve the following deficits and impairments:  Decreased endurance, Decreased activity tolerance, Decreased strength  Visit Diagnosis: Other symptoms and signs involving the musculoskeletal system    Problem List Patient Active Problem List   Diagnosis Date Noted  . Bilateral leg weakness 01/24/2015  . Elevated CPK 01/24/2015  . Transaminitis 10/24/2014   Ailene Ravel, OTR/L,CBIS  [phone]  03/26/2016, 5:55 PM  [place] [address] [place], [place], [address] Phone: [phone]   Fax:  [phone]  Name: NEWMAN WAREN MRN: [id number] Date of Birth: Jul 05, 1956

## 2016-03-28 ENCOUNTER — Ambulatory Visit (HOSPITAL_COMMUNITY): Payer: 59

## 2016-03-28 ENCOUNTER — Encounter (HOSPITAL_COMMUNITY): Payer: Self-pay

## 2016-03-28 DIAGNOSIS — R29898 Other symptoms and signs involving the musculoskeletal system: Secondary | ICD-10-CM | POA: Diagnosis not present

## 2016-03-28 DIAGNOSIS — M6281 Muscle weakness (generalized): Secondary | ICD-10-CM

## 2016-03-28 DIAGNOSIS — R2681 Unsteadiness on feet: Secondary | ICD-10-CM

## 2016-03-28 DIAGNOSIS — R262 Difficulty in walking, not elsewhere classified: Secondary | ICD-10-CM

## 2016-03-28 NOTE — Therapy (Signed)
[place], [place], [address] Phone: [phone]   Fax:  [phone]  Physical Therapy Treatment  Patient Details  Name: Jacob Hunter MRN: [id number] Date of Birth: 1956-06-19 Referring Provider: Asencion Noble   Encounter Date: 03/28/2016      PT End of Session - 03/28/16 1526    Visit Number 4   Number of Visits 16   Date for PT Re-Evaluation 04/15/16   Authorization Type [employer] (60 visits between PT/OT/speech)   Authorization Time Period 03/18/16 to 05/18/16   PT Start Time 1520   PT Stop Time 1602   PT Time Calculation (min) 42 min   Activity Tolerance Patient tolerated treatment well;Patient limited by fatigue   Behavior During Therapy [facility] for tasks assessed/performed      Past Medical History:  Diagnosis Date  . Arthritis   . Hypercholesteremia   . Knee pain   . Necrotizing myopathy   . Polymyositis [facility])     Past Surgical History:  Procedure Laterality Date  . ANKLE SURGERY Left   . KNEE SURGERY Left     There were no vitals filed for this visit.      Subjective Assessment - 03/28/16 1523    Subjective No reports of pain of pain today.  Reports he completed some walking at home today for a minute or two to restroom and back.  Wishes to focus on strengthening today.  Fatigue level at 5/10 at entrance.   Pertinent History history of L knee arthroscopic surgery, history of L ankle surgery, no other signifcant PMH    Patient Stated Goals improve mobilty, get stronger    Currently in Pain? No/denies                         [facility] Adult PT Treatment/Exercise - 03/28/16 0001      Transfers   Sit to Stand 5: Supervision   Number of Reps Other reps (comment)  3 reps   Comments HHA required due to weakness     Knee/Hip Exercises: Seated   Other Seated Knee/Hip Exercises seated on dynadisc reaching forward then overhead; cone rotation     Knee/Hip  Exercises: Supine   Heel Slides Both;2 sets;5 reps   Heel Slides Limitations +   Bridges 2 sets;10 reps   Bridges Limitations AAROM    Other Supine Knee/Hip Exercises supine hip ABD 2x5 red TB AAROM    Other Supine Knee/Hip Exercises marching 2x 5                  PT Short Term Goals - 03/18/16 1533      PT SHORT TERM GOAL #1   Title Patient to be independent in all bed mobility in order to improve function and QOL at home    Time 4   Period Weeks   Status New     PT SHORT TERM GOAL #2   Title Patient to be able to tolerate at least 5 minutes of unsupported standing with RPE no more than 5/10 in order to demonstrate improved functional activity tolerance and balance    Time 4   Period Weeks   Status New     PT SHORT TERM GOAL #3   Title Patient to be able to ambulate at least 485f with LRAD and Mod(I), RPE no more than 5/10 in order to demonstrate improved mobility and QOL    Time 4  Period Weeks   Status New     PT SHORT TERM GOAL #4   Title Patient to be able to perform stand-pivot transfers with LRAD on a Mod(I) basis in order to improve QOL and function at home    Time 4   Period Weeks   Status New     PT SHORT TERM GOAL #5   Title Patient to be indepedent in correctly and consistently performing appropriate HEP, to be updated PRN   Time 4   Period Weeks   Status New           PT Long Term Goals - 03/18/16 1537      PT LONG TERM GOAL #1   Title Patient to demonstrate proximal muscle strength at least 4/5 in order to assist in improving balance and tolerance to closed chain activities such as gait    Time 8   Period Weeks   Status New     PT LONG TERM GOAL #2   Title Patient to be able to tolerate 15 minutes of unsupported standing in order to demonstrate improved functional activity tolernace and strength, assist in self care based activities    Time 8   Period Weeks   Status New     PT LONG TERM GOAL #3   Title Patient to be able to  ambulate at least 1032f with LRAD, RPE no more than 5/10, with Mod(I) in order to improve general mobility and allow patient to access community    Time 8   Period Weeks   Status New     PT LONG TERM GOAL #4   Title Patient to be able to hold unsupported tandem stance at least 60 seconds B and SLS at least 30 seconds B in order to demonstrate improved balance and reduced fall risk    Time 8   Period Weeks   Status New     PT LONG TERM GOAL #5   Title Patient to be educated on benefits of and participatory in water exercise program at least 2 times per week in order to maintain functional gains and promote regular activity within safe and well tolerated environment   Time 8[place]ugby- 03/28/16 1702    Clinical Impression Statement Session focus on core and proximal musculature strengtheing and improving self awareness with fatigue leveles.  Increased rest breaks between exercises and monitored fatigue levels during session with majority of faigue levels at 50%.  Added seated forward reach and overhead to improve core strenghteing on dynadisc.  No reoprts of pain.     Rehab Potential Good   Clinical Impairments Affecting Rehab Potential chronic neuromuscular disease    PT Frequency 2x / week   PT Duration 8 weeks   PT Treatment/Interventions ADLs/Self Care Home Management;Biofeedback;Cryotherapy;Moist Heat;DME Instruction;Gait training;Stair training;Functional mobility training;Therapeutic activities;Therapeutic exercise;Balance training;Neuromuscular re-education;Patient/family education;Manual techniques;Energy conservation;Taping   PT Next Visit Plan DO NOT PUSH PAST 50-60% EXERTION PER MD; bed mobility, core work, standing tolerance, standing transfers. Trial quadruped, work seated on air pad.       Patient will benefit from skilled therapeutic intervention in order to improve the following deficits and impairments:  Abnormal gait,  Improper body mechanics, Cardiopulmonary status limiting activity, Decreased coordination, Decreased mobility, Postural dysfunction, Decreased activity tolerance, Decreased endurance, Decreased strength, Decreased balance, Difficulty walking, Impaired flexibility  Visit Diagnosis: Other symptoms and signs  involving the musculoskeletal system  Difficulty in walking, not elsewhere classified  Unsteadiness on feet  Muscle weakness (generalized)     Problem List Patient Active Problem List   Diagnosis Date Noted  . Bilateral leg weakness 01/24/2015  . Elevated CPK 01/24/2015  . Transaminitis 10/24/2014   Ihor Austin, [place]; [place]  [place] Lento 03/28/2016, 5:09 PM  [place] [address] [place], [place], [address] Phone: [phone]   Fax:  [phone]  Name: CARLETON VANVALKENBURGH MRN: [id number] Date of Birth: [birth year]

## 2016-03-28 NOTE — Therapy (Signed)
[place], [place], [address] Phone: [phone]   Fax:  [phone]  Occupational Therapy Treatment  Patient Details  Name: Jacob Hunter MRN: [id number] Date of Birth: 1955/10/15 Referring Provider: Dr. Asencion Noble  Encounter Date: 03/28/2016      OT End of Session - 03/28/16 1643    Visit Number 4   Number of Visits 16   Date for OT Re-Evaluation 04/29/16  mini reassess: 04/15/16   Authorization Type [employer]   Authorization Time Period Visit limit 60 - 0 used   Authorization - Visit Number 4   Authorization - Number of Visits 60   OT Start Time 4496  Pt in bathroom.   OT Stop Time 1645   OT Time Calculation (min) 33 min   Activity Tolerance Patient tolerated treatment well   Behavior During Therapy [facility] for tasks assessed/performed      Past Medical History:  Diagnosis Date  . Arthritis   . Hypercholesteremia   . Knee pain   . Necrotizing myopathy   . Polymyositis [facility])     Past Surgical History:  Procedure Laterality Date  . ANKLE SURGERY Left   . KNEE SURGERY Left     There were no vitals filed for this visit.      Subjective Assessment - 03/28/16 1630    Subjective  S: Yesterday was rough day. I pushed it really hard last time.    Currently in Pain? No/denies            [facility] OT Assessment - 03/28/16 1630      Assessment   Diagnosis BUE weakness     Precautions   Precautions Other (comment)   Precaution Comments CAN ONLY GO TO 50-60% OF MAX RIGHT NOW                   OT Treatments/Exercises (OP) - 03/28/16 1630      Exercises   Exercises Shoulder     Shoulder Exercises: Supine   Protraction 10 reps  Hold green therapy ball 65 cm   Horizontal ABduction 10 reps  Hold green therapy ball 65 cm   Flexion 10 reps  Hold green therapy ball 65 cm   Other Supine Exercises Diagonal wood chops; circles both directions; 10X; hold green therapy ball 65 cm      Shoulder Exercises: Seated   Other Seated Exercises Seated isometric conditioning using large green therapy ball (65 cm) protraction (ball against the wall) and extension (ball on floor with arms extended pushing down into ball). 3x10"                  OT Short Term Goals - 03/19/16 1437      OT SHORT TERM GOAL #1   Title Patient will be educated and independent with HEP to increase functional performance during daily tasks.    Time 4   Period Weeks   Status On-going     OT SHORT TERM GOAL #2   Title Patient will increase BUE shoulder and elbow strength to 4/5 to increase ability to complete lightweight lifting activities.   Time 4   Period Weeks   Status On-going     OT SHORT TERM GOAL #3   Title Patient will increase overal activity tolerance by completing a simple household task for 10-15 minutes without increased fatigue.    Time 4   Period Weeks   Status On-going     OT  SHORT TERM GOAL #4   Title Patient will increase overall core strength to increase ability to complete sit to stands with less difficulty.     Time 4   Period Weeks   Status On-going           OT Long Term Goals - 03/19/16 1437      OT LONG TERM GOAL #1   Title Pt will return to prior level of independence and functioning during daily and work tasks.    Time 8   Period Weeks   Status On-going     OT LONG TERM GOAL #2   Title Patient will increase BUE shoulder and elbow strength to 4+/5 or greater to increase ability to complete work related tasks as well as be able to get up from off the floor as needed.   Time 8   Period Weeks   Status On-going     OT LONG TERM GOAL #3   Title Patient will increase overal activity tolerance by completing a simple household task for 30-35 minutes without increased fatigue.    Time 8   Period Weeks   Status On-going     OT LONG TERM GOAL #4   Title Patient will increase BUE grip strength to within average norms for age in order to be able to  open tight lids or containers with less difficulty.    Time 8   Period Weeks   Status On-going               Plan - 03/28/16 1644    Clinical Impression Statement A: Completed shoulder stability and core strengthening exercise using therapy ball. Pt requried frequent rest breaks as to not over exert self. VC for form and technique as needed. Modification used when needed as well.    Plan P: Continue with UB strength and core strengthening exercises.       Patient will benefit from skilled therapeutic intervention in order to improve the following deficits and impairments:  Decreased endurance, Decreased activity tolerance, Decreased strength  Visit Diagnosis: Other symptoms and signs involving the musculoskeletal system    Problem List Patient Active Problem List   Diagnosis Date Noted  . Bilateral leg weakness 01/24/2015  . Elevated CPK 01/24/2015  . Transaminitis 10/24/2014   Jacob Hunter, OTR/L,CBIS  [phone]  03/28/2016, 4:56 PM  [place] [address] [place], [place], [address] Phone: [phone]   Fax:  [phone]  Name: Jacob Hunter MRN: [id number] Date of Birth: 05-15-1956

## 2016-04-02 ENCOUNTER — Ambulatory Visit (HOSPITAL_COMMUNITY): Payer: 59 | Admitting: Occupational Therapy

## 2016-04-02 ENCOUNTER — Ambulatory Visit (HOSPITAL_COMMUNITY): Payer: 59 | Admitting: Physical Therapy

## 2016-04-02 ENCOUNTER — Encounter (HOSPITAL_COMMUNITY): Payer: Self-pay | Admitting: Occupational Therapy

## 2016-04-02 DIAGNOSIS — R262 Difficulty in walking, not elsewhere classified: Secondary | ICD-10-CM

## 2016-04-02 DIAGNOSIS — R2681 Unsteadiness on feet: Secondary | ICD-10-CM

## 2016-04-02 DIAGNOSIS — R29898 Other symptoms and signs involving the musculoskeletal system: Secondary | ICD-10-CM

## 2016-04-02 DIAGNOSIS — M6281 Muscle weakness (generalized): Secondary | ICD-10-CM

## 2016-04-02 NOTE — Therapy (Signed)
[place], [place], [address] Phone: [phone]   Fax:  [phone]  Occupational Therapy Treatment  Patient Details  Name: Jacob Hunter MRN: [id number] Date of Birth: 01-04-1956 Referring Provider: Dr. Asencion Noble  Encounter Date: 04/02/2016      OT End of Session - 04/02/16 1428    Visit Number 5   Number of Visits 16   Date for OT Re-Evaluation 04/29/16  mini reassess: 04/15/16   Authorization Type [employer]   Authorization Time Period Visit limit 60 - 0 used   Authorization - Visit Number 5   Authorization - Number of Visits 109   OT Start Time 8786   OT Stop Time 1431   OT Time Calculation (min) 43 min   Activity Tolerance Patient tolerated treatment well   Behavior During Therapy [facility] for tasks assessed/performed      Past Medical History:  Diagnosis Date  . Arthritis   . Hypercholesteremia   . Knee pain   . Necrotizing myopathy   . Polymyositis [facility])     Past Surgical History:  Procedure Laterality Date  . ANKLE SURGERY Left   . KNEE SURGERY Left     There were no vitals filed for this visit.      Subjective Assessment - 04/02/16 1353    Subjective  S: That session last time was rough.    Currently in Pain? No/denies            [facility] OT Assessment - 04/02/16 1353      Assessment   Diagnosis BUE weakness     Precautions   Precautions Other (comment)   Precaution Comments CAN ONLY GO TO 50-60% OF MAX RIGHT NOW                   OT Treatments/Exercises (OP) - 04/02/16 1354      Exercises   Exercises Shoulder     Shoulder Exercises: Supine   Protraction AROM;10 reps   Horizontal ABduction AROM;10 reps   External Rotation AROM;12 reps   Internal Rotation AROM;12 reps   Flexion AROM;12 reps   ABduction AROM;12 reps   Other Supine Exercises Diagonal wood chops; circles both directions; 10X; hold green therapy ball 65 cm   Other Supine Exercises x to v  arms, A/ROM, 10X     Shoulder Exercises: ROM/Strengthening   Proximal Shoulder Strengthening, Supine 12X no rest breaks     Elbow Exercises   Other elbow exercises PNF pattern bicep curl; alternating shoulders'; 15X; 3#     Neurological Re-education Exercises   Bar Weights/Barbell (Elbow Flexion) 3 lbs  15 reps   Elbow Extension Strengthening;15 reps;Both   Bar Weights/Barbell (Elbow Extension) 3 lbs                  OT Short Term Goals - 03/19/16 1437      OT SHORT TERM GOAL #1   Title Patient will be educated and independent with HEP to increase functional performance during daily tasks.    Time 4   Period Weeks   Status On-going     OT SHORT TERM GOAL #2   Title Patient will increase BUE shoulder and elbow strength to 4/5 to increase ability to complete lightweight lifting activities.   Time 4   Period Weeks   Status On-going     OT SHORT TERM GOAL #3   Title Patient will increase overal activity tolerance by completing a simple  household task for 10-15 minutes without increased fatigue.    Time 4   Period Weeks   Status On-going     OT SHORT TERM GOAL #4   Title Patient will increase overall core strength to increase ability to complete sit to stands with less difficulty.     Time 4   Period Weeks   Status On-going           OT Long Term Goals - 03/19/16 1437      OT LONG TERM GOAL #1   Title Pt will return to prior level of independence and functioning during daily and work tasks.    Time 8   Period Weeks   Status On-going     OT LONG TERM GOAL #2   Title Patient will increase BUE shoulder and elbow strength to 4+/5 or greater to increase ability to complete work related tasks as well as be able to get up from off the floor as needed.   Time 8   Period Weeks   Status On-going     OT LONG TERM GOAL #3   Title Patient will increase overal activity tolerance by completing a simple household task for 30-35 minutes without increased fatigue.     Time 8   Period Weeks   Status On-going     OT LONG TERM GOAL #4   Title Patient will increase BUE grip strength to within average norms for age in order to be able to open tight lids or containers with less difficulty.    Time 8   Period Weeks   Status On-going               Plan - 04/02/16 1452    Clinical Impression Statement A: Continued with shoulder stability and core strengthening using therapy ball this session, resumed A/ROM supine. Frequent rest breaks so no over exertion experienced. Verbal cuing for form, technique, and speed. Resumed elbow strengthening, verbal cuing for modifications as needed.    Rehab Potential Excellent   OT Frequency 2x / week   OT Duration 8 weeks   OT Treatment/Interventions Self-care/ADL training;Electrical Stimulation;Therapeutic activities;DME and/or AE instruction;Energy conservation;Therapeutic exercises;Neuromuscular education;Patient/family education   Plan P: Continue with UB and core strengthening       Patient will benefit from skilled therapeutic intervention in order to improve the following deficits and impairments:  Decreased endurance, Decreased activity tolerance, Decreased strength  Visit Diagnosis: Other symptoms and signs involving the musculoskeletal system  Muscle weakness of left arm  Muscle weakness of right arm    Problem List Patient Active Problem List   Diagnosis Date Noted  . Bilateral leg weakness 01/24/2015  . Elevated CPK 01/24/2015  . Transaminitis 10/24/2014   Guadelupe Sabin, OTR/L  [phone] 04/02/2016, 2:55 PM  [place] [address] [place], [place], [address] Phone: [phone]   Fax:  [phone]  Name: HERMENEGILDO CLAUSEN MRN: [id number] Date of Birth: 01-01-1956

## 2016-04-02 NOTE — Therapy (Signed)
[place], [place], [address] Phone: [phone]   Fax:  [phone]  Physical Therapy Treatment  Patient Details  Name: Jacob Hunter MRN: [id number] Date of Birth: [birth year] Referring Provider: Asencion Noble   Encounter Date: 04/02/2016      PT End of Session - 04/02/16 1612    Visit Number 5   Number of Visits 16   Date for PT Re-Evaluation 04/15/16   Authorization Type [employer] (60 visits between PT/OT/speech)   Authorization Time Period 03/18/16 to 05/18/16   PT Start Time 1304   PT Stop Time 1339  patient requested a few minutes break/early end before OT due to fatigue    PT Time Calculation (min) 35 min   Activity Tolerance Patient tolerated treatment well;Patient limited by fatigue   Behavior During Therapy [facility] for tasks assessed/performed      Past Medical History:  Diagnosis Date  . Arthritis   . Hypercholesteremia   . Knee pain   . Necrotizing myopathy   . Polymyositis [facility])     Past Surgical History:  Procedure Laterality Date  . ANKLE SURGERY Left   . KNEE SURGERY Left     There were no vitals filed for this visit.      Subjective Assessment - 04/02/16 1308    Subjective patient arrives today reporting he is doing well, nothing real major going on. wants to keep pushing his walking.    Pertinent History history of L knee arthroscopic surgery, history of L ankle surgery, no other signifcant PMH    Currently in Pain? No/denies                         [facility] Adult PT Treatment/Exercise - 04/02/16 0001      Ambulation/Gait   Ambulation/Gait Yes   Ambulation/Gait Assistance 5: Supervision   Ambulation Distance (Feet) --  134f, 1912f   Assistive device Rolling walker   Gait Pattern Step-through pattern;Decreased stance time - left;Decreased step length - right;Decreased dorsiflexion - left;Decreased weight shift to left   Gait Comments limited  by fatigue      Knee/Hip Exercises: Seated   Other Seated Knee/Hip Exercises hip walks at edge of mat table x3, min guard-min(A) for trunk stability/control      Knee/Hip Exercises: Supine   Short Arc Quad Sets Both;Strengthening;10 reps;1 set   Short Arc Quad Sets Limitations 3 second holds   1# B LEs    Bridges 2 sets;10 reps   Bridges Limitations AAROM    Other Supine Knee/Hip Exercises supine hip ABD 2x10 with red TB                 PT Education - 04/02/16 1612    Education provided No          PT Short Term Goals - 03/18/16 1533      PT SHORT TERM GOAL #1   Title Patient to be independent in all bed mobility in order to improve function and QOL at home    Time 4   Period Weeks   Status New     PT SHORT TERM GOAL #2   Title Patient to be able to tolerate at least 5 minutes of unsupported standing with RPE no more than 5/10 in order to demonstrate improved functional activity tolerance and balance    Time 4   Period Weeks   Status New  PT SHORT TERM GOAL #3   Title Patient to be able to ambulate at least 435f with LRAD and Mod(I), RPE no more than 5/10 in order to demonstrate improved mobility and QOL    Time 4   Period Weeks   Status New     PT SHORT TERM GOAL #4   Title Patient to be able to perform stand-pivot transfers with LRAD on a Mod(I) basis in order to improve QOL and function at home    Time 4   Period Weeks   Status New     PT SHORT TERM GOAL #5   Title Patient to be indepedent in correctly and consistently performing appropriate HEP, to be updated PRN   Time 4   Period Weeks   Status New           PT Long Term Goals - 03/18/16 1537      PT LONG TERM GOAL #1   Title Patient to demonstrate proximal muscle strength at least 4/5 in order to assist in improving balance and tolerance to closed chain activities such as gait    Time 8   Period Weeks   Status New     PT LONG TERM GOAL #2   Title Patient to be able to tolerate 15  minutes of unsupported standing in order to demonstrate improved functional activity tolernace and strength, assist in self care based activities    Time 8   Period Weeks   Status New     PT LONG TERM GOAL #3   Title Patient to be able to ambulate at least 10014fwith LRAD, RPE no more than 5/10, with Mod(I) in order to improve general mobility and allow patient to access community    Time 8   Period Weeks   Status New     PT LONG TERM GOAL #4   Title Patient to be able to hold unsupported tandem stance at least 60 seconds B and SLS at least 30 seconds B in order to demonstrate improved balance and reduced fall risk    Time 8   Period Weeks   Status New     PT LONG TERM GOAL #5   Title Patient to be educated on benefits of and participatory in water exercise program at least 2 times per week in order to maintain functional gains and promote regular activity within safe and well tolerated environment   Time 8   Period Weeks   Status New               Plan - 04/02/16 1613    Clinical Impression Statement Continued with functional core/proximal muscle strengthening today along with functional activity tolerance/gait training this session. Introduced hip walking along mat table for improved proximal muscle coordination and core strength/endurance training, also continued trunk exercises seated at edge of mat table.  Rest breaks provided PRN throughout session. Recommend increased activities focusing on dynamic trunk strength/endurance on mat table as well as progression to quadruped.    Rehab Potential Good   Clinical Impairments Affecting Rehab Potential chronic neuromuscular disease    PT Frequency 2x / week   PT Duration 8 weeks   PT Treatment/Interventions ADLs/Self Care Home Management;Biofeedback;Cryotherapy;Moist Heat;DME Instruction;Gait training;Stair training;Functional mobility training;Therapeutic activities;Therapeutic exercise;Balance training;Neuromuscular  re-education;Patient/family education;Manual techniques;Energy conservation;Taping   PT Next Visit Plan DO NOT PUSH PAST 50-60% EXERTION PER MD; bed mobility, core work, standing tolerance, standing transfers. Trial quadruped, work seated on air pad. Continue hip walks on table.  Consulted and Agree with Plan of Care Patient      Patient will benefit from skilled therapeutic intervention in order to improve the following deficits and impairments:  Abnormal gait, Improper body mechanics, Cardiopulmonary status limiting activity, Decreased coordination, Decreased mobility, Postural dysfunction, Decreased activity tolerance, Decreased endurance, Decreased strength, Decreased balance, Difficulty walking, Impaired flexibility  Visit Diagnosis: Other symptoms and signs involving the musculoskeletal system  Difficulty in walking, not elsewhere classified  Unsteadiness on feet  Muscle weakness (generalized)     Problem List Patient Active Problem List   Diagnosis Date Noted  . Bilateral leg weakness 01/24/2015  . Elevated CPK 01/24/2015  . Transaminitis 10/24/2014    Deniece Ree PT, DPT [facility] [address] [place], [place], [address] Phone: [phone]   Fax:  [phone]  Name: Jacob Hunter MRN: [id number] Date of Birth: 1955/09/09

## 2016-04-04 ENCOUNTER — Ambulatory Visit (HOSPITAL_COMMUNITY): Payer: 59

## 2016-04-04 ENCOUNTER — Encounter (HOSPITAL_COMMUNITY): Payer: Self-pay

## 2016-04-04 DIAGNOSIS — R2681 Unsteadiness on feet: Secondary | ICD-10-CM

## 2016-04-04 DIAGNOSIS — Z789 Other specified health status: Secondary | ICD-10-CM

## 2016-04-04 DIAGNOSIS — R29898 Other symptoms and signs involving the musculoskeletal system: Secondary | ICD-10-CM

## 2016-04-04 DIAGNOSIS — R262 Difficulty in walking, not elsewhere classified: Secondary | ICD-10-CM

## 2016-04-04 DIAGNOSIS — M6281 Muscle weakness (generalized): Secondary | ICD-10-CM

## 2016-04-04 NOTE — Therapy (Signed)
[place], [place], [address] Phone: [phone]   Fax:  [phone]  Occupational Therapy Treatment  Patient Details  Name: Jacob Hunter MRN: [id number] Date of Birth: [birth year] Referring Provider: Dr. Asencion Noble  Encounter Date: 04/04/2016      OT End of Session - 04/04/16 1408    Visit Number 6   Number of Visits 16   Date for OT Re-Evaluation 04/29/16  mini reassess: 04/15/16   Authorization Type [employer]   Authorization Time Period Visit limit 60 - 0 used   Authorization - Visit Number 6   Authorization - Number of Visits 106   OT Start Time 1030   OT Stop Time 1115   OT Time Calculation (min) 45 min   Activity Tolerance Patient tolerated treatment well   Behavior During Therapy [facility] for tasks assessed/performed      Past Medical History:  Diagnosis Date  . Arthritis   . Hypercholesteremia   . Knee pain   . Necrotizing myopathy   . Polymyositis [facility])     Past Surgical History:  Procedure Laterality Date  . ANKLE SURGERY Left   . KNEE SURGERY Left     There were no vitals filed for this visit.      Subjective Assessment - 04/04/16 1050    Subjective  S: My chest was sore after the last session.   Currently in Pain? No/denies                      OT Treatments/Exercises (OP) - 04/04/16 1051      Exercises   Exercises Shoulder;Hand     Shoulder Exercises: Seated   Protraction AROM;10 reps  Using green therapy ball   Flexion AROM;10 reps  using green therapy ball   Other Seated Exercises Diagonal wood chops both sides, and circles both directions; 10X     Hand Exercises   Hand Gripper with Large Beads all beads with gripper set at 69# (both)   Hand Gripper with Medium Beads all beads with gripper set at 69# (both)   Hand Gripper with Small Beads all beads with gripper set at 69# (Both)   Other Hand Exercises Pt provided adjustments to another patient's  w/c with their permission. Task included manupulating a wrench as well as sustained grip and wrist strength.       Neurological Re-education Exercises   Bar Weights/Barbell (Elbow Flexion) 3 lbs  15X   Elbow Extension Strengthening;15 reps;Both   Bar Weights/Barbell (Elbow Extension) 3 lbs                  OT Short Term Goals - 03/19/16 1437      OT SHORT TERM GOAL #1   Title Patient will be educated and independent with HEP to increase functional performance during daily tasks.    Time 4   Period Weeks   Status On-going     OT SHORT TERM GOAL #2   Title Patient will increase BUE shoulder and elbow strength to 4/5 to increase ability to complete lightweight lifting activities.   Time 4   Period Weeks   Status On-going     OT SHORT TERM GOAL #3   Title Patient will increase overal activity tolerance by completing a simple household task for 10-15 minutes without increased fatigue.    Time 4   Period Weeks   Status On-going     OT SHORT TERM GOAL #4  Title Patient will increase overall core strength to increase ability to complete sit to stands with less difficulty.     Time 4   Period Weeks   Status On-going           OT Long Term Goals - 03/19/16 1437      OT LONG TERM GOAL #1   Title Pt will return to prior level of independence and functioning during daily and work tasks.    Time 8   Period Weeks   Status On-going     OT LONG TERM GOAL #2   Title Patient will increase BUE shoulder and elbow strength to 4+/5 or greater to increase ability to complete work related tasks as well as be able to get up from off the floor as needed.   Time 8   Period Weeks   Status On-going     OT LONG TERM GOAL #3   Title Patient will increase overal activity tolerance by completing a simple household task for 30-35 minutes without increased fatigue.    Time 8   Period Weeks   Status On-going     OT LONG TERM GOAL #4   Title Patient will increase BUE grip strength  to within average norms for age in order to be able to open tight lids or containers with less difficulty.    Time 8   Period Weeks   Status On-going               Plan - 04/04/16 1408    Clinical Impression Statement A: Session focused on sustained grip strength and then ended with BUE strengthening using the big green therapy ball. Pt took frequent rest breaks to monitor exertion Verbal cues for form and technique.    Plan Continue with UB and corestrengthening.       Patient will benefit from skilled therapeutic intervention in order to improve the following deficits and impairments:  Decreased endurance, Decreased activity tolerance, Decreased strength  Visit Diagnosis: Other symptoms and signs involving the musculoskeletal system    Problem List Patient Active Problem List   Diagnosis Date Noted  . Bilateral leg weakness 01/24/2015  . Elevated CPK 01/24/2015  . Transaminitis 10/24/2014  Ailene Ravel, OTR/L,CBIS  [phone]  04/04/2016, 2:11 PM  [place] [address] [place], [place], [address] Phone: [phone]   Fax:  [phone]  Name: Jacob Hunter MRN: [id number] Date of Birth: 11-03-1955

## 2016-04-04 NOTE — Therapy (Signed)
[place], [place], [address] Phone: [phone]   Fax:  [phone]  Physical Therapy Treatment  Patient Details  Name: Jacob Hunter MRN: [id number] Date of Birth: [birth year] Referring Provider: Asencion Noble   Encounter Date: 04/04/2016      PT End of Session - 04/04/16 0952    Visit Number 6   Number of Visits 16   Date for PT Re-Evaluation 04/15/16   Authorization Type [employer] (60 visits between PT/OT/speech)   Authorization Time Period 03/18/16 to 05/18/16   PT Start Time 0948   PT Stop Time 1027   PT Time Calculation (min) 39 min   Equipment Utilized During Treatment Gait belt   Activity Tolerance Patient tolerated treatment well;Patient limited by fatigue   Behavior During Therapy [facility] for tasks assessed/performed      Past Medical History:  Diagnosis Date  . Arthritis   . Hypercholesteremia   . Knee pain   . Necrotizing myopathy   . Polymyositis [facility])     Past Surgical History:  Procedure Laterality Date  . ANKLE SURGERY Left   . KNEE SURGERY Left     There were no vitals filed for this visit.      Subjective Assessment - 04/04/16 0951    Subjective Pt stated he is sore all over, had a good workout last session.  Reports fatigue level at 50% initial session.     Pertinent History history of L knee arthroscopic surgery, history of L ankle surgery, no other signifcant PMH    Patient Stated Goals improve mobilty, get stronger    Currently in Pain? No/denies                         [facility] Adult PT Treatment/Exercise - 04/04/16 0001      Ambulation/Gait   Ambulation/Gait Yes   Ambulation/Gait Assistance 5: Supervision   Ambulation Distance (Feet) --  2 sets 1st 254; 2nd 264f   Assistive device Rolling walker   Gait Pattern Step-through pattern;Decreased stance time - left;Decreased step length - right;Decreased dorsiflexion - left;Decreased weight  shift to left   Gait Comments limited by fatigue      Knee/Hip Exercises: Standing   Heel Raises Both;10 reps   Heel Raises Limitations toe raises 10x     Knee/Hip Exercises: Seated   Other Seated Knee/Hip Exercises hip walks at edge of mat table x3, min guard-min(A) for trunk stability/control ; cone rotation on dynadisk with mat elevated 2RT                  PT Short Term Goals - 03/18/16 1533      PT SHORT TERM GOAL #1   Title Patient to be independent in all bed mobility in order to improve function and QOL at home    Time 4   Period Weeks   Status New     PT SHORT TERM GOAL #2   Title Patient to be able to tolerate at least 5 minutes of unsupported standing with RPE no more than 5/10 in order to demonstrate improved functional activity tolerance and balance    Time 4   Period Weeks   Status New     PT SHORT TERM GOAL #3   Title Patient to be able to ambulate at least 4063fwith LRAD and Mod(I), RPE no more than 5/10 in order to demonstrate improved mobility and QOL  Time 4   Period Weeks   Status New     PT SHORT TERM GOAL #4   Title Patient to be able to perform stand-pivot transfers with LRAD on a Mod(I) basis in order to improve QOL and function at home    Time 4   Period Weeks   Status New     PT SHORT TERM GOAL #5   Title Patient to be indepedent in correctly and consistently performing appropriate HEP, to be updated PRN   Time 4   Period Weeks   Status New           PT Long Term Goals - 03/18/16 1537      PT LONG TERM GOAL #1   Title Patient to demonstrate proximal muscle strength at least 4/5 in order to assist in improving balance and tolerance to closed chain activities such as gait    Time 8   Period Weeks   Status New     PT LONG TERM GOAL #2   Title Patient to be able to tolerate 15 minutes of unsupported standing in order to demonstrate improved functional activity tolernace and strength, assist in self care based activities     Time 8   Period Weeks   Status New     PT LONG TERM GOAL #3   Title Patient to be able to ambulate at least 1076f with LRAD, RPE no more than 5/10, with Mod(I) in order to improve general mobility and allow patient to access community    Time 8   Period Weeks   Status New     PT LONG TERM GOAL #4   Title Patient to be able to hold unsupported tandem stance at least 60 seconds B and SLS at least 30 seconds B in order to demonstrate improved balance and reduced fall risk    Time 8   Period Weeks   Status New     PT LONG TERM GOAL #5   Title Patient to be educated on benefits of and participatory in water exercise program at least 2 times per week in order to maintain functional gains and promote regular activity within safe and well tolerated environment   Time 8   Period Weeks   Status New               Plan - 04/04/16 1056    Clinical Impression Statement Continued session with focus with core and proximal musculature and improving activity tolerance with gait training.  Increased difficulty with core strengthening exercises by taking away LE support and sitting on dynamic surface with cone rotation as well as the hip walking.  Increased distance with gait training this session and began standing exercises to improve standing tolerance.  Pt tolerated session well with average fatgiue level from 5-6/10.  No reports of pain at end of session.     Rehab Potential Good   Clinical Impairments Affecting Rehab Potential chronic neuromuscular disease    PT Frequency 2x / week   PT Duration 8 weeks   PT Treatment/Interventions ADLs/Self Care Home Management;Biofeedback;Cryotherapy;Moist Heat;DME Instruction;Gait training;Stair training;Functional mobility training;Therapeutic activities;Therapeutic exercise;Balance training;Neuromuscular re-education;Patient/family education;Manual techniques;Energy conservation;Taping   PT Next Visit Plan DO NOT PUSH PAST 50-60% EXERTION PER MD; bed  mobility, core work, standing tolerance, standing transfers. Trial quadruped, work seated on air pad. Continue hip walks on table.       Patient will benefit from skilled therapeutic intervention in order to improve the following deficits and impairments:  Abnormal gait, Improper body mechanics, Cardiopulmonary status limiting activity, Decreased coordination, Decreased mobility, Postural dysfunction, Decreased activity tolerance, Decreased endurance, Decreased strength, Decreased balance, Difficulty walking, Impaired flexibility  Visit Diagnosis: Difficulty in walking, not elsewhere classified  Unsteadiness on feet  Muscle weakness (generalized)  Decreased activities of daily living (ADL)  Unsteady gait     Problem List Patient Active Problem List   Diagnosis Date Noted  . Bilateral leg weakness 01/24/2015  . Elevated CPK 01/24/2015  . Transaminitis 10/24/2014   Ihor Austin, [place]; [place]  Aldona Lento 04/04/2016, 11:09 AM  [place], [place], [address] Phone: [phone]   Fax:  [phone]  Name: Jacob Hunter MRN: [id number] Date of Birth: 28-Apr-1956

## 2016-04-09 ENCOUNTER — Encounter (HOSPITAL_COMMUNITY): Payer: Self-pay | Admitting: Occupational Therapy

## 2016-04-09 ENCOUNTER — Ambulatory Visit (HOSPITAL_COMMUNITY): Payer: 59 | Admitting: Physical Therapy

## 2016-04-09 ENCOUNTER — Ambulatory Visit (HOSPITAL_COMMUNITY): Payer: 59 | Admitting: Occupational Therapy

## 2016-04-09 DIAGNOSIS — R262 Difficulty in walking, not elsewhere classified: Secondary | ICD-10-CM

## 2016-04-09 DIAGNOSIS — R29898 Other symptoms and signs involving the musculoskeletal system: Secondary | ICD-10-CM

## 2016-04-09 DIAGNOSIS — M6281 Muscle weakness (generalized): Secondary | ICD-10-CM

## 2016-04-09 DIAGNOSIS — R2681 Unsteadiness on feet: Secondary | ICD-10-CM

## 2016-04-09 NOTE — Therapy (Signed)
[place], [place], [address] Phone: [phone]   Fax:  [phone]  Occupational Therapy Treatment  Patient Details  Name: Jacob Hunter MRN: [id number] Date of Birth: April 30, 1956 Referring Provider: Dr. Asencion Noble  Encounter Date: 04/09/2016      OT End of Session - 04/09/16 1623    Visit Number 7   Number of Visits 16   Date for OT Re-Evaluation 04/29/16  mini reassess: 04/15/16   Authorization Type [employer]   Authorization Time Period Visit limit 60 - 0 used   Authorization - Visit Number 7   Authorization - Number of Visits 15   OT Start Time 6962   OT Stop Time 1515   OT Time Calculation (min) 43 min   Activity Tolerance Patient tolerated treatment well   Behavior During Therapy [facility] for tasks assessed/performed      Past Medical History:  Diagnosis Date  . Arthritis   . Hypercholesteremia   . Knee pain   . Necrotizing myopathy   . Polymyositis [facility])     Past Surgical History:  Procedure Laterality Date  . ANKLE SURGERY Left   . KNEE SURGERY Left     There were no vitals filed for this visit.      Subjective Assessment - 04/09/16 1621    Subjective  S: I used a two pound weight at home for those circles.    Currently in Pain? No/denies                      OT Treatments/Exercises (OP) - 04/09/16 1438      Exercises   Exercises Shoulder;Hand     Shoulder Exercises: Supine   Protraction 12 reps  holding green 65cm therapy ball   Horizontal ABduction 12 reps  holding green 65cm therapy ball   Flexion 12 reps  holding green 65cm therapy ball   Other Supine Exercises diagonal wood chops, circles both directions; 12X; hold green therapy ball 65 cm   Other Supine Exercises x to v arms, A/ROM, 12X     Shoulder Exercises: Seated   Protraction AROM;12 reps  holding green therapy ball 65cm   Flexion AROM;12 reps  holding green 65cm therapy ball   Other  Seated Exercises Diagonal wood chops both sides, 12X     Neurological Re-education Exercises   Trunk Core Activation Seated twist alernating elbow to knee crush, 12X                  OT Short Term Goals - 03/19/16 1437      OT SHORT TERM GOAL #1   Title Patient will be educated and independent with HEP to increase functional performance during daily tasks.    Time 4   Period Weeks   Status On-going     OT SHORT TERM GOAL #2   Title Patient will increase BUE shoulder and elbow strength to 4/5 to increase ability to complete lightweight lifting activities.   Time 4   Period Weeks   Status On-going     OT SHORT TERM GOAL #3   Title Patient will increase overal activity tolerance by completing a simple household task for 10-15 minutes without increased fatigue.    Time 4   Period Weeks   Status On-going     OT SHORT TERM GOAL #4   Title Patient will increase overall core strength to increase ability to complete sit to stands with less difficulty.  Time 4   Period Weeks   Status On-going           OT Long Term Goals - 03/19/16 1437      OT LONG TERM GOAL #1   Title Pt will return to prior level of independence and functioning during daily and work tasks.    Time 8   Period Weeks   Status On-going     OT LONG TERM GOAL #2   Title Patient will increase BUE shoulder and elbow strength to 4+/5 or greater to increase ability to complete work related tasks as well as be able to get up from off the floor as needed.   Time 8   Period Weeks   Status On-going     OT LONG TERM GOAL #3   Title Patient will increase overal activity tolerance by completing a simple household task for 30-35 minutes without increased fatigue.    Time 8   Period Weeks   Status On-going     OT LONG TERM GOAL #4   Title Patient will increase BUE grip strength to within average norms for age in order to be able to open tight lids or containers with less difficulty.    Time 8   Period  Weeks   Status On-going               Plan - 04/09/16 1623    Clinical Impression Statement A: Continued with BUE and core strengthening. Pt able to complete with slightly greater ease today, pt notes during seated twist he is now able to touch his knees with elbow which he was previously unable to do. Pt required frequent rest breaks to monitor exertion, flexion and wood chop exercises continue to be difficult.    Rehab Potential Excellent   OT Frequency 2x / week   OT Duration 8 weeks   OT Treatment/Interventions Self-care/ADL training;Electrical Stimulation;Therapeutic activities;DME and/or AE instruction;Energy conservation;Therapeutic exercises;Neuromuscular education;Patient/family education   Plan P: Continue with UB and core strengthening, progressing as tolerated while keeping within 50-60% max exertion.    Consulted and Agree with Plan of Care Patient      Patient will benefit from skilled therapeutic intervention in order to improve the following deficits and impairments:  Decreased endurance, Decreased activity tolerance, Decreased strength  Visit Diagnosis: Other symptoms and signs involving the musculoskeletal system    Problem List Patient Active Problem List   Diagnosis Date Noted  . Bilateral leg weakness 01/24/2015  . Elevated CPK 01/24/2015  . Transaminitis 10/24/2014    Guadelupe Sabin, OTR/L  [phone] 04/09/2016, 4:28 PM  [place] [address] [place], [place], [address] Phone: [phone]   Fax:  [phone]  Name: Jacob Hunter MRN: [id number] Date of Birth: 03-20-1956

## 2016-04-09 NOTE — Therapy (Signed)
[place], [place], [address] Phone: [phone]   Fax:  [phone]  Physical Therapy Treatment  Patient Details  Name: Jacob Hunter MRN: [id number] Date of Birth: 1956-05-26 Referring Provider: Asencion Noble   Encounter Date: 04/09/2016      PT End of Session - 04/09/16 1607    Visit Number 7   Number of Visits 16   Date for PT Re-Evaluation 04/15/16   Authorization Type [employer] (60 visits between PT/OT/speech)   Authorization Time Period 03/18/16 to 05/18/16   PT Start Time 1519   PT Stop Time 1558   PT Time Calculation (min) 39 min   Equipment Utilized During Treatment Gait belt   Activity Tolerance Patient tolerated treatment well;Patient limited by fatigue   Behavior During Therapy [facility] for tasks assessed/performed      Past Medical History:  Diagnosis Date  . Arthritis   . Hypercholesteremia   . Knee pain   . Necrotizing myopathy   . Polymyositis [facility])     Past Surgical History:  Procedure Laterality Date  . ANKLE SURGERY Left   . KNEE SURGERY Left     There were no vitals filed for this visit.      Subjective Assessment - 04/09/16 1518    Subjective Patient arrives reporting he had a good weekend, has been going up and down inclines. In terms of fatigue his upper body is tired after working with OT, lower body is ready to go.    Pertinent History history of L knee arthroscopic surgery, history of L ankle surgery, no other signifcant PMH    Currently in Pain? No/denies                         [facility] Adult PT Treatment/Exercise - 04/09/16 1526      Ambulation/Gait   Ambulation/Gait Yes   Ambulation/Gait Assistance 5: Supervision   Ambulation Distance (Feet) --  446f, 2244f   Assistive device Rolling walker   Ambulation Surface Level;Indoor   Gait Comments fatigue noted, patient reported exertion 70-80% end of walk      Therapeutic Activites    Therapeutic Activities Other Therapeutic Activities   Other Therapeutic Activities seated on dynadisc and standing on solid surface cross midline reaches with boom wahckers, min guard      Knee/Hip Exercises: Standing   Heel Raises Both;10 reps   Heel Raises Limitations heel and toe    Forward Lunges Both;1 set;5 reps   Forward Lunges Limitations 6 inch box    Forward Step Up Both;1 set;5 reps   Forward Step Up Limitations 4 inch box                 PT Education - 04/09/16 1607    Education provided No          PT Short Term Goals - 03/18/16 1533      PT SHORT TERM GOAL #1   Title Patient to be independent in all bed mobility in order to improve function and QOL at home    Time 4   Period Weeks   Status New     PT SHORT TERM GOAL #2   Title Patient to be able to tolerate at least 5 minutes of unsupported standing with RPE no more than 5/10 in order to demonstrate improved functional activity tolerance and balance    Time 4   Period Weeks  Status New     PT SHORT TERM GOAL #3   Title Patient to be able to ambulate at least 478f with LRAD and Mod(I), RPE no more than 5/10 in order to demonstrate improved mobility and QOL    Time 4   Period Weeks   Status New     PT SHORT TERM GOAL #4   Title Patient to be able to perform stand-pivot transfers with LRAD on a Mod(I) basis in order to improve QOL and function at home    Time 4   Period Weeks   Status New     PT SHORT TERM GOAL #5   Title Patient to be indepedent in correctly and consistently performing appropriate HEP, to be updated PRN   Time 4   Period Weeks   Status New           PT Long Term Goals - 03/18/16 1537      PT LONG TERM GOAL #1   Title Patient to demonstrate proximal muscle strength at least 4/5 in order to assist in improving balance and tolerance to closed chain activities such as gait    Time 8   Period Weeks   Status New     PT LONG TERM GOAL #2   Title Patient to be able to  tolerate 15 minutes of unsupported standing in order to demonstrate improved functional activity tolernace and strength, assist in self care based activities    Time 8   Period Weeks   Status New     PT LONG TERM GOAL #3   Title Patient to be able to ambulate at least 10015fwith LRAD, RPE no more than 5/10, with Mod(I) in order to improve general mobility and allow patient to access community    Time 8   Period Weeks   Status New     PT LONG TERM GOAL #4   Title Patient to be able to hold unsupported tandem stance at least 60 seconds B and SLS at least 30 seconds B in order to demonstrate improved balance and reduced fall risk    Time 8   Period Weeks   Status New     PT LONG TERM GOAL #5   Title Patient to be educated on benefits of and participatory in water exercise program at least 2 times per week in order to maintain functional gains and promote regular activity within safe and well tolerated environment   Time 8 [place] 04/09/16 1608    Clinical Impression Statement Continued focus on gait, functional strengthening, and coordination based activities with focus on form and with rest breaks provided PRN at this time. Patient able to improve gait distance today, with one round of [id number] walker however significant fatigue noted with new activities during session. Patient reported for the most part 60-70% maximal exertion and rest breaks provided as needed to maintain exertion within prescribed percentages.    Rehab Potential Good   Clinical Impairments Affecting Rehab Potential chronic neuromuscular disease    PT Frequency 2x / week   PT Duration 8 weeks   PT Treatment/Interventions ADLs/Self Care Home Management;Biofeedback;Cryotherapy;Moist Heat;DME Instruction;Gait training;Stair training;Functional mobility training;Therapeutic activities;Therapeutic exercise;Balance training;Neuromuscular re-education;Patient/family  education;Manual techniques;Energy conservation;Taping   PT Next Visit Plan DO NOT PUSH PAST 50-60% EXERTION PER MD; continue standing exercises. Trial quadruped, work seated on air pad. Continue  hip walks on table.    PT Home Exercise Plan staying with HHPT/inpatient program for now, do not update yet    Consulted and Agree with Plan of Care Patient      Patient will benefit from skilled therapeutic intervention in order to improve the following deficits and impairments:  Abnormal gait, Improper body mechanics, Cardiopulmonary status limiting activity, Decreased coordination, Decreased mobility, Postural dysfunction, Decreased activity tolerance, Decreased endurance, Decreased strength, Decreased balance, Difficulty walking, Impaired flexibility  Visit Diagnosis: Other symptoms and signs involving the musculoskeletal system  Difficulty in walking, not elsewhere classified  Unsteadiness on feet  Muscle weakness (generalized)     Problem List Patient Active Problem List   Diagnosis Date Noted  . Bilateral leg weakness 01/24/2015  . Elevated CPK 01/24/2015  . Transaminitis 10/24/2014    Deniece Ree PT, DPT [place] [address] [place], [place], [address] Phone: [phone]   Fax:  [phone]  Name: ANN GROENEVELD MRN: [id number] Date of Birth: Aug 10, 1956

## 2016-04-11 ENCOUNTER — Telehealth (HOSPITAL_COMMUNITY): Payer: Self-pay

## 2016-04-11 ENCOUNTER — Ambulatory Visit (HOSPITAL_COMMUNITY): Payer: 59 | Admitting: Occupational Therapy

## 2016-04-11 ENCOUNTER — Ambulatory Visit (HOSPITAL_COMMUNITY): Payer: 59 | Admitting: Physical Therapy

## 2016-04-11 NOTE — Telephone Encounter (Signed)
Pt cx - said he wasn't feeling well today

## 2016-04-16 ENCOUNTER — Encounter (HOSPITAL_COMMUNITY): Payer: Self-pay | Admitting: Occupational Therapy

## 2016-04-16 ENCOUNTER — Ambulatory Visit (HOSPITAL_COMMUNITY): Payer: 59 | Admitting: Occupational Therapy

## 2016-04-16 ENCOUNTER — Ambulatory Visit (HOSPITAL_COMMUNITY): Payer: 59 | Attending: Internal Medicine | Admitting: Physical Therapy

## 2016-04-16 DIAGNOSIS — M6281 Muscle weakness (generalized): Secondary | ICD-10-CM | POA: Insufficient documentation

## 2016-04-16 DIAGNOSIS — R29898 Other symptoms and signs involving the musculoskeletal system: Secondary | ICD-10-CM | POA: Insufficient documentation

## 2016-04-16 DIAGNOSIS — R2681 Unsteadiness on feet: Secondary | ICD-10-CM | POA: Diagnosis present

## 2016-04-16 DIAGNOSIS — R262 Difficulty in walking, not elsewhere classified: Secondary | ICD-10-CM | POA: Diagnosis present

## 2016-04-16 NOTE — Therapy (Signed)
[place], [place], [address] Phone: [phone]   Fax:  [phone]  Physical Therapy Treatment  Patient Details  Name: Jacob Hunter MRN: [id number] Date of Birth: Dec 07, 1955 Referring Provider: Asencion Noble   Encounter Date: 04/16/2016      PT End of Session - 04/16/16 1806    Visit Number 8   Number of Visits 16   Date for PT Re-Evaluation 05/14/16   Authorization Type [employer] (60 visits between PT/OT/speech)   Authorization Time Period 03/18/16 to 05/18/16   PT Start Time 1517   PT Stop Time 1550  ended early due to fatigue    PT Time Calculation (min) 33 min   Equipment Utilized During Treatment Gait belt   Activity Tolerance Patient tolerated treatment well;Patient limited by fatigue   Behavior During Therapy [facility] for tasks assessed/performed      Past Medical History:  Diagnosis Date  . Arthritis   . Hypercholesteremia   . Knee pain   . Necrotizing myopathy   . Polymyositis [facility])     Past Surgical History:  Procedure Laterality Date  . ANKLE SURGERY Left   . KNEE SURGERY Left     There were no vitals filed for this visit.      Subjective Assessment - 04/16/16 1519    Subjective Patient arrives reporting he is doing OK; his enzymes were 3500, they are now 2000 so his MD is happy with that, he is also being schedule for 4 IVs of Retuxin this month.  Nothing else major going on. He rates himself as being 75/100. He called out last session due to being sick, not due to anything we did at PT.    Pertinent History history of L knee arthroscopic surgery, history of L ankle surgery, no other signifcant PMH    How long can you stand comfortably? 9/5- unlimited as long as he is not exercising    How long can you walk comfortably? 9/5- 452 continuously with walker    Patient Stated Goals improve mobilty, get stronger    Currently in Pain? No/denies            [facility] PT  Assessment - 04/16/16 1527      Strength   Right Hip Flexion 2/5   Right Hip Extension 2/5   Right Hip ABduction 2/5   Left Hip Flexion 2/5   Left Hip Extension 2/5   Left Hip ABduction 2/5   Right Knee Flexion 4-/5   Right Knee Extension 4+/5   Left Knee Flexion 3+/5   Left Knee Extension 4/5   Right Ankle Dorsiflexion 5/5   Left Ankle Dorsiflexion 5/5     High Level Balance   High Level Balance Comments tandem stance 13-35 seconds; SLS 12-14 seconds best                      [facility] Adult PT Treatment/Exercise - 04/16/16 1527      Ambulation/Gait   Ambulation/Gait Yes   Ambulation/Gait Assistance 4: Min guard   Ambulation Distance (Feet) 90 Feet   Assistive device None   Ambulation Surface Level;Indoor   Gait Comments 267f with walker, 4575fwith walker best   see assessment portion of note for gait with no device                 PT Education - 04/16/16 1806    Education provided Yes   Education Details  progress wtih skilled PT services, POC moving forward    Person(s) Educated Patient   Methods Explanation   Comprehension Verbalized understanding          PT Short Term Goals - 04/16/16 1540      PT SHORT TERM GOAL #1   Title Patient to be independent in all bed mobility in order to improve function and QOL at home    Baseline 9/5- reports he has to do bed mobilty on his own at home    Time 4   Period Weeks   Status On-going     PT SHORT TERM GOAL #2   Title Patient to be able to tolerate at least 5 minutes of unsupported standing with RPE no more than 5/10 in order to demonstrate improved functional activity tolerance and balance    Baseline 9/5- reports able to perform ADLS unsupported at home   Time 4   Period Weeks   Status Partially Met     PT [place] #3   Title Patient to be able to ambulate at least 469f with LRAD and Mod(I), RPE no more than 5/10 in order to demonstrate improved mobility and QOL    Baseline 9/5- 452  with walker, high fatigue    Time 4   Period Weeks   Status Partially Met     PT SHORT TERM GOAL #4   Title Patient to be able to perform stand-pivot transfers with LRAD on a Mod(I) basis in order to improve QOL and function at home    Period Weeks   Status Achieved     PT SHORT TERM GOAL #5   Title Patient to be indepedent in correctly and consistently performing appropriate HEP, to be updated PRN   Baseline 9/5- reports compliance    Time 4   Period Weeks   Status Achieved           PT Long Term Goals - 04/16/16 1542      PT LONG TERM GOAL #1   Title Patient to demonstrate proximal muscle strength at least 4/5 in order to assist in improving balance and tolerance to closed chain activities such as gait    Time 8   Period Weeks   Status On-going     PT LONG TERM GOAL #2   Title Patient to be able to tolerate 15 minutes of unsupported standing in order to demonstrate improved functional activity tolernace and strength, assist in self care based activities    Time 8   Period Weeks   Status On-going     PT LONG TERM GOAL #3   Title Patient to be able to ambulate at least 10081fwith LRAD, RPE no more than 5/10, with Mod(I) in order to improve general mobility and allow patient to access community    Time 8   Period Weeks   Status On-going     PT LONG TERM GOAL #4   Title Patient to be able to hold unsupported tandem stance at least 60 seconds B and SLS at least 30 seconds B in order to demonstrate improved balance and reduced fall risk    Time 8   Period Weeks   Status On-going     PT LONG TERM GOAL #5   Title Patient to be educated on benefits of and participatory in water exercise program at least 2 times per week in order to maintain functional gains and promote regular activity within safe and well tolerated environment   Time  8   Period Weeks   Status On-going               Plan - 04/16/16 1807    Clinical Impression Statement Re-assessment performed  today. Patient is making good progress with skilled PT services, showing objective improvements in all tested areas and subjectively also reports improving function at home. He does, however continue to be limited severely by fatigue, and also continues to display significant functional weakness, unsteadiness, and reduced functional activity tolerance that makes him a large fall risk at this time. Did trial gait with no device today, and patient was able to ambulate approximately 16f before experiencing a sudden buckling of bilateral knees due to fatigue; DPT present and immediately able to catch/stabilize patient and prevented him from falling completely to the floor. DPT able to keep patient up while other rehabilitation staff arrived to assist and [facility] or complete [facility] occurred today. Otherwise ended session early today due to fatigue. At this point recommend continuation of skilled PT services in order to continue addressing functional limitations, attempt to reach optimal level of function, and reduce overall fall risk.    Rehab Potential Good   Clinical Impairments Affecting Rehab Potential chronic neuromuscular disease    PT Frequency 2x / week   PT Duration 4 weeks   PT Treatment/Interventions ADLs/Self Care Home Management;Biofeedback;Cryotherapy;Moist Heat;DME Instruction;Gait training;Stair training;Functional mobility training;Therapeutic activities;Therapeutic exercise;Balance training;Neuromuscular re-education;Patient/family education;Manual techniques;Energy conservation;Taping   PT Next Visit Plan DO NOT PUSH PAST 50-60% EXERTION PER MD; continue standing exercises. Continue proximal and CKC strengthening. Balance. May perform gait with no device for limited distances (monitor closely/close guarding) due to fatigue/safety concerns.    PT Home Exercise Plan staying with HHPT/inpatient program for now, do not update yet    Consulted and Agree with  Plan of Care Patient      Patient will benefit from skilled therapeutic intervention in order to improve the following deficits and impairments:  Abnormal gait, Improper body mechanics, Cardiopulmonary status limiting activity, Decreased coordination, Decreased mobility, Postural dysfunction, Decreased activity tolerance, Decreased endurance, Decreased strength, Decreased balance, Difficulty walking, Impaired flexibility  Visit Diagnosis: Difficulty in walking, not elsewhere classified  Unsteadiness on feet  Muscle weakness (generalized)  Other symptoms and signs involving the musculoskeletal system     Problem List Patient Active Problem List   Diagnosis Date Noted  . Bilateral leg weakness 01/24/2015  . Elevated CPK 01/24/2015  . Transaminitis 10/24/2014    KDeniece ReePT, DPT [address] [address]Phone: [phone]  Fax:  [phone] Name: VBAYLON SANTELLIMRN: [id number] of Birth: [birth year]

## 2016-04-16 NOTE — Therapy (Signed)
[place] [address] [place], [place], [address] Phone: [phone]   Fax:  [phone]  Occupational Therapy Treatment  Patient Details  Name: Jacob Hunter MRN: [id number] Date of Birth: 1956/02/26 Referring Provider: Dr. Asencion Noble  Encounter Date: 04/16/2016      OT End of Session - 04/16/16 1432    Visit Number 8   Number of Visits 16   Date for OT Re-Evaluation 04/29/16  mini reassess: 04/15/16   Authorization Type [employer]   Authorization Time Period Visit limit 60 - 0 used   Authorization - Visit Number 8   Authorization - Number of Visits 68   OT Start Time 1302   OT Stop Time 1343   OT Time Calculation (min) 41 min   Activity Tolerance Patient tolerated treatment well   Behavior During Therapy [facility] for tasks assessed/performed      Past Medical History:  Diagnosis Date  . Arthritis   . Hypercholesteremia   . Knee pain   . Necrotizing myopathy   . Polymyositis [facility])     Past Surgical History:  Procedure Laterality Date  . ANKLE SURGERY Left   . KNEE SURGERY Left     There were no vitals filed for this visit.      Subjective Assessment - 04/16/16 1306    Subjective  S: I was sick all day Thursday.    Currently in Pain? No/denies            [facility] OT Assessment - 04/16/16 1306      Assessment   Diagnosis BUE weakness     Precautions   Precautions Other (comment)   Precaution Comments CAN ONLY GO TO 50-60% OF MAX RIGHT NOW                   OT Treatments/Exercises (OP) - 04/16/16 1307      Exercises   Exercises Shoulder;Hand     Shoulder Exercises: Supine   Protraction 12 reps  holding green 65cm therapy ball   Horizontal ABduction 12 reps  holding green 65cm therapy ball   Flexion 12 reps  holding green 65cm therapy ball   ABduction AROM;12 reps   Other Supine Exercises serratus anterior punch with green 65cm therapy ball, 12X;     Other Supine Exercises x to v  arms, A/ROM, 12X     Shoulder Exercises: Seated   Protraction AROM;12 reps  holding green therapy ball 65cm   Flexion AROM;12 reps  holding green therapy ball 65cm   Other Seated Exercises Diagonal wood chops both sides, 12X; circles overhead both directions 10X     Shoulder Exercises: ROM/Strengthening   X to V Arms 12X   Proximal Shoulder Strengthening, Supine 12X no rest breaks     Neurological Re-education Exercises   Trunk Exercises Core Activation   Trunk Core Activation Seated twist alernating elbow to knee crush, 12X; supine abdominal twist with knees bent, 3# weight, 12X                  OT Short Term Goals - 03/19/16 1437      OT SHORT TERM GOAL #1   Title Patient will be educated and independent with HEP to increase functional performance during daily tasks.    Time 4   Period Weeks   Status On-going     OT SHORT TERM GOAL #2   Title Patient will increase BUE shoulder and elbow strength to 4/5 to increase  ability to complete lightweight lifting activities.   Time 4   Period Weeks   Status On-going     OT SHORT TERM GOAL #3   Title Patient will increase overal activity tolerance by completing a simple household task for 10-15 minutes without increased fatigue.    Time 4   Period Weeks   Status On-going     OT SHORT TERM GOAL #4   Title Patient will increase overall core strength to increase ability to complete sit to stands with less difficulty.     Time 4   Period Weeks   Status On-going           OT Long Term Goals - 03/19/16 1437      OT LONG TERM GOAL #1   Title Pt will return to prior level of independence and functioning during daily and work tasks.    Time 8   Period Weeks   Status On-going     OT LONG TERM GOAL #2   Title Patient will increase BUE shoulder and elbow strength to 4+/5 or greater to increase ability to complete work related tasks as well as be able to get up from off the floor as needed.   Time 8   Period Weeks    Status On-going     OT LONG TERM GOAL #3   Title Patient will increase overal activity tolerance by completing a simple household task for 30-35 minutes without increased fatigue.    Time 8   Period Weeks   Status On-going     OT LONG TERM GOAL #4   Title Patient will increase BUE grip strength to within average norms for age in order to be able to open tight lids or containers with less difficulty.    Time 8   Period Weeks   Status On-going               Plan - 04/16/16 1432    Clinical Impression Statement A: Continued with BUE and core strengthening. Resumed supine abdominal twist, added serratus anterior punch. Pt completing more repetitions prior to requiring rest breaks, keeping with 50-60% exertion precautions. Pt completed seated exercises first to limit exertion during completion, pt reports this worked well for him.    Plan  P: Mini reassessment; Continue with UB and core strengthening, progressing as tolerated while keeping within 50-60% max exertion.      Patient will benefit from skilled therapeutic intervention in order to improve the following deficits and impairments:  Decreased endurance, Decreased activity tolerance, Decreased strength  Visit Diagnosis: Other symptoms and signs involving the musculoskeletal system    Problem List Patient Active Problem List   Diagnosis Date Noted  . Bilateral leg weakness 01/24/2015  . Elevated CPK 01/24/2015  . Transaminitis 10/24/2014   Guadelupe Sabin, OTR/L  [phone] 04/16/2016, 2:36 PM  [place] [address] [place], [place], [address] Phone: [phone]   Fax:  [phone]  Name: Jacob Hunter MRN: [id number] Date of Birth: Dec 14, 1955

## 2016-04-18 ENCOUNTER — Ambulatory Visit (HOSPITAL_COMMUNITY): Payer: 59

## 2016-04-18 DIAGNOSIS — R2681 Unsteadiness on feet: Secondary | ICD-10-CM

## 2016-04-18 DIAGNOSIS — M6281 Muscle weakness (generalized): Secondary | ICD-10-CM

## 2016-04-18 DIAGNOSIS — R262 Difficulty in walking, not elsewhere classified: Secondary | ICD-10-CM | POA: Diagnosis not present

## 2016-04-18 DIAGNOSIS — R29898 Other symptoms and signs involving the musculoskeletal system: Secondary | ICD-10-CM

## 2016-04-18 NOTE — Therapy (Deleted)
[place], [place], [address] Phone: [phone]   Fax:  [phone]  Physical Therapy Treatment  Patient Details  Name: Jacob Hunter MRN: [id number] Date of Birth: 1956-05-22 Referring Provider: Asencion Noble   Encounter Date: 04/18/2016      PT End of Session - 04/18/16 1405    Visit Number 9   Number of Visits 16   Date for PT Re-Evaluation 05/14/16   Authorization Type [employer] (60 visits between PT/OT/speech)   Authorization Time Period 03/18/16 to 05/18/16   PT Start Time 1350   PT Stop Time 1428   PT Time Calculation (min) 38 min   Equipment Utilized During Treatment Gait belt   Activity Tolerance Patient tolerated treatment well;Patient limited by fatigue   Behavior During Therapy [facility] for tasks assessed/performed      Past Medical History:  Diagnosis Date  . Arthritis   . Hypercholesteremia   . Knee pain   . Necrotizing myopathy   . Polymyositis [facility])     Past Surgical History:  Procedure Laterality Date  . ANKLE SURGERY Left   . KNEE SURGERY Left     There were no vitals filed for this visit.      Subjective Assessment - 04/18/16 1405    Subjective pt stated he is feeling good today, no reports of pain today.     Pertinent History history of L knee arthroscopic surgery, history of L ankle surgery, no other signifcant PMH    Patient Stated Goals improve mobilty, get stronger    Currently in Pain? No/denies                         [facility] Adult PT Treatment/Exercise - 04/18/16 0001      Ambulation/Gait   Ambulation/Gait Yes   Ambulation/Gait Assistance 4: Min guard   Ambulation Distance (Feet) 132 Feet   Assistive device Other (Comment)  trekking poles   Ambulation Surface Level;Indoor     Knee/Hip Exercises: Standing   SLS 3 reps no UEs, min guard    Other Standing Knee Exercises Tandem stance 3x 30"   Other Standing Knee Exercises NBOS on  airex 3x 30"     Knee/Hip Exercises: Seated   Other Seated Knee/Hip Exercises sitting on dynadisc no UE/LE support with red theraball chest press, shoulder flexion, then rotation   Sit to Sand 3 sets;with UE support  elevated height     Knee/Hip Exercises: Supine   Bridges 2 sets;10 reps                  PT Short Term Goals - 04/16/16 1540      PT SHORT TERM GOAL #1   Title Patient to be independent in all bed mobility in order to improve function and QOL at home    Baseline 9/5- reports he has to do bed mobilty on his own at home    Time 4   Period Weeks   Status On-going     PT SHORT TERM GOAL #2   Title Patient to be able to tolerate at least 5 minutes of unsupported standing with RPE no more than 5/10 in order to demonstrate improved functional activity tolerance and balance    Baseline 9/5- reports able to perform ADLS unsupported at home   Time 4   Period Weeks   Status Partially Met     PT SHORT TERM GOAL #3  Title Patient to be able to ambulate at least 452f with LRAD and Mod(I), RPE no more than 5/10 in order to demonstrate improved mobility and QOL    Baseline 9/5- 452 with walker, high fatigue    Time 4   Period Weeks   Status Partially Met     PT SHORT TERM GOAL #4   Title Patient to be able to perform stand-pivot transfers with LRAD on a Mod(I) basis in order to improve QOL and function at home    Period Weeks   Status Achieved     PT SHORT TERM GOAL #5   Title Patient to be indepedent in correctly and consistently performing appropriate HEP, to be updated PRN   Baseline 9/5- reports compliance    Time 4   Period Weeks   Status Achieved           PT Long Term Goals - 04/16/16 1542      PT LONG TERM GOAL #1   Title Patient to demonstrate proximal muscle strength at least 4/5 in order to assist in improving balance and tolerance to closed chain activities such as gait    Time 8   Period Weeks   Status On-going     PT LONG TERM GOAL #2    Title Patient to be able to tolerate 15 minutes of unsupported standing in order to demonstrate improved functional activity tolernace and strength, assist in self care based activities    Time 8   Period Weeks   Status On-going     PT LONG TERM GOAL #3   Title Patient to be able to ambulate at least 10031fwith LRAD, RPE no more than 5/10, with Mod(I) in order to improve general mobility and allow patient to access community    Time 8   Period Weeks   Status On-going     PT LONG TERM GOAL #4   Title Patient to be able to hold unsupported tandem stance at least 60 seconds B and SLS at least 30 seconds B in order to demonstrate improved balance and reduced fall risk    Time 8   Period Weeks   Status On-going     PT LONG TERM GOAL #5   Title Patient to be educated on benefits of and participatory in water exercise program at least 2 times per week in order to maintain functional gains and promote regular activity within safe and well tolerated environment   Time 8   Period Weeks   Status On-going               Plan - 04/18/16 1527    Clinical Impression Statement Session focus on improving activity tolerance with functional strengthening including gait training with LRAD and balance training with NBOS and dynamic surfaces.  Began gait training wiht trekking poles with min guard with ability to ambulate 13251frior fatigue, cueing to improve stride length and posture to improve mechanics.  Min guard required with balance training, pt continues to demonstrate weak core and proximal musculature with increased difficulty reaching beyond BOS.  Pt did demonstrate knee buckling at EOS requiring assistance for safety.  KriDeniece ReePT assisted with treatment at EOS.  No reoprts of pain.     Rehab Potential Good   Clinical Impairments Affecting Rehab Potential chronic neuromuscular disease    PT Frequency 2x / week   PT Duration 4 weeks   PT Treatment/Interventions ADLs/Self Care  Home Management;Biofeedback;Cryotherapy;Moist Heat;DME Instruction;Gait training;Stair training;Functional mobility training;Therapeutic activities;Therapeutic  exercise;Balance training;Neuromuscular re-education;Patient/family education;Manual techniques;Energy conservation;Taping   PT Next Visit Plan DO NOT PUSH PAST 50-60% EXERTION PER MD; continue standing exercises. Continue proximal and CKC strengthening. Balance. May perform gait with no device for limited distances (monitor closely/close guarding) due to fatigue/safety concerns.       Patient will benefit from skilled therapeutic intervention in order to improve the following deficits and impairments:  Abnormal gait, Improper body mechanics, Cardiopulmonary status limiting activity, Decreased coordination, Decreased mobility, Postural dysfunction, Decreased activity tolerance, Decreased endurance, Decreased strength, Decreased balance, Difficulty walking, Impaired flexibility  Visit Diagnosis: Other symptoms and signs involving the musculoskeletal system  Difficulty in walking, not elsewhere classified  Unsteadiness on feet  Muscle weakness (generalized)     Problem List Patient Active Problem List   Diagnosis Date Noted  . Bilateral leg weakness 01/24/2015  . Elevated CPK 01/24/2015  . Transaminitis 10/24/2014     [place], [place], [address] Phone: [phone]   Fax:  [phone]  Name: Jacob Hunter MRN: [id number] Date of Birth: 1956/03/11

## 2016-04-19 NOTE — Therapy (Signed)
[place], [place], [address] Phone: [phone]   Fax:  [phone]  Physical Therapy Treatment  Patient Details  Name: Jacob Hunter MRN: [id number] Date of Birth: [birth year] Referring Provider: Asencion Noble   Encounter Date: 04/18/2016      PT End of Session - 04/18/16 1405    Visit Number 9   Number of Visits 16   Date for PT Re-Evaluation 05/14/16   Authorization Type [employer] (60 visits between PT/OT/speech)   Authorization Time Period 03/18/16 to 05/18/16   PT Start Time 1350   PT Stop Time 1428   PT Time Calculation (min) 38 min   Equipment Utilized During Treatment Gait belt   Activity Tolerance Patient tolerated treatment well;Patient limited by fatigue   Behavior During Therapy [facility] for tasks assessed/performed      Past Medical History:  Diagnosis Date  . Arthritis   . Hypercholesteremia   . Knee pain   . Necrotizing myopathy   . Polymyositis [facility])     Past Surgical History:  Procedure Laterality Date  . ANKLE SURGERY Left   . KNEE SURGERY Left     There were no vitals filed for this visit.      Subjective Assessment - 04/18/16 1405    Subjective pt stated he is feeling good today, no reports of pain today.     Pertinent History history of L knee arthroscopic surgery, history of L ankle surgery, no other signifcant PMH    Patient Stated Goals improve mobilty, get stronger    Currently in Pain? No/denies           04/19/16 0001  Ambulation/Gait  Ambulation/Gait Yes  Ambulation/Gait Assistance 4: Min guard  Ambulation Distance (Feet) 132 Feet  Assistive device Other (Comment) (trekking poles)  Knee/Hip Exercises: Standing  Other Standing Knee Exercises Tandem stance 3x 30"  Other Standing Knee Exercises NBOS on airex 3x 30"  SLS 3 reps  Knee/Hip Exercises: Seated  Other Seated Knee/Hip Exercises sitting on dynadisc no UE/LE support with red theraball  chest press, shoulder flexion, then rotation  Sit to Sand 3 sets;with UE support (elevated height)  Knee/Hip Exercises: Supine  Bridges 2 sets;10 reps                              PT Short Term Goals - 04/16/16 1540      PT SHORT TERM GOAL #1   Title Patient to be independent in all bed mobility in order to improve function and QOL at home    Baseline 9/5- reports he has to do bed mobilty on his own at home    Time 4   Period Weeks   Status On-going     PT SHORT TERM GOAL #2   Title Patient to be able to tolerate at least 5 minutes of unsupported standing with RPE no more than 5/10 in order to demonstrate improved functional activity tolerance and balance    Baseline 9/5- reports able to perform ADLS unsupported at home   Time 4   Period Weeks   Status Partially Met     PT SHORT TERM GOAL #3   Title Patient to be able to ambulate at least 440f with LRAD and Mod(I), RPE no more than 5/10 in order to demonstrate improved mobility and QOL    Baseline 9/5- 452 with walker, high fatigue    Time  4   Period Weeks   Status Partially Met     PT SHORT TERM GOAL #4   Title Patient to be able to perform stand-pivot transfers with LRAD on a Mod(I) basis in order to improve QOL and function at home    Period Weeks   Status Achieved     PT SHORT TERM GOAL #5   Title Patient to be indepedent in correctly and consistently performing appropriate HEP, to be updated PRN   Baseline 9/5- reports compliance    Time 4   Period Weeks   Status Achieved           PT Long Term Goals - 04/16/16 1542      PT LONG TERM GOAL #1   Title Patient to demonstrate proximal muscle strength at least 4/5 in order to assist in improving balance and tolerance to closed chain activities such as gait    Time 8   Period Weeks   Status On-going     PT LONG TERM GOAL #2   Title Patient to be able to tolerate 15 minutes of unsupported standing in order to demonstrate improved  functional activity tolernace and strength, assist in self care based activities    Time 8   Period Weeks   Status On-going     PT LONG TERM GOAL #3   Title Patient to be able to ambulate at least 1017f with LRAD, RPE no more than 5/10, with Mod(I) in order to improve general mobility and allow patient to access community    Time 8   Period Weeks   Status On-going     PT LONG TERM GOAL #4   Title Patient to be able to hold unsupported tandem stance at least 60 seconds B and SLS at least 30 seconds B in order to demonstrate improved balance and reduced fall risk    Time 8   Period Weeks   Status On-going     PT LONG TERM GOAL #5   Title Patient to be educated on benefits of and participatory in water exercise program at least 2 times per week in order to maintain functional gains and promote regular activity within safe and well tolerated environment   Time 8   Period Weeks   Status On-going               Plan - 04/18/16 1527    Clinical Impression Statement Session focus on improving activity tolerance with functional strengthening including gait training with LRAD and balance training with NBOS and dynamic surfaces.  Began gait training wiht trekking poles with min guard with ability to ambulate 1349fprior fatigue, cueing to improve stride length and posture to improve mechanics.  Min guard required with balance training, pt continues to demonstrate weak core and proximal musculature with increased difficulty reaching beyond BOS.  Pt did demonstrate knee buckling at EOS requiring assistance for safety.  KrDeniece ReeDPT assisted with treatment at EOS.  No reoprts of pain.     Rehab Potential Good   Clinical Impairments Affecting Rehab Potential chronic neuromuscular disease    PT Frequency 2x / week   PT Duration 4 weeks   PT Treatment/Interventions ADLs/Self Care Home Management;Biofeedback;Cryotherapy;Moist Heat;DME Instruction;Gait training;Stair training;Functional  mobility training;Therapeutic activities;Therapeutic exercise;Balance training;Neuromuscular re-education;Patient/family education;Manual techniques;Energy conservation;Taping   PT Next Visit Plan DO NOT PUSH PAST 50-60% EXERTION PER MD; continue standing exercises. Continue proximal and CKC strengthening. Balance. May perform gait with no device for limited distances (monitor closely/close guarding)  due to fatigue/safety concerns.       Patient will benefit from skilled therapeutic intervention in order to improve the following deficits and impairments:  Abnormal gait, Improper body mechanics, Cardiopulmonary status limiting activity, Decreased coordination, Decreased mobility, Postural dysfunction, Decreased activity tolerance, Decreased endurance, Decreased strength, Decreased balance, Difficulty walking, Impaired flexibility  Visit Diagnosis: Other symptoms and signs involving the musculoskeletal system  Difficulty in walking, not elsewhere classified  Unsteadiness on feet  Muscle weakness (generalized)     Problem List Patient Active Problem List   Diagnosis Date Noted  . Bilateral leg weakness 01/24/2015  . Elevated CPK 01/24/2015  . Transaminitis 10/24/2014   Ihor Austin, [place]; [place]  Aldona Lento 04/19/2016, 8:05 AM  [place], [place], [address] Phone: [phone]   Fax:  [phone]  Name: Jacob Hunter MRN: [id number] Date of Birth: October 04, 1955

## 2016-04-23 ENCOUNTER — Ambulatory Visit (HOSPITAL_COMMUNITY): Payer: 59

## 2016-04-23 ENCOUNTER — Encounter (HOSPITAL_COMMUNITY): Payer: Self-pay

## 2016-04-23 DIAGNOSIS — R262 Difficulty in walking, not elsewhere classified: Secondary | ICD-10-CM | POA: Diagnosis not present

## 2016-04-23 DIAGNOSIS — M6281 Muscle weakness (generalized): Secondary | ICD-10-CM

## 2016-04-23 DIAGNOSIS — R29898 Other symptoms and signs involving the musculoskeletal system: Secondary | ICD-10-CM

## 2016-04-23 DIAGNOSIS — R2681 Unsteadiness on feet: Secondary | ICD-10-CM

## 2016-04-23 NOTE — Therapy (Signed)
[place], [place], [address] Phone: [phone]   Fax:  [phone]  Physical Therapy Treatment  Patient Details  Name: Jacob Hunter MRN: [id number] Date of Birth: 1956-05-25 Referring Provider: Asencion Noble   Encounter Date: 04/23/2016      PT End of Session - 04/23/16 0948    Visit Number 10   Number of Visits 16   Date for PT Re-Evaluation 05/14/16   Authorization Type [employer] (60 visits between PT/OT/speech)   Authorization Time Period 03/18/16 to 05/18/16   PT Start Time 0947   PT Stop Time 1028   PT Time Calculation (min) 41 min   Equipment Utilized During Treatment Gait belt   Activity Tolerance Patient tolerated treatment well;Patient limited by fatigue   Behavior During Therapy [facility] for tasks assessed/performed      Past Medical History:  Diagnosis Date  . Arthritis   . Hypercholesteremia   . Knee pain   . Necrotizing myopathy   . Polymyositis [facility])     Past Surgical History:  Procedure Laterality Date  . ANKLE SURGERY Left   . KNEE SURGERY Left     There were no vitals filed for this visit.      Subjective Assessment - 04/23/16 0947    Subjective Pt stated he is feelinggood today, wishes to complete increased standing and walking as well as stairs today.     Pertinent History history of L knee arthroscopic surgery, history of L ankle surgery, no other signifcant PMH    Patient Stated Goals improve mobilty, get stronger    Currently in Pain? No/denies                         [facility] Adult PT Treatment/Exercise - 04/23/16 0001      Ambulation/Gait   Ambulation/Gait Yes   Ambulation/Gait Assistance 4: Min guard   Ambulation Distance (Feet) 226 Feet   Assistive device --  trekking poles   Ambulation Surface Level;Indoor     Knee/Hip Exercises: Standing   Forward Step Up 10 reps;Hand Hold: 2;Step Height: 6"   Stairs 4in reciprocal pattern 2HR  ascend and descend   Other Standing Knee Exercises Tandem stance 3x 30" with cone rotation     Knee/Hip Exercises: Seated   Other Seated Knee/Hip Exercises catch and throw volley ball sitting on dynadisc all planes to improve reaching outside BOS   Sit to Sand 5 reps;without UE support  elevated height                  PT Short Term Goals - 04/16/16 1540      PT SHORT TERM GOAL #1   Title Patient to be independent in all bed mobility in order to improve function and QOL at home    Baseline 9/5- reports he has to do bed mobilty on his own at home    Time 4   Period Weeks   Status On-going     PT SHORT TERM GOAL #2   Title Patient to be able to tolerate at least 5 minutes of unsupported standing with RPE no more than 5/10 in order to demonstrate improved functional activity tolerance and balance    Baseline 9/5- reports able to perform ADLS unsupported at home   Time 4   Period Weeks   Status Partially Met     PT SHORT TERM GOAL #3   Title Patient to be  able to ambulate at least 438f with LRAD and Mod(I), RPE no more than 5/10 in order to demonstrate improved mobility and QOL    Baseline 9/5- 452 with walker, high fatigue    Time 4   Period Weeks   Status Partially Met     PT SHORT TERM GOAL #4   Title Patient to be able to perform stand-pivot transfers with LRAD on a Mod(I) basis in order to improve QOL and function at home    Period Weeks   Status Achieved     PT SHORT TERM GOAL #5   Title Patient to be indepedent in correctly and consistently performing appropriate HEP, to be updated PRN   Baseline 9/5- reports compliance    Time 4   Period Weeks   Status Achieved           PT Long Term Goals - 04/16/16 1542      PT LONG TERM GOAL #1   Title Patient to demonstrate proximal muscle strength at least 4/5 in order to assist in improving balance and tolerance to closed chain activities such as gait    Time 8   Period Weeks   Status On-going     PT LONG  TERM GOAL #2   Title Patient to be able to tolerate 15 minutes of unsupported standing in order to demonstrate improved functional activity tolernace and strength, assist in self care based activities    Time 8   Period Weeks   Status On-going     PT LONG TERM GOAL #3   Title Patient to be able to ambulate at least 10027fwith LRAD, RPE no more than 5/10, with Mod(I) in order to improve general mobility and allow patient to access community    Time 8   Period Weeks   Status On-going     PT LONG TERM GOAL #4   Title Patient to be able to hold unsupported tandem stance at least 60 seconds B and SLS at least 30 seconds B in order to demonstrate improved balance and reduced fall risk    Time 8   Period Weeks   Status On-going     PT LONG TERM GOAL #5   Title Patient to be educated on benefits of and participatory in water exercise program at least 2 times per week in order to maintain functional gains and promote regular activity within safe and well tolerated environment   Time 8   Period Weeks   Status On-going               Plan - 04/23/16 1207    Clinical Impression Statement Continued session focus on improving activity tolerance with functional strengthening and balance training.  Pt improving activity tolerance noted with ability to ambulate increased distance with trekking poles with only standing rest breaks required.  Began stair training with reciprocal pattern 4in wtih min guard for safety, increased difficulty with increased step height due to weakness, was able to complete 2 steps only on 7in step height due to fatigue.  No reports of pain through session.  Average fatigue level range from 50-60% through session.   Rehab Potential Good   Clinical Impairments Affecting Rehab Potential chronic neuromuscular disease    PT Frequency 2x / week   PT Duration 4 weeks   PT Treatment/Interventions ADLs/Self Care Home Management;Biofeedback;Cryotherapy;Moist Heat;DME  Instruction;Gait training;Stair training;Functional mobility training;Therapeutic activities;Therapeutic exercise;Balance training;Neuromuscular re-education;Patient/family education;Manual techniques;Energy conservation;Taping   PT Next Visit Plan DO NOT PUSH PAST 50-60%  EXERTION PER MD; continue standing exercises. Continue proximal and CKC strengthening. Balance. May perform gait with no device for limited distances (monitor closely/close guarding) due to fatigue/safety concerns.       Patient will benefit from skilled therapeutic intervention in order to improve the following deficits and impairments:  Abnormal gait, Improper body mechanics, Cardiopulmonary status limiting activity, Decreased coordination, Decreased mobility, Postural dysfunction, Decreased activity tolerance, Decreased endurance, Decreased strength, Decreased balance, Difficulty walking, Impaired flexibility  Visit Diagnosis: Other symptoms and signs involving the musculoskeletal system  Difficulty in walking, not elsewhere classified  Unsteadiness on feet  Muscle weakness (generalized)     Problem List Patient Active Problem List   Diagnosis Date Noted  . Bilateral leg weakness 01/24/2015  . Elevated CPK 01/24/2015  . Transaminitis 10/24/2014   Ihor [place], [place]; [place]  Aldona Lento 04/23/2016, 12:21 PM  [place], [place], [address] Phone: [phone]   Fax:  [phone]  Name: ANGELL HONSE MRN: [id number] Date of Birth: [birth year]

## 2016-04-23 NOTE — Therapy (Signed)
[place], [place], [address] Phone: [phone]   Fax:  [phone]  Occupational Therapy Treatment And reassessment Patient Details  Name: Jacob Hunter MRN: [id number] Date of Birth: 1955/09/03 Referring Provider: Dr. Asencion Noble  Encounter Date: 04/23/2016      OT End of Session - 04/23/16 1210    Visit Number 9   Number of Visits 16   Date for OT Re-Evaluation 05/23/16   Authorization Type [employer]   Authorization Time Period Visit limit 60 - 0 used   Authorization - Visit Number 9   Authorization - Number of Visits 81   OT Start Time 1115  reassessment   OT Stop Time 1200   OT Time Calculation (min) 45 min   Activity Tolerance Patient tolerated treatment well   Behavior During Therapy [facility] for tasks assessed/performed      Past Medical History:  Diagnosis Date  . Arthritis   . Hypercholesteremia   . Knee pain   . Necrotizing myopathy   . Polymyositis [facility])     Past Surgical History:  Procedure Laterality Date  . ANKLE SURGERY Left   . KNEE SURGERY Left     There were no vitals filed for this visit.      Subjective Assessment - 04/23/16 1213    Subjective  S: My balance and UB strength are getting better.    Currently in Pain? No/denies            [facility] OT Assessment - 04/23/16 1120      Assessment   Diagnosis BUE weakness   Onset Date --  Nov. 2015     Precautions   Precautions Other (comment)   Precaution Comments CAN ONLY GO TO 50-60% OF MAX RIGHT NOW      Prior Function   Level of Independence Independent;Independent with basic ADLs;Independent with gait;Independent with transfers     Strength   Strength Assessment Site Shoulder   Right/Left Shoulder Right;Left   Right Shoulder Flexion 4-/5  previous: 3/5   Right Shoulder ABduction 4-/5  previous: 3/5   Right Shoulder Internal Rotation 4-/5  previous: 3/5   Right Shoulder External Rotation 3+/5   previous: 3/5   Right Shoulder Horizontal ABduction 3+/5  previous: 3/5   Right Shoulder Horizontal ADduction 3+/5  previous: 3/5   Left Shoulder Flexion 3+/5  previous: 3/5   Left Shoulder ABduction 3+/5  previous: 3/5   Left Shoulder Internal Rotation 4+/5  previous: 3/5   Left Shoulder External Rotation 3/5  previous: 3/5   Left Shoulder Horizontal ABduction 3+/5  previous: 3/5   Left Shoulder Horizontal ADduction 3+/5  previous: 3/5   Right/Left Elbow Left;Right   Right Elbow Flexion 4-/5  previous: same   Right Elbow Extension 3/5  previous: 5/5   Left Elbow Flexion 3+/5  previous: 3/5   Left Elbow Extension 3+/5  previous: 3/5   Right/Left hand Right;Left   Right Hand Grip (lbs) 83  previous: 75   Left Hand Grip (lbs) 85  previous: 80                  OT Treatments/Exercises (OP) - 04/23/16 1159      Transfers   Sit to Stand 5: Supervision     Exercises   Exercises Shoulder     Shoulder Exercises: Seated   Protraction Strengthening;10 reps   Protraction Weight (lbs) 1   Horizontal ABduction Strengthening;Theraband;10 reps   Horizontal ABduction  Weight (lbs) 1   External Rotation Strengthening;Theraband;10 reps   Theraband Level (Shoulder External Rotation) Level 2 (Red)   External Rotation Weight (lbs) 1   Internal Rotation Strengthening;Theraband;10 reps   Theraband Level (Shoulder Internal Rotation) Level 2 (Red)   Internal Rotation Weight (lbs) 1   Flexion Strengthening;10 reps   Flexion Weight (lbs) 1   Abduction Strengthening;10 reps   ABduction Weight (lbs) 1     Shoulder Exercises: ROM/Strengthening   UBE (Upper Arm Bike) Level 1 2' flexion 2' reverse                OT Education - 04/23/16 1215    Education provided Yes   Education Details Pt was given blue theraputty to use at home for grip strengthening.    Person(s) Educated Patient   Methods Explanation   Comprehension Verbalized understanding          OT  Short Term Goals - 04/23/16 1131      OT SHORT TERM GOAL #1   Title Patient will be educated and independent with HEP to increase functional performance during daily tasks.    Time 4   Period Weeks   Status Achieved     OT SHORT TERM GOAL #2   Title Patient will increase BUE shoulder and elbow strength to 4/5 to increase ability to complete lightweight lifting activities.   Time 4   Period Weeks   Status On-going     OT SHORT TERM GOAL #3   Title Patient will increase overal activity tolerance by completing a simple household task for 10-15 minutes without increased fatigue.    Time 4   Period Weeks   Status Achieved     OT SHORT TERM GOAL #4   Title Patient will increase overall core strength to increase ability to complete sit to stands with less difficulty.     Time 4   Period Weeks   Status Achieved           OT Long Term Goals - 04/23/16 1136      OT LONG TERM GOAL #1   Title Pt will return to prior level of independence and functioning during daily and work tasks.    Time 8   Period Weeks   Status On-going     OT LONG TERM GOAL #2   Title Patient will increase BUE shoulder and elbow strength to 4+/5 or greater to increase ability to complete work related tasks as well as be able to get up from off the floor as needed.   Time 8   Period Weeks   Status On-going     OT LONG TERM GOAL #3   Title Patient will increase overal activity tolerance by completing a simple household task for 30-35 minutes without increased fatigue.    Time 8   Period Weeks   Status On-going     OT LONG TERM GOAL #4   Title Patient will increase BUE grip strength to within average norms for age in order to be able to open tight lids or containers with less difficulty.    Time 8   Period Weeks   Status On-going               Plan - 04/23/16 1216    Clinical Impression Statement A: Reassessment completed this date. Pt has met 3/4 short term goals and is progressing towards  all therapy goals. Patient reports that he feels tha this balance and UB strengthening has been  improving. Pt states that he did make breakfast this morning for the first time. Pt continues to have deficits with UB strength, activity tolerance, grip strength, and core strength.    Plan P: Continue with UB and core strengthening. Add chair push ups.      Patient will benefit from skilled therapeutic intervention in order to improve the following deficits and impairments:  Decreased endurance, Decreased activity tolerance, Decreased strength  Visit Diagnosis: Other symptoms and signs involving the musculoskeletal system - Plan: Ot plan of care cert/re-cert    Problem List Patient Active Problem List   Diagnosis Date Noted  . Bilateral leg weakness 01/24/2015  . Elevated CPK 01/24/2015  . Transaminitis 10/24/2014   Ailene Ravel, OTR/L,CBIS  [phone]  04/23/2016, 12:22 PM  [facility] [address] [place], [place], [address] Phone: [phone]   Fax:  [phone]  Name: Jacob Hunter MRN: [id number] Date of Birth: 1956/04/23

## 2016-04-25 ENCOUNTER — Encounter (HOSPITAL_COMMUNITY): Payer: Self-pay

## 2016-04-25 ENCOUNTER — Ambulatory Visit (HOSPITAL_COMMUNITY): Payer: 59

## 2016-04-25 DIAGNOSIS — R29898 Other symptoms and signs involving the musculoskeletal system: Secondary | ICD-10-CM

## 2016-04-25 DIAGNOSIS — M6281 Muscle weakness (generalized): Secondary | ICD-10-CM

## 2016-04-25 DIAGNOSIS — R262 Difficulty in walking, not elsewhere classified: Secondary | ICD-10-CM | POA: Diagnosis not present

## 2016-04-25 DIAGNOSIS — R2681 Unsteadiness on feet: Secondary | ICD-10-CM

## 2016-04-25 NOTE — Therapy (Signed)
[facility] [address] [place], [place], [address] Phone: [phone]   Fax:  [phone]  Physical Therapy Treatment  Patient Details  Name: Jacob Hunter MRN: [id number] Date of Birth: 1956/04/11 Referring Provider: Asencion Noble   Encounter Date: 04/25/2016      PT End of Session - 04/25/16 1614    Visit Number 11   Number of Visits 16   Date for PT Re-Evaluation 05/14/16   Authorization Type [employer] (60 visits between PT/OT/speech)   Authorization Time Period 03/18/16 to 05/18/16   PT Start Time 2542   PT Stop Time 1717  Pt sat through safety zone report and ice/elevation applied to ankle   PT Time Calculation (min) 71 min   Equipment Utilized During Treatment Gait belt   Activity Tolerance Patient tolerated treatment well;Patient limited by fatigue;Other (comment)   Behavior During Therapy WFL for tasks assessed/performed      Past Medical History:  Diagnosis Date  . Arthritis   . Hypercholesteremia   . Knee pain   . Necrotizing myopathy   . Polymyositis [facility])     Past Surgical History:  Procedure Laterality Date  . ANKLE SURGERY Left   . KNEE SURGERY Left     There were no vitals filed for this visit.      Subjective Assessment - 04/25/16 1607    Subjective Pt stated he is feeling good today, wishes to complete balance, walking and standing exercises today.  Also interested in recovery for floor to standing   Pertinent History history of L knee arthroscopic surgery, history of L ankle surgery, no other signifcant PMH    Patient Stated Goals improve mobilty, get stronger    Currently in Pain? No/denies            [facility] PT Assessment - 04/25/16 0001      Observation/Other Assessments   Observations anterior drawer Rt ankle laxity +     Strength   Right Ankle Dorsiflexion 5/5   Right Ankle Inversion 4+/5   Right Ankle Eversion 4+/5   Left Ankle Dorsiflexion 5/5   Left Ankle  Inversion 5/5   Left Ankle Eversion 5/5                     [facility] Adult PT Treatment/Exercise - 04/25/16 0001      Knee/Hip Exercises: Standing   Forward Lunges Both;10 reps   Forward Lunges Limitations 6 inch box      Knee/Hip Exercises: Seated   Other Seated Knee/Hip Exercises catch and throw volley ball sitting on dynadisc all planes to improve reaching outside BOS     Knee/Hip Exercises: Prone   Hip Extension 5 reps  Quadruped alternating UE ext 5x; LE ER 5x, LE ext 5x   Straight Leg Raises 3 sets;Limitations   Straight Leg Raises Limitations trial coming quadruped to tall kneeling unable                   PT Short Term Goals - 04/16/16 1540      PT SHORT TERM GOAL #1   Title Patient to be independent in all bed mobility in order to improve function and QOL at home    Baseline 9/5- reports he has to do bed mobilty on his own at home    Time 4   Period Weeks   Status On-going     PT SHORT TERM GOAL #2   Title Patient to be  able to tolerate at least 5 minutes of unsupported standing with RPE no more than 5/10 in order to demonstrate improved functional activity tolerance and balance    Baseline 9/5- reports able to perform ADLS unsupported at home   Time 4   Period Weeks   Status Partially Met     PT SHORT TERM GOAL #3   Title Patient to be able to ambulate at least 454f with LRAD and Mod(I), RPE no more than 5/10 in order to demonstrate improved mobility and QOL    Baseline 9/5- 452 with walker, high fatigue    Time 4   Period Weeks   Status Partially Met     PT SHORT TERM GOAL #4   Title Patient to be able to perform stand-pivot transfers with LRAD on a Mod(I) basis in order to improve QOL and function at home    Period Weeks   Status Achieved     PT SHORT TERM GOAL #5   Title Patient to be indepedent in correctly and consistently performing appropriate HEP, to be updated PRN   Baseline 9/5- reports compliance    Time 4   Period Weeks    Status Achieved           PT Long Term Goals - 04/16/16 1542      PT LONG TERM GOAL #1   Title Patient to demonstrate proximal muscle strength at least 4/5 in order to assist in improving balance and tolerance to closed chain activities such as gait    Time 8   Period Weeks   Status On-going     PT LONG TERM GOAL #2   Title Patient to be able to tolerate 15 minutes of unsupported standing in order to demonstrate improved functional activity tolernace and strength, assist in self care based activities    Time 8   Period Weeks   Status On-going     PT LONG TERM GOAL #3   Title Patient to be able to ambulate at least 10085fwith LRAD, RPE no more than 5/10, with Mod(I) in order to improve general mobility and allow patient to access community    Time 8   Period Weeks   Status On-going     PT LONG TERM GOAL #4   Title Patient to be able to hold unsupported tandem stance at least 60 seconds B and SLS at least 30 seconds B in order to demonstrate improved balance and reduced fall risk    Time 8   Period Weeks   Status On-going     PT LONG TERM GOAL #5   Title Patient to be educated on benefits of and participatory in water exercise program at least 2 times per week in order to maintain functional gains and promote regular activity within safe and well tolerated environment   Time 8   Period Weeks   Status On-going               Plan - 04/25/16 1718    Clinical Impression Statement Pt reports he would like to work on standing balance, gait as well as transition from floor to standing initially this session.  Began session with functional strengthening in quadruped activities with trial to tall kneeling to begin progressing floor to standing.  Pt continues to demonstrate significant core and LE weakness with inability to come from quadruped to tall kneeling though core strengthening is improving with increase ease with seated dynamic catch and throw with increased ability  to reach  farther.  Ended session with forward lunges with close supervision and UE A, Rt knee buckled requiring assistance for controlled decent to floor.  Pt reports slight pain Rt ankle following fall.  Evaluation DPT completed assessment Rt ankle.  Safety zone reported and wife informed of incident.  Reviewed importance of self awareness with fatigue levels.  Pt stated fatgiue level remained 50-60% through session.  Encouraged to apply ice with elevation for pain and edema control.     Rehab Potential Good   Clinical Impairments Affecting Rehab Potential chronic neuromuscular disease    PT Frequency 2x / week   PT Duration 4 weeks   PT Treatment/Interventions ADLs/Self Care Home Management;Biofeedback;Cryotherapy;Moist Heat;DME Instruction;Gait training;Stair training;Functional mobility training;Therapeutic activities;Therapeutic exercise;Balance training;Neuromuscular re-education;Patient/family education;Manual techniques;Energy conservation;Taping   PT Next Visit Plan F/u on ankle next session.  Review/modify HEP.  DO NOT PUSH PAST 50-60% EXERTION PER MD; continue standing exercises. Continue proximal and CKC strengthening. Balance. May perform gait with no device for limited distances (monitor closely/close guarding) due to fatigue/safety concerns.       Patient will benefit from skilled therapeutic intervention in order to improve the following deficits and impairments:  Abnormal gait, Improper body mechanics, Cardiopulmonary status limiting activity, Decreased coordination, Decreased mobility, Postural dysfunction, Decreased activity tolerance, Decreased endurance, Decreased strength, Decreased balance, Difficulty walking, Impaired flexibility  Visit Diagnosis: Other symptoms and signs involving the musculoskeletal system  Difficulty in walking, not elsewhere classified  Unsteadiness on feet  Muscle weakness (generalized)     Problem List Patient Active Problem List   Diagnosis  Date Noted  . Bilateral leg weakness 01/24/2015  . Elevated CPK 01/24/2015  . Transaminitis 10/24/2014   Ihor Austin, PTA [phone] Aldona Lento 04/25/2016, 6:37 PM  [facility] [address] [place], [place], [address] Phone: [phone]   Fax:  [phone]  Name: Jacob Hunter MRN: [id number] Date of Birth: 04-12-1956

## 2016-04-25 NOTE — Therapy (Signed)
[place], [place], [address] Phone: [phone]   Fax:  [phone]  Occupational Therapy Treatment  Patient Details  Name: Jacob Hunter MRN: [id number] Date of Birth: [birth year] Referring Provider: Dr. Asencion Noble  Encounter Date: 04/25/2016      OT End of Session - 04/25/16 1756    Visit Number 10   Number of Visits 16   Date for OT Re-Evaluation 05/23/16   Authorization Type [employer]   Authorization Time Period Visit limit 60 - 0 used   Authorization - Visit Number 10   Authorization - Number of Visits 11   OT Start Time 1730   Activity Tolerance Patient tolerated treatment well   Behavior During Therapy [facility] for tasks assessed/performed      Past Medical History:  Diagnosis Date  . Arthritis   . Hypercholesteremia   . Knee pain   . Necrotizing myopathy   . Polymyositis [facility])     Past Surgical History:  Procedure Laterality Date  . ANKLE SURGERY Left   . KNEE SURGERY Left     There were no vitals filed for this visit.      Subjective Assessment - 04/25/16 1737    Subjective  S:    Currently in Pain? No/denies                      OT Treatments/Exercises (OP) - 04/25/16 1737      Transfers   Sit to Stand 5: Supervision     Exercises   Exercises Shoulder     Shoulder Exercises: Seated   Protraction Strengthening;10 reps   Protraction Weight (lbs) 1   Horizontal ABduction Strengthening;Theraband;10 reps   Horizontal ABduction Weight (lbs) 1   External Rotation Strengthening;Theraband;10 reps   Theraband Level (Shoulder External Rotation) Level 2 (Red)   External Rotation Weight (lbs) 1   Internal Rotation Strengthening;Theraband;10 reps   Theraband Level (Shoulder Internal Rotation) Level 2 (Red)   Internal Rotation Weight (lbs) 1   Flexion Strengthening;10 reps   Flexion Weight (lbs) 1   Abduction Strengthening;10 reps   ABduction Weight (lbs) 1   Other Seated Exercises Seated Chair push ups; 10X; 2 sets     Elbow Exercises   Other elbow exercises PNF pattern bicep curl; alternating shoulders'; 15X; 4#     Neurological Re-education Exercises   Bar Weights/Barbell (Elbow Flexion) 4 lbs  15X   Elbow Extension Strengthening;15 reps;Both   Bar Weights/Barbell (Elbow Extension) 4 lbs  15X   Hand Gripper with Large Beads all beads with gripper set at 69# (both)   Hand Gripper with Medium Beads all beads with gripper set at 69# (both)   Hand Gripper with Small Beads all beads with gripper set at 69# (Both)                  OT Short Term Goals - 04/25/16 1807      OT SHORT TERM GOAL #1   Title Patient will be educated and independent with HEP to increase functional performance during daily tasks.    Time 4   Period Weeks     OT SHORT TERM GOAL #2   Title Patient will increase BUE shoulder and elbow strength to 4/5 to increase ability to complete lightweight lifting activities.   Time 4   Period Weeks   Status On-going     OT SHORT TERM GOAL #3   Title Patient will increase overal  activity tolerance by completing a simple household task for 10-15 minutes without increased fatigue.    Time 4   Period Weeks     OT SHORT TERM GOAL #4   Title Patient will increase overall core strength to increase ability to complete sit to stands with less difficulty.     Time 4   Period Weeks   Status Achieved           OT Long Term Goals - 04/23/16 1136      OT LONG TERM GOAL #1   Title Pt will return to prior level of independence and functioning during daily and work tasks.    Time 8   Period Weeks   Status On-going     OT LONG TERM GOAL #2   Title Patient will increase BUE shoulder and elbow strength to 4+/5 or greater to increase ability to complete work related tasks as well as be able to get up from off the floor as needed.   Time 8   Period Weeks   Status On-going     OT LONG TERM GOAL #3   Title Patient will  increase overal activity tolerance by completing a simple household task for 30-35 minutes without increased fatigue.    Time 8   Period Weeks   Status On-going     OT LONG TERM GOAL #4   Title Patient will increase BUE grip strength to within average norms for age in order to be able to open tight lids or containers with less difficulty.    Time 8   Period Weeks   Status On-going               Plan - 04/25/16 1805    Clinical Impression Statement A: Pt fell during PT session prior to OT session. No injuries noted. No pain reported. Added chair push ups this session. Focused on UB strengthening including grip strengthening. 1-2 minute rest breaks taken as needed to reduce fatitgue level.   Plan P: Add core strengthening exercises such as sit ups, abdominal twists, rope climbs.      Patient will benefit from skilled therapeutic intervention in order to improve the following deficits and impairments:  Decreased endurance, Decreased activity tolerance, Decreased strength  Visit Diagnosis: Other symptoms and signs involving the musculoskeletal system    Problem List Patient Active Problem List   Diagnosis Date Noted  . Bilateral leg weakness 01/24/2015  . Elevated CPK 01/24/2015  . Transaminitis 10/24/2014   Ailene Ravel, OTR/L,CBIS  [phone]  04/25/2016, 6:15 PM  [facility] [address] [place], [place], [address] Phone: [phone]   Fax:  [phone]  Name: AMMAR MOFFATT MRN: [id number] Date of Birth: 1956/04/27

## 2016-04-30 ENCOUNTER — Encounter (HOSPITAL_COMMUNITY): Payer: Self-pay

## 2016-04-30 ENCOUNTER — Ambulatory Visit (HOSPITAL_COMMUNITY): Payer: 59

## 2016-04-30 ENCOUNTER — Ambulatory Visit (HOSPITAL_COMMUNITY): Payer: 59 | Admitting: Physical Therapy

## 2016-04-30 DIAGNOSIS — R2681 Unsteadiness on feet: Secondary | ICD-10-CM

## 2016-04-30 DIAGNOSIS — R262 Difficulty in walking, not elsewhere classified: Secondary | ICD-10-CM | POA: Diagnosis not present

## 2016-04-30 DIAGNOSIS — R29898 Other symptoms and signs involving the musculoskeletal system: Secondary | ICD-10-CM

## 2016-04-30 DIAGNOSIS — M6281 Muscle weakness (generalized): Secondary | ICD-10-CM

## 2016-04-30 NOTE — Patient Instructions (Signed)
   BRIDGING  While lying on your back, tighten your lower abdominals, squeeze your buttocks and then raise your buttocks off the floor/bed as creating a "Bridge" with your body. Hold for 1 second  and then lower yourself and repeat 10-15 times, 1-2 times per day.     STANDING HEEL RAISES  While standing, raise up on your toes as you lift your heels off the ground.   Repeat 10 times, twice a day.     TOES RAISES - DORSIFLEXION STANDING  In a standing position with your feet on the ground, raise up your forefoot and toes as you bend at your ankle.  Make sure you are not throwing your hips backwards.  Repeat 10-15 times twice a day.      Seated Hip Abduction with TB  Begin in a seated position with good posture and  both feet on the floor.  Tie an exercise band snugly around the knees, with both knees touching in the middle. Slowly push your knees outward into the band. Hold, then return to the center.    Repeat 10-15 times, twice a day.

## 2016-04-30 NOTE — Therapy (Signed)
[place], [place], [address] Phone: [phone]   Fax:  [phone]  Physical Therapy Treatment  Patient Details  Name: Jacob Hunter MRN: [id number] Date of Birth: Feb 19, 1956 Referring Provider: Asencion Noble   Encounter Date: 04/30/2016      PT End of Session - 04/30/16 1208    Visit Number 12   Number of Visits 16   Date for PT Re-Evaluation 05/14/16   Authorization Type [employer] Plus (60 visits between PT/OT/speech)   Authorization Time Period 03/18/16 to 05/18/16   PT Start Time 1117   PT Stop Time 1159   PT Time Calculation (min) 42 min   Equipment Utilized During Treatment Gait belt   Activity Tolerance Patient tolerated treatment well;Patient limited by fatigue   Behavior During Therapy [facility] for tasks assessed/performed      Past Medical History:  Diagnosis Date  . Arthritis   . Hypercholesteremia   . Knee pain   . Necrotizing myopathy   . Polymyositis [facility])     Past Surgical History:  Procedure Laterality Date  . ANKLE SURGERY Left   . KNEE SURGERY Left     There were no vitals filed for this visit.      Subjective Assessment - 04/30/16 1118    Subjective (P)  Patient arrives reporting that he got an infusion the other day; his next infusion will be next Monday. His ankle did swell up and was painful this weekend but he is feeling pretty good overall.    Pertinent History (P)  history of L knee arthroscopic surgery, history of L ankle surgery, no other signifcant PMH    Currently in Pain? (P)  No/denies                         [facility] Adult PT Treatment/Exercise - 04/30/16 1206      Ambulation/Gait   Ambulation/Gait Yes   Ambulation/Gait Assistance 4: Min guard   Ambulation Distance (Feet) --  262f, 855f   Assistive device Other (Comment)  trekking poles    Gait Pattern Step-through pattern;Decreased stance time - left;Decreased step length -  right;Decreased dorsiflexion - left;Decreased weight shift to left   Gait Comments limited by fatigue      Knee/Hip Exercises: Standing   Heel Raises Both;1 set;10 reps   Heel Raises Limitations heel and toe    Forward Lunges Both;1 set;10 reps   Forward Lunges Limitations 4 inch box, min guard knee block    Forward Step Up Both;1 set;5 reps   Forward Step Up Limitations 2 inch box, min guard on knee block    Other Standing Knee Exercises standing marches 1x5 each side, min guard knee block    Other Standing Knee Exercises standing on foam pad with no UE support, min guard, ball toss                 PT Education - 04/30/16 1208    Education provided Yes   Education Details updated HEP    Person(s) Educated Patient   Methods Explanation;Handout   Comprehension Verbalized understanding          PT Short Term Goals - 04/16/16 1540      PT SHORT TERM GOAL #1   Title Patient to be independent in all bed mobility in order to improve function and QOL at home    Baseline 9/5- reports he has to do bed mobilty  on his own at home    Time 4   Period Weeks   Status On-going     PT SHORT TERM GOAL #2   Title Patient to be able to tolerate at least 5 minutes of unsupported standing with RPE no more than 5/10 in order to demonstrate improved functional activity tolerance and balance    Baseline 9/5- reports able to perform ADLS unsupported at home   Time 4   Period Weeks   Status Partially Met     PT SHORT TERM GOAL #3   Title Patient to be able to ambulate at least 452f with LRAD and Mod(I), RPE no more than 5/10 in order to demonstrate improved mobility and QOL    Baseline 9/5- 452 with walker, high fatigue    Time 4   Period Weeks   Status Partially Met     PT SHORT TERM GOAL #4   Title Patient to be able to perform stand-pivot transfers with LRAD on a Mod(I) basis in order to improve QOL and function at home    Period Weeks   Status Achieved     PT SHORT TERM GOAL  #5   Title Patient to be indepedent in correctly and consistently performing appropriate HEP, to be updated PRN   Baseline 9/5- reports compliance    Time 4   Period Weeks   Status Achieved           PT Long Term Goals - 04/16/16 1542      PT LONG TERM GOAL #1   Title Patient to demonstrate proximal muscle strength at least 4/5 in order to assist in improving balance and tolerance to closed chain activities such as gait    Time 8   Period Weeks   Status On-going     PT LONG TERM GOAL #2   Title Patient to be able to tolerate 15 minutes of unsupported standing in order to demonstrate improved functional activity tolernace and strength, assist in self care based activities    Time 8   Period Weeks   Status On-going     PT LONG TERM GOAL #3   Title Patient to be able to ambulate at least 10071fwith LRAD, RPE no more than 5/10, with Mod(I) in order to improve general mobility and allow patient to access community    Time 8   Period Weeks   Status On-going     PT LONG TERM GOAL #4   Title Patient to be able to hold unsupported tandem stance at least 60 seconds B and SLS at least 30 seconds B in order to demonstrate improved balance and reduced fall risk    Time 8   Period Weeks   Status On-going     PT LONG TERM GOAL #5   Title Patient to be educated on benefits of and participatory in water exercise program at least 2 times per week in order to maintain functional gains and promote regular activity within safe and well tolerated environment   Time 8   Period Weeks   Status On-going               Plan - 04/30/16 1209    Clinical Impression Statement Began session by re-examining R ankle, which does appear more swollen and mildly tender to touch, confirming possible diagnosis of low grade ankle sprain; patient did receive infusion yesterday and appears to be feeling better in general. Continued with gait and CKC exercises as tolerated, with PT tech  on standby and  providing precautionary knee guarding as per DPT direction during all CKC exercises in addition to DPT guarding of patient for safety. Patient continues to be limited by fatigue; updated HEP today as well. Recommend addition of 4 way ankle exercises next session.    Rehab Potential Good   Clinical Impairments Affecting Rehab Potential chronic neuromuscular disease    PT Frequency 2x / week   PT Duration 4 weeks   PT Treatment/Interventions ADLs/Self Care Home Management;Biofeedback;Cryotherapy;Moist Heat;DME Instruction;Gait training;Stair training;Functional mobility training;Therapeutic activities;Therapeutic exercise;Balance training;Neuromuscular re-education;Patient/family education;Manual techniques;Energy conservation;Taping   PT Next Visit Plan add 4-way ankle exercises to HEP next session. DO NOT PUSH PAST 50-60% EXERTION PER MD. Continue proximal strengthening, quadruped. CKC exercises in parallel bars with 2nd person to help guard OR in BWS system for safety. Gait with trekking poles.    PT Home Exercise Plan 9/19: HHPT program, added bridges, toe/heel raises, seated hip ABD with red band on 9/19    Consulted and Agree with Plan of Care Patient      Patient will benefit from skilled therapeutic intervention in order to improve the following deficits and impairments:  Abnormal gait, Improper body mechanics, Cardiopulmonary status limiting activity, Decreased coordination, Decreased mobility, Postural dysfunction, Decreased activity tolerance, Decreased endurance, Decreased strength, Decreased balance, Difficulty walking, Impaired flexibility  Visit Diagnosis: Other symptoms and signs involving the musculoskeletal system  Difficulty in walking, not elsewhere classified  Unsteadiness on feet  Muscle weakness (generalized)     Problem List Patient Active Problem List   Diagnosis Date Noted  . Bilateral leg weakness 01/24/2015  . Elevated CPK 01/24/2015  . Transaminitis  10/24/2014    Deniece Ree PT, DPT [place] [address] [place], [place], [address] Phone: [phone]   Fax:  [phone]  Name: DEAKIN LACEK MRN: [id number] Date of Birth: 20-Feb-1956

## 2016-04-30 NOTE — Therapy (Addendum)
[place], [place], [address] Phone: [phone]   Fax:  [phone]  Occupational Therapy Treatment  Patient Details  Name: Jacob Hunter MRN: [id number] Date of Birth: 1956/07/24 Referring Provider: Dr. Asencion Noble  Encounter Date: 04/30/2016      OT End of Session - 04/30/16 1030    Visit Number 11   Number of Visits 16   Date for OT Re-Evaluation 05/23/16   Authorization Type [employer]   Authorization Time Period Visit limit 60 - 0 used   Authorization - Visit Number 11   Authorization - Number of Visits 63   OT Start Time 0950   OT Stop Time 1030   OT Time Calculation (min) 40 min   Activity Tolerance Patient tolerated treatment well   Behavior During Therapy [facility] for tasks assessed/performed      Past Medical History:  Diagnosis Date  . Arthritis   . Hypercholesteremia   . Knee pain   . Necrotizing myopathy   . Polymyositis [facility])     Past Surgical History:  Procedure Laterality Date  . ANKLE SURGERY Left   . KNEE SURGERY Left     There were no vitals filed for this visit.      Subjective Assessment - 04/30/16 0959    Subjective  S: I had my first fusion yesterday. I actually feel a little stronger.    Currently in Pain? No/denies            [facility] OT Assessment - 04/30/16 0959      Assessment   Diagnosis BUE weakness     Precautions   Precautions Other (comment)   Precaution Comments CAN ONLY GO TO 50-60% OF MAX RIGHT NOW                   OT Treatments/Exercises (OP) - 04/30/16 0959      Exercises   Exercises Shoulder     Shoulder Exercises: Seated   Protraction AROM;10 reps  Hold green therapy ball 65 cm)   Horizontal ABduction Strengthening;10 reps   Horizontal ABduction Weight (lbs) 1   External Rotation Strengthening;10 reps   External Rotation Weight (lbs) 1   Internal Rotation Strengthening;10 reps   Internal Rotation Weight (lbs) 1    Abduction Strengthening;10 reps   ABduction Weight (lbs) 1   Other Seated Exercises Using green therapy ball (65 cm): shoulder flexion with core activation; horzontal abduction/adduction with arms extended, woodchops; 10X     Shoulder Exercises: ROM/Strengthening   UBE (Upper Arm Bike) Level 1 2' flexion 2' reverse     Neurological Re-education Exercises   Trunk Exercises Core Activation   Trunk Core Activation seated sit ups with rope, alternating cross body crunch, seated alternating knee lifts, russian twists seated, 10X                  OT Short Term Goals - 04/25/16 1807      OT SHORT TERM GOAL #1   Title Patient will be educated and independent with HEP to increase functional performance during daily tasks.    Time 4   Period Weeks     OT SHORT TERM GOAL #2   Title Patient will increase BUE shoulder and elbow strength to 4/5 to increase ability to complete lightweight lifting activities.   Time 4   Period Weeks   Status On-going     OT SHORT TERM GOAL #3   Title Patient will increase  overal activity tolerance by completing a simple household task for 10-15 minutes without increased fatigue.    Time 4   Period Weeks     OT SHORT TERM GOAL #4   Title Patient will increase overall core strength to increase ability to complete sit to stands with less difficulty.     Time 4   Period Weeks   Status Achieved           OT Long Term Goals - 04/23/16 1136      OT LONG TERM GOAL #1   Title Pt will return to prior level of independence and functioning during daily and work tasks.    Time 8   Period Weeks   Status On-going     OT LONG TERM GOAL #2   Title Patient will increase BUE shoulder and elbow strength to 4+/5 or greater to increase ability to complete work related tasks as well as be able to get up from off the floor as needed.   Time 8   Period Weeks   Status On-going     OT LONG TERM GOAL #3   Title Patient will increase overal activity tolerance  by completing a simple household task for 30-35 minutes without increased fatigue.    Time 8   Period Weeks   Status On-going     OT LONG TERM GOAL #4   Title Patient will increase BUE grip strength to within average norms for age in order to be able to open tight lids or containers with less difficulty.    Time 8   Period Weeks   Status On-going               Plan - 04/30/16 1030    Clinical Impression Statement A: Pt reports that fusion went well. Right ankle with decreased pain on Saturday in which he was able to stand up for the first time since fall. Patient with less exertion during core and UB strengthening exercises. Breaks were taken as needed although they were shorter.    Plan P: Continue with UB and core strengthening. Attempt a standing activity at table top. Have chair and provide close supervision when standing.       Patient will benefit from skilled therapeutic intervention in order to improve the following deficits and impairments:  Decreased endurance, Decreased activity tolerance, Decreased strength  Visit Diagnosis: Other symptoms and signs involving the musculoskeletal system    Problem List Patient Active Problem List   Diagnosis Date Noted  . Bilateral leg weakness 01/24/2015  . Elevated CPK 01/24/2015  . Transaminitis 10/24/2014   Ailene Ravel, OTR/L,CBIS  [phone]  04/30/2016, 12:03 PM  [place] [address] [place], [place], [address] Phone: [phone]   Fax:  [phone]  Name: Jacob Hunter MRN: [id number] Date of Birth: 02-08-1956

## 2016-05-02 ENCOUNTER — Encounter (HOSPITAL_COMMUNITY): Payer: Self-pay

## 2016-05-02 ENCOUNTER — Ambulatory Visit (HOSPITAL_COMMUNITY): Payer: 59

## 2016-05-02 DIAGNOSIS — R262 Difficulty in walking, not elsewhere classified: Secondary | ICD-10-CM | POA: Diagnosis not present

## 2016-05-02 DIAGNOSIS — R2681 Unsteadiness on feet: Secondary | ICD-10-CM

## 2016-05-02 DIAGNOSIS — M6281 Muscle weakness (generalized): Secondary | ICD-10-CM

## 2016-05-02 DIAGNOSIS — R29898 Other symptoms and signs involving the musculoskeletal system: Secondary | ICD-10-CM

## 2016-05-02 NOTE — Patient Instructions (Signed)
Hamstring Stretch, Seated (Strap, Two Chairs)    Sit with one leg extended onto facing chair. Loop strap over outstretched foot at ball of big toe. Lengthen spine. Hold for 30 seconds. Repeat 3 times each leg.  Copyright  VHI. All rights reserved.   Ankle Plantar Flexion: Long-Sitting (Single Leg)    Loop tubing around foot of straight leg, anchor with one hand. Leg straight, point toes downward. Repeat 10 times per set. Repeat with other leg. Do 1-2 sets per session. Do 4 sessions per week.  http://tub.exer.us/215   Copyright  VHI. All rights reserved.   Dorsiflexion (Eccentric), (Resistance Band)    Pull foot up against resistance band. Slowly release for 3-5 seconds. Use resistance band. 10 reps per set, 1 sets per day, 4 days per week.  http://ecce.exer.us/0   Copyright  VHI. All rights reserved.   Eversion / Dorsiflexion (Eccentric), (Resistance Band)    Pull foot out and down against resistance band. Slowly release for 3-5 seconds. Use resistance band. 10 reps per set, 1 sets per day, 4 days per week.  http://ecce.exer.us/14   Copyright  VHI. All rights reserved.   Eversion / Dorsiflexion (Eccentric), (Resistance Band)    Pull foot out and down against resistance band. Slowly release for 3-5 seconds. Use resistance band. 10 reps per set, 1 sets per day, 4 days per week.  http://ecce.exer.us/14   Copyright  VHI. All rights reserved.

## 2016-05-02 NOTE — Therapy (Signed)
[place], [place], [address] Phone: [phone]   Fax:  [phone]  Physical Therapy Treatment  Patient Details  Name: Jacob Hunter MRN: [id number] Date of Birth: 1956-01-15 Referring Provider: Asencion Noble   Encounter Date: 05/02/2016      PT End of Session - 05/02/16 1210    Visit Number 13   Number of Visits 16   Date for PT Re-Evaluation 05/14/16   Authorization Type [employer] Plus (60 visits between PT/OT/speech)   Authorization Time Period 03/18/16 to 05/18/16   PT Start Time 1120   PT Stop Time 1203   PT Time Calculation (min) 43 min   Equipment Utilized During Treatment --  weight bearing support system   Activity Tolerance Patient tolerated treatment well;Patient limited by fatigue   Behavior During Therapy [facility] for tasks assessed/performed      Past Medical History:  Diagnosis Date  . Arthritis   . Hypercholesteremia   . Knee pain   . Necrotizing myopathy   . Polymyositis [facility])     Past Surgical History:  Procedure Laterality Date  . ANKLE SURGERY Left   . KNEE SURGERY Left     There were no vitals filed for this visit.      Subjective Assessment - 05/02/16 1157    Subjective Pt stated his ankle swolled following standing last session, has been applying ice for pain and edema control.  No reports of pain through session.   Pertinent History history of L knee arthroscopic surgery, history of L ankle surgery, no other signifcant PMH    Patient Stated Goals improve mobilty, get stronger    Currently in Pain? No/denies                         [facility] Adult PT Treatment/Exercise - 05/02/16 1258      Knee/Hip Exercises: Stretches   Active Hamstring Stretch 30 seconds;2 reps   Active Hamstring Stretch Limitations seated      Knee/Hip Exercises: Standing   Forward Lunges Limitations attemped lunge 6 in step, Rt knee buckled; utilized WBS   Gait Training  gait training no AD with WBS   Other Standing Knee Exercises stanidng no AD to improve posture with WBS   Other Standing Knee Exercises standing in //bars shoulder flexion with orange ball 2 reps prior fatigue     Knee/Hip Exercises: Seated   Other Seated Knee/Hip Exercises RTB Bil ankle all directions 10x      Shoulder Exercises: Seated   Horizontal ABduction Theraband;10 reps   Theraband Level (Shoulder Horizontal ABduction) Level 2 (Red)   External Rotation Theraband;10 reps   Theraband Level (Shoulder External Rotation) Level 2 (Red)   Internal Rotation Theraband;10 reps   Theraband Level (Shoulder Internal Rotation) Level 2 (Red)   Other Seated Exercises Seated Chair push ups; 10X; 2 sets with 5 second hold at top.   Other Seated Exercises Horizontal adduction; 10X each arm; red theraband                  PT Short Term Goals - 04/16/16 1540      PT SHORT TERM GOAL #1   Title Patient to be independent in all bed mobility in order to improve function and QOL at home    Baseline 9/5- reports he has to do bed mobilty on his own at home    Time 4   Period Weeks  Status On-going     PT SHORT TERM GOAL #2   Title Patient to be able to tolerate at least 5 minutes of unsupported standing with RPE no more than 5/10 in order to demonstrate improved functional activity tolerance and balance    Baseline 9/5- reports able to perform ADLS unsupported at home   Time 4   Period Weeks   Status Partially Met     PT SHORT TERM GOAL #3   Title Patient to be able to ambulate at least 451f with LRAD and Mod(I), RPE no more than 5/10 in order to demonstrate improved mobility and QOL    Baseline 9/5- 452 with walker, high fatigue    Time 4   Period Weeks   Status Partially Met     PT SHORT TERM GOAL #4   Title Patient to be able to perform stand-pivot transfers with LRAD on a Mod(I) basis in order to improve QOL and function at home    Period Weeks   Status Achieved     PT  SHORT TERM GOAL #5   Title Patient to be indepedent in correctly and consistently performing appropriate HEP, to be updated PRN   Baseline 9/5- reports compliance    Time 4   Period Weeks   Status Achieved           PT Long Term Goals - 04/16/16 1542      PT LONG TERM GOAL #1   Title Patient to demonstrate proximal muscle strength at least 4/5 in order to assist in improving balance and tolerance to closed chain activities such as gait    Time 8   Period Weeks   Status On-going     PT LONG TERM GOAL #2   Title Patient to be able to tolerate 15 minutes of unsupported standing in order to demonstrate improved functional activity tolernace and strength, assist in self care based activities    Time 8   Period Weeks   Status On-going     PT LONG TERM GOAL #3   Title Patient to be able to ambulate at least 10061fwith LRAD, RPE no more than 5/10, with Mod(I) in order to improve general mobility and allow patient to access community    Time 8   Period Weeks   Status On-going     PT LONG TERM GOAL #4   Title Patient to be able to hold unsupported tandem stance at least 60 seconds B and SLS at least 30 seconds B in order to demonstrate improved balance and reduced fall risk    Time 8   Period Weeks   Status On-going     PT LONG TERM GOAL #5   Title Patient to be educated on benefits of and participatory in water exercise program at least 2 times per week in order to maintain functional gains and promote regular activity within safe and well tolerated environment   Time 8   Period Weeks   Status On-going               Plan - 05/02/16 1530    Clinical Impression Statement Added 4-way theraband exercises for ankle strengthening, pt given HEP printout and reports he owns red and green theraband.  All standing exercises complete this session with weight bearing support system.  Attempted lunges with knee guarding and HHA, Rt knee buckled with first attempt.  Standing balance  activities complete without HHA (WBS used) with cueing to improve posture with moderate difficulty and  increased fatigue.  No reports of pain through session.     Rehab Potential Good   Clinical Impairments Affecting Rehab Potential chronic neuromuscular disease    PT Frequency 2x / week   PT Duration 4 weeks   PT Treatment/Interventions ADLs/Self Care Home Management;Biofeedback;Cryotherapy;Moist Heat;DME Instruction;Gait training;Stair training;Functional mobility training;Therapeutic activities;Therapeutic exercise;Balance training;Neuromuscular re-education;Patient/family education;Manual techniques;Energy conservation;Taping   PT Next Visit Plan Continue all standing exercises with weight bearing support for safety. DO NOT PUSH PAST 50-60% EXERTION PER MD. Continue proximal strengthening, quadruped. CKC exercises in parallel bars with 2nd person to help guard OR in BWS system for safety. Gait with trekking poles.    PT Home Exercise Plan 9/19: HHPT program, added bridges, toe/heel raises, seated hip ABD with red band on 9/19       Patient will benefit from skilled therapeutic intervention in order to improve the following deficits and impairments:  Abnormal gait, Improper body mechanics, Cardiopulmonary status limiting activity, Decreased coordination, Decreased mobility, Postural dysfunction, Decreased activity tolerance, Decreased endurance, Decreased strength, Decreased balance, Difficulty walking, Impaired flexibility  Visit Diagnosis: Other symptoms and signs involving the musculoskeletal system  Difficulty in walking, not elsewhere classified  Unsteadiness on feet  Muscle weakness (generalized)     Problem List Patient Active Problem List   Diagnosis Date Noted  . Bilateral leg weakness 01/24/2015  . Elevated CPK 01/24/2015  . Transaminitis 10/24/2014   Ihor Austin, [place]; [place]  Aldona Lento 05/02/2016, 3:38 PM  [place] [address] [place], [place], [address] Phone: [phone]   Fax:  [phone]  Name: Jacob Hunter MRN: [id number] Date of Birth: Feb 22, 1956

## 2016-05-02 NOTE — Therapy (Signed)
[place], [place], [address] Phone: [phone]   Fax:  [phone]  Occupational Therapy Treatment  Patient Details  Name: Jacob Hunter MRN: [id number] Date of Birth: 09/25/1955 Referring Provider: Dr. Asencion Noble  Encounter Date: 05/02/2016      OT End of Session - 05/02/16 1335    Visit Number 12   Number of Visits 16   Date for OT Re-Evaluation 05/23/16   Authorization Type [employer]   Authorization Time Period Visit limit 60 - 0 used   Authorization - Visit Number 12   Authorization - Number of Visits 55   OT Start Time 1030   OT Stop Time 1115   OT Time Calculation (min) 45 min   Activity Tolerance Patient tolerated treatment well   Behavior During Therapy [facility] for tasks assessed/performed      Past Medical History:  Diagnosis Date  . Arthritis   . Hypercholesteremia   . Knee pain   . Necrotizing myopathy   . Polymyositis [facility])     Past Surgical History:  Procedure Laterality Date  . ANKLE SURGERY Left   . KNEE SURGERY Left     There were no vitals filed for this visit.      Subjective Assessment - 05/02/16 1059    Subjective  S: I baked 4 cakes yesterday.   Currently in Pain? No/denies            [facility] OT Assessment - 05/02/16 1100      Assessment   Diagnosis BUE weakness     Precautions   Precautions Other (comment)   Precaution Comments CAN ONLY GO TO 50-60% OF MAX RIGHT NOW                   OT Treatments/Exercises (OP) - 05/02/16 1100      Exercises   Exercises Shoulder     Shoulder Exercises: Seated   Horizontal ABduction Theraband;10 reps   Theraband Level (Shoulder Horizontal ABduction) Level 2 (Red)   External Rotation Theraband;10 reps   Theraband Level (Shoulder External Rotation) Level 2 (Red)   Internal Rotation Theraband;10 reps   Theraband Level (Shoulder Internal Rotation) Level 2 (Red)   Abduction Theraband;10 reps   Theraband  Level (Shoulder ABduction) Level 2 (Red)   Other Seated Exercises Seated Chair push ups; 10X; 2 sets with 5 second hold at top.   Other Seated Exercises Horizontal adduction; 10X each arm; red theraband     Neurological Re-education Exercises   Trunk Exercises Other   Other Trunk Exercises Patient completed 2 separate standing tasks at table top with Supervision and walker. Patient completed first task while standing for 3 minutes and the second task was completed while standing for 9'40"                  OT Short Term Goals - 04/25/16 1807      OT SHORT TERM GOAL #1   Title Patient will be educated and independent with HEP to increase functional performance during daily tasks.    Time 4   Period Weeks     OT SHORT TERM GOAL #2   Title Patient will increase BUE shoulder and elbow strength to 4/5 to increase ability to complete lightweight lifting activities.   Time 4   Period Weeks   Status On-going     OT SHORT TERM GOAL #3   Title Patient will increase overal activity tolerance by completing a  simple household task for 10-15 minutes without increased fatigue.    Time 4   Period Weeks     OT SHORT TERM GOAL #4   Title Patient will increase overall core strength to increase ability to complete sit to stands with less difficulty.     Time 4   Period Weeks   Status Achieved           OT Long Term Goals - 04/23/16 1136      OT LONG TERM GOAL #1   Title Pt will return to prior level of independence and functioning during daily and work tasks.    Time 8   Period Weeks   Status On-going     OT LONG TERM GOAL #2   Title Patient will increase BUE shoulder and elbow strength to 4+/5 or greater to increase ability to complete work related tasks as well as be able to get up from off the floor as needed.   Time 8   Period Weeks   Status On-going     OT LONG TERM GOAL #3   Title Patient will increase overal activity tolerance by completing a simple household task  for 30-35 minutes without increased fatigue.    Time 8   Period Weeks   Status On-going     OT LONG TERM GOAL #4   Title Patient will increase BUE grip strength to within average norms for age in order to be able to open tight lids or containers with less difficulty.    Time 8   Period Weeks   Status On-going               Plan - 05/02/16 1335    Clinical Impression Statement A: Pt completed standing task focusing on activity tolerance with close supervision given for safety. Patient did well with standing tolerance task while using RW for support as he always had at least one hand on walker at all times.    Plan P: Continue with UB and core strengthening exercises.       Patient will benefit from skilled therapeutic intervention in order to improve the following deficits and impairments:  Decreased endurance, Decreased activity tolerance, Decreased strength  Visit Diagnosis: Other symptoms and signs involving the musculoskeletal system    Problem List Patient Active Problem List   Diagnosis Date Noted  . Bilateral leg weakness 01/24/2015  . Elevated CPK 01/24/2015  . Transaminitis 10/24/2014   Ailene Ravel, OTR/L,CBIS  [phone]  05/02/2016, 2:00 PM  [facility] [address] [place], [place], [address] Phone: [phone]   Fax:  [phone]  Name: Jacob Hunter MRN: [id number] Date of Birth: [birth year]

## 2016-05-07 ENCOUNTER — Ambulatory Visit (HOSPITAL_COMMUNITY): Payer: 59

## 2016-05-07 ENCOUNTER — Encounter (HOSPITAL_COMMUNITY): Payer: Self-pay

## 2016-05-07 DIAGNOSIS — R262 Difficulty in walking, not elsewhere classified: Secondary | ICD-10-CM | POA: Diagnosis not present

## 2016-05-07 DIAGNOSIS — R2681 Unsteadiness on feet: Secondary | ICD-10-CM

## 2016-05-07 DIAGNOSIS — R29898 Other symptoms and signs involving the musculoskeletal system: Secondary | ICD-10-CM

## 2016-05-07 DIAGNOSIS — M6281 Muscle weakness (generalized): Secondary | ICD-10-CM

## 2016-05-07 NOTE — Therapy (Signed)
[place], [place], [address] Phone: [phone]   Fax:  [phone]  Physical Therapy Treatment  Patient Details  Name: Jacob Hunter MRN: [id number] Date of Birth: August 30, 1955 Referring Provider: Asencion Noble   Encounter Date: 05/07/2016      PT End of Session - 05/07/16 1613    Visit Number 14   Number of Visits 16   Date for PT Re-Evaluation 05/14/16   Authorization Type [employer] (60 visits between PT/OT/speech)   Authorization Time Period 03/18/16 to 05/18/16   PT Start Time 1603   PT Stop Time 1646   PT Time Calculation (min) 43 min   Equipment Utilized During Treatment Gait belt   Activity Tolerance Patient tolerated treatment well;Patient limited by fatigue   Behavior During Therapy [facility] for tasks assessed/performed      Past Medical History:  Diagnosis Date  . Arthritis   . Hypercholesteremia   . Knee pain   . Necrotizing myopathy   . Polymyositis [facility])     Past Surgical History:  Procedure Laterality Date  . ANKLE SURGERY Left   . KNEE SURGERY Left     There were no vitals filed for this visit.      Subjective Assessment - 05/07/16 1611    Subjective Pt stated he is tired upon beginning of PT, no reports of pain today.  Has began new HEP with no questions, reports they are tiring.  Has started walking more around the house.     Pertinent History history of L knee arthroscopic surgery, history of L ankle surgery, no other signifcant PMH    Patient Stated Goals improve mobilty, get stronger    Currently in Pain? No/denies                         [facility] Adult PT Treatment/Exercise - 05/07/16 1619      Ambulation/Gait   Ambulation/Gait Yes   Ambulation/Gait Assistance 4: Min guard   Ambulation Distance (Feet) 226 Feet   Assistive device Rolling walker   Gait Pattern Step-through pattern;Decreased stance time - left;Decreased step length -  right;Decreased dorsiflexion - left;Decreased weight shift to left   Ambulation Surface Level;Indoor   Gait Comments limited by fatigue      Knee/Hip Exercises: Standing   Heel Raises Both;1 set;10 reps   Heel Raises Limitations heel and toe      Knee/Hip Exercises: Seated   Other Seated Knee/Hip Exercises sitting on dynadisc throw and catch orange ball different angles, push out and overhead 10x     Knee/Hip Exercises: Prone   Hip Extension 2 sets   Hip Extension Limitations Prone press up to quadruped; able to complete 2 quadruped to tall kneeling   Straight Leg Raises 3 sets   Straight Leg Raises Limitations crawling forward and backwards 3RT on big mat                  PT Short Term Goals - 04/16/16 1540      PT SHORT TERM GOAL #1   Title Patient to be independent in all bed mobility in order to improve function and QOL at home    Baseline 9/5- reports he has to do bed mobilty on his own at home    Time 4   Period Weeks   Status On-going     PT SHORT TERM GOAL #2   Title Patient to be able to  tolerate at least 5 minutes of unsupported standing with RPE no more than 5/10 in order to demonstrate improved functional activity tolerance and balance    Baseline 9/5- reports able to perform ADLS unsupported at home   Time 4   Period Weeks   Status Partially Met     PT SHORT TERM GOAL #3   Title Patient to be able to ambulate at least 452f with LRAD and Mod(I), RPE no more than 5/10 in order to demonstrate improved mobility and QOL    Baseline 9/5- 452 with walker, high fatigue    Time 4   Period Weeks   Status Partially Met     PT SHORT TERM GOAL #4   Title Patient to be able to perform stand-pivot transfers with LRAD on a Mod(I) basis in order to improve QOL and function at home    Period Weeks   Status Achieved     PT SHORT TERM GOAL #5   Title Patient to be indepedent in correctly and consistently performing appropriate HEP, to be updated PRN   Baseline  9/5- reports compliance    Time 4   Period Weeks   Status Achieved           PT Long Term Goals - 04/16/16 1542      PT LONG TERM GOAL #1   Title Patient to demonstrate proximal muscle strength at least 4/5 in order to assist in improving balance and tolerance to closed chain activities such as gait    Time 8   Period Weeks   Status On-going     PT LONG TERM GOAL #2   Title Patient to be able to tolerate 15 minutes of unsupported standing in order to demonstrate improved functional activity tolernace and strength, assist in self care based activities    Time 8   Period Weeks   Status On-going     PT LONG TERM GOAL #3   Title Patient to be able to ambulate at least 10084fwith LRAD, RPE no more than 5/10, with Mod(I) in order to improve general mobility and allow patient to access community    Time 8   Period Weeks   Status On-going     PT LONG TERM GOAL #4   Title Patient to be able to hold unsupported tandem stance at least 60 seconds B and SLS at least 30 seconds B in order to demonstrate improved balance and reduced fall risk    Time 8   Period Weeks   Status On-going     PT LONG TERM GOAL #5   Title Patient to be educated on benefits of and participatory in water exercise program at least 2 times per week in order to maintain functional gains and promote regular activity within safe and well tolerated environment   Time 8   Period Weeks   Status On-going               Plan - 05/07/16 1650    Clinical Impression Statement Session focus on improving activiity tolerance and proximal musculature strengthening.  Pt is making gains with activity tolerance with ability to ambulate 226 x 2 sets at beginning and end of session though is limited by fatigue wtih activity.  Resumed quadruped activities with increased ease and ability to complete tall kneeling this session.  No reports of pain through session.  Fatgiue level stayed between 50-60%.   Rehab Potential Good    Clinical Impairments Affecting Rehab Potential chronic neuromuscular disease  PT Frequency 2x / week   PT Duration 4 weeks   PT Treatment/Interventions ADLs/Self Care Home Management;Biofeedback;Cryotherapy;Moist Heat;DME Instruction;Gait training;Stair training;Functional mobility training;Therapeutic activities;Therapeutic exercise;Balance training;Neuromuscular re-education;Patient/family education;Manual techniques;Energy conservation;Taping   PT Next Visit Plan Continue all standing exercises with weight bearing support for safety. DO NOT PUSH PAST 50-60% EXERTION PER MD. Continue proximal strengthening, quadruped. CKC exercises in parallel bars with 2nd person to help guard OR in BWS system for safety. Gait with trekking poles.       Patient will benefit from skilled therapeutic intervention in order to improve the following deficits and impairments:  Abnormal gait, Improper body mechanics, Cardiopulmonary status limiting activity, Decreased coordination, Decreased mobility, Postural dysfunction, Decreased activity tolerance, Decreased endurance, Decreased strength, Decreased balance, Difficulty walking, Impaired flexibility  Visit Diagnosis: Other symptoms and signs involving the musculoskeletal system  Difficulty in walking, not elsewhere classified  Unsteadiness on feet  Muscle weakness (generalized)     Problem List Patient Active Problem List   Diagnosis Date Noted  . Bilateral leg weakness 01/24/2015  . Elevated CPK 01/24/2015  . Transaminitis 10/24/2014   Ihor Austin, LPTA; [place]  Aldona Lento 05/07/2016, 4:56 PM  [facility] [address] [place], [place], [address] Phone: [phone]   Fax:  [phone]  Name: NICHOLE KELTNER MRN: [id number] Date of Birth: Nov 13, 1955

## 2016-05-08 ENCOUNTER — Encounter (HOSPITAL_COMMUNITY): Payer: Self-pay

## 2016-05-08 NOTE — Therapy (Signed)
[place], [place], [address] Phone: [phone]   Fax:  [phone]  Occupational Therapy Treatment  Patient Details  Name: Jacob Hunter MRN: [id number] Date of Birth: Apr 29, 1956 Referring Provider: Dr. Asencion Noble  Encounter Date: 05/07/2016      OT End of Session - 05/08/16 1250    Visit Number 13   Number of Visits 16   Date for OT Re-Evaluation 05/23/16   Authorization Type [employer]   Authorization Time Period Visit limit 60 - 0 used   Authorization - Visit Number 13   Authorization - Number of Visits 60   OT Start Time 1520   OT Stop Time 1600   OT Time Calculation (min) 40 min   Activity Tolerance Patient tolerated treatment well   Behavior During Therapy [facility] for tasks assessed/performed      Past Medical History:  Diagnosis Date  . Arthritis   . Hypercholesteremia   . Knee pain   . Necrotizing myopathy   . Polymyositis [facility])     Past Surgical History:  Procedure Laterality Date  . ANKLE SURGERY Left   . KNEE SURGERY Left     There were no vitals filed for this visit.      Subjective Assessment - 05/07/16 1531    Subjective  S:    Currently in Pain? No/denies            [facility] OT Assessment - 05/07/16 1531      Assessment   Diagnosis BUE weakness     Precautions   Precautions Other (comment)   Precaution Comments CAN ONLY GO TO 50-60% OF MAX RIGHT NOW                   OT Treatments/Exercises (OP) - 05/07/16 1531      Exercises   Exercises Shoulder     Shoulder Exercises: Seated   Protraction Strengthening;10 reps   Protraction Weight (lbs) 2   Horizontal ABduction Strengthening;10 reps   Horizontal ABduction Weight (lbs) 2   External Rotation Strengthening;10 reps   External Rotation Weight (lbs) 2   Internal Rotation Strengthening;10 reps   Internal Rotation Weight (lbs) 2   Flexion Strengthening;10 reps   Flexion Weight (lbs) 2   Abduction Strengthening;10 reps   ABduction Weight (lbs) 2     Shoulder Exercises: ROM/Strengthening   UBE (Upper Arm Bike) Level 1 2' flexion 2' reverse   Other ROM/Strengthening Exercises Tricep extension     Neurological Re-education Exercises   Other Exercises 1 Pt completed 5 sit to stands. Focused on core activation and slow and controlled decent     Trunk Exercises Core Activation   Trunk Core Activation seated Russian twist holding 3# handweights; 10X. Seated leg extension to knee flexion; 10X. Seated alternating elbow to knee crunch; 10X                  OT Short Term Goals - 04/25/16 1807      OT SHORT TERM GOAL #1   Title Patient will be educated and independent with HEP to increase functional performance during daily tasks.    Time 4   Period Weeks     OT SHORT TERM GOAL #2   Title Patient will increase BUE shoulder and elbow strength to 4/5 to increase ability to complete lightweight lifting activities.   Time 4   Period Weeks   Status On-going     OT SHORT TERM GOAL #  3   Title Patient will increase overal activity tolerance by completing a simple household task for 10-15 minutes without increased fatigue.    Time 4   Period Weeks     OT SHORT TERM GOAL #4   Title Patient will increase overall core strength to increase ability to complete sit to stands with less difficulty.     Time 4   Period Weeks   Status Achieved           OT Long Term Goals - 04/23/16 1136      OT LONG TERM GOAL #1   Title Pt will return to prior level of independence and functioning during daily and work tasks.    Time 8   Period Weeks   Status On-going     OT LONG TERM GOAL #2   Title Patient will increase BUE shoulder and elbow strength to 4+/5 or greater to increase ability to complete work related tasks as well as be able to get up from off the floor as needed.   Time 8   Period Weeks   Status On-going     OT LONG TERM GOAL #3   Title Patient will increase  overal activity tolerance by completing a simple household task for 30-35 minutes without increased fatigue.    Time 8   Period Weeks   Status On-going     OT LONG TERM GOAL #4   Title Patient will increase BUE grip strength to within average norms for age in order to be able to open tight lids or containers with less difficulty.    Time 8   Period Weeks   Status On-going               Plan - 05/08/16 1252    Clinical Impression Statement A: Patient focused on UB strneghtening, core strengthening and sit to stand. Required VC for form during sit to stands   Plan P: Continue with core strengthening.      Patient will benefit from skilled therapeutic intervention in order to improve the following deficits and impairments:  Decreased endurance, Decreased activity tolerance, Decreased strength  Visit Diagnosis: Other symptoms and signs involving the musculoskeletal system    Problem List Patient Active Problem List   Diagnosis Date Noted  . Bilateral leg weakness 01/24/2015  . Elevated CPK 01/24/2015  . Transaminitis 10/24/2014  Jacob Hunter, OTR/L,CBIS  [phone]   05/08/2016, 2:57 PM  [facility] [address] [place], [place], [address] Phone: [phone]   Fax:  [phone]  Name: Jacob Hunter MRN: [id number] Date of Birth: 03-20-1956

## 2016-05-09 ENCOUNTER — Ambulatory Visit (HOSPITAL_COMMUNITY): Payer: 59

## 2016-05-09 ENCOUNTER — Ambulatory Visit (HOSPITAL_COMMUNITY): Payer: 59 | Admitting: Physical Therapy

## 2016-05-09 ENCOUNTER — Encounter (HOSPITAL_COMMUNITY): Payer: Self-pay

## 2016-05-09 DIAGNOSIS — R2681 Unsteadiness on feet: Secondary | ICD-10-CM

## 2016-05-09 DIAGNOSIS — R29898 Other symptoms and signs involving the musculoskeletal system: Secondary | ICD-10-CM

## 2016-05-09 DIAGNOSIS — M6281 Muscle weakness (generalized): Secondary | ICD-10-CM

## 2016-05-09 DIAGNOSIS — R262 Difficulty in walking, not elsewhere classified: Secondary | ICD-10-CM | POA: Diagnosis not present

## 2016-05-09 NOTE — Therapy (Signed)
[place], [place], [address] Phone: [phone]   Fax:  [phone]  Physical Therapy Treatment  Patient Details  Name: Jacob Hunter MRN: [id number] Date of Birth: 10-Aug-1956 Referring Provider: Asencion Noble   Encounter Date: 05/09/2016      PT End of Session - 05/09/16 1224    Visit Number 15   Number of Visits 16   Date for PT Re-Evaluation 05/14/16   Authorization Type [employer] (60 visits between PT/OT/speech)   Authorization Time Period 03/18/16 to 05/18/16   PT Start Time 1120   PT Stop Time 1200   PT Time Calculation (min) 40 min   Equipment Utilized During Treatment Gait belt   Activity Tolerance Patient tolerated treatment well;Patient limited by fatigue   Behavior During Therapy [facility] for tasks assessed/performed      Past Medical History:  Diagnosis Date  . Arthritis   . Hypercholesteremia   . Knee pain   . Necrotizing myopathy   . Polymyositis [facility])     Past Surgical History:  Procedure Laterality Date  . ANKLE SURGERY Left   . KNEE SURGERY Left     There were no vitals filed for this visit.      Subjective Assessment - 05/09/16 1219    Subjective Patient reports he's feeling well today, but is fatigued in his core due to his workout with OT; no falls or close calls since last session and he is planning on making more cakes this weekend    Pertinent History history of L knee arthroscopic surgery, history of L ankle surgery, no other signifcant PMH    Patient Stated Goals improve mobilty, get stronger    Currently in Pain? No/denies                         [facility] Adult PT Treatment/Exercise - 05/09/16 1220      Ambulation/Gait   Ambulation/Gait Yes   Ambulation/Gait Assistance 4: Min guard   Ambulation Distance (Feet) 290 Feet   Assistive device Other (Comment)  trekking poles    Gait Pattern Step-through pattern;Decreased stance time -  left;Decreased step length - right;Decreased dorsiflexion - left;Decreased weight shift to left   Ambulation Surface Level;Indoor   Gait Comments fatigue, multiple standing rest breaks during gait distance      Knee/Hip Exercises: Standing   Other Standing Knee Exercises modified quadruped: PWR ups x15, PWR rock to fatigue/failure, PWR twist to fatigue/failure      Knee/Hip Exercises: Supine   Bridges 1 set;15 reps   Bridges Limitations difficulty achieving fully extended bridge    Straight Leg Raises Both;1 set;10 reps   Straight Leg Raises Limitations 1#, min assist    Other Supine Knee/Hip Exercises clams 1x15 with green TB    Other Supine Knee/Hip Exercises supine hip ABD 1x10 with red TB, tacitile cues      Knee/Hip Exercises: Prone   Straight Leg Raises Both;1 set;10 reps   Straight Leg Raises Limitations min assist, tendency of quad to compensatie, extremely weak hip extensors                 PT Education - 05/09/16 1224    Education provided Yes   Education Details upcoming re-assessment    Person(s) Educated Patient   Methods Explanation   Comprehension Verbalized understanding          PT Short Term Goals - 04/16/16 1540  PT SHORT TERM GOAL #1   Title Patient to be independent in all bed mobility in order to improve function and QOL at home    Baseline 9/5- reports he has to do bed mobilty on his own at home    Time 4   Period Weeks   Status On-going     PT SHORT TERM GOAL #2   Title Patient to be able to tolerate at least 5 minutes of unsupported standing with RPE no more than 5/10 in order to demonstrate improved functional activity tolerance and balance    Baseline 9/5- reports able to perform ADLS unsupported at home   Time 4   Period Weeks   Status Partially Met     PT [place] #3   Title Patient to be able to ambulate at least 443f with LRAD and Mod(I), RPE no more than 5/10 in order to demonstrate improved mobility and QOL     Baseline 9/5- 452 with walker, high fatigue    Time 4   Period Weeks   Status Partially Met     PT SHORT TERM GOAL #4   Title Patient to be able to perform stand-pivot transfers with LRAD on a Mod(I) basis in order to improve QOL and function at home    Period Weeks   Status Achieved     PT SHORT TERM GOAL #5   Title Patient to be indepedent in correctly and consistently performing appropriate HEP, to be updated PRN   Baseline 9/5- reports compliance    Time 4   Period Weeks   Status Achieved           PT Long Term Goals - 04/16/16 1542      PT LONG TERM GOAL #1   Title Patient to demonstrate proximal muscle strength at least 4/5 in order to assist in improving balance and tolerance to closed chain activities such as gait    Time 8   Period Weeks   Status On-going     PT LONG TERM GOAL #2   Title Patient to be able to tolerate 15 minutes of unsupported standing in order to demonstrate improved functional activity tolernace and strength, assist in self care based activities    Time 8   Period Weeks   Status On-going     PT LONG TERM GOAL #3   Title Patient to be able to ambulate at least 10019fwith LRAD, RPE no more than 5/10, with Mod(I) in order to improve general mobility and allow patient to access community    Time 8   Period Weeks   Status On-going     PT LONG TERM GOAL #4   Title Patient to be able to hold unsupported tandem stance at least 60 seconds B and SLS at least 30 seconds B in order to demonstrate improved balance and reduced fall risk    Time 8   Period Weeks   Status On-going     PT LONG TERM GOAL #5   Title Patient to be educated on benefits of and participatory in water exercise program at least 2 times per week in order to maintain functional gains and promote regular activity within safe and well tolerated environment   Time 8   Period Weeks   Status On-going               Plan - 05/09/16 1226    Clinical Impression Statement  Patient found resting after skilled OT services, reporting feeling fatigued  in his core; initiated session with gait approximately 240f with trekking poles, limited by fatigue and standing rest breaks provided as needed. Otherwise performed proximal muscle strengthening today, and had to perform modified quadruped today due to fatigue, with rest breaks provided PRN also due to fatigue. Patient is due for re-assessment next session; while he does generally appear to be making slow but steady progress, recommend that re-assessing DPT review and adjust POC/HEP as needed to continue facilitating appropriate progress.    Rehab Potential Good   Clinical Impairments Affecting Rehab Potential chronic progressive neuromuscular disease    PT Frequency 2x / week   PT Duration 4 weeks   PT Treatment/Interventions ADLs/Self Care Home Management;Biofeedback;Cryotherapy;Moist Heat;DME Instruction;Gait training;Stair training;Functional mobility training;Therapeutic activities;Therapeutic exercise;Balance training;Neuromuscular re-education;Patient/family education;Manual techniques;Energy conservation;Taping   PT Next Visit Plan re-assess; re-evaluate HEP/POC for any  appropriate updates/changes    PT Home Exercise Plan 9/19: HHPT program, added bridges, toe/heel raises, seated hip ABD with red band on 9/19    Consulted and Agree with Plan of Care Patient      Patient will benefit from skilled therapeutic intervention in order to improve the following deficits and impairments:  Abnormal gait, Improper body mechanics, Cardiopulmonary status limiting activity, Decreased coordination, Decreased mobility, Postural dysfunction, Decreased activity tolerance, Decreased endurance, Decreased strength, Decreased balance, Difficulty walking, Impaired flexibility  Visit Diagnosis: Other symptoms and signs involving the musculoskeletal system  Difficulty in walking, not elsewhere classified  Unsteadiness on feet  Muscle  weakness (generalized)     Problem List Patient Active Problem List   Diagnosis Date Noted  . Bilateral leg weakness 01/24/2015  . Elevated CPK 01/24/2015  . Transaminitis 10/24/2014    KDeniece ReePT, DPT [id number] [place] [address]Phone: [phone]  Fax:  [phone] Name: Jacob RATHBONEMRN: [id number] of Birth: 812/30/1957

## 2016-05-09 NOTE — Therapy (Signed)
[place], [place], [address] Phone: [phone]   Fax:  [phone]  Occupational Therapy Treatment  Patient Details  Name: Jacob Hunter MRN: [id number] Date of Birth: 10-24-1955 Referring Provider: Dr. Asencion Noble  Encounter Date: 05/09/2016      OT End of Session - 05/09/16 1138    Visit Number 14   Number of Visits 16   Date for OT Re-Evaluation 05/23/16   Authorization Type [employer]   Authorization Time Period Visit limit 60 - 0 used   Authorization - Visit Number 14   Authorization - Number of Visits 17   OT Start Time 0945   OT Stop Time 1030   OT Time Calculation (min) 45 min   Activity Tolerance Patient tolerated treatment well   Behavior During Therapy [facility] for tasks assessed/performed      Past Medical History:  Diagnosis Date  . Arthritis   . Hypercholesteremia   . Knee pain   . Necrotizing myopathy   . Polymyositis [facility])     Past Surgical History:  Procedure Laterality Date  . ANKLE SURGERY Left   . KNEE SURGERY Left     There were no vitals filed for this visit.      Subjective Assessment - 05/09/16 1131    Subjective  S: I was working on my sit to stands yesterday. No one was home and I was able to figue out how to stand up in the pantry to get what I needed.    Currently in Pain? No/denies                      OT Treatments/Exercises (OP) - 05/09/16 1132      Exercises   Exercises Shoulder     Shoulder Exercises: Seated   Other Seated Exercises Using green therapy ball (65 cm): protraction, flexion, and wood chops (12X)     Neurological Re-education Exercises   Weight Bearing Position Quadraped   Other Weight-Bearing Exercises 1 In quadraped: Bird Dog modified with alternating leg extention (10 total)   Trunk Exercises Core Activation   Trunk Core Activation Core strengthening completed supine on mat table including: Bed bug with alternating  legs (10 total), side to side knee bent (10 total), Bridges (10 total), Oblique side to side crunch (10 each side), rope climbs (10 each arm).      Weight Bearing Technique   Weight Bearing Technique Yes                  OT Short Term Goals - 04/25/16 1807      OT SHORT TERM GOAL #1   Title Patient will be educated and independent with HEP to increase functional performance during daily tasks.    Time 4   Period Weeks     OT SHORT TERM GOAL #2   Title Patient will increase BUE shoulder and elbow strength to 4/5 to increase ability to complete lightweight lifting activities.   Time 4   Period Weeks   Status On-going     OT SHORT TERM GOAL #3   Title Patient will increase overal activity tolerance by completing a simple household task for 10-15 minutes without increased fatigue.    Time 4   Period Weeks     OT SHORT TERM GOAL #4   Title Patient will increase overall core strength to increase ability to complete sit to stands with less difficulty.  Time 4   Period Weeks   Status Achieved           OT Long Term Goals - 04/23/16 1136      OT LONG TERM GOAL #1   Title Pt will return to prior level of independence and functioning during daily and work tasks.    Time 8   Period Weeks   Status On-going     OT LONG TERM GOAL #2   Title Patient will increase BUE shoulder and elbow strength to 4+/5 or greater to increase ability to complete work related tasks as well as be able to get up from off the floor as needed.   Time 8   Period Weeks   Status On-going     OT LONG TERM GOAL #3   Title Patient will increase overal activity tolerance by completing a simple household task for 30-35 minutes without increased fatigue.    Time 8   Period Weeks   Status On-going     OT LONG TERM GOAL #4   Title Patient will increase BUE grip strength to within average norms for age in order to be able to open tight lids or containers with less difficulty.    Time 8   Period  Weeks   Status On-going               Plan - 05/09/16 1139    Clinical Impression Statement A: Core strengthening completed today to work on increasing ability to perform sit to stands when baking in the kitchen. Pt required modification as needed. VC needed only intially for form and technique. Increased difficulty transitioning into quadraped from prone on stomach. At several attempts, patient was able to complete by transitioning from standing to quadraped using raised mat table.   Plan P: Continue to work on sit to stands with a slow controlled descent. Functioning standing and reaching task.      Patient will benefit from skilled therapeutic intervention in order to improve the following deficits and impairments:  Decreased endurance, Decreased activity tolerance, Decreased strength  Visit Diagnosis: Other symptoms and signs involving the musculoskeletal system    Problem List Patient Active Problem List   Diagnosis Date Noted  . Bilateral leg weakness 01/24/2015  . Elevated CPK 01/24/2015  . Transaminitis 10/24/2014   Ailene Ravel, OTR/L,CBIS  [phone]  05/09/2016, 11:42 AM  [place] [address] [place], [place], [address] Phone: [phone]   Fax:  [phone]  Name: Jacob Hunter MRN: [id number] Date of Birth: September 02, 1955

## 2016-05-10 ENCOUNTER — Telehealth (HOSPITAL_COMMUNITY): Payer: Self-pay | Admitting: Physical Therapy

## 2016-05-10 NOTE — Telephone Encounter (Signed)
cx Cr covers Acute for Jacob Hunter. Pt ok with cx-lation

## 2016-05-13 ENCOUNTER — Telehealth (HOSPITAL_COMMUNITY): Payer: Self-pay

## 2016-05-13 NOTE — Telephone Encounter (Signed)
05/13/16 he called to cx but no reason was given

## 2016-05-14 ENCOUNTER — Ambulatory Visit (HOSPITAL_COMMUNITY): Payer: 59

## 2016-05-14 ENCOUNTER — Ambulatory Visit (HOSPITAL_COMMUNITY): Payer: 59 | Admitting: Physical Therapy

## 2016-05-16 ENCOUNTER — Ambulatory Visit (HOSPITAL_COMMUNITY): Payer: 59 | Admitting: Physical Therapy

## 2016-05-17 ENCOUNTER — Ambulatory Visit (HOSPITAL_COMMUNITY): Payer: 59 | Attending: Internal Medicine | Admitting: Occupational Therapy

## 2016-05-17 ENCOUNTER — Ambulatory Visit (HOSPITAL_COMMUNITY): Payer: 59 | Admitting: Physical Therapy

## 2016-05-17 ENCOUNTER — Encounter (HOSPITAL_COMMUNITY): Payer: Self-pay | Admitting: Occupational Therapy

## 2016-05-17 DIAGNOSIS — Z789 Other specified health status: Secondary | ICD-10-CM | POA: Insufficient documentation

## 2016-05-17 DIAGNOSIS — R2681 Unsteadiness on feet: Secondary | ICD-10-CM

## 2016-05-17 DIAGNOSIS — M6281 Muscle weakness (generalized): Secondary | ICD-10-CM | POA: Insufficient documentation

## 2016-05-17 DIAGNOSIS — R29898 Other symptoms and signs involving the musculoskeletal system: Secondary | ICD-10-CM | POA: Insufficient documentation

## 2016-05-17 DIAGNOSIS — R262 Difficulty in walking, not elsewhere classified: Secondary | ICD-10-CM | POA: Insufficient documentation

## 2016-05-17 NOTE — Therapy (Signed)
[place], [place], [address] Phone: [phone]   Fax:  [phone]  Physical Therapy Treatment  Patient Details  Name: Jacob Hunter MRN: [id number] Date of Birth: [birth year] Referring Provider: Dr. Asencion Noble   Encounter Date: 05/17/2016      PT End of Session - 05/17/16 1013    Visit Number 16   Number of Visits 32   Date for PT Re-Evaluation 06/16/16   Authorization Type [employer] Plus (60 visits between PT/OT/speech)   Authorization Time Period 03/18/16 to 05/18/16   PT Start Time 0900   PT Stop Time 1025   PT Time Calculation (min) 85 min   Equipment Utilized During Treatment Gait belt   Activity Tolerance Patient tolerated treatment well;Patient limited by fatigue   Behavior During Therapy [facility] for tasks assessed/performed      Past Medical History:  Diagnosis Date  . Arthritis   . Hypercholesteremia   . Knee pain   . Necrotizing myopathy   . Polymyositis [facility])     Past Surgical History:  Procedure Laterality Date  . ANKLE SURGERY Left   . KNEE SURGERY Left     There were no vitals filed for this visit.      Subjective Assessment - 05/17/16 0934    Subjective Jacob Hunter states that he is still having a difficult time raising his leg.  He has noted that his core is stronger.  He is still having difficulty with prolong walking, sit to stand and he knows that his core is weak.  Goal is to get off the floor    Pertinent History history of L knee arthroscopic surgery, history of L ankle surgery, no other signifcant PMH    How long can you sit comfortably? no problem    How long can you stand comfortably? 5-10 minutes    How long can you walk comfortably? about 339 feet or less than five minutes    Patient Stated Goals improve mobilty, get stronger    Currently in Pain? No/denies            [facility] PT Assessment - 05/17/16 0001      Assessment   Medical Diagnosis  neuromuscular disorder    Referring Provider Dr. Asencion Noble    Onset Date/Surgical Date --  flare up January 2017   Next MD Visit Dr. Willey Blade no follow up, Dr. Tillman Abide in December    Prior Therapy OP      Precautions   Precaution Comments CAN ONLY GO TO 50-60% OF MAX RIGHT NOW      Prior Function   Level of Independence Independent;Independent with basic ADLs;Independent with gait;Independent with transfers   Vocation On disability;Other (comment);Full time employment  short term disability   Vocation Requirements on short term disability now, usually has 10 hour shifts at work     Observation/Other Assessments   Observations very easily fatigued      Strength   Right Hip Flexion 2/5   Right Hip Extension 2/5  was 3-/5 but tested prone not supine     Right Hip Internal Rotation 4/5   Right Hip ABduction 2+/5  was 2/5    Left Hip Flexion 2+/5  was 2/5    Left Hip Extension 2-/5  was 2/5   Left Hip ABduction 2+/5  was 2-/5    Right Knee Flexion 3/5  was 4- but tested prone today vs. sitting    Right Knee Extension  5/5  was 4+/ 5   Left Knee Flexion 3/5  was 4-/5 but testing today done prone vs. sitting.    Left Knee Extension 4/5   Right Ankle Dorsiflexion 5/5  was 4+/5   Left Ankle Dorsiflexion 5/5  was 4/5      Ambulation/Gait   Gait Comments 335 ft with walker   gait mechanics worsen with fatigue      High Level Balance   High Level Balance Comments unsupported standing x60 seconds (stopped by PT); tandem stance max 3 seconds each LE; semi-supported SLS (intermittent toe touch/UE support) 7 second max R, 1 second max L LE)                     [facility] Adult PT Treatment/Exercise - 05/17/16 0001      Bed Mobility   Rolling Right --   Rolling Left --   Supine to Sit --  poor mechanics    Sit to Supine --     Transfers   Transfers --   Sit to Stand --   Stand to Sit --   Lateral/Scoot Transfers --     Ambulation/Gait   Gait Pattern --      Exercises   Exercises Lumbar     Lumbar Exercises: Quadruped   Madcat/Old Horse 5 reps   Madcat/Old Horse Limitations limit quadricep position to 30 seconds    Other Quadruped Lumbar Exercises tall kneeling; (to difficult for pt at this time.)     Knee/Hip Exercises: Sidelying   Hip ABduction Strengthening;Both;10 reps     Knee/Hip Exercises: Prone   Hamstring Curl 10 reps                  PT Short Term Goals - 05/17/16 1024      PT SHORT TERM GOAL #1   Title Patient to be independent in all bed mobility in order to improve function and QOL at home    Baseline 9/5- reports he has to do bed mobilty on his own at home    Time 4   Period Weeks   Status Achieved     PT SHORT TERM GOAL #2   Title Patient to be able to tolerate at least 5 minutes of unsupported standing with RPE no more than 5/10 in order to demonstrate improved functional activity tolerance and balance    Baseline 9/5- reports able to perform ADLS unsupported at home   Time 4   Period Weeks   Status Achieved     PT SHORT TERM GOAL #3   Title Patient to be able to ambulate at least 425f with LRAD and Mod(I), RPE no more than 5/10 in order to demonstrate improved mobility and QOL    Baseline 9/5- 452 with walker, high fatigue    Time 4   Period Weeks   Status Partially Met     PT SHORT TERM GOAL #4   Title Patient to be able to perform stand-pivot transfers with LRAD on a Mod(I) basis in order to improve QOL and function at home    Period Weeks   Status Achieved     PT SHORT TERM GOAL #5   Title Patient to be indepedent in correctly and consistently performing appropriate HEP, to be updated PRN   Baseline 9/5- reports compliance    Time 4   Period Weeks   Status Achieved           PT Long Term Goals - 05/17/16  [place] #1   Title Patient to demonstrate proximal muscle strength at least 4/5 in order to assist in improving balance and tolerance to closed chain activities  such as gait    Time 8   Period Weeks   Status On-going     PT LONG TERM GOAL #2   Title Patient to be able to tolerate 15 minutes of unsupported standing in order to demonstrate improved functional activity tolernace and strength, assist in self care based activities    Time 8   Period Weeks   Status On-going     PT LONG TERM GOAL #3   Title Patient to be able to ambulate at least 1033f with LRAD, RPE no more than 5/10, with Mod(I) in order to improve general mobility and allow patient to access community    Time 8   Period Weeks   Status On-going     PT LONG TERM GOAL #4   Title Patient to be able to hold unsupported tandem stance at least 60 seconds B and SLS at least 30 seconds B in order to demonstrate improved balance and reduced fall risk    Time 8   Period Weeks   Status On-going     PT LONG TERM GOAL #5   Title Patient to be educated on benefits of and participatory in water exercise program at least 2 times per week in order to maintain functional gains and promote regular activity within safe and well tolerated environment   Time 8   Period Weeks   Status On-going               Plan - 05/17/16 1015    Clinical Impression Statement Pt reassessed today.  Pt has improved in all muscle groups except for hamstrings and hip extensors.  These muscles have most likely improved but were measured prone today vs sitting at evaluation.  Attempted tall kneeling activity but this causes pt to work beyond his 50-60 % maximum precaution.   May work pt in crawling position for 20 to 30 seconds at a time with rest in between .  Pt is still limited in bed mobility, decreased balance as well as diffculty walking.  Pt will continue to benefit from skilled PT to work on these issues.    Rehab Potential Good   Clinical Impairments Affecting Rehab Potential chronic progressive neuromuscular disease    PT Frequency 2x / week   PT Duration Other (comment)  total of 16 weeks    PT  Treatment/Interventions ADLs/Self Care Home Management;Biofeedback;Cryotherapy;Moist Heat;DME Instruction;Gait training;Stair training;Functional mobility training;Therapeutic activities;Therapeutic exercise;Balance training;Neuromuscular re-education;Patient/family education;Manual techniques;Energy conservation;Taping   PT Next Visit Plan Max test on Cybex so we can exercise at 50%.  Continue to work on quadriped exercises for short times but do not try crawling as this is to difficult at this time.  Continue to work on Core and LE exercises without going over 50-60% of maximum effort.    PT Home Exercise Plan 9/19: HHPT program, added bridges, toe/heel raises, seated hip ABD with red band on 9/19 ; given S.A.F.E. abdominal strengthening as well as diaphragmic breathing exercises.     Consulted and Agree with Plan of Care Patient      Patient will benefit from skilled therapeutic intervention in order to improve the following deficits and impairments:  Abnormal gait, Improper body mechanics, Cardiopulmonary status limiting activity, Decreased coordination, Decreased mobility, Postural dysfunction, Decreased activity tolerance, Decreased endurance,  Decreased strength, Decreased balance, Difficulty walking, Impaired flexibility  Visit Diagnosis: Other symptoms and signs involving the musculoskeletal system  Difficulty in walking, not elsewhere classified  Unsteadiness on feet  Muscle weakness (generalized)  Decreased activities of daily living (ADL)  Unsteady gait     Problem List Patient Active Problem List   Diagnosis Date Noted  . Bilateral leg weakness 01/24/2015  . Elevated CPK 01/24/2015  . Transaminitis 10/24/2014    Rayetta Humphrey, PT CLT [phone] 05/17/2016, 10:30 AM  [place] [address] [place], [place], [address] Phone: [phone]   Fax:  [phone]  Name: Jacob Hunter MRN: [id number] Date of Birth:  August 05, 1956

## 2016-05-17 NOTE — Therapy (Signed)
[place], [place], [address] Phone: [phone]   Fax:  [phone]  Occupational Therapy Treatment  Patient Details  Name: Jacob Hunter MRN: [id number] Date of Birth: November 21, 1955 Referring Provider: Dr. Asencion Noble  Encounter Date: 05/17/2016      OT End of Session - 05/17/16 1219    Visit Number 15   Number of Visits 16   Date for OT Re-Evaluation 05/23/16   Authorization Type [employer]   Authorization Time Period Visit limit 60 - 0 used   Authorization - Visit Number 15   Authorization - Number of Visits 16   OT Start Time 5027   OT Stop Time 1115   OT Time Calculation (min) 42 min   Activity Tolerance Patient tolerated treatment well   Behavior During Therapy [facility] for tasks assessed/performed      Past Medical History:  Diagnosis Date  . Arthritis   . Hypercholesteremia   . Knee pain   . Necrotizing myopathy   . Polymyositis [facility])     Past Surgical History:  Procedure Laterality Date  . ANKLE SURGERY Left   . KNEE SURGERY Left     There were no vitals filed for this visit.      Subjective Assessment - 05/17/16 1036    Subjective  S: I was sore for a day after last session.    Currently in Pain? No/denies            [facility] OT Assessment - 05/17/16 1036      Assessment   Diagnosis BUE weakness     Precautions   Precautions Other (comment)   Precaution Comments CAN ONLY GO TO 50-60% OF MAX RIGHT NOW                   OT Treatments/Exercises (OP) - 05/17/16 1212      Exercises   Exercises Shoulder     Shoulder Exercises: Seated   Horizontal ABduction Strengthening;10 reps   Horizontal ABduction Weight (lbs) 2   Flexion Strengthening;10 reps   Flexion Weight (lbs) 2   Other Seated Exercises Using green therapy ball (65 cm):  wood chops (12X)     Neurological Re-education Exercises   Other Exercises 1 Pt completed 7 sit to stands focusing on core  activation and slow and controlled decent. Pt used core and leg strength through exercise, no hands. OT providing supervision/min guard. Pt with improved control on decent.    Weight Bearing Position Quadraped   Other Weight-Bearing Exercises 1 In quadraped: Bird Dog modified with alternating leg extention (10 total)   Trunk Exercises Core Activation   Trunk Core Activation Core strengthening: supine on mat table-Bed bug with alternating legs (10 total); seated on mat table-seated abdominal twist with 2# weight (10 total),  rope climbs (10 each arm), Pallof press with green theraband (10X each direction)                   OT Short Term Goals - 04/25/16 1807      OT SHORT TERM GOAL #1   Title Patient will be educated and independent with HEP to increase functional performance during daily tasks.    Time 4   Period Weeks     OT SHORT TERM GOAL #2   Title Patient will increase BUE shoulder and elbow strength to 4/5 to increase ability to complete lightweight lifting activities.   Time 4   Period Weeks  Status On-going     OT SHORT TERM GOAL #3   Title Patient will increase overal activity tolerance by completing a simple household task for 10-15 minutes without increased fatigue.    Time 4   Period Weeks     OT SHORT TERM GOAL #4   Title Patient will increase overall core strength to increase ability to complete sit to stands with less difficulty.     Time 4   Period Weeks   Status Achieved           OT Long Term Goals - 04/23/16 1136      OT LONG TERM GOAL #1   Title Pt will return to prior level of independence and functioning during daily and work tasks.    Time 8   Period Weeks   Status On-going     OT LONG TERM GOAL #2   Title Patient will increase BUE shoulder and elbow strength to 4+/5 or greater to increase ability to complete work related tasks as well as be able to get up from off the floor as needed.   Time 8   Period Weeks   Status On-going      OT LONG TERM GOAL #3   Title Patient will increase overal activity tolerance by completing a simple household task for 30-35 minutes without increased fatigue.    Time 8   Period Weeks   Status On-going     OT LONG TERM GOAL #4   Title Patient will increase BUE grip strength to within average norms for age in order to be able to open tight lids or containers with less difficulty.    Time 8   Period Weeks   Status On-going               Plan - 05/17/16 1219    Clinical Impression Statement A: Continued with core strengthening this session, also focused on sit to stand exercises working to control decent. Pt reports improvements in sit to stands when at home, continues to have difficulty with surfaces lower than his wheelchair. Pt able to perform bed bug exercise without using arms as assist to legs.    Plan P: Reassessment. Functional standing and reaching task.       Patient will benefit from skilled therapeutic intervention in order to improve the following deficits and impairments:  Decreased endurance, Decreased activity tolerance, Decreased strength  Visit Diagnosis: Other symptoms and signs involving the musculoskeletal system    Problem List Patient Active Problem List   Diagnosis Date Noted  . Bilateral leg weakness 01/24/2015  . Elevated CPK 01/24/2015  . Transaminitis 10/24/2014   Guadelupe Sabin, OTR/L  [phone] 05/17/2016, 12:22 PM  [place] [address] [place], [place], [address] Phone: [phone]   Fax:  [phone]  Name: WENTWORTH EDELEN MRN: [id number] Date of Birth: [birth year]

## 2016-05-21 ENCOUNTER — Ambulatory Visit (HOSPITAL_COMMUNITY): Payer: 59

## 2016-05-21 ENCOUNTER — Encounter (HOSPITAL_COMMUNITY): Payer: 59

## 2016-05-23 ENCOUNTER — Ambulatory Visit (HOSPITAL_COMMUNITY): Payer: 59

## 2016-05-23 ENCOUNTER — Encounter (HOSPITAL_COMMUNITY): Payer: Self-pay

## 2016-05-23 DIAGNOSIS — M6281 Muscle weakness (generalized): Secondary | ICD-10-CM

## 2016-05-23 DIAGNOSIS — R2681 Unsteadiness on feet: Secondary | ICD-10-CM

## 2016-05-23 DIAGNOSIS — R29898 Other symptoms and signs involving the musculoskeletal system: Secondary | ICD-10-CM

## 2016-05-23 DIAGNOSIS — R262 Difficulty in walking, not elsewhere classified: Secondary | ICD-10-CM

## 2016-05-23 NOTE — Therapy (Signed)
[place], [place], [address] Phone: [phone]   Fax:  [phone]  Occupational Therapy Treatment And reassessment/recert Patient Details  Name: Jacob Hunter MRN: [id number] Date of Birth: March 23, 1956 Referring Provider: Dr. Asencion Noble  Encounter Date: 05/23/2016      OT End of Session - 05/23/16 1146    Visit Number 16   Number of Visits 24   Date for OT Re-Evaluation 06/22/16   Authorization Type [employer]   Authorization Time Period Visit limit 60 - 0 used   Authorization - Visit Number 16   Authorization - Number of Visits 60   OT Start Time 1120   OT Stop Time 1200   OT Time Calculation (min) 40 min   Activity Tolerance Patient tolerated treatment well   Behavior During Therapy [facility] for tasks assessed/performed      Past Medical History:  Diagnosis Date  . Arthritis   . Hypercholesteremia   . Knee pain   . Necrotizing myopathy   . Polymyositis [facility])     Past Surgical History:  Procedure Laterality Date  . ANKLE SURGERY Left   . KNEE SURGERY Left     There were no vitals filed for this visit.      Subjective Assessment - 05/23/16 1142    Subjective  S: I am doing the sit to stands much easier now.   Currently in Pain? No/denies            [facility] OT Assessment - 05/23/16 1122      Assessment   Diagnosis BUE weakness   Onset Date --  Nov. 2015     Precautions   Precautions Other (comment)   Precaution Comments CAN ONLY GO TO 50-60% OF MAX RIGHT NOW      ROM / Strength   AROM / PROM / Strength Strength     Strength   Right/Left Shoulder Right;Left   Right Shoulder Flexion 4/5  previous: 4-/5   Right Shoulder ABduction 4-/5  previous: same   Right Shoulder Internal Rotation 4-/5  previous: same   Right Shoulder External Rotation 3+/5  previous: same   Right Shoulder Horizontal ABduction 4-/5  previous: 3+/5   Right Shoulder Horizontal ADduction 4-/5   previous: 3+/5   Left Shoulder Flexion 4-/5  previous: 3+/5   Left Shoulder ABduction 4-/5  previous: 3+/5   Left Shoulder Internal Rotation 4+/5  previous: same   Left Shoulder External Rotation 3+/5  previous: 3/5   Left Shoulder Horizontal ABduction --  4-/5 (previous: 3+/5)   Left Shoulder Horizontal ADduction 4-/5  previous: 3+/5   Right Hand Grip (lbs) 87  previous: 83   Left Hand Grip (lbs) 90  previous: 85                  OT Treatments/Exercises (OP) - 05/23/16 1133      Exercises   Exercises Shoulder     Shoulder Exercises: Seated   Extension Strengthening;20 reps  Cybex machine; 6 plate   Protraction [id number];56 reps   Theraband Level (Shoulder Protraction) Level 3 (Green)   Horizontal ABduction Theraband;10 reps   Theraband Level (Shoulder Horizontal ABduction) Level 3 (Green)   External Rotation Theraband;10 reps   Theraband Level (Shoulder External Rotation) Level 3 (Green)   Internal Rotation Theraband;10 reps   Theraband Level (Shoulder Internal Rotation) Level 3 (Green)     Shoulder Exercises: ROM/Strengthening   Cybex Press 2 plate;20 reps   Cybex  Row 2 plate;20 reps                  OT Short Term Goals - 05/23/16 1131      OT SHORT TERM GOAL #1   Title Patient will be educated and independent with HEP to increase functional performance during daily tasks.    Time 4   Period Weeks     OT SHORT TERM GOAL #2   Title Patient will increase BUE shoulder and elbow strength to 4/5 to increase ability to complete lightweight lifting activities.   Time 4   Period Weeks   Status Partially Met     OT SHORT TERM GOAL #3   Title Patient will increase overal activity tolerance by completing a simple household task for 10-15 minutes without increased fatigue.    Time 4   Period Weeks     OT SHORT TERM GOAL #4   Title Patient will increase overall core strength to increase ability to complete sit to stands with less difficulty.      Time 4   Period Weeks           OT Long Term Goals - 05/23/16 1131      OT LONG TERM GOAL #1   Title Pt will return to prior level of independence and functioning during daily and work tasks.    Time 8   Period Weeks   Status On-going     OT LONG TERM GOAL #2   Title Patient will increase BUE shoulder and elbow strength to 4+/5 or greater to increase ability to complete work related tasks as well as be able to get up from off the floor as needed.   Time 8   Period Weeks   Status On-going     OT LONG TERM GOAL #3   Title Patient will increase overal activity tolerance by completing a simple household task for 30-35 minutes without increased fatigue.    Time 8   Period Weeks   Status On-going     OT LONG TERM GOAL #4   Title Patient will increase BUE grip strength to within average norms for age in order to be able to open tight lids or containers with less difficulty.    Time 8   Period Weeks   Status Achieved               Plan - 05/23/16 1245    Clinical Impression Statement A: Reassessment completed this date. Patient is making progress towards his therapy goals. He has met all short term goals with just one partially met. Pt reports that his endurance is improving although he quickly fatigues after 10 minutes of standing. BUE strength is improving although remains a deficit during functional tasks.    Plan P: Continue OT services 2x/week for 4 weeks to focus on the above deficits. Complete a functional standing and reaching task.   Consulted and Agree with Plan of Care Patient      Patient will benefit from skilled therapeutic intervention in order to improve the following deficits and impairments:  Decreased endurance, Decreased activity tolerance, Decreased strength  Visit Diagnosis: Other symptoms and signs involving the musculoskeletal system - Plan: Ot plan of care cert/re-cert    Problem List Patient Active Problem List   Diagnosis Date Noted  .  Bilateral leg weakness 01/24/2015  . Elevated CPK 01/24/2015  . Transaminitis 10/24/2014   Ailene Ravel, OTR/L,CBIS  [phone]  05/23/2016, 12:53 PM  [place]  [facility] [place], [place], [address] Phone: [phone]   Fax:  [phone]  Name: TAKEEM KROTZER MRN: [id number] Date of Birth: 1955/09/03

## 2016-05-28 ENCOUNTER — Ambulatory Visit (HOSPITAL_COMMUNITY): Payer: 59

## 2016-05-28 ENCOUNTER — Encounter (HOSPITAL_COMMUNITY): Payer: Self-pay

## 2016-05-28 DIAGNOSIS — R262 Difficulty in walking, not elsewhere classified: Secondary | ICD-10-CM

## 2016-05-28 DIAGNOSIS — R2681 Unsteadiness on feet: Secondary | ICD-10-CM

## 2016-05-28 DIAGNOSIS — R29898 Other symptoms and signs involving the musculoskeletal system: Secondary | ICD-10-CM

## 2016-05-28 DIAGNOSIS — M6281 Muscle weakness (generalized): Secondary | ICD-10-CM

## 2016-05-28 NOTE — Therapy (Signed)
[place], [place], [address] Phone: [phone]   Fax:  [phone]  Occupational Therapy Treatment  Patient Details  Name: Jacob Hunter MRN: [id number] Date of Birth: 21-Dec-1955 Referring Provider: Dr. Asencion Noble  Encounter Date: 05/28/2016      OT End of Session - 05/28/16 1437    Visit Number 17   Number of Visits 24   Date for OT Re-Evaluation 06/22/16   Authorization Type [employer]   Authorization Time Period Visit limit 60 - 0 used   Authorization - Visit Number 17   Authorization - Number of Visits 103   OT Start Time 1103   OT Stop Time 1515   OT Time Calculation (min) 43 min   Activity Tolerance Patient tolerated treatment well   Behavior During Therapy [facility] for tasks assessed/performed      Past Medical History:  Diagnosis Date  . Arthritis   . Hypercholesteremia   . Knee pain   . Necrotizing myopathy   . Polymyositis [facility])     Past Surgical History:  Procedure Laterality Date  . ANKLE SURGERY Left   . KNEE SURGERY Left     There were no vitals filed for this visit.      Subjective Assessment - 05/28/16 1435    Subjective  S: I've been using 2# weights at home. They feel pretty light.   Currently in Pain? No/denies            [facility] OT Assessment - 05/28/16 1436      Assessment   Diagnosis BUE weakness     Precautions   Precautions Other (comment)   Precaution Comments CAN ONLY GO TO 50-60% OF MAX RIGHT NOW                   OT Treatments/Exercises (OP) - 05/28/16 1436      Exercises   Exercises Shoulder;Elbow     Shoulder Exercises: Seated   Extension Strengthening;20 reps  Cybex machine 6 plate   Protraction [id number];62 reps   Protraction Weight (lbs) 3   Horizontal ABduction Strengthening;12 reps   Horizontal ABduction Weight (lbs) 3   External Rotation Strengthening;12 reps  shoulder abducted   External Rotation Weight (lbs) 3   Internal Rotation Strengthening;12 reps  shoulder abducted   Internal Rotation Weight (lbs) 3   Flexion Strengthening;12 reps   Flexion Weight (lbs) 3   Abduction Strengthening;12 reps   ABduction Weight (lbs) 3   Other Seated Exercises Overhead Press; 3#; 12X     Shoulder Exercises: ROM/Strengthening   Cybex Press 2 plate;20 reps   Cybex Row 2 plate;20 reps     Elbow Exercises   Other elbow exercises Tricep Extensions holding 3# handweight with bilateral hands; 12X;   Other elbow exercises Tricep dips off mat table with 14 inch with blue 2.5 inch foam 2 sets 5 reps                  OT Short Term Goals - 05/23/16 1131      OT SHORT TERM GOAL #1   Title Patient will be educated and independent with HEP to increase functional performance during daily tasks.    Time 4   Period Weeks     OT SHORT TERM GOAL #2   Title Patient will increase BUE shoulder and elbow strength to 4/5 to increase ability to complete lightweight lifting activities.   Time 4   Period Weeks  Status Partially Met     OT SHORT TERM GOAL #3   Title Patient will increase overal activity tolerance by completing a simple household task for 10-15 minutes without increased fatigue.    Time 4   Period Weeks     OT SHORT TERM GOAL #4   Title Patient will increase overall core strength to increase ability to complete sit to stands with less difficulty.     Time 4   Period Weeks           OT Long Term Goals - 05/28/16 1655      OT LONG TERM GOAL #1   Title Pt will return to prior level of independence and functioning during daily and work tasks.    Time 8   Period Weeks   Status On-going     OT LONG TERM GOAL #2   Title Patient will increase BUE shoulder and elbow strength to 4+/5 or greater to increase ability to complete work related tasks as well as be able to get up from off the floor as needed.   Time 8   Period Weeks   Status On-going     OT LONG TERM GOAL #3   Title Patient will  increase overal activity tolerance by completing a simple household task for 30-35 minutes without increased fatigue.    Time 8   Period Weeks   Status On-going     OT LONG TERM GOAL #4   Title Patient will increase BUE grip strength to within average norms for age in order to be able to open tight lids or containers with less difficulty.    Time 8   Period Weeks               Plan - 05/28/16 1653    Clinical Impression Statement A: Session focused on UB strengthening as patient just finished PT session focusing on core and LB strengthening. Slight verbal and physical cueing need for form and technique.    Plan P: Continue with BUE strengthening.       Patient will benefit from skilled therapeutic intervention in order to improve the following deficits and impairments:  Decreased endurance, Decreased activity tolerance, Decreased strength  Visit Diagnosis: Other symptoms and signs involving the musculoskeletal system    Problem List Patient Active Problem List   Diagnosis Date Noted  . Bilateral leg weakness 01/24/2015  . Elevated CPK 01/24/2015  . Transaminitis 10/24/2014   Ailene Ravel, OTR/L,CBIS  [phone]  05/28/2016, 4:57 PM  [place] [address] [place], [place], [address] Phone: [phone]   Fax:  [phone]  Name: Jacob Hunter MRN: [id number] Date of Birth: 09-12-1955

## 2016-05-29 NOTE — Therapy (Signed)
[place], [place], [address] Phone: [phone]   Fax:  [phone]  Physical Therapy Treatment  Patient Details  Name: Jacob Hunter MRN: [id number] Date of Birth: October 25, 1955 Referring Provider: Dr. Asencion Noble  Encounter Date: 05/28/2016      PT End of Session - 05/29/16 1619    Visit Number 18   Number of Visits 32   Date for PT Re-Evaluation 06/16/16   Authorization Type [employer] (60 visits between PT/OT/speech)   Authorization Time Period 03/18/16 to 05/18/16; 05-18-11/01/2016   PT Start Time 1350   PT Stop Time 1430   PT Time Calculation (min) 40 min   Equipment Utilized During Treatment Gait belt   Activity Tolerance Patient tolerated treatment well   Behavior During Therapy [facility] for tasks assessed/performed      Past Medical History:  Diagnosis Date  . Arthritis   . Hypercholesteremia   . Knee pain   . Necrotizing myopathy   . Polymyositis [facility])     Past Surgical History:  Procedure Laterality Date  . ANKLE SURGERY Left   . KNEE SURGERY Left     There were no vitals filed for this visit.      Subjective Assessment - 05/29/16 1606    Subjective 2073 was the last check of enzymes.  next friday is next check, has reduced the prednisone to 40.  Just off rituxtin.  Has been doing a lot of walking this past weekend.  Went with the family to pick up a dog.     Pertinent History history of L knee arthroscopic surgery, history of L ankle surgery, no other signifcant PMH    How long can you sit comfortably? no problem    How long can you stand comfortably? 5-10 minutes    How long can you walk comfortably? about 339 feet or less than five minutes    Patient Stated Goals improve mobilty, get stronger             [facility] PT Assessment - 05/29/16 0001      Assessment   Medical Diagnosis neuromuscular disorder    Referring Provider Dr. Asencion Noble   Onset Date/Surgical Date  --  Flare up in Jan 2017   Next MD Visit Dr. Willey Blade no follow up, Dr. Tillman Abide in December    Prior Therapy OP      Precautions   Precautions Other (comment)   Precaution Comments CAN ONLY GO TO 50-60% OF MAX RIGHT NOW                      [facility] Adult PT Treatment/Exercise - 05/29/16 0001      Ambulation/Gait   Gait Comments 417f  with RW, narrow BOS.  Gait speed 2.125fsec     Lumbar Exercises: Sidelying   Clam 10 reps  x2 sets with assistance for L hip positioning.   Other Sidelying Lumbar Exercises Reverse clams 2 x 10reps     Lumbar Exercises: Quadruped   Opposite Arm/Leg Raise 5 reps  UE's only lifting ~1" from the mat     Knee/Hip Exercises: Seated   Long Arc Quad Strengthening;2 sets;10 reps  with 2# weight increased to 5# with 5 second hold.    Marching Limitations 2 x 10 reps with 5# and 5 sec hold                PT Education - 05/29/16 1618  Education provided Yes   Education Details Pt educated on importance of strengthening hip extensors/abductors, and core - how it translates to gait and functional mobiltiy.    Person(s) Educated Patient   Methods Explanation;Demonstration;Tactile cues;Verbal cues   Comprehension Verbalized understanding;Tactile cues required;Verbal cues required          PT Short Term Goals - 05/29/16 1627      PT SHORT TERM GOAL #1   Title Patient to be independent in all bed mobility in order to improve function and QOL at home    Baseline 9/5- reports he has to do bed mobilty on his own at home    Time 4   Period Weeks   Status Achieved     PT SHORT TERM GOAL #2   Title Patient to be able to tolerate at least 5 minutes of unsupported standing with RPE no more than 5/10 in order to demonstrate improved functional activity tolerance and balance    Baseline 9/5- reports able to perform ADLS unsupported at home   Time 4   Period Weeks   Status Achieved     PT SHORT TERM GOAL #3   Title Patient to be  able to ambulate at least 422f with LRAD and Mod(I), RPE no more than 5/10 in order to demonstrate improved mobility and QOL    Baseline 9/5- 452 with walker, high fatigue    Time 4   Period Weeks   Status Partially Met     PT SHORT TERM GOAL #4   Title Patient to be able to perform stand-pivot transfers with LRAD on a Mod(I) basis in order to improve QOL and function at home    Period Weeks   Status Achieved     PT SHORT TERM GOAL #5   Title Patient to be indepedent in correctly and consistently performing appropriate HEP, to be updated PRN   Baseline 9/5- reports compliance    Time 4   Period Weeks   Status Achieved           PT Long Term Goals - 05/29/16 1627      PT LONG TERM GOAL #1   Title Patient to demonstrate proximal muscle strength at least 4/5 in order to assist in improving balance and tolerance to closed chain activities such as gait    Time 8   Period Weeks   Status On-going     PT LONG TERM GOAL #2   Title Patient to be able to tolerate 15 minutes of unsupported standing in order to demonstrate improved functional activity tolernace and strength, assist in self care based activities    Time 8   Period Weeks   Status On-going     PT LONG TERM GOAL #3   Title Patient to be able to ambulate at least 10089fwith LRAD, RPE no more than 5/10, with Mod(I) in order to improve general mobility and allow patient to access community    Time 8   Period Weeks   Status On-going     PT LONG TERM GOAL #4   Title Patient to be able to hold unsupported tandem stance at least 60 seconds B and SLS at least 30 seconds B in order to demonstrate improved balance and reduced fall risk    Time 8   Period Weeks   Status On-going     PT LONG TERM GOAL #5   Title Patient to be educated on benefits of and participatory in water exercise program at least  2 times per week in order to maintain functional gains and promote regular activity within safe and well tolerated environment    Time 8   Period Weeks   Status On-going               Plan - 05/29/16 1620    Clinical Impression Statement Pt demonstrated improvement with gait distance, as well as gait speed, however he continues to have a narrow BOS and required 1 short standing rest break.  Pt continues to struggle with proprioception in LE's, as well as strength and demonstrates multiple compensation patterns to overcome these deficits.  Pt required at least 3 rest breaks due to fatigue during PT session today.   Will continue to progress pt's LE strength and integrate more quadruped and tall kneeling, and eventually standing exercises for function.     Rehab Potential Good   Clinical Impairments Affecting Rehab Potential chronic progressive neuromuscular disease    PT Frequency 2x / week   PT Duration Other (comment)  16 weeks   PT Treatment/Interventions ADLs/Self Care Home Management;Biofeedback;Cryotherapy;Moist Heat;DME Instruction;Gait training;Stair training;Functional mobility training;Therapeutic activities;Therapeutic exercise;Balance training;Neuromuscular re-education;Patient/family education;Manual techniques;Energy conservation;Taping   PT Next Visit Plan Max test on Cybex to exercise at 50%, weight shifting  and alternating UE in quadruped   PT Home Exercise Plan none added this tx   Consulted and Agree with Plan of Care Patient      Patient will benefit from skilled therapeutic intervention in order to improve the following deficits and impairments:  Abnormal gait, Improper body mechanics, Cardiopulmonary status limiting activity, Decreased coordination, Decreased mobility, Postural dysfunction, Decreased activity tolerance, Decreased endurance, Decreased strength, Decreased balance, Difficulty walking, Impaired flexibility  Visit Diagnosis: Other symptoms and signs involving the musculoskeletal system  Difficulty in walking, not elsewhere classified  Unsteadiness on feet  Muscle weakness  (generalized)  Unsteady gait  Muscle weakness of lower extremity     Problem List Patient Active Problem List   Diagnosis Date Noted  . Bilateral leg weakness 01/24/2015  . Elevated CPK 01/24/2015  . Transaminitis 10/24/2014    Beth Leston Schueller, PT, DPT X: [phone]   [facility] [address] [place], [place], [address] Phone: [phone]   Fax:  [phone]  Name: Jacob Hunter MRN: [id number] Date of Birth: 03/18/1956

## 2016-05-30 ENCOUNTER — Ambulatory Visit (HOSPITAL_COMMUNITY): Payer: 59 | Admitting: Physical Therapy

## 2016-05-30 ENCOUNTER — Ambulatory Visit (HOSPITAL_COMMUNITY): Payer: 59

## 2016-05-30 ENCOUNTER — Encounter (HOSPITAL_COMMUNITY): Payer: Self-pay

## 2016-05-30 DIAGNOSIS — R29898 Other symptoms and signs involving the musculoskeletal system: Secondary | ICD-10-CM | POA: Diagnosis not present

## 2016-05-30 DIAGNOSIS — R262 Difficulty in walking, not elsewhere classified: Secondary | ICD-10-CM

## 2016-05-30 DIAGNOSIS — M6281 Muscle weakness (generalized): Secondary | ICD-10-CM

## 2016-05-30 DIAGNOSIS — R2681 Unsteadiness on feet: Secondary | ICD-10-CM

## 2016-05-30 NOTE — Therapy (Signed)
[place], [place], [address] Phone: [phone]   Fax:  [phone]  Physical Therapy Treatment  Patient Details  Name: Jacob Hunter MRN: [id number] Date of Birth: [birth year] Referring Provider: Dr. Asencion Noble  Encounter Date: 05/30/2016      PT End of Session - 05/30/16 1608    Visit Number 19   Number of Visits 32   Date for PT Re-Evaluation 06/16/16   Authorization Type [employer] (60 visits between PT/OT/speech)   Authorization Time Period 03/18/16 to 05/18/16; 05-18-11/01/2016   PT Start Time 1513   PT Stop Time 1559   PT Time Calculation (min) 46 min   Equipment Utilized During Treatment Gait belt   Activity Tolerance Patient tolerated treatment well;Patient limited by fatigue   Behavior During Therapy [facility] for tasks assessed/performed      Past Medical History:  Diagnosis Date  . Arthritis   . Hypercholesteremia   . Knee pain   . Necrotizing myopathy   . Polymyositis [facility])     Past Surgical History:  Procedure Laterality Date  . ANKLE SURGERY Left   . KNEE SURGERY Left     There were no vitals filed for this visit.      Subjective Assessment - 05/30/16 1614    Subjective Patient reports that he is doing well today, he is going for another enzyme check next Friday and has been trying to do more around his home with his family guarding him.    Pertinent History history of L knee arthroscopic surgery, history of L ankle surgery, no other signifcant PMH    Currently in Pain? No/denies                         [facility] Adult PT Treatment/Exercise - 05/30/16 0001      Ambulation/Gait   Gait Comments 459f walker, WC follow, limited by fatigue      Lumbar Exercises: Quadruped   Other Quadruped Lumbar Exercises tall kneel for approximately 15 seconds, severe fatigue      Knee/Hip Exercises: Supine   Short Arc Quad Sets Both;1 set;15 reps   Short Arc Quad  Sets Limitations 2#    BResearch scientist (life sciences)Limitations sets of 7 reps, 3 second holds    Straight Leg Raises Both;2 sets;10 reps     Knee/Hip Exercises: Sidelying   Clams 2x15 red TB                 PT Education - 05/30/16 1607    Education provided Yes   Education Details clinical reasoning and relation of proximal/core strengthening and how DPTs use physical performance to gauge readiness for progression to next activity/difficulty of task    Person(s) Educated Patient   Methods Explanation   Comprehension Verbalized understanding          PT Short Term Goals - 05/29/16 1627      PT SHORT TERM GOAL #1   Title Patient to be independent in all bed mobility in order to improve function and QOL at home    Baseline 9/5- reports he has to do bed mobilty on his own at home    Time 4   Period Weeks   Status Achieved     PT SHORT TERM GOAL #2   Title Patient to be able to tolerate at least 5 minutes of unsupported standing with RPE no more than 5/10 in  order to demonstrate improved functional activity tolerance and balance    Baseline 9/5- reports able to perform ADLS unsupported at home   Time 4   Period Weeks   Status Achieved     PT SHORT TERM GOAL #3   Title Patient to be able to ambulate at least 445f with LRAD and Mod(I), RPE no more than 5/10 in order to demonstrate improved mobility and QOL    Baseline 9/5- 452 with walker, high fatigue    Time 4   Period Weeks   Status Partially Met     PT SHORT TERM GOAL #4   Title Patient to be able to perform stand-pivot transfers with LRAD on a Mod(I) basis in order to improve QOL and function at home    Period Weeks   Status Achieved     PT SHORT TERM GOAL #5   Title Patient to be indepedent in correctly and consistently performing appropriate HEP, to be updated PRN   Baseline 9/5- reports compliance    Time 4   Period Weeks   Status Achieved           PT Long Term Goals - 05/29/16 1627      PT  LONG TERM GOAL #1   Title Patient to demonstrate proximal muscle strength at least 4/5 in order to assist in improving balance and tolerance to closed chain activities such as gait    Time 8   Period Weeks   Status On-going     PT LONG TERM GOAL #2   Title Patient to be able to tolerate 15 minutes of unsupported standing in order to demonstrate improved functional activity tolernace and strength, assist in self care based activities    Time 8   Period Weeks   Status On-going     PT LONG TERM GOAL #3   Title Patient to be able to ambulate at least 10028fwith LRAD, RPE no more than 5/10, with Mod(I) in order to improve general mobility and allow patient to access community    Time 8   Period Weeks   Status On-going     PT LONG TERM GOAL #4   Title Patient to be able to hold unsupported tandem stance at least 60 seconds B and SLS at least 30 seconds B in order to demonstrate improved balance and reduced fall risk    Time 8   Period Weeks   Status On-going     PT LONG TERM GOAL #5   Title Patient to be educated on benefits of and participatory in water exercise program at least 2 times per week in order to maintain functional gains and promote regular activity within safe and well tolerated environment   Time 8   Period Weeks   Status On-going               Plan - 05/30/16 1609    Clinical Impression Statement Began session with gait approximately 45226fith walker and one rest break; also continued with functional core and proximal strengthening today, noting generally improving performance but ongoing difficulty due to generalized weakness secondary to chronic neuromuscular condition. Attempted tall kneeling, and patient able to maintain position only for 10-20 seconds before severe fatigue. Education provided regarding role of core/proximal strength and quadruped activities in relation to progression to more intense standing tasks.    Rehab Potential Good   Clinical  Impairments Affecting Rehab Potential chronic progressive neuromuscular disease    PT Frequency 2x / week  PT Duration Other (comment)  16 weeks    PT Treatment/Interventions ADLs/Self Care Home Management;Biofeedback;Cryotherapy;Moist Heat;DME Instruction;Gait training;Stair training;Functional mobility training;Therapeutic activities;Therapeutic exercise;Balance training;Neuromuscular re-education;Patient/family education;Manual techniques;Energy conservation;Taping   PT Next Visit Plan continue strength training at 50% on cybex machines, proximal strength, quadruped tasks    PT Home Exercise Plan none added this tx   Consulted and Agree with Plan of Care Patient      Patient will benefit from skilled therapeutic intervention in order to improve the following deficits and impairments:  Abnormal gait, Improper body mechanics, Cardiopulmonary status limiting activity, Decreased coordination, Decreased mobility, Postural dysfunction, Decreased activity tolerance, Decreased endurance, Decreased strength, Decreased balance, Difficulty walking, Impaired flexibility  Visit Diagnosis: Other symptoms and signs involving the musculoskeletal system  Difficulty in walking, not elsewhere classified  Unsteadiness on feet  Muscle weakness (generalized)     Problem List Patient Active Problem List   Diagnosis Date Noted  . Bilateral leg weakness 01/24/2015  . Elevated CPK 01/24/2015  . Transaminitis 10/24/2014    Deniece Ree PT, DPT [place] [address] [place], [place], [address] Phone: [phone]   Fax:  [phone]  Name: Jacob Hunter MRN: [id number] Date of Birth: 22-Jun-1956

## 2016-05-31 NOTE — Therapy (Signed)
[place], [place], [address] Phone: [phone]   Fax:  [phone]  Occupational Therapy Treatment  Patient Details  Name: Jacob Hunter MRN: [id number] Date of Birth: 08-06-1956 Referring Provider: Dr. Asencion Noble  Encounter Date: 05/30/2016      OT End of Session - 05/31/16 0832    Visit Number 18   Number of Visits 24   Date for OT Re-Evaluation 06/22/16   Authorization Type [employer]   Authorization Time Period Visit limit 60 - 0 used   Authorization - Visit Number 18   Authorization - Number of Visits 60   OT Start Time 6568   OT Stop Time 1700   OT Time Calculation (min) 46 min   Activity Tolerance Patient tolerated treatment well   Behavior During Therapy [facility] for tasks assessed/performed      Past Medical History:  Diagnosis Date  . Arthritis   . Hypercholesteremia   . Knee pain   . Necrotizing myopathy   . Polymyositis [facility])     Past Surgical History:  Procedure Laterality Date  . ANKLE SURGERY Left   . KNEE SURGERY Left     There were no vitals filed for this visit.      Subjective Assessment - 05/31/16 0830    Subjective  S: My wife won't let me cut the grass but I don't see why i can't because it's just a riding lawnmower and it's not going to take too much energy.   Currently in Pain? No/denies            [facility] OT Assessment - 05/30/16 1624      Assessment   Diagnosis BUE weakness     Precautions   Precautions Other (comment)   Precaution Comments CAN ONLY GO TO 50-60% OF MAX RIGHT NOW                   OT Treatments/Exercises (OP) - 05/30/16 1626      Exercises   Exercises Shoulder;Elbow     Shoulder Exercises: Seated   Extension Strengthening;20 reps  Cybex machine 6 plate     Shoulder Exercises: Standing   Horizontal ABduction Theraband;15 reps   Theraband Level (Shoulder Horizontal ABduction) Level 2 (Red)   Extension Theraband;15  reps   Theraband Level (Shoulder Extension) Level 2 (Red)   Row Theraband;20 reps   Theraband Level (Shoulder Row) Level 2 (Red)   Retraction Theraband;20 reps   Theraband Level (Shoulder Retraction) Level 2 (Red)     Shoulder Exercises: ROM/Strengthening   UBE (Upper Arm Bike) Level 1 2' flexion 2' reverse   Cybex Press 2.5 plate;20 reps   Cybex Row 2.5 plate;20 reps                  OT Short Term Goals - 05/23/16 1131      OT SHORT TERM GOAL #1   Title Patient will be educated and independent with HEP to increase functional performance during daily tasks.    Time 4   Period Weeks     OT SHORT TERM GOAL #2   Title Patient will increase BUE shoulder and elbow strength to 4/5 to increase ability to complete lightweight lifting activities.   Time 4   Period Weeks   Status Partially Met     OT SHORT TERM GOAL #3   Title Patient will increase overal activity tolerance by completing a simple household task for 10-15 minutes without  increased fatigue.    Time 4   Period Weeks     OT SHORT TERM GOAL #4   Title Patient will increase overall core strength to increase ability to complete sit to stands with less difficulty.     Time 4   Period Weeks           OT Long Term Goals - 05/28/16 1655      OT LONG TERM GOAL #1   Title Pt will return to prior level of independence and functioning during daily and work tasks.    Time 8   Period Weeks   Status On-going     OT LONG TERM GOAL #2   Title Patient will increase BUE shoulder and elbow strength to 4+/5 or greater to increase ability to complete work related tasks as well as be able to get up from off the floor as needed.   Time 8   Period Weeks   Status On-going     OT LONG TERM GOAL #3   Title Patient will increase overal activity tolerance by completing a simple household task for 30-35 minutes without increased fatigue.    Time 8   Period Weeks   Status On-going     OT LONG TERM GOAL #4   Title Patient  will increase BUE grip strength to within average norms for age in order to be able to open tight lids or containers with less difficulty.    Time 8   Period Weeks               Plan - 05/31/16 6825    Clinical Impression Statement A: Continues to require cues to monitor exertion level when completing exercises. Discussed talking with his wife and seeing if she'd let him try to cut the lawn with her watching to make sure he is not becoming over exerted.    Plan P: Continue with standing theraband exercises.      Patient will benefit from skilled therapeutic intervention in order to improve the following deficits and impairments:  Decreased endurance, Decreased activity tolerance, Decreased strength  Visit Diagnosis: Other symptoms and signs involving the musculoskeletal system    Problem List Patient Active Problem List   Diagnosis Date Noted  . Bilateral leg weakness 01/24/2015  . Elevated CPK 01/24/2015  . Transaminitis 10/24/2014   Ailene Ravel, OTR/L,CBIS  [phone]  05/31/2016, 8:37 AM  [place] [address] [place], [place], [address] Phone: [phone]   Fax:  [phone]  Name: Jacob Hunter MRN: [id number] Date of Birth: 1956/04/08

## 2016-06-04 ENCOUNTER — Ambulatory Visit (HOSPITAL_COMMUNITY): Payer: 59 | Admitting: Occupational Therapy

## 2016-06-04 ENCOUNTER — Encounter (HOSPITAL_COMMUNITY): Payer: Self-pay | Admitting: Occupational Therapy

## 2016-06-04 ENCOUNTER — Ambulatory Visit (HOSPITAL_COMMUNITY): Payer: 59

## 2016-06-04 DIAGNOSIS — R29898 Other symptoms and signs involving the musculoskeletal system: Secondary | ICD-10-CM | POA: Diagnosis not present

## 2016-06-04 DIAGNOSIS — R262 Difficulty in walking, not elsewhere classified: Secondary | ICD-10-CM

## 2016-06-04 DIAGNOSIS — R2681 Unsteadiness on feet: Secondary | ICD-10-CM

## 2016-06-04 DIAGNOSIS — M6281 Muscle weakness (generalized): Secondary | ICD-10-CM

## 2016-06-04 NOTE — Therapy (Signed)
[place], [place], [address] Phone: [phone]   Fax:  [phone]  Physical Therapy Treatment  Patient Details  Name: Jacob Hunter MRN: [id number] Date of Birth: August 21, 1955 Referring Provider: Dr. Asencion Noble  Encounter Date: 06/04/2016      PT End of Session - 06/04/16 1507    Visit Number 20   Number of Visits 32   Date for PT Re-Evaluation 06/16/16   Authorization Type [employer] (60 visits between PT/OT/speech)   Authorization Time Period 03/18/16 to 05/18/16; 05-18-11/01/2016   PT Start Time 1432   PT Stop Time 1510   PT Time Calculation (min) 38 min   Equipment Utilized During Treatment Gait belt   Activity Tolerance Patient tolerated treatment well;Patient limited by fatigue   Behavior During Therapy [facility] for tasks assessed/performed      Past Medical History:  Diagnosis Date  . Arthritis   . Hypercholesteremia   . Knee pain   . Necrotizing myopathy   . Polymyositis [facility])     Past Surgical History:  Procedure Laterality Date  . ANKLE SURGERY Left   . KNEE SURGERY Left     There were no vitals filed for this visit.      Subjective Assessment - 06/04/16 1427    Subjective Pt stated he is feeling good today, no reports of pain.  Reports he mowed (riding mower) for 10 minutes over weekend.  Pt stated he wants to work on floor to standing and stairs today.     Pertinent History history of L knee arthroscopic surgery, history of L ankle surgery, no other signifcant PMH    Patient Stated Goals improve mobilty, get stronger    Currently in Pain? No/denies                         [facility] Adult PT Treatment/Exercise - 06/04/16 0001      Ambulation/Gait   Ambulation/Gait Yes   Ambulation/Gait Assistance 4: Min guard   Ambulation Distance (Feet) 452 Feet   Assistive device None;Rolling walker  226 ft no AD, 226 with RW   Gait Pattern Step-through  pattern;Decreased stance time - left;Decreased step length - right;Decreased dorsiflexion - left;Decreased weight shift to left   Ambulation Surface Level;Indoor   Stairs Yes   Stairs Assistance 5: Supervision   Number of Stairs --  2RT 7 4in, 4 7in step to pattern ascend and descend   Height of Stairs --  2RT 7 4in, 4 7in step   Gait Comments 433f total; 1st RT no AD 2267f 2RT with RW [id number], WC follow, limited by fatigue      Lumbar Exercises: Supine   Bridge 10 reps   Straight Leg Raise 5 reps     Lumbar Exercises: Quadruped   Other Quadruped Lumbar Exercises tall kneeling 3 sets 15", 18", 30"                   PT Short Term Goals - 05/29/16 1627      PT SHORT TERM GOAL #1   Title Patient to be independent in all bed mobility in order to improve function and QOL at home    Baseline 9/5- reports he has to do bed mobilty on his own at home    Time 4   Period Weeks   Status Achieved     PT SHORT TERM GOAL #2   Title Patient to  be able to tolerate at least 5 minutes of unsupported standing with RPE no more than 5/10 in order to demonstrate improved functional activity tolerance and balance    Baseline 9/5- reports able to perform ADLS unsupported at home   Time 4   Period Weeks   Status Achieved     PT SHORT TERM GOAL #3   Title Patient to be able to ambulate at least 443f with LRAD and Mod(I), RPE no more than 5/10 in order to demonstrate improved mobility and QOL    Baseline 9/5- 452 with walker, high fatigue    Time 4   Period Weeks   Status Partially Met     PT SHORT TERM GOAL #4   Title Patient to be able to perform stand-pivot transfers with LRAD on a Mod(I) basis in order to improve QOL and function at home    Period Weeks   Status Achieved     PT SHORT TERM GOAL #5   Title Patient to be indepedent in correctly and consistently performing appropriate HEP, to be updated PRN   Baseline 9/5- reports compliance    Time 4   Period Weeks   Status  Achieved           PT Long Term Goals - 05/29/16 1627      PT LONG TERM GOAL #1   Title Patient to demonstrate proximal muscle strength at least 4/5 in order to assist in improving balance and tolerance to closed chain activities such as gait    Time 8   Period Weeks   Status On-going     PT LONG TERM GOAL #2   Title Patient to be able to tolerate 15 minutes of unsupported standing in order to demonstrate improved functional activity tolernace and strength, assist in self care based activities    Time 8   Period Weeks   Status On-going     PT LONG TERM GOAL #3   Title Patient to be able to ambulate at least 10059fwith LRAD, RPE no more than 5/10, with Mod(I) in order to improve general mobility and allow patient to access community    Time 8   Period Weeks   Status On-going     PT LONG TERM GOAL #4   Title Patient to be able to hold unsupported tandem stance at least 60 seconds B and SLS at least 30 seconds B in order to demonstrate improved balance and reduced fall risk    Time 8   Period Weeks   Status On-going     PT LONG TERM GOAL #5   Title Patient to be educated on benefits of and participatory in water exercise program at least 2 times per week in order to maintain functional gains and promote regular activity within safe and well tolerated environment   Time 8   Period Weeks   Status On-going               Plan - 06/04/16 1605    Clinical Impression Statement Pt progressing well towards functional gains with ability to complete floor to standing with OT session earlier (PTA sat into treatment this session) and ability to complete 7in step to pattern ascending and descending stairs today.  Gait training complete 1 lap without AD and 2nd RT complete with RW due to fatigue.  Pt does continue to fatigue with activity and improved awareness with fatigue levels between 50-60% through session.  Improved core strengthening with ability to complete tall kneeling  for  30 seconds.  Pt does continue to be limited by fatigue.     Rehab Potential Good   Clinical Impairments Affecting Rehab Potential chronic progressive neuromuscular disease    PT Frequency 2x / week   PT Duration --  16 weeks   PT Treatment/Interventions ADLs/Self Care Home Management;Biofeedback;Cryotherapy;Moist Heat;DME Instruction;Gait training;Stair training;Functional mobility training;Therapeutic activities;Therapeutic exercise;Balance training;Neuromuscular re-education;Patient/family education;Manual techniques;Energy conservation;Taping   PT Next Visit Plan continue strength training at 50% on cybex machines, proximal strength, quadruped tasks       Patient will benefit from skilled therapeutic intervention in order to improve the following deficits and impairments:  Abnormal gait, Improper body mechanics, Cardiopulmonary status limiting activity, Decreased coordination, Decreased mobility, Postural dysfunction, Decreased activity tolerance, Decreased endurance, Decreased strength, Decreased balance, Difficulty walking, Impaired flexibility  Visit Diagnosis: Other symptoms and signs involving the musculoskeletal system  Difficulty in walking, not elsewhere classified  Unsteadiness on feet  Muscle weakness (generalized)  Unsteady gait     Problem List Patient Active Problem List   Diagnosis Date Noted  . Bilateral leg weakness 01/24/2015  . Elevated CPK 01/24/2015  . Transaminitis 10/24/2014   Ihor Austin, [place]; [place]  Aldona Lento 06/04/2016, 4:21 PM  [place], [place], [address] Phone: [phone]   Fax:  [phone]  Name: HARUKI ARNOLD MRN: [id number] Date of Birth: [birth year]

## 2016-06-04 NOTE — Therapy (Signed)
[place] [address] [place], [place], [address] Phone: [phone]   Fax:  [phone]  Physical Therapy Treatment  Patient Details  Name: Jacob Hunter MRN: [id number] Date of Birth: Nov 17, 1955 Referring Provider: Dr. Asencion Noble  Encounter Date: 05/23/2016    Past Medical History:  Diagnosis Date  . Arthritis   . Hypercholesteremia   . Knee pain   . Necrotizing myopathy   . Polymyositis [facility])     Past Surgical History:  Procedure Laterality Date  . ANKLE SURGERY Left   . KNEE SURGERY Left     There were no vitals filed for this visit.    Subjective: Pt stated he is feeling good today, had rituxan infusion treatment yesterday with bruise on Lt UE.  No reports of pain currently or recent falls.  Reports he has been doing more walking around the house.     Currently in pain: No/denies       05/23/16 0001  Ambulation/Gait  Ambulation/Gait Yes  Ambulation/Gait Assistance 4: Min guard  Ambulation Distance (Feet) 308 Feet  Assistive device Rolling walker  Gait Pattern Step-through pattern;Decreased stance time - left;Decreased step length - right;Decreased dorsiflexion - left;Decreased weight shift to left  Ambulation Surface Level;Indoor  Gait Comments 308 ft with rW  Knee/Hip Exercises: Machines for Strengthening  Cybex Knee Extension 3 Pl 3 sets x 10 reps  Cybex Leg Press 5 Pl 3 setsx 10  Cybex Knee Flexion 3 PL 3 sets x 10   Quadruped Tall kneeling 3 reps   Crawling 3RT         PT Short Term Goals - 05/29/16 1627      PT SHORT TERM GOAL #1   Title Patient to be independent in all bed mobility in order to improve function and QOL at home    Baseline 9/5- reports he has to do bed mobilty on his own at home    Time 4   Period Weeks   Status Achieved     PT SHORT TERM GOAL #2   Title Patient to be able to tolerate at least 5 minutes of unsupported standing with RPE no more than 5/10 in order to  demonstrate improved functional activity tolerance and balance    Baseline 9/5- reports able to perform ADLS unsupported at home   Time 4   Period Weeks   Status Achieved     PT SHORT TERM GOAL #3   Title Patient to be able to ambulate at least 475f with LRAD and Mod(I), RPE no more than 5/10 in order to demonstrate improved mobility and QOL    Baseline 9/5- 452 with walker, high fatigue    Time 4   Period Weeks   Status Partially Met     PT SHORT TERM GOAL #4   Title Patient to be able to perform stand-pivot transfers with LRAD on a Mod(I) basis in order to improve QOL and function at home    Period Weeks   Status Achieved     PT SHORT TERM GOAL #5   Title Patient to be indepedent in correctly and consistently performing appropriate HEP, to be updated PRN   Baseline 9/5- reports compliance    Time 4   Period Weeks   Status Achieved           PT Long Term Goals - 05/29/16 1627      PT LONG TERM GOAL #1   Title Patient to demonstrate proximal muscle strength  at least 4/5 in order to assist in improving balance and tolerance to closed chain activities such as gait    Time 8   Period Weeks   Status On-going     PT LONG TERM GOAL #2   Title Patient to be able to tolerate 15 minutes of unsupported standing in order to demonstrate improved functional activity tolernace and strength, assist in self care based activities    Time 8   Period Weeks   Status On-going     PT LONG TERM GOAL #3   Title Patient to be able to ambulate at least 1066f with LRAD, RPE no more than 5/10, with Mod(I) in order to improve general mobility and allow patient to access community    Time 8   Period Weeks   Status On-going     PT LONG TERM GOAL #4   Title Patient to be able to hold unsupported tandem stance at least 60 seconds B and SLS at least 30 seconds B in order to demonstrate improved balance and reduced fall risk    Time 8   Period Weeks   Status On-going     PT LONG TERM GOAL #5    Title Patient to be educated on benefits of and participatory in water exercise program at least 2 times per week in order to maintain functional gains and promote regular activity within safe and well tolerated environment   Time 8   Period Weeks   Status On-going             Patient will benefit from skilled therapeutic intervention in order to improve the following deficits and impairments:  Abnormal gait, Improper body mechanics, Cardiopulmonary status limiting activity, Decreased coordination, Decreased mobility, Postural dysfunction, Decreased activity tolerance, Decreased endurance, Decreased strength, Decreased balance, Difficulty walking, Impaired flexibility  Visit Diagnosis: Other symptoms and signs involving the musculoskeletal system  Difficulty in walking, not elsewhere classified  Unsteadiness on feet  Muscle weakness (generalized)     Problem List Patient Active Problem List   Diagnosis Date Noted  . Bilateral leg weakness 01/24/2015  . Elevated CPK 01/24/2015  . Transaminitis 10/24/2014   CIhor Austin L[place]ry [place] CAldona Lento10/24/2017, 8:25 AM  [place] [address]Phone: [phone]  Fax:  [phone] Name: VXUE LOWMRN: [id number] of Birth: [birth year]

## 2016-06-04 NOTE — Therapy (Signed)
[place], [place], [address] Phone: [phone]   Fax:  [phone]  Occupational Therapy Treatment  Patient Details  Name: Jacob Hunter MRN: [id number] Date of Birth: [birth year] Referring Provider: Dr. Asencion Noble  Encounter Date: 06/04/2016      OT End of Session - 06/04/16 1618    Visit Number 19   Number of Visits 24   Date for OT Re-Evaluation 06/22/16   Authorization Type [employer]   Authorization Time Period Visit limit 60 - 0 used   Authorization - Visit Number 19   Authorization - Number of Visits 60   OT Start Time 1350   OT Stop Time 1430   OT Time Calculation (min) 40 min   Activity Tolerance Patient tolerated treatment well   Behavior During Therapy WFL for tasks assessed/performed      Past Medical History:  Diagnosis Date  . Arthritis   . Hypercholesteremia   . Knee pain   . Necrotizing myopathy   . Polymyositis [facility])     Past Surgical History:  Procedure Laterality Date  . ANKLE SURGERY Left   . KNEE SURGERY Left     There were no vitals filed for this visit.      Subjective Assessment - 06/04/16 1614    Subjective  S: My wife let me mow the grass for 10 minutes.    Currently in Pain? No/denies                      OT Treatments/Exercises (OP) - 06/04/16 1516      Exercises   Exercises Shoulder;Elbow     Shoulder Exercises: Seated   Protraction Strengthening;12 reps;Theraband;10 reps   Theraband Level (Shoulder Protraction) Level 3 (Green)   Protraction Weight (lbs) 3   Horizontal ABduction Strengthening;12 reps;Theraband;10 reps   Theraband Level (Shoulder Horizontal ABduction) Level 3 (Green)   Horizontal ABduction Weight (lbs) 3   External Rotation Strengthening;12 reps;Theraband;10 reps   Theraband Level (Shoulder External Rotation) Level 3 (Green)   External Rotation Weight (lbs) 3   Internal Rotation Strengthening;12  reps;Theraband;10 reps   Theraband Level (Shoulder Internal Rotation) Level 3 (Green)   Internal Rotation Weight (lbs) 3   Flexion Strengthening;12 reps   Flexion Weight (lbs) 3   Abduction Strengthening;12 reps   ABduction Weight (lbs) 3     Shoulder Exercises: ROM/Strengthening   X to V Arms 12X   Other ROM/Strengthening Exercises Arms on fire: 4 positions, 10 seconds each     Neurological Re-education Exercises   Other Exercises 1 Pt completed 2 transitions from quadruped to tall kneeling in preparation for standing up. Pt then transitioned to mat on floor and practiced standing up from laying supine on floor. Pt transitioned from supine to left side, transitioning to quadruped to tall kneeling with hands on sturdy chair. Pt finally came to standing by pushing up with BUE and lifting right foot then left foot to come to standing. Min guard required.                   OT Short Term Goals - 05/23/16 1131      OT SHORT TERM GOAL #1   Title Patient will be educated and independent with HEP to increase functional performance during daily tasks.    Time 4   Period Weeks     OT SHORT TERM GOAL #2   Title Patient will increase BUE shoulder and elbow  strength to 4/5 to increase ability to complete lightweight lifting activities.   Time 4   Period Weeks   Status Partially Met     OT SHORT TERM GOAL #3   Title Patient will increase overal activity tolerance by completing a simple household task for 10-15 minutes without increased fatigue.    Time 4   Period Weeks     OT SHORT TERM GOAL #4   Title Patient will increase overall core strength to increase ability to complete sit to stands with less difficulty.     Time 4   Period Weeks           OT Long Term Goals - 05/28/16 1655      OT LONG TERM GOAL #1   Title Pt will return to prior level of independence and functioning during daily and work tasks.    Time 8   Period Weeks   Status On-going     OT LONG TERM GOAL  #2   Title Patient will increase BUE shoulder and elbow strength to 4+/5 or greater to increase ability to complete work related tasks as well as be able to get up from off the floor as needed.   Time 8   Period Weeks   Status On-going     OT LONG TERM GOAL #3   Title Patient will increase overal activity tolerance by completing a simple household task for 30-35 minutes without increased fatigue.    Time 8   Period Weeks   Status On-going     OT LONG TERM GOAL #4   Title Patient will increase BUE grip strength to within average norms for age in order to be able to open tight lids or containers with less difficulty.    Time 8   Period Weeks               Plan - 06/04/16 1618    Clinical Impression Statement A: Pt requested to practice getting up off the floor in the event of a fall. Pt able to complete sequencing and perform practice task with min guard assist from OT and PTA. Pt reports he was able to mow the lawn for 10 minutes and did not feel winded or exerted. Theraband completed in sitting versus standing due to pt exertion level.    Plan P: Complete standing theraband, overhead reaching with weights.   Consulted and Agree with Plan of Care Patient      Patient will benefit from skilled therapeutic intervention in order to improve the following deficits and impairments:  Decreased endurance, Decreased activity tolerance, Decreased strength  Visit Diagnosis: Other symptoms and signs involving the musculoskeletal system    Problem List Patient Active Problem List   Diagnosis Date Noted  . Bilateral leg weakness 01/24/2015  . Elevated CPK 01/24/2015  . Transaminitis 10/24/2014   Guadelupe Sabin, OTR/L  [phone] 06/04/2016, 4:21 PM  [place] [address] [place], [place], [address] Phone: [phone]   Fax:  [phone]  Name: HERSON PRICHARD MRN: [id number] Date of Birth: 1955/12/25

## 2016-06-06 ENCOUNTER — Ambulatory Visit (HOSPITAL_COMMUNITY): Payer: 59

## 2016-06-06 ENCOUNTER — Encounter (HOSPITAL_COMMUNITY): Payer: Self-pay

## 2016-06-06 DIAGNOSIS — R262 Difficulty in walking, not elsewhere classified: Secondary | ICD-10-CM

## 2016-06-06 DIAGNOSIS — M6281 Muscle weakness (generalized): Secondary | ICD-10-CM

## 2016-06-06 DIAGNOSIS — R29898 Other symptoms and signs involving the musculoskeletal system: Secondary | ICD-10-CM

## 2016-06-06 DIAGNOSIS — R2681 Unsteadiness on feet: Secondary | ICD-10-CM

## 2016-06-06 NOTE — Therapy (Signed)
[place], [place], [address] Phone: [phone]   Fax:  [phone]  Physical Therapy Treatment  Patient Details  Name: Jacob Hunter MRN: [id number] Date of Birth: 1956-05-21 Referring Provider: Dr. Asencion Noble  Encounter Date: 06/06/2016      PT End of Session - 06/06/16 1520    Visit Number 21   Number of Visits 32   Date for PT Re-Evaluation 06/16/16   Authorization Type [employer] (60 visits between PT/OT/speech)   Authorization Time Period 03/18/16 to 05/18/16; 05-18-11/01/2016   PT Start Time 1437   PT Stop Time 1520   PT Time Calculation (min) 43 min   Equipment Utilized During Treatment Gait belt   Activity Tolerance Patient tolerated treatment well;Patient limited by fatigue   Behavior During Therapy [facility] for tasks assessed/performed      Past Medical History:  Diagnosis Date  . Arthritis   . Hypercholesteremia   . Knee pain   . Necrotizing myopathy   . Polymyositis [facility])     Past Surgical History:  Procedure Laterality Date  . ANKLE SURGERY Left   . KNEE SURGERY Left     There were no vitals filed for this visit.                       [facility] Adult PT Treatment/Exercise - 06/06/16 0001      Bed Mobility   Bed Mobility --  Prone to quadruped to half kneeling: MaxA: 2x30sec bilat   Supine to Sit 6: Modified independent (Device/Increase time)     Transfers   Sit to Stand 5: Supervision;With upper extremity assist   Comments with gower's sign     Ambulation/Gait   Ambulation/Gait Yes   Ambulation/Gait Assistance 4: Min guard   Ambulation Distance (Feet) 675 Feet   Assistive device Rolling walker   Gait Pattern Step-through pattern   Gait Comments Terminal Vitals: HR 151bpm; BP: 159/90     Posture/Postural Control   Posture Comments Quadruped to half kneeling  *see above     Lumbar Exercises: Aerobic   Elliptical NuStep 10 minutes: level 2,  seat 14, arms 11  SaO2: 98%, HR: 138-142 at mid point/end;                   PT Short Term Goals - 05/29/16 1627      PT SHORT TERM GOAL #1   Title Patient to be independent in all bed mobility in order to improve function and QOL at home    Baseline 9/5- reports he has to do bed mobilty on his own at home    Time 4   Period Weeks   Status Achieved     PT SHORT TERM GOAL #2   Title Patient to be able to tolerate at least 5 minutes of unsupported standing with RPE no more than 5/10 in order to demonstrate improved functional activity tolerance and balance    Baseline 9/5- reports able to perform ADLS unsupported at home   Time 4   Period Weeks   Status Achieved     PT SHORT TERM GOAL #3   Title Patient to be able to ambulate at least 47f with LRAD and Mod(I), RPE no more than 5/10 in order to demonstrate improved mobility and QOL    Baseline 9/5- 452 with walker, high fatigue    Time 4   Period Weeks   Status Partially Met  PT SHORT TERM GOAL #4   Title Patient to be able to perform stand-pivot transfers with LRAD on a Mod(I) basis in order to improve QOL and function at home    Period Weeks   Status Achieved     PT SHORT TERM GOAL #5   Title Patient to be indepedent in correctly and consistently performing appropriate HEP, to be updated PRN   Baseline 9/5- reports compliance    Time 4   Period Weeks   Status Achieved           PT Long Term Goals - 05/29/16 1627      PT LONG TERM GOAL #1   Title Patient to demonstrate proximal muscle strength at least 4/5 in order to assist in improving balance and tolerance to closed chain activities such as gait    Time 8   Period Weeks   Status On-going     PT LONG TERM GOAL #2   Title Patient to be able to tolerate 15 minutes of unsupported standing in order to demonstrate improved functional activity tolernace and strength, assist in self care based activities    Time 8   Period Weeks   Status On-going      PT LONG TERM GOAL #3   Title Patient to be able to ambulate at least 1054f with LRAD, RPE no more than 5/10, with Mod(I) in order to improve general mobility and allow patient to access community    Time 8   Period Weeks   Status On-going     PT LONG TERM GOAL #4   Title Patient to be able to hold unsupported tandem stance at least 60 seconds B and SLS at least 30 seconds B in order to demonstrate improved balance and reduced fall risk    Time 8   Period Weeks   Status On-going     PT LONG TERM GOAL #5   Title Patient to be educated on benefits of and participatory in water exercise program at least 2 times per week in order to maintain functional gains and promote regular activity within safe and well tolerated environment   Time 8   Period Weeks   Status On-going               Plan - 06/06/16 1521    Clinical Impression Statement Pt doing well this session. AMB is progressed in volume, but with additional breaks. Gait trial of 225' (75s) is noted to bring the patient to 94% of maximal heart rate, with 6/10 DOE, hence adequate rest breaks provided to help the patient tolerate additional activity, and avoid excessive time at maximal exersion activity level. Pt trials on Recumbent stepper at end of sesion to address cardiovascular deconditioning,  tolerated well, with HR maintained at 85% maxHR. Core training is tolerated well, but patient does require heavy physical ssistance to perform, avoid falls, and protect joints from abberrant movements.      Rehab Potential Good   Clinical Impairments Affecting Rehab Potential Necrotizing Autoimmune Myopathy (progressive nature of disease process), chronic steroid use, chronic cardiovascular deconditioning.    PT Frequency 2x / week   PT Duration Other (comment)  16 weeks   PT Treatment/Interventions ADLs/Self Care Home Management;Biofeedback;Cryotherapy;Moist Heat;DME Instruction;Gait training;Stair training;Functional mobility  training;Therapeutic activities;Therapeutic exercise;Balance training;Neuromuscular re-education;Patient/family education;Manual techniques;Energy conservation;Taping   PT Next Visit Plan continue strength training at 50% effort on cybex machines, proximal strength, and quadruped tasks;    PT Home Exercise Plan No updates.  Consulted and Agree with Plan of Care Patient      Patient will benefit from skilled therapeutic intervention in order to improve the following deficits and impairments:  Abnormal gait, Improper body mechanics, Cardiopulmonary status limiting activity, Decreased coordination, Decreased mobility, Postural dysfunction, Decreased activity tolerance, Decreased endurance, Decreased strength, Decreased balance, Difficulty walking, Impaired flexibility  Visit Diagnosis: Other symptoms and signs involving the musculoskeletal system  Difficulty in walking, not elsewhere classified  Unsteadiness on feet  Muscle weakness (generalized)     Problem List Patient Active Problem List   Diagnosis Date Noted  . Bilateral leg weakness 01/24/2015  . Elevated CPK 01/24/2015  . Transaminitis 10/24/2014    3:45 PM, 06/06/16 Etta Grandchild, PT, DPT Physical Therapist at [facility] [phone] (office)     [facility] [address] [place], [place], [address] Phone: [phone]   Fax:  [phone]  Name: Jacob Hunter MRN: [id number] Date of Birth: [birth year]

## 2016-06-06 NOTE — Therapy (Signed)
[place], [place], [address] Phone: [phone]   Fax:  [phone]  Occupational Therapy Treatment  Patient Details  Name: Jacob Hunter MRN: [id number] Date of Birth: 17-Apr-1956 Referring Provider: Dr. Asencion Noble  Encounter Date: 06/06/2016      OT End of Session - 06/06/16 1640    Visit Number 20   Number of Visits 24   Date for OT Re-Evaluation 06/22/16   Authorization Type [employer]   Authorization Time Period Visit limit 60 - 0 used   Authorization - Visit Number 20   Authorization - Number of Visits 60   OT Start Time 1525   OT Stop Time 1605   OT Time Calculation (min) 40 min   Activity Tolerance Patient tolerated treatment well   Behavior During Therapy [facility] for tasks assessed/performed      Past Medical History:  Diagnosis Date  . Arthritis   . Hypercholesteremia   . Knee pain   . Necrotizing myopathy   . Polymyositis [facility])     Past Surgical History:  Procedure Laterality Date  . ANKLE SURGERY Left   . KNEE SURGERY Left     There were no vitals filed for this visit.      Subjective Assessment - 06/06/16 1639    Subjective  S: My shoulder is sore from last session. I did a lot with the hand weights.    Currently in Pain? Yes   Pain Score 4    Pain Location Shoulder   Pain Orientation Right   Pain Descriptors / Indicators Aching   Pain Type Acute pain   Pain Radiating Towards N/A   Pain Onset In the past 7 days   Pain Frequency Occasional   Aggravating Factors  Last OT session   Pain Relieving Factors Rest   Effect of Pain on Daily Activities None   Multiple Pain Sites No                      OT Treatments/Exercises (OP) - 06/06/16 1542      Exercises   Exercises Shoulder;Elbow     Shoulder Exercises: Seated   Extension Strengthening;20 reps  6 plate Cybex machine   Retraction Strengthening;20 reps  4 plate with Cybex machine   Protraction  Strengthening;20 reps  Cybex machine 2 plate     Shoulder Exercises: ROM/Strengthening   Over Head Lace 2'30" to lace 3'38" to unlace with 2;b wrist wrist bilateral   Other ROM/Strengthening Exercises Arms on fire: 6 positions, 10 seconds each                  OT Short Term Goals - 05/23/16 1131      OT SHORT TERM GOAL #1   Title Patient will be educated and independent with HEP to increase functional performance during daily tasks.    Time 4   Period Weeks     OT SHORT TERM GOAL #2   Title Patient will increase BUE shoulder and elbow strength to 4/5 to increase ability to complete lightweight lifting activities.   Time 4   Period Weeks   Status Partially Met     OT SHORT TERM GOAL #3   Title Patient will increase overal activity tolerance by completing a simple household task for 10-15 minutes without increased fatigue.    Time 4   Period Weeks     OT SHORT TERM GOAL #4   Title Patient will  increase overall core strength to increase ability to complete sit to stands with less difficulty.     Time 4   Period Weeks           OT Long Term Goals - 05/28/16 1655      OT LONG TERM GOAL #1   Title Pt will return to prior level of independence and functioning during daily and work tasks.    Time 8   Period Weeks   Status On-going     OT LONG TERM GOAL #2   Title Patient will increase BUE shoulder and elbow strength to 4+/5 or greater to increase ability to complete work related tasks as well as be able to get up from off the floor as needed.   Time 8   Period Weeks   Status On-going     OT LONG TERM GOAL #3   Title Patient will increase overal activity tolerance by completing a simple household task for 30-35 minutes without increased fatigue.    Time 8   Period Weeks   Status On-going     OT LONG TERM GOAL #4   Title Patient will increase BUE grip strength to within average norms for age in order to be able to open tight lids or containers with less  difficulty.    Time 8   Period Weeks               Plan - 06/06/16 1641    Clinical Impression Statement A: UB strengthening focused on this session. Patient did becomed fatigued a few times and required frequent rest breaks. VC for form and technique as needed.    Plan P: Complete standing theraband. Continue with Cybex machine for UB strengthening.       Patient will benefit from skilled therapeutic intervention in order to improve the following deficits and impairments:  Decreased endurance, Decreased activity tolerance, Decreased strength  Visit Diagnosis: Other symptoms and signs involving the musculoskeletal system    Problem List Patient Active Problem List   Diagnosis Date Noted  . Bilateral leg weakness 01/24/2015  . Elevated CPK 01/24/2015  . Transaminitis 10/24/2014   Ailene Ravel, OTR/L,CBIS  [phone]  06/06/2016, 4:53 PM  [facility] [address] [place], [place], [address] Phone: [phone]   Fax:  [phone]  Name: NORVILLE DANI MRN: [id number] Date of Birth: Dec 17, 1955

## 2016-06-11 ENCOUNTER — Ambulatory Visit (HOSPITAL_COMMUNITY): Payer: 59

## 2016-06-11 ENCOUNTER — Encounter (HOSPITAL_COMMUNITY): Payer: Self-pay

## 2016-06-11 DIAGNOSIS — R29898 Other symptoms and signs involving the musculoskeletal system: Secondary | ICD-10-CM | POA: Diagnosis not present

## 2016-06-11 DIAGNOSIS — M6281 Muscle weakness (generalized): Secondary | ICD-10-CM

## 2016-06-11 DIAGNOSIS — R2681 Unsteadiness on feet: Secondary | ICD-10-CM

## 2016-06-11 DIAGNOSIS — R262 Difficulty in walking, not elsewhere classified: Secondary | ICD-10-CM

## 2016-06-11 NOTE — Therapy (Signed)
[place], [place], [address] Phone: [phone]   Fax:  [phone]  Physical Therapy Treatment  Patient Details  Name: Jacob Hunter MRN: [id number] Date of Birth: 1956/07/10 Referring Provider: Dr. Asencion Noble  Encounter Date: 06/11/2016      PT End of Session - 06/11/16 1520    Visit Number 22   Number of Visits 32   Date for PT Re-Evaluation 06/16/16   Authorization Type [employer] (60 visits between PT/OT/speech)   Authorization Time Period 03/18/16 to 05/18/16; 05-18-11/01/2016   PT Start Time 1438   PT Stop Time 1520   PT Time Calculation (min) 42 min   Equipment Utilized During Treatment Gait belt   Activity Tolerance Patient tolerated treatment well;Patient limited by fatigue   Behavior During Therapy [facility] for tasks assessed/performed      Past Medical History:  Diagnosis Date  . Arthritis   . Hypercholesteremia   . Knee pain   . Necrotizing myopathy   . Polymyositis [facility])     Past Surgical History:  Procedure Laterality Date  . ANKLE SURGERY Left   . KNEE SURGERY Left     There were no vitals filed for this visit.      Subjective Assessment - 06/11/16 1453    Subjective Pt doijng well today. He had enzymes checked last week adn they were somewhat elevated, but doctor would like to decrease prednisone this week again.    Pertinent History history of L knee arthroscopic surgery, history of L ankle surgery, no other signifcant PMH                          [facility] Adult PT Treatment/Exercise - 06/11/16 0001      Ambulation/Gait   Ambulation/Gait Yes   Ambulation/Gait Assistance 5: Supervision   Ambulation Distance (Feet) 900 Feet  8x112.89f, 60sec rest between HR: 115-120bpm     Lumbar Exercises: Seated   Sit to Stand 20 reps  2x10 1st set with monster band, 2nd with modA at chest/sacru                  PT Short Term Goals - 05/29/16  1627      PT SHORT TERM GOAL #1   Title Patient to be independent in all bed mobility in order to improve function and QOL at home    Baseline 9/5- reports he has to do bed mobilty on his own at home    Time 4   Period Weeks   Status Achieved     PT SHORT TERM GOAL #2   Title Patient to be able to tolerate at least 5 minutes of unsupported standing with RPE no more than 5/10 in order to demonstrate improved functional activity tolerance and balance    Baseline 9/5- reports able to perform ADLS unsupported at home   Time 4   Period Weeks   Status Achieved     PT SHORT TERM GOAL #3   Title Patient to be able to ambulate at least 4067fwith LRAD and Mod(I), RPE no more than 5/10 in order to demonstrate improved mobility and QOL    Baseline 9/5- 452 with walker, high fatigue    Time 4   Period Weeks   Status Partially Met     PT SHORT TERM GOAL #4   Title Patient to be able to perform stand-pivot transfers with LRAD on a Mod(I)  basis in order to improve QOL and function at home    Period Weeks   Status Achieved     PT SHORT TERM GOAL #5   Title Patient to be indepedent in correctly and consistently performing appropriate HEP, to be updated PRN   Baseline 9/5- reports compliance    Time 4   Period Weeks   Status Achieved           PT Long Term Goals - 05/29/16 1627      PT LONG TERM GOAL #1   Title Patient to demonstrate proximal muscle strength at least 4/5 in order to assist in improving balance and tolerance to closed chain activities such as gait    Time 8   Period Weeks   Status On-going     PT LONG TERM GOAL #2   Title Patient to be able to tolerate 15 minutes of unsupported standing in order to demonstrate improved functional activity tolernace and strength, assist in self care based activities    Time 8   Period Weeks   Status On-going     PT LONG TERM GOAL #3   Title Patient to be able to ambulate at least 1066f with LRAD, RPE no more than 5/10, with Mod(I)  in order to improve general mobility and allow patient to access community    Time 8   Period Weeks   Status On-going     PT LONG TERM GOAL #4   Title Patient to be able to hold unsupported tandem stance at least 60 seconds B and SLS at least 30 seconds B in order to demonstrate improved balance and reduced fall risk    Time 8   Period Weeks   Status On-going     PT LONG TERM GOAL #5   Title Patient to be educated on benefits of and participatory in water exercise program at least 2 times per week in order to maintain functional gains and promote regular activity within safe and well tolerated environment   Time 8   Period Weeks   Status On-going               Plan - 06/11/16 1523    Clinical Impression Statement Pt doing well today, tolerating continued progression of AMB as he really would like to increase his performance and independence in household distances. Additional rest breaks taken today, alternating 60seconds on/60 seconds off, with overall increase in gait quality, lower RPE, and better controled HR and DOE. Pt appears more steady on feet and is able to stand with improved posture, as he reports he has been addressing these deficits at home over the last few days. Pt making good progress toward goals.    Rehab Potential Good   Clinical Impairments Affecting Rehab Potential Necrotizing Autoimmune Myopathy (progressive nature of disease process), chronic steroid use, chronic cardiovascular deconditioning.    PT Frequency 2x / week   PT Duration Other (comment)   PT Treatment/Interventions ADLs/Self Care Home Management;Biofeedback;Cryotherapy;Moist Heat;DME Instruction;Gait training;Stair training;Functional mobility training;Therapeutic activities;Therapeutic exercise;Balance training;Neuromuscular re-education;Patient/family education;Manual techniques;Energy conservation;Taping   PT Next Visit Plan continue strength training at 50% effort on cybex machines, proximal  strength, and quadruped tasks;    PT Home Exercise Plan No updates.    Consulted and Agree with Plan of Care Patient      Patient will benefit from skilled therapeutic intervention in order to improve the following deficits and impairments:  Abnormal gait, Improper body mechanics, Cardiopulmonary status limiting activity, Decreased coordination, Decreased  mobility, Postural dysfunction, Decreased activity tolerance, Decreased endurance, Decreased strength, Decreased balance, Difficulty walking, Impaired flexibility  Visit Diagnosis: Other symptoms and signs involving the musculoskeletal system  Difficulty in walking, not elsewhere classified  Unsteadiness on feet  Muscle weakness (generalized)     Problem List Patient Active Problem List   Diagnosis Date Noted  . Bilateral leg weakness 01/24/2015  . Elevated CPK 01/24/2015  . Transaminitis 10/24/2014    8:58 PM, 06/11/16 Etta Grandchild, PT, DPT Physical Therapist at [facility] [phone] (office)     [facility] [address] [place], [place], [address] Phone: [phone]   Fax:  [phone]  Name: Jacob Hunter MRN: [id number] Date of Birth: 07-20-1956

## 2016-06-11 NOTE — Therapy (Signed)
[place], [place], [address] Phone: [phone]   Fax:  [phone]  Occupational Therapy Treatment  Patient Details  Name: Jacob Hunter MRN: [id number] Date of Birth: 1955/09/03 Referring Provider: Dr. Asencion Noble  Encounter Date: 06/11/2016      OT End of Session - 06/11/16 1556    Visit Number 21   Number of Visits 24   Date for OT Re-Evaluation 06/22/16   Authorization Type [employer]   Authorization Time Period Visit limit 60 - 0 used   Authorization - Visit Number 21   Authorization - Number of Visits 81   OT Start Time 4709   OT Stop Time 1600   OT Time Calculation (min) 45 min   Activity Tolerance Patient tolerated treatment well   Behavior During Therapy [facility] for tasks assessed/performed      Past Medical History:  Diagnosis Date  . Arthritis   . Hypercholesteremia   . Knee pain   . Necrotizing myopathy   . Polymyositis [facility])     Past Surgical History:  Procedure Laterality Date  . ANKLE SURGERY Left   . KNEE SURGERY Left     There were no vitals filed for this visit.      Subjective Assessment - 06/11/16 1555    Subjective  S: No complaints. Nothing new to report.    Currently in Pain? No/denies                      OT Treatments/Exercises (OP) - 06/11/16 1534      Exercises   Exercises Shoulder;Elbow     Shoulder Exercises: Seated   Extension Strengthening;20 reps  6 plate; cybex machine   Retraction Strengthening;20 reps  5 plate; Cybex machine   Protraction Strengthening;20 reps  2 plate; cybex machine     Shoulder Exercises: ROM/Strengthening   UBE (Upper Arm Bike) Level 2 2' flexion 2' reverse   Over Head Lace 2'49" lacing 3'55" to unlace with 2# bilateral wrist weights.   Other ROM/Strengthening Exercises Arms on fire: 6 positions, 15 seconds each     Elbow Exercises   Other elbow exercises Elbow flexion; 3 plate; cybex machine                   OT Short Term Goals - 05/23/16 1131      OT SHORT TERM GOAL #1   Title Patient will be educated and independent with HEP to increase functional performance during daily tasks.    Time 4   Period Weeks     OT SHORT TERM GOAL #2   Title Patient will increase BUE shoulder and elbow strength to 4/5 to increase ability to complete lightweight lifting activities.   Time 4   Period Weeks   Status Partially Met     OT SHORT TERM GOAL #3   Title Patient will increase overal activity tolerance by completing a simple household task for 10-15 minutes without increased fatigue.    Time 4   Period Weeks     OT SHORT TERM GOAL #4   Title Patient will increase overall core strength to increase ability to complete sit to stands with less difficulty.     Time 4   Period Weeks           OT Long Term Goals - 05/28/16 1655      OT LONG TERM GOAL #1   Title Pt will return to prior  level of independence and functioning during daily and work tasks.    Time 8   Period Weeks   Status On-going     OT LONG TERM GOAL #2   Title Patient will increase BUE shoulder and elbow strength to 4+/5 or greater to increase ability to complete work related tasks as well as be able to get up from off the floor as needed.   Time 8   Period Weeks   Status On-going     OT LONG TERM GOAL #3   Title Patient will increase overal activity tolerance by completing a simple household task for 30-35 minutes without increased fatigue.    Time 8   Period Weeks   Status On-going     OT LONG TERM GOAL #4   Title Patient will increase BUE grip strength to within average norms for age in order to be able to open tight lids or containers with less difficulty.    Time 8   Period Weeks               Plan - 06/11/16 1557    Clinical Impression Statement A: Continued with UB strengthening. patient was able to increase his time during Arms on fire and was able to increase weight on some UB  exercises while using the Cybex machine.    Plan P: Complete standing theraband. Continue with Cybex machine for UB strengthening.    Consulted and Agree with Plan of Care Patient      Patient will benefit from skilled therapeutic intervention in order to improve the following deficits and impairments:  Decreased endurance, Decreased activity tolerance, Decreased strength  Visit Diagnosis: Other symptoms and signs involving the musculoskeletal system    Problem List Patient Active Problem List   Diagnosis Date Noted  . Bilateral leg weakness 01/24/2015  . Elevated CPK 01/24/2015  . Transaminitis 10/24/2014   Ailene Ravel, OTR/L,CBIS  [phone]  06/11/2016, 4:13 PM  [place] [facility] [address] [place], [place], [address] Phone: [phone]   Fax:  [phone]  Name: Jacob Hunter MRN: [id number] Date of Birth: [birth year]

## 2016-06-18 ENCOUNTER — Ambulatory Visit (HOSPITAL_COMMUNITY): Payer: 59

## 2016-06-18 ENCOUNTER — Ambulatory Visit (HOSPITAL_COMMUNITY): Payer: 59 | Attending: Internal Medicine

## 2016-06-18 ENCOUNTER — Encounter (HOSPITAL_COMMUNITY): Payer: Self-pay

## 2016-06-18 VITALS — BP 134/89

## 2016-06-18 DIAGNOSIS — R29898 Other symptoms and signs involving the musculoskeletal system: Secondary | ICD-10-CM | POA: Insufficient documentation

## 2016-06-18 DIAGNOSIS — R262 Difficulty in walking, not elsewhere classified: Secondary | ICD-10-CM | POA: Insufficient documentation

## 2016-06-18 DIAGNOSIS — M6281 Muscle weakness (generalized): Secondary | ICD-10-CM | POA: Diagnosis present

## 2016-06-18 DIAGNOSIS — R2681 Unsteadiness on feet: Secondary | ICD-10-CM | POA: Insufficient documentation

## 2016-06-18 NOTE — Therapy (Signed)
[place], [place], [address] Phone: [phone]   Fax:  [phone]  Physical Therapy Treatment  Patient Details  Name: Jacob Hunter MRN: [id number] Date of Birth: 1956/05/20 Referring Provider: Dr. Asencion Noble  Encounter Date: 06/18/2016      PT End of Session - 06/18/16 1526    Visit Number 23   Number of Visits 32   Date for PT Re-Evaluation 06/16/16   Authorization Type [employer] (60 visits between PT/OT/speech)   Authorization Time Period 03/18/16 to 05/18/16; 05-18-11/01/2016   PT Start Time 1520   PT Stop Time 1604   PT Time Calculation (min) 44 min   Equipment Utilized During Treatment Gait belt   Activity Tolerance Patient tolerated treatment well;Patient limited by fatigue   Behavior During Therapy [facility] for tasks assessed/performed      Past Medical History:  Diagnosis Date  . Arthritis   . Hypercholesteremia   . Knee pain   . Necrotizing myopathy   . Polymyositis [facility])     Past Surgical History:  Procedure Laterality Date  . ANKLE SURGERY Left   . KNEE SURGERY Left     Vitals:   06/18/16 1547  BP: 134/89  SpO2: 97%        Subjective Assessment - 06/18/16 1520    Subjective Pt stated (with big smile on face) he has been walking more around the house and able to stand for 20 minutes, feels his calf muscles are getting stronger.   Pertinent History history of L knee arthroscopic surgery, history of L ankle surgery, no other signifcant PMH    Patient Stated Goals improve mobilty, get stronger    Currently in Pain? No/denies              [facility] Adult PT Treatment/Exercise - 06/18/16 1600      Transfers   Transfers Floor to Transfer   Floor to Transfer 5: Supervision  floor to standing by mat, no assistance does use UE A at mat   Comments HR 112 following floor to standing 1 RT     Ambulation/Gait   Ambulation/Gait Yes   Ambulation/Gait Assistance 5:  Supervision   Ambulation Distance (Feet) 900 Feet  112-127 ft per set 8 sets   Assistive device None   Gait Pattern Step-through pattern   Ambulation Surface Level;Indoor   Gait Comments Vitals range HR 109-133bpm BP 134/89     Lumbar Exercises: Aerobic   Elliptical NuStep 8 minutes: level 2, seat 14, arms 11 (EOS no charge, did assess HR/vitals halfway and at EOS)     Knee/Hip Exercises: Standing   Stairs 4in reciprocal pattern ascend and descend; reciprocal pattern 7in then down 4in reciprocal pattern 1RT each                      PT Short Term Goals - 05/29/16 1627      PT SHORT TERM GOAL #1   Title Patient to be independent in all bed mobility in order to improve function and QOL at home    Baseline 9/5- reports he has to do bed mobilty on his own at home    Time 4   Period Weeks   Status Achieved     PT SHORT TERM GOAL #2   Title Patient to be able to tolerate at least 5 minutes of unsupported standing with RPE no more than 5/10 in order to demonstrate improved functional activity  tolerance and balance    Baseline 9/5- reports able to perform ADLS unsupported at home   Time 4   Period Weeks   Status Achieved     PT SHORT TERM GOAL #3   Title Patient to be able to ambulate at least 470f with LRAD and Mod(I), RPE no more than 5/10 in order to demonstrate improved mobility and QOL    Baseline 9/5- 452 with walker, high fatigue    Time 4   Period Weeks   Status Partially Met     PT SHORT TERM GOAL #4   Title Patient to be able to perform stand-pivot transfers with LRAD on a Mod(I) basis in order to improve QOL and function at home    Period Weeks   Status Achieved     PT SHORT TERM GOAL #5   Title Patient to be indepedent in correctly and consistently performing appropriate HEP, to be updated PRN   Baseline 9/5- reports compliance    Time 4   Period Weeks   Status Achieved           PT Long Term Goals - 05/29/16 1627      PT LONG TERM GOAL #1    Title Patient to demonstrate proximal muscle strength at least 4/5 in order to assist in improving balance and tolerance to closed chain activities such as gait    Time 8   Period Weeks   Status On-going     PT LONG TERM GOAL #2   Title Patient to be able to tolerate 15 minutes of unsupported standing in order to demonstrate improved functional activity tolernace and strength, assist in self care based activities    Time 8   Period Weeks   Status On-going     PT LONG TERM GOAL #3   Title Patient to be able to ambulate at least 10067fwith LRAD, RPE no more than 5/10, with Mod(I) in order to improve general mobility and allow patient to access community    Time 8   Period Weeks   Status On-going     PT LONG TERM GOAL #4   Title Patient to be able to hold unsupported tandem stance at least 60 seconds B and SLS at least 30 seconds B in order to demonstrate improved balance and reduced fall risk    Time 8   Period Weeks   Status On-going     PT LONG TERM GOAL #5   Title Patient to be educated on benefits of and participatory in water exercise program at least 2 times per week in order to maintain functional gains and promote regular activity within safe and well tolerated environment   Time 8   Period Weeks   Status On-going               Plan - 06/18/16 1747    Clinical Impression Statement Pt progressing well with reports of increased tolerance with gait with reports of increased walking around his home, ability to walk to church on Sunday and has completed stair training at his parent's house.  Continued session focus on gait training with assigned rest breaks through session to asssess vitals with good results.  Pt presents with improved gait quality, improved posture, HR range from 109-133 bmp and fatigue level stayed between 50-60%.  Stair training complete with close guard with highest HR to 133 bmp followiong 2 sets of 4in then 7in reciprocal pattern with 1 HR.  Pt able to  complete floor  to standing this session indepdendently (Supervison) with only assistance being UE A with mat today and HR at 112bmp following transfer.     Rehab Potential Good   Clinical Impairments Affecting Rehab Potential Necrotizing Autoimmune Myopathy (progressive nature of disease process), chronic steroid use, chronic cardiovascular deconditioning.    PT Frequency 2x / week   PT Duration Other (comment)   PT Treatment/Interventions ADLs/Self Care Home Management;Biofeedback;Cryotherapy;Moist Heat;DME Instruction;Gait training;Stair training;Functional mobility training;Therapeutic activities;Therapeutic exercise;Balance training;Neuromuscular re-education;Patient/family education;Manual techniques;Energy conservation;Taping   PT Next Visit Plan Reassess next session.  continue strength training at 50% effort on cybex machines, proximal strength, and quadruped tasks;       Patient will benefit from skilled therapeutic intervention in order to improve the following deficits and impairments:  Abnormal gait, Improper body mechanics, Cardiopulmonary status limiting activity, Decreased coordination, Decreased mobility, Postural dysfunction, Decreased activity tolerance, Decreased endurance, Decreased strength, Decreased balance, Difficulty walking, Impaired flexibility  Visit Diagnosis: Other symptoms and signs involving the musculoskeletal system  Difficulty in walking, not elsewhere classified  Unsteadiness on feet  Muscle weakness (generalized)  Unsteady gait     Problem List Patient Active Problem List   Diagnosis Date Noted  . Bilateral leg weakness 01/24/2015  . Elevated CPK 01/24/2015  . Transaminitis 10/24/2014   Ihor Austin, [place]; [place]  Aldona Lento 06/18/2016, 6:01 PM  [place] [address] [place], [place], [address] Phone: [phone]   Fax:  [phone]  Name: ELIAV MECHLING MRN:  [id number] Date of Birth: Aug 23, 1955

## 2016-06-18 NOTE — Therapy (Signed)
[place], [place], [address] Phone: [phone]   Fax:  [phone]  Occupational Therapy Treatment  Patient Details  Name: Jacob Hunter MRN: [id number] Date of Birth: 03-Nov-1955 Referring Provider: Dr. Asencion Noble  Encounter Date: 06/18/2016      OT End of Session - 06/18/16 1513    Visit Number 22   Number of Visits 24   Date for OT Re-Evaluation 06/22/16   Authorization Type [employer]   Authorization Time Period Visit limit 60 - 0 used   Authorization - Visit Number 22   Authorization - Number of Visits 60   OT Start Time 1430   OT Stop Time 1515   OT Time Calculation (min) 45 min   Activity Tolerance Patient tolerated treatment well   Behavior During Therapy [facility] for tasks assessed/performed      Past Medical History:  Diagnosis Date  . Arthritis   . Hypercholesteremia   . Knee pain   . Necrotizing myopathy   . Polymyositis [facility])     Past Surgical History:  Procedure Laterality Date  . ANKLE SURGERY Left   . KNEE SURGERY Left     There were no vitals filed for this visit.      Subjective Assessment - 06/18/16 1434    Subjective  S: Nothing new to report.   Currently in Pain? No/denies                      OT Treatments/Exercises (OP) - 06/18/16 1435      Exercises   Exercises Shoulder     Shoulder Exercises: Standing   Protraction Theraband;20 reps   Theraband Level (Shoulder Protraction) Level 3 (Green)   Horizontal ABduction Theraband;20 reps;Both   Theraband Level (Shoulder Horizontal ABduction) Level 3 (Green)   External Rotation Theraband;20 reps;Both   Theraband Level (Shoulder External Rotation) Level 3 (Green)   Internal Rotation Theraband;20 reps;Both   Theraband Level (Shoulder Internal Rotation) Level 3 (Green)   Flexion Theraband;20 reps;Both   Theraband Level (Shoulder Flexion) Level 3 (Green)   ABduction Theraband;20 reps;Both   Theraband Level (Shoulder ABduction) Level 3 (Green)   Extension Theraband;20 reps   Theraband Level (Shoulder Extension) Level 3 (Green)   Row Theraband;20 reps   Theraband Level (Shoulder Row) Level 3 (Green)   Retraction Theraband;20 reps   Theraband Level (Shoulder Retraction) Level 3 (Green)   Other Standing Exercises T arms facing Band tower; 20X; Both; green band     Shoulder Exercises: ROM/Strengthening   Other ROM/Strengthening Exercises Arms on fire; 6 positions; 20 seconds each                  OT Short Term Goals - 05/23/16 1131      OT SHORT TERM GOAL #1   Title Patient will be educated and independent with HEP to increase functional performance during daily tasks.    Time 4   Period Weeks     OT SHORT TERM GOAL #2   Title Patient will increase BUE shoulder and elbow strength to 4/5 to increase ability to complete lightweight lifting activities.   Time 4   Period Weeks   Status Partially Met     OT SHORT TERM GOAL #3   Title Patient will increase overal activity tolerance by completing a simple household task for 10-15 minutes without increased fatigue.    Time 4   Period Weeks     OT SHORT  TERM GOAL #4   Title Patient will increase overall core strength to increase ability to complete sit to stands with less difficulty.     Time 4   Period Weeks           OT Long Term Goals - 05/28/16 1655      OT LONG TERM GOAL #1   Title Pt will return to prior level of independence and functioning during daily and work tasks.    Time 8   Period Weeks   Status On-going     OT LONG TERM GOAL #2   Title Patient will increase BUE shoulder and elbow strength to 4+/5 or greater to increase ability to complete work related tasks as well as be able to get up from off the floor as needed.   Time 8   Period Weeks   Status On-going     OT LONG TERM GOAL #3   Title Patient will increase overal activity tolerance by completing a simple household task for 30-35  minutes without increased fatigue.    Time 8   Period Weeks   Status On-going     OT LONG TERM GOAL #4   Title Patient will increase BUE grip strength to within average norms for age in order to be able to open tight lids or containers with less difficulty.    Time 8   Period Weeks               Plan - 06/18/16 1513    Clinical Impression Statement A: Pt was able to complete standing theraband exercises this session as well as increase time frame of positional holds during Arms on Fire.    Plan P: Reassessment.       Patient will benefit from skilled therapeutic intervention in order to improve the following deficits and impairments:  Decreased endurance, Decreased activity tolerance, Decreased strength  Visit Diagnosis: Other symptoms and signs involving the musculoskeletal system    Problem List Patient Active Problem List   Diagnosis Date Noted  . Bilateral leg weakness 01/24/2015  . Elevated CPK 01/24/2015  . Transaminitis 10/24/2014   Ailene Ravel, OTR/L,CBIS  [phone]  06/18/2016, 3:17 PM  [facility] [address] [place], [place], [address] Phone: [phone]   Fax:  [phone]  Name: Jacob Hunter MRN: [id number] Date of Birth: 02-08-1956

## 2016-06-25 ENCOUNTER — Ambulatory Visit (HOSPITAL_COMMUNITY): Payer: 59

## 2016-06-25 ENCOUNTER — Encounter (HOSPITAL_COMMUNITY): Payer: Self-pay

## 2016-06-25 DIAGNOSIS — R29898 Other symptoms and signs involving the musculoskeletal system: Secondary | ICD-10-CM | POA: Diagnosis not present

## 2016-06-25 DIAGNOSIS — R2681 Unsteadiness on feet: Secondary | ICD-10-CM

## 2016-06-25 DIAGNOSIS — M6281 Muscle weakness (generalized): Secondary | ICD-10-CM

## 2016-06-25 DIAGNOSIS — R262 Difficulty in walking, not elsewhere classified: Secondary | ICD-10-CM

## 2016-06-25 NOTE — Therapy (Signed)
[place], [place], [address] Phone: [phone]   Fax:  [phone]  Occupational Therapy Treatment And reassessment Patient Details  Name: Jacob Hunter MRN: [id number] Date of Birth: 10-01-1955 Referring Provider: Dr. Asencion Noble  Encounter Date: 06/25/2016      OT End of Session - 06/25/16 1421    Visit Number 23   Number of Visits 24   Date for OT Re-Evaluation 06/22/16   Authorization Type [employer]   Authorization Time Period Visit limit 60 - 0 used   Authorization - Visit Number 23   Authorization - Number of Visits 66   OT Start Time 1350  reassessment   OT Stop Time 1430   OT Time Calculation (min) 40 min   Activity Tolerance Patient tolerated treatment well   Behavior During Therapy [facility] for tasks assessed/performed      Past Medical History:  Diagnosis Date  . Arthritis   . Hypercholesteremia   . Knee pain   . Necrotizing myopathy   . Polymyositis [facility])     Past Surgical History:  Procedure Laterality Date  . ANKLE SURGERY Left   . KNEE SURGERY Left     There were no vitals filed for this visit.      Subjective Assessment - 06/25/16 1421    Subjective  S: I am not returning to work. I'm on disability now.   Currently in Pain? No/denies            [facility] OT Assessment - 06/25/16 1352      Assessment   Diagnosis BUE weakness     Precautions   Precautions Other (comment)   Precaution Comments CAN ONLY GO TO 50-60% OF MAX RIGHT NOW      Strength   Strength Assessment Site Shoulder   Right/Left Shoulder Left;Right   Right Shoulder Flexion 4+/5  previous: 4/5   Right Shoulder ABduction 4/5  previous: 4-/5   Right Shoulder Internal Rotation 4+/5  previous: 4-/5   Right Shoulder External Rotation 3+/5  previous: same   Right Shoulder Horizontal ABduction 4+/5  previous: 4-/5   Right Shoulder Horizontal ADduction 4+/5  previous: 4-/5   Left Shoulder Flexion  4+/5  previous: 4-/5   Left Shoulder ABduction 4/5  previous: 4-/5   Left Shoulder Internal Rotation 5/5  previous: 4+/5   Left Shoulder External Rotation 3+/5  previous: same   Left Shoulder Horizontal ABduction 3+/5  previous: same   Left Shoulder Horizontal ADduction 4-/5  previous: same                  OT Treatments/Exercises (OP) - 06/25/16 1427      Transfers   Comments Pt was able to transfer to the floor; supine and transition to 4 point and back to standing with assistance from the met table at Mod I level.      Exercises   Exercises Shoulder     Shoulder Exercises: Standing   External Rotation Theraband;20 reps;Both   Theraband Level (Shoulder External Rotation) Level 3 (Green)   Internal Rotation Theraband;20 reps;Both   Theraband Level (Shoulder Internal Rotation) Level 3 (Green)   Flexion Theraband;20 reps;Both   Theraband Level (Shoulder Flexion) Level 3 (Green)   Extension Theraband;20 reps   Theraband Level (Shoulder Extension) Level 3 (Green)   Row Theraband;20 reps   Theraband Level (Shoulder Row) Level 3 (Green)   Retraction Theraband;20 reps   Theraband Level (Shoulder Retraction) Level 3 (Green)  OT Education - 06/25/16 1418    Education provided Yes   Education Details Green theraband strengthening exercises. patient was given blue theraband as well to progress to.    Person(s) Educated Patient   Methods Explanation;Demonstration;Handout;Verbal cues   Comprehension Returned demonstration;Verbalized understanding          OT Short Term Goals - 06/25/16 1358      OT SHORT TERM GOAL #1   Title     Time 4   Period Weeks     OT SHORT TERM GOAL #2   Title Patient will increase BUE shoulder and elbow strength to 4/5 to increase ability to complete lightweight lifting activities.   Time 4   Period Weeks   Status Achieved     OT SHORT TERM GOAL #3   Title Patient will increase overal activity tolerance by  completing a simple household task for 10-15 minutes without increased fatigue.    Time 4   Period Weeks     OT SHORT TERM GOAL #4   Title Patient will increase overall core strength to increase ability to complete sit to stands with less difficulty.     Time 4   Period Weeks           OT Long Term Goals - 06/25/16 1359      OT LONG TERM GOAL #1   Title Pt will return to prior level of independence and functioning during daily and work tasks.    Time 8   Period Weeks   Status Achieved     OT LONG TERM GOAL #2   Title Patient will increase BUE shoulder and elbow strength to 4+/5 or greater to increase ability as be able to get up from off the floor as needed.   Time 8   Period Weeks   Status Partially Met     OT LONG TERM GOAL #3   Title Patient will increase overall activity tolerance by completing a simple household task for 30-35 minutes without increased fatigue.    Time 8   Period Weeks   Status Achieved     OT LONG TERM GOAL #4   Title Patient will increase BUE grip strength to within average norms for age in order to be able to open tight lids or containers with less difficulty.    Time 8   Period Weeks               Plan - 06/25/16 1421    Clinical Impression Statement A: Reassessment completed this date. Patient has met all OT goals. Pt reports that he will not be returning to work and is now on disability. HEP was updated and new exercises were reviewed. Patient is in agreement with discharge recommendation.    Plan P: D/C from OT services with HEP.      Patient will benefit from skilled therapeutic intervention in order to improve the following deficits and impairments:  Decreased endurance, Decreased activity tolerance, Decreased strength  Visit Diagnosis: Other symptoms and signs involving the musculoskeletal system    Problem List Patient Active Problem List   Diagnosis Date Noted  . Bilateral leg weakness 01/24/2015  . Elevated CPK  01/24/2015  . Transaminitis 10/24/2014    OCCUPATIONAL THERAPY DISCHARGE SUMMARY  Visits from Start of Care: 23  Current functional level related to goals / functional outcomes: See above   Remaining deficits: See above   Education / Equipment: See above Plan: Patient agrees to discharge.  Patient goals were  met. Patient is being discharged due to meeting the stated rehab goals.  ?????        Ailene Ravel, OTR/L,CBIS  [phone]  06/25/2016, 2:36 PM  [place] [address] [place], [place], [address] Phone: [phone]   Fax:  [phone]  Name: Jacob Hunter MRN: [id number] Date of Birth: February 03, 1956

## 2016-06-25 NOTE — Patient Instructions (Signed)
(  Home) Extension: Isometric / Bilateral Arm Retraction - Sitting   Facing anchor, hold hands and elbow at shoulder height, with elbow bent.  Pull arms back to squeeze shoulder blades together. Repeat 20  times.  Copyright  [employer]. All rights reserved.   (Home) Retraction: Row - Bilateral (Anchor)   Facing anchor, arms reaching forward, pull hands toward stomach, keeping elbows bent and at your sides and pinching shoulder blades together. Repeat 20 times.  Copyright  VHI. All rights reserved.   (Clinic) Extension / Flexion (Assist)   Face anchor, pull arms back, keeping elbow straight, and squeze shoulder blades together. Repeat 20 times.   Copyright  VHI. All rights reserved.   Flexion >90 with t-band  Attach band at aprox. shoulder level.  Stand facing band holding arm straight out in front of you. Bring band up and overhead.  Be sure to lower band slowly.  Repeat 20 reps.    ELASTIC BAND SHOULDER ABDUCTION  While holding an elastic band at your side, draw up your arm to the side keeping your elbow straight.  Repeat 20 reps.   ELASTIC BAND SHOULDER EXTERNAL ROTATION - ER  While holding an elastic band at your side with your elbow bent, start with your hand near your stomach and then pull the band away. Keep your elbow at your side the entire time. Repeat 20 reps   ELASTIC BAND SHOULDER INTERNAL ROTATION - IR  While holding an elastic band at your side with your elbow bent, start with your hand away from your stomach, then pull the band towards your stomach. Keep your elbow near your side the entire time.   Repeat 20 reps

## 2016-06-25 NOTE — Therapy (Signed)
[place], [place], [address] Phone: [phone]   Fax:  [phone]  Physical Therapy Treatment  Patient Details  Name: Jacob Hunter MRN: [id number] Date of Birth: 08/15/1955 Referring Provider: Dr. Asencion Noble  Encounter Date: 06/25/2016      PT End of Session - 06/25/16 1437    Visit Number 24   Number of Visits 32   Date for PT Re-Evaluation 06/16/16   Authorization Type [employer] (60 visits between PT/OT/speech)   Authorization Time Period 03/18/16 to 05/18/16; 05-18-11/01/2016   PT Start Time 1437   PT Stop Time 1515   PT Time Calculation (min) 38 min   Equipment Utilized During Treatment Gait belt   Activity Tolerance Patient tolerated treatment well;Patient limited by fatigue  Fatigue level at EOS 60-65%   Behavior During Therapy [facility] for tasks assessed/performed      Past Medical History:  Diagnosis Date  . Arthritis   . Hypercholesteremia   . Knee pain   . Necrotizing myopathy   . Polymyositis [facility])     Past Surgical History:  Procedure Laterality Date  . ANKLE SURGERY Left   . KNEE SURGERY Left     There were no vitals filed for this visit.      Subjective Assessment - 06/25/16 1435    Subjective Pt stated he has been walking around the house and picking up objects, wishes to work on balance this session.     Currently in Pain? No/denies                         [facility] Adult PT Treatment/Exercise - 06/25/16 1558      Knee/Hip Exercises: Standing   Stairs 4 and 7in reciprocal pattern 3RT with least HHA each set   Other Standing Knee Exercises Tall kneeling x 1 min           Balance Exercises - 06/25/16 1503      Balance Exercises: Standing   Tandem Stance Eyes open;3 reps;30 secs  2 sets on foam   SLS Eyes open;3 reps  Lt 16", rt 18" max of 3   Sidestepping 2 reps;Theraband  GTB   Cone Rotation Foam/compliant surface  tanden stance  on foam therabll rot then overhead   Other Standing Exercises standing on foam red theraball rotation then UE flexion overhead in NBOS             PT Short Term Goals - 05/29/16 1627      PT SHORT TERM GOAL #1   Title Patient to be independent in all bed mobility in order to improve function and QOL at home    Baseline 9/5- reports he has to do bed mobilty on his own at home    Time 4   Period Weeks   Status Achieved     PT SHORT TERM GOAL #2   Title Patient to be able to tolerate at least 5 minutes of unsupported standing with RPE no more than 5/10 in order to demonstrate improved functional activity tolerance and balance    Baseline 9/5- reports able to perform ADLS unsupported at home   Time 4   Period Weeks   Status Achieved     PT SHORT TERM GOAL #3   Title Patient to be able to ambulate at least 461f with LRAD and Mod(I), RPE no more than 5/10 in order to demonstrate improved mobility and QOL  Baseline 9/5- 452 with walker, high fatigue    Time 4   Period Weeks   Status Partially Met     PT SHORT TERM GOAL #4   Title Patient to be able to perform stand-pivot transfers with LRAD on a Mod(I) basis in order to improve QOL and function at home    Period Weeks   Status Achieved     PT SHORT TERM GOAL #5   Title Patient to be indepedent in correctly and consistently performing appropriate HEP, to be updated PRN   Baseline 9/5- reports compliance    Time 4   Period Weeks   Status Achieved           PT Long Term Goals - 05/29/16 1627      PT LONG TERM GOAL #1   Title Patient to demonstrate proximal muscle strength at least 4/5 in order to assist in improving balance and tolerance to closed chain activities such as gait    Time 8   Period Weeks   Status On-going     PT LONG TERM GOAL #2   Title Patient to be able to tolerate 15 minutes of unsupported standing in order to demonstrate improved functional activity tolernace and strength, assist in self care  based activities    Time 8   Period Weeks   Status On-going     PT LONG TERM GOAL #3   Title Patient to be able to ambulate at least 1025f with LRAD, RPE no more than 5/10, with Mod(I) in order to improve general mobility and allow patient to access community    Time 8   Period Weeks   Status On-going     PT LONG TERM GOAL #4   Title Patient to be able to hold unsupported tandem stance at least 60 seconds B and SLS at least 30 seconds B in order to demonstrate improved balance and reduced fall risk    Time 8   Period Weeks   Status On-going     PT LONG TERM GOAL #5   Title Patient to be educated on benefits of and participatory in water exercise program at least 2 times per week in order to maintain functional gains and promote regular activity within safe and well tolerated environment   Time 8   Period Weeks   Status On-going               Plan - 06/25/16 1744    Clinical Impression Statement Pt progressing well with reports of increased gait at home.  Session focus on improving core and functional strenghtening as well as balance training.  Monitored vitals (HR range from 114-132) and fatigue level (50-65% per pt.) through session.  Pt improving activity tolerance with less rest breaks required and functinal strengthening with abilty to ambulate reciprocal pattern on 7in step height with minimal intermittent HHA for balance.  Static and dynamic balance activities complete with therapist SBA for safety.     Rehab Potential Good   Clinical Impairments Affecting Rehab Potential Necrotizing Autoimmune Myopathy (progressive nature of disease process), chronic steroid use, chronic cardiovascular deconditioning.    PT Frequency 2x / week   PT Duration Other (comment)   PT Treatment/Interventions ADLs/Self Care Home Management;Biofeedback;Cryotherapy;Moist Heat;DME Instruction;Gait training;Stair training;Functional mobility training;Therapeutic activities;Therapeutic  exercise;Balance training;Neuromuscular re-education;Patient/family education;Manual techniques;Energy conservation;Taping   PT Next Visit Plan Reassess next session.  continue strength training at 50% effort on cybex machines, proximal strength, and quadruped tasks;  Patient will benefit from skilled therapeutic intervention in order to improve the following deficits and impairments:  Abnormal gait, Improper body mechanics, Cardiopulmonary status limiting activity, Decreased coordination, Decreased mobility, Postural dysfunction, Decreased activity tolerance, Decreased endurance, Decreased strength, Decreased balance, Difficulty walking, Impaired flexibility  Visit Diagnosis: Other symptoms and signs involving the musculoskeletal system  Difficulty in walking, not elsewhere classified  Unsteadiness on feet  Muscle weakness (generalized)  Unsteady gait     Problem List Patient Active Problem List   Diagnosis Date Noted  . Bilateral leg weakness 01/24/2015  . Elevated CPK 01/24/2015  . Transaminitis 10/24/2014   Ihor Austin, LPTA; [place]  Aldona Lento 06/25/2016, [place] [address] [place], [place], [address] Phone: [phone]   Fax:  [phone]  Name: TASMAN ZAPATA MRN: [id number] Date of Birth: 04-Sep-1955

## 2016-07-02 ENCOUNTER — Encounter (HOSPITAL_COMMUNITY): Payer: 59

## 2016-07-02 ENCOUNTER — Ambulatory Visit (HOSPITAL_COMMUNITY): Payer: 59 | Admitting: Physical Therapy

## 2016-07-02 DIAGNOSIS — M6281 Muscle weakness (generalized): Secondary | ICD-10-CM

## 2016-07-02 DIAGNOSIS — R262 Difficulty in walking, not elsewhere classified: Secondary | ICD-10-CM

## 2016-07-02 DIAGNOSIS — R29898 Other symptoms and signs involving the musculoskeletal system: Secondary | ICD-10-CM | POA: Diagnosis not present

## 2016-07-02 DIAGNOSIS — R2681 Unsteadiness on feet: Secondary | ICD-10-CM

## 2016-07-02 NOTE — Therapy (Signed)
[place], [place], [address] Phone: [phone]   Fax:  [phone]  Physical Therapy Treatment (Re-Assessment)  Patient Details  Name: Jacob Hunter MRN: [id number] Date of Birth: Feb 27, 1956 Referring Provider: Asencion Noble   Encounter Date: 07/02/2016      PT End of Session - 07/02/16 1724    Visit Number 25   Number of Visits 29   Date for PT Re-Evaluation 07/30/16   Authorization Type [employer] (60 visits between PT/OT/speech)   Authorization Time Period 03/18/16 to 05/18/16; 05-18-11/01/2016   PT Start Time 1432   PT Stop Time 1515   PT Time Calculation (min) 43 min   Equipment Utilized During Treatment Gait belt   Activity Tolerance Patient tolerated treatment well;Patient limited by fatigue   Behavior During Therapy [facility] for tasks assessed/performed      Past Medical History:  Diagnosis Date  . Arthritis   . Hypercholesteremia   . Knee pain   . Necrotizing myopathy   . Polymyositis [facility])     Past Surgical History:  Procedure Laterality Date  . ANKLE SURGERY Left   . KNEE SURGERY Left     There were no vitals filed for this visit.      Subjective Assessment - 07/02/16 1436    Subjective Patient arrives today stating that his MD has added prednisone; 6 weeks ago his enzymes where 2803, now they are 3520 as of Monday. He has not noticed a physical change but his MD is not happy that enzymes are going back up. The MD thinks that the enzymes are related to how much he is doing physically. His fatigue has been up, he has been sleeping a lot; he does still get a bit of a shake. Patient confirms thatt he really does feel like he is doing very well physically. He does state that he went to the football game Friday night and had to go up 16 steps, he had to stop and rest, it was a struggle to get up them. He states that he feels he would benefit from continuing to come especially since  his enzymes are going back up.    Pertinent History history of L knee arthroscopic surgery, history of L ankle surgery, no other signifcant PMH    How long can you sit comfortably? 11/21- unlimited    How long can you stand comfortably? 11/21- 20 minutes    How long can you walk comfortably? 11/21- few hundred feet    Patient Stated Goals improve mobilty, get stronger    Currently in Pain? No/denies            [facility] PT Assessment - 07/02/16 1447      Strength   Right Hip Flexion 3/5   Right Hip Extension 2/5   Right Hip ABduction 3/5   Left Hip Flexion 3/5   Left Hip Extension 2/5   Left Hip ABduction 3/5   Right Knee Flexion 5/5   Right Knee Extension 5/5   Left Knee Flexion 5/5   Left Knee Extension 5/5   Right Ankle Dorsiflexion 5/5   Left Ankle Dorsiflexion 5/5     6 minute walk test results    Aerobic Endurance Distance Walked 502   Endurance additional comments 3MWT           SLS 1 minute modified L LE, 2 minutes modified R LE  PT Education - 07/02/16 1720    Education provided Yes   Education Details progress, POC, difference between skilled PT and maintenance care, water exercise at [facility], possible DC in 4 weeks if no progress    Person(s) Educated Patient   Methods Explanation   Comprehension Verbalized understanding          PT Short Term Goals - 07/02/16 1726      PT SHORT TERM GOAL #1   Title Patient to be independent in all bed mobility in order to improve function and QOL at home    Time 4   Period Weeks   Status Achieved     PT SHORT TERM GOAL #2   Title Patient to be able to tolerate at least 5 minutes of unsupported standing with RPE no more than 5/10 in order to demonstrate improved functional activity tolerance and balance    Time 4   Period Weeks   Status Achieved     PT SHORT TERM GOAL #3   Title Patient to be able to ambulate at least 442f with LRAD and Mod(I), RPE no more than 5/10 in order to  demonstrate improved mobility and QOL    Time 4   Period Weeks   Status Achieved     PT SHORT TERM GOAL #4   Title Patient to be able to perform stand-pivot transfers with LRAD on a Mod(I) basis in order to improve QOL and function at home    Time 4   Period Weeks   Status Achieved     PT SHORT TERM GOAL #5   Title Patient to be indepedent in correctly and consistently performing appropriate HEP, to be updated PRN   Time 4   Period Weeks   Status Achieved           PT Long Term Goals - 07/02/16 1726      PT LONG TERM GOAL #1   Title Patient to demonstrate proximal muscle strength at least 4/5 in order to assist in improving balance and tolerance to closed chain activities such as gait    Time 8   Period Weeks   Status On-going     PT LONG TERM GOAL #2   Title Patient to be able to tolerate 15 minutes of unsupported standing in order to demonstrate improved functional activity tolernace and strength, assist in self care based activities    Time 8   Period Weeks   Status On-going     PT LONG TERM GOAL #3   Title Patient to be able to ambulate at least 10028fwith LRAD, RPE no more than 5/10, with Mod(I) in order to improve general mobility and allow patient to access community    Time 8   Period Weeks   Status On-going     PT LONG TERM GOAL #4   Title Patient to be able to hold unsupported tandem stance at least 60 seconds B and SLS at least 30 seconds B in order to demonstrate improved balance and reduced fall risk    Time 8   Period Weeks   Status Partially Met     PT LONG TERM GOAL #5   Title Patient to be educated on benefits of and participatory in water exercise program at least 2 times per week in order to maintain functional gains and promote regular activity within safe and well tolerated environment   Time 8   Period Weeks   Status On-going  Plan - 07/02/16 1724    Clinical Impression Statement Re-assessment performed today. Patient  states that he is doing much better overall however he and his MD are concerned over his enzymes as they have been starting to rise again; they are trying to address this with an increased dose of steroids and he is due for another blood check in early December. Upon examination, patient shows excellent progress in gait mechanics/tolerance, functional strength, and also shows improved balance skills today. He does state he is concerned about possibly back tracking with DC from PT and shows good motivation to continue. He did describe a couple of episodes of possible orthostatic symptoms at home; took orthostatic measures in sitting (148/98) with immediate transition to standing (134/92), will continue to monitor and assess as necessary. Discussed difference between skilled therapeutic PT and non-skilled maintenance exercise, as well as providing encouragement and materials for water exercise at [facility], likely DC in 4 more weeks if no changes at next objective measure system. Recommend brief extension of skilled PT services with possible upcoming DC.    Rehab Potential Good   Clinical Impairments Affecting Rehab Potential Necrotizing Autoimmune Myopathy (progressive nature of disease process), chronic steroid use, chronic cardiovascular deconditioning.    PT Frequency 1x / week   PT Duration 4 weeks   PT Treatment/Interventions ADLs/Self Care Home Management;Biofeedback;Cryotherapy;Moist Heat;DME Instruction;Gait training;Stair training;Functional mobility training;Therapeutic activities;Therapeutic exercise;Balance training;Neuromuscular re-education;Patient/family education;Manual techniques;Energy conservation;Taping   PT Next Visit Plan continue strength training at 50% effort, proximal strength, balance based tasks, continue to encoruage independent exercise at [place] No updates.    Consulted and Agree with Plan of Care Patient      Patient will benefit from skilled therapeutic  intervention in order to improve the following deficits and impairments:  Abnormal gait, Improper body mechanics, Cardiopulmonary status limiting activity, Decreased coordination, Decreased mobility, Postural dysfunction, Decreased activity tolerance, Decreased endurance, Decreased strength, Decreased balance, Difficulty walking, Impaired flexibility  Visit Diagnosis: Other symptoms and signs involving the musculoskeletal system - Plan: PT plan of care cert/re-cert  Difficulty in walking, not elsewhere classified - Plan: PT plan of care cert/re-cert  Unsteadiness on feet - Plan: PT plan of care cert/re-cert  Muscle weakness (generalized) - Plan: PT plan of care cert/re-cert     Problem List Patient Active Problem List   Diagnosis Date Noted  . Bilateral leg weakness 01/24/2015  . Elevated CPK 01/24/2015  . Transaminitis 10/24/2014    Deniece Ree PT, DPT [place] [address] [place], [place], [address] Phone: [phone]   Fax:  [phone]  Name: AKILI CORSETTI MRN: [id number] Date of Birth: [birth year]

## 2016-07-09 ENCOUNTER — Encounter (HOSPITAL_COMMUNITY): Payer: 59

## 2016-07-09 ENCOUNTER — Ambulatory Visit (HOSPITAL_COMMUNITY): Payer: 59

## 2016-07-09 DIAGNOSIS — R29898 Other symptoms and signs involving the musculoskeletal system: Secondary | ICD-10-CM

## 2016-07-09 DIAGNOSIS — R2681 Unsteadiness on feet: Secondary | ICD-10-CM

## 2016-07-09 DIAGNOSIS — R262 Difficulty in walking, not elsewhere classified: Secondary | ICD-10-CM

## 2016-07-09 DIAGNOSIS — M6281 Muscle weakness (generalized): Secondary | ICD-10-CM

## 2016-07-09 NOTE — Therapy (Signed)
[place], [place], [address] Phone: [phone]   Fax:  [phone]  Physical Therapy Treatment  Patient Details  Name: Jacob Hunter MRN: [id number] Date of Birth: 1955/09/21 Referring Provider: Asencion Noble   Encounter Date: 07/09/2016      PT End of Session - 07/09/16 1437    Visit Number 26   Number of Visits 29   Date for PT Re-Evaluation 07/30/16   Authorization Type [employer] (60 visits between PT/OT/speech)   Authorization Time Period 03/18/16 to 05/18/16; 05-18-11/01/2016   PT Start Time 1433   PT Stop Time 1516   PT Time Calculation (min) 43 min   Equipment Utilized During Treatment Gait belt   Activity Tolerance Patient tolerated treatment well;Patient limited by fatigue   Behavior During Therapy [facility] for tasks assessed/performed      Past Medical History:  Diagnosis Date  . Arthritis   . Hypercholesteremia   . Knee pain   . Necrotizing myopathy   . Polymyositis [facility])     Past Surgical History:  Procedure Laterality Date  . ANKLE SURGERY Left   . KNEE SURGERY Left     There were no vitals filed for this visit.      Subjective Assessment - 07/09/16 1436    Subjective Pt stated he has minimal difficulty wtih functional activities, main difficulty with endurance while walking.     Pertinent History history of L knee arthroscopic surgery, history of L ankle surgery, no other signifcant PMH    Patient Stated Goals improve mobilty, get stronger    Currently in Pain? No/denies               [facility] Adult PT Treatment/Exercise - 07/09/16 0001      Ambulation/Gait   Ambulation/Gait Yes   Ambulation/Gait Assistance 5: Supervision   Ambulation Distance (Feet) 1132 Feet  5 sets initially and final lap 466f then 226 per set   Gait Comments Vitals range HR 101-133bmp fatigue level subjectively range from 50-60% whole session     Lumbar Exercises: Standing   Other  Standing Lumbar Exercises Able to pick up phone from floor independebntly with SBA     Lumbar Exercises: Quadruped   Opposite Arm/Leg Raise Right arm/Left leg;Left arm/Right leg;10 reps   Other Quadruped Lumbar Exercises tall kneeling 30"; tall kneeling with alternating UE flexion x 60"             Balance Exercises - 07/09/16 1527      Balance Exercises: Standing   Sidestepping 2 reps;Theraband             PT Short Term Goals - 07/02/16 1726      PT SHORT TERM GOAL #1   Title Patient to be independent in all bed mobility in order to improve function and QOL at home    Time 4   Period Weeks   Status Achieved     PT SHORT TERM GOAL #2   Title Patient to be able to tolerate at least 5 minutes of unsupported standing with RPE no more than 5/10 in order to demonstrate improved functional activity tolerance and balance    Time 4   Period Weeks   Status Achieved     PT SHORT TERM GOAL #3   Title Patient to be able to ambulate at least 4038fwith LRAD and Mod(I), RPE no more than 5/10 in order to demonstrate improved mobility and QOL  Time 4   Period Weeks   Status Achieved     PT SHORT TERM GOAL #4   Title Patient to be able to perform stand-pivot transfers with LRAD on a Mod(I) basis in order to improve QOL and function at home    Time 4   Period Weeks   Status Achieved     PT SHORT TERM GOAL #5   Title Patient to be indepedent in correctly and consistently performing appropriate HEP, to be updated PRN   Time 4   Period Weeks   Status Achieved           PT Long Term Goals - 07/02/16 1726      PT LONG TERM GOAL #1   Title Patient to demonstrate proximal muscle strength at least 4/5 in order to assist in improving balance and tolerance to closed chain activities such as gait    Time 8   Period Weeks   Status On-going     PT LONG TERM GOAL #2   Title Patient to be able to tolerate 15 minutes of unsupported standing in order to demonstrate improved  functional activity tolernace and strength, assist in self care based activities    Time 8   Period Weeks   Status On-going     PT LONG TERM GOAL #3   Title Patient to be able to ambulate at least 1060f with LRAD, RPE no more than 5/10, with Mod(I) in order to improve general mobility and allow patient to access community    Time 8   Period Weeks   Status On-going     PT LONG TERM GOAL #4   Title Patient to be able to hold unsupported tandem stance at least 60 seconds B and SLS at least 30 seconds B in order to demonstrate improved balance and reduced fall risk    Time 8   Period Weeks   Status Partially Met     PT LONG TERM GOAL #5   Title Patient to be educated on benefits of and participatory in water exercise program at least 2 times per week in order to maintain functional gains and promote regular activity within safe and well tolerated environment   Time 8   Period Weeks   Status On-going               Plan - 07/09/16 1528    Clinical Impression Statement Session foucs on improving activity tolerance with gait and proximal musculature strengthening.  Vital HR and subjective fatigue levels addressed through session.  Pt making vast improvements with abilty to complete tall kneeling for a minute wiht UE movements wiht increased fatigue levels to 60%.  Pt stated main difficulty currently with endrance during gait.  Gait training complete as alternating between therex activities to address activity tolerance.  Pt with improved ability to complete 1,1386fover 5 sets through session (was 90430fast time).  No reports of pain through session, was limited by fatigue.  Do feel pt ready to begin gait on dynamic surfaces next session.    Rehab Potential Good   Clinical Impairments Affecting Rehab Potential Necrotizing Autoimmune Myopathy (progressive nature of disease process), chronic steroid use, chronic cardiovascular deconditioning.    PT Frequency 1x / week   PT Duration 4  weeks   PT Treatment/Interventions ADLs/Self Care Home Management;Biofeedback;Cryotherapy;Moist Heat;DME Instruction;Gait training;Stair training;Functional mobility training;Therapeutic activities;Therapeutic exercise;Balance training;Neuromuscular re-education;Patient/family education;Manual techniques;Energy conservation;Taping   PT Next Visit Plan Next session being gait training outdoors with extra A  for safety.  continue strength training at 50% effort, proximal strength, balance based tasks, continue to encoruage independent exercise at [facility]       Patient will benefit from skilled therapeutic intervention in order to improve the following deficits and impairments:  Abnormal gait, Improper body mechanics, Cardiopulmonary status limiting activity, Decreased coordination, Decreased mobility, Postural dysfunction, Decreased activity tolerance, Decreased endurance, Decreased strength, Decreased balance, Difficulty walking, Impaired flexibility  Visit Diagnosis: Other symptoms and signs involving the musculoskeletal system  Difficulty in walking, not elsewhere classified  Unsteadiness on feet  Muscle weakness (generalized)     Problem List Patient Active Problem List   Diagnosis Date Noted  . Bilateral leg weakness 01/24/2015  . Elevated CPK 01/24/2015  . Transaminitis 10/24/2014   Ihor Austin, [place]; [place]  Aldona Lento 07/09/2016, 3:36 PM  [place] [address] [place], [place], [address] Phone: [phone]   Fax:  [phone]  Name: Jacob Hunter MRN: [id number] Date of Birth: 1956/04/24

## 2016-07-17 ENCOUNTER — Ambulatory Visit (HOSPITAL_COMMUNITY): Payer: 59 | Attending: Internal Medicine | Admitting: Physical Therapy

## 2016-07-17 DIAGNOSIS — R29898 Other symptoms and signs involving the musculoskeletal system: Secondary | ICD-10-CM | POA: Insufficient documentation

## 2016-07-17 DIAGNOSIS — M6281 Muscle weakness (generalized): Secondary | ICD-10-CM | POA: Insufficient documentation

## 2016-07-17 DIAGNOSIS — R262 Difficulty in walking, not elsewhere classified: Secondary | ICD-10-CM

## 2016-07-17 DIAGNOSIS — R2681 Unsteadiness on feet: Secondary | ICD-10-CM | POA: Diagnosis present

## 2016-07-17 NOTE — Therapy (Signed)
[facility] [address] [place], [place], [address] Phone: [phone]   Fax:  [phone]  Physical Therapy Treatment (Discharge)  Patient Details  Name: Jacob Hunter MRN: [id number] Date of Birth: 10/12/1955 Referring Provider: Asencion Noble   Encounter Date: 07/17/2016      PT End of Session - 07/17/16 0943    Visit Number 27   Number of Visits 27   Authorization Type [employer] (60 visits between PT/OT/speech)   Authorization Time Period 03/18/16 to 05/18/16; 05-18-11/01/2016   PT Start Time 0902   PT Stop Time 0940   PT Time Calculation (min) 38 min   Equipment Utilized During Treatment Gait belt   Activity Tolerance Patient tolerated treatment well;Patient limited by fatigue   Behavior During Therapy [facility] for tasks assessed/performed      Past Medical History:  Diagnosis Date  . Arthritis   . Hypercholesteremia   . Knee pain   . Necrotizing myopathy   . Polymyositis [facility])     Past Surgical History:  Procedure Laterality Date  . ANKLE SURGERY Left   . KNEE SURGERY Left     There were no vitals filed for this visit.      Subjective Assessment - 07/17/16 0905    Subjective Patient reports that he is having another enzyme test on 12/12; he reports that he has trouble bending over to pick something heavier up off of the floor, he feels a little bit of a strain in his front.    Pertinent History history of L knee arthroscopic surgery, history of L ankle surgery, no other signifcant PMH    Patient Stated Goals improve mobilty, get stronger    Currently in Pain? No/denies            [facility] PT Assessment - 07/17/16 0001      Strength   Right Hip Flexion 3/5   Right Hip Extension 2/5   Right Hip ABduction 3/5   Left Hip Flexion 3/5   Left Hip Extension 2/5   Left Hip ABduction 3+/5   Right Knee Flexion 4+/5   Right Knee Extension 5/5   Left Knee Flexion 4+/5   Left Knee Extension 5/5   Right  Ankle Dorsiflexion 5/5   Left Ankle Dorsiflexion 5/5     6 minute walk test results    Aerobic Endurance Distance Walked 366   Endurance additional comments 3MWT                      [facility] Adult PT Treatment/Exercise - 07/17/16 0001      Therapeutic Activites    Other Therapeutic Activities functional lifting training with weighted objects in // bars with min guard, cues for form and safety                 PT Education - 07/17/16 0943    Education provided Yes   Education Details progress, DC today due to high level of function, correction of mechanics for picking up items and pet, HEP recommendations, continue walking especially on inclines, go to [facility] specifically for water exercise moving forward    Person(s) Educated Patient   Methods Explanation   Comprehension Verbalized understanding          PT Short Term Goals - 07/02/16 1726      PT SHORT TERM GOAL #1   Title Patient to be independent in all bed mobility in order to improve function and QOL  at home    Time 4   Period Weeks   Status Achieved     PT SHORT TERM GOAL #2   Title Patient to be able to tolerate at least 5 minutes of unsupported standing with RPE no more than 5/10 in order to demonstrate improved functional activity tolerance and balance    Time 4   Period Weeks   Status Achieved     PT SHORT TERM GOAL #3   Title Patient to be able to ambulate at least 487f with LRAD and Mod(I), RPE no more than 5/10 in order to demonstrate improved mobility and QOL    Time 4   Period Weeks   Status Achieved     PT SHORT TERM GOAL #4   Title Patient to be able to perform stand-pivot transfers with LRAD on a Mod(I) basis in order to improve QOL and function at home    Time 4   Period Weeks   Status Achieved     PT SHORT TERM GOAL #5   Title Patient to be indepedent in correctly and consistently performing appropriate HEP, to be updated PRN   Time 4   Period Weeks   Status Achieved            PT Long Term Goals - 07/02/16 1726      PT LONG TERM GOAL #1   Title Patient to demonstrate proximal muscle strength at least 4/5 in order to assist in improving balance and tolerance to closed chain activities such as gait    Time 8   Period Weeks   Status On-going     PT LONG TERM GOAL #2   Title Patient to be able to tolerate 15 minutes of unsupported standing in order to demonstrate improved functional activity tolernace and strength, assist in self care based activities    Time 8   Period Weeks   Status On-going     PT LONG TERM GOAL #3   Title Patient to be able to ambulate at least 10047fwith LRAD, RPE no more than 5/10, with Mod(I) in order to improve general mobility and allow patient to access community    Time 8   Period Weeks   Status On-going     PT LONG TERM GOAL #4   Title Patient to be able to hold unsupported tandem stance at least 60 seconds B and SLS at least 30 seconds B in order to demonstrate improved balance and reduced fall risk    Time 8   Period Weeks   Status Partially Met     PT LONG TERM GOAL #5   Title Patient to be educated on benefits of and participatory in water exercise program at least 2 times per week in order to maintain functional gains and promote regular activity within safe and well tolerated environment   Time 8   Period Weeks   Status On-going               Plan - 07/17/16 0944    Clinical Impression Statement Patient arrives today stating that it is his last scheduled session; he cannot think of anything functionally hard for him to do at home with exception of picking up his dog. Examined and corrected mechanics and safety of this with weighted items until patient able to perform this maneuver with min guard but good safety, good potential to perform safely on his own. Performed mini-reassessment and discussed exercise and activity recommendations moving forwards. DC today due to  high level of function.    Rehab  Potential Good   Clinical Impairments Affecting Rehab Potential Necrotizing Autoimmune Myopathy (progressive nature of disease process), chronic steroid use, chronic cardiovascular deconditioning.    PT Next Visit Plan DC today    Consulted and Agree with Plan of Care Patient      Patient will benefit from skilled therapeutic intervention in order to improve the following deficits and impairments:  Abnormal gait, Improper body mechanics, Cardiopulmonary status limiting activity, Decreased coordination, Decreased mobility, Postural dysfunction, Decreased activity tolerance, Decreased endurance, Decreased strength, Decreased balance, Difficulty walking, Impaired flexibility  Visit Diagnosis: Other symptoms and signs involving the musculoskeletal system  Difficulty in walking, not elsewhere classified  Unsteadiness on feet  Muscle weakness (generalized)     Problem List Patient Active Problem List   Diagnosis Date Noted  . Bilateral leg weakness 01/24/2015  . Elevated CPK 01/24/2015  . Transaminitis 10/24/2014   PHYSICAL THERAPY DISCHARGE SUMMARY  Visits from Start of Care: 26  Current functional level related to goals / functional outcomes: Patient doing very well in general, he cannot identify any functional limitations that would need to be worked on by skilled PT services and that he cannot do at home or in the gym safely on his own. Made HEP and general exercise/activity recommendations moving forward; DC today due to high level of function.    Remaining deficits: Reduced functional activity tolerance, proximal weakness, mild unsteadiness    Education / Equipment: HEP review, exercise and activity recommendations moving forward including water exercise at [facility]  Plan: Patient agrees to discharge.  Patient goals were partially met. Patient is being discharged due to meeting the stated rehab goals.  ?????      Deniece Ree PT, DPT [facility] [address] [place], [place], [address] Phone: [phone]   Fax:  [phone]  Name: JERUSALEM WERT MRN: [id number] Date of Birth: [birth year]

## 2017-09-14 IMAGING — CT CT NECK W/ CM
4 series · 15 of 33 positions shown, 18 images · IV contrast (Omnipaque 300)
Comparison: None.

CLINICAL DATA: Difficulty swallowing beginning 1 hour ago.
Difficulty breathing.

EXAM:
CT NECK WITH CONTRAST
TECHNIQUE: Multidetector CT imaging of the neck was performed using the
standard protocol following the bolus administration of intravenous
contrast.
CONTRAST:  75mL OMNIPAQUE IOHEXOL 300 MG/ML  SOLN

[Series 2: soft tissue neck 2.0 b31s · axial · 0.52mm/px · z∈[+103,+285]mm · 5 of 137 slices shown, 7 images]
[im 23/137  soft-tissue]
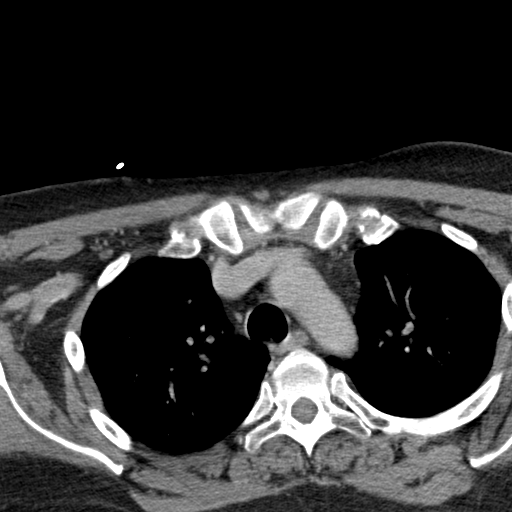
[im 23/137  bone]
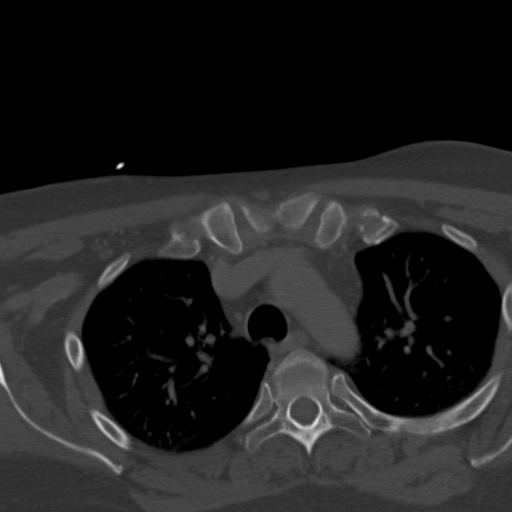
[im 46/137  bone]
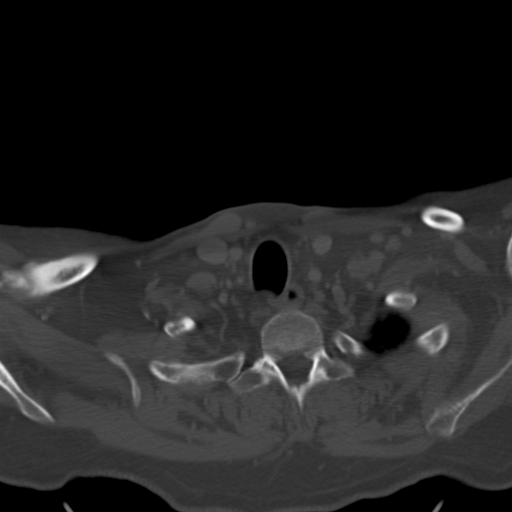
[im 69/137  bone]
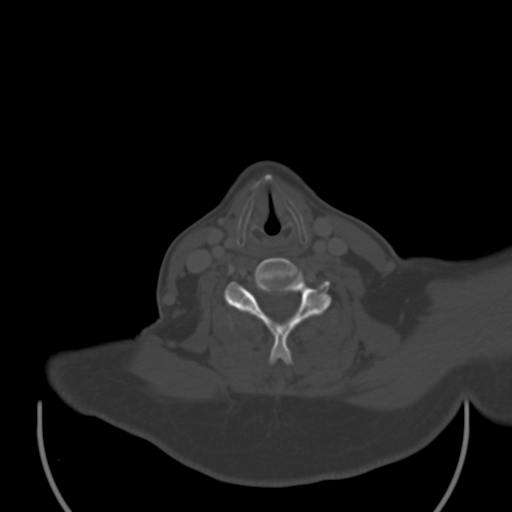
[im 91/137  bone]
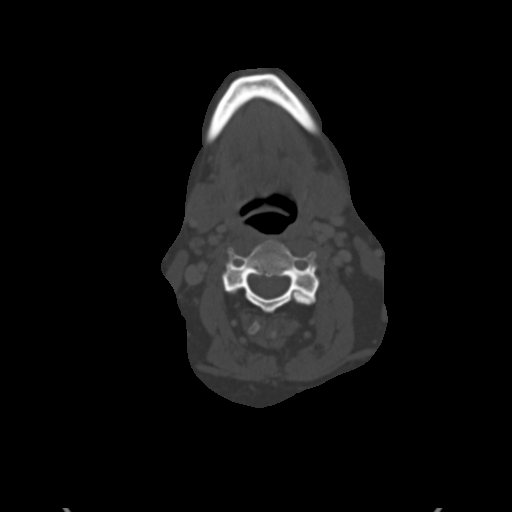
[im 114/137  soft-tissue]
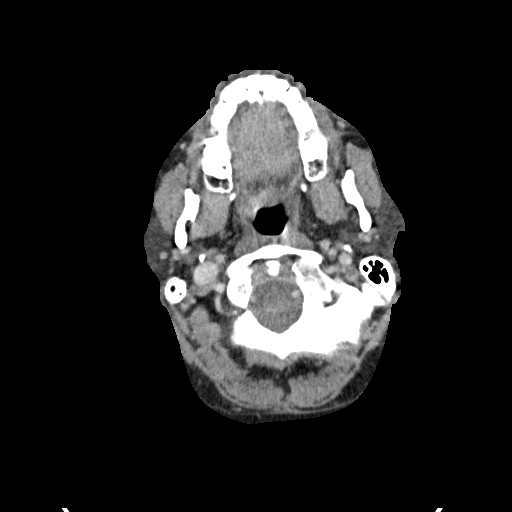
[im 114/137  bone]
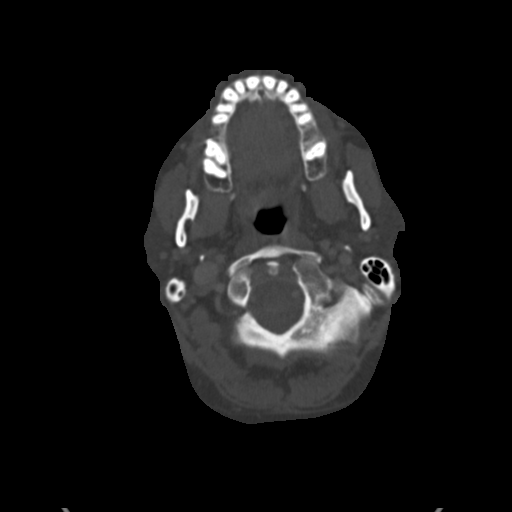

[Series 4: neck 2.0 soft tissue sag · sagittal · 0.54mm/px · 5 of 76 slices shown, 6 images]
[im 26/76  bone]
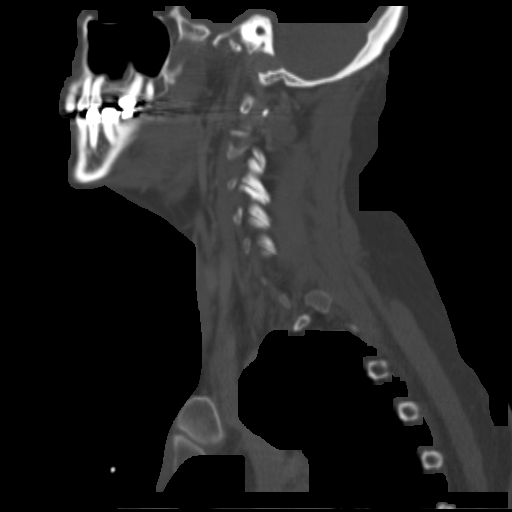
[im 32/76  bone]
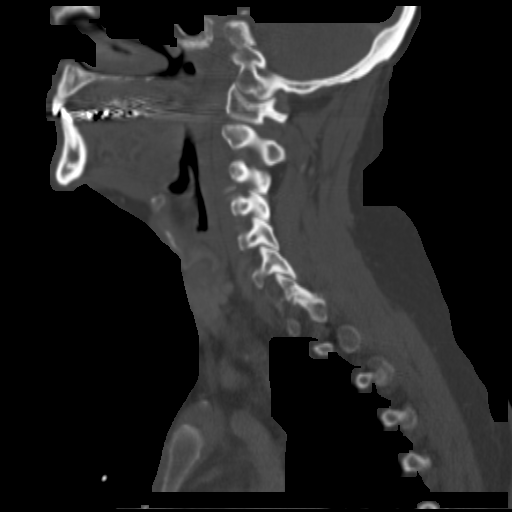
[im 38/76  soft-tissue]
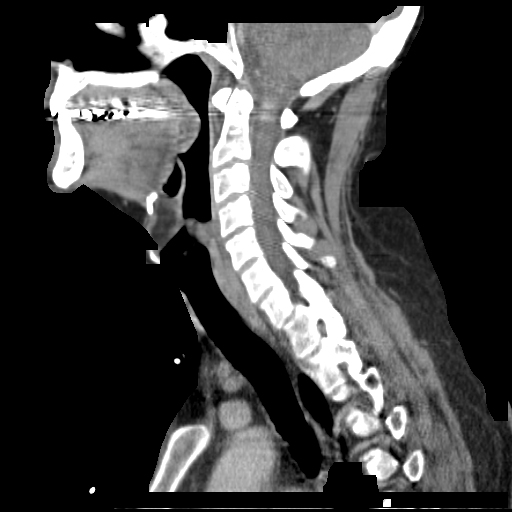
[im 38/76  bone]
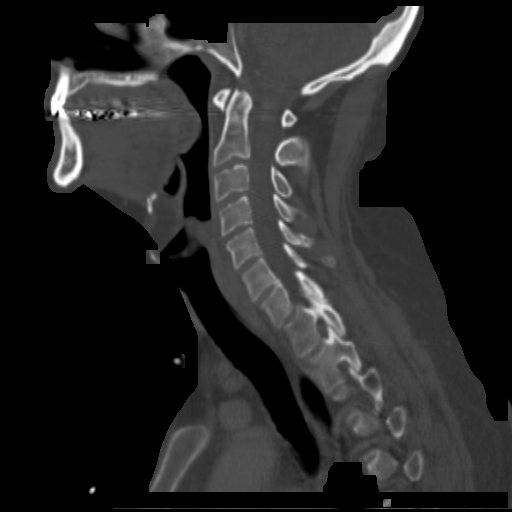
[im 44/76  bone]
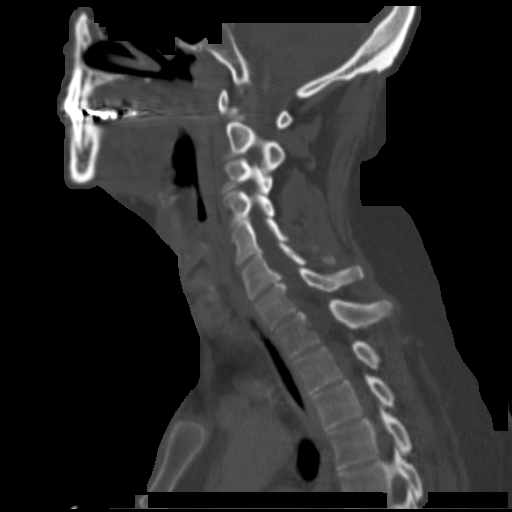
[im 51/76  bone]
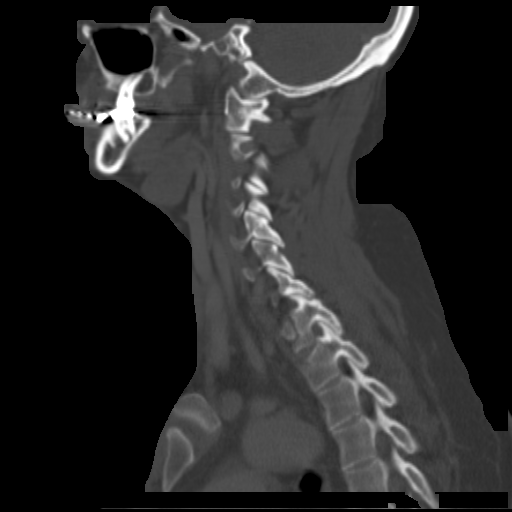

[Series 5: neck 2.0 soft tissue coro · coronal · 0.47mm/px · 3 of 133 slices shown]
[im 27/133  bone]
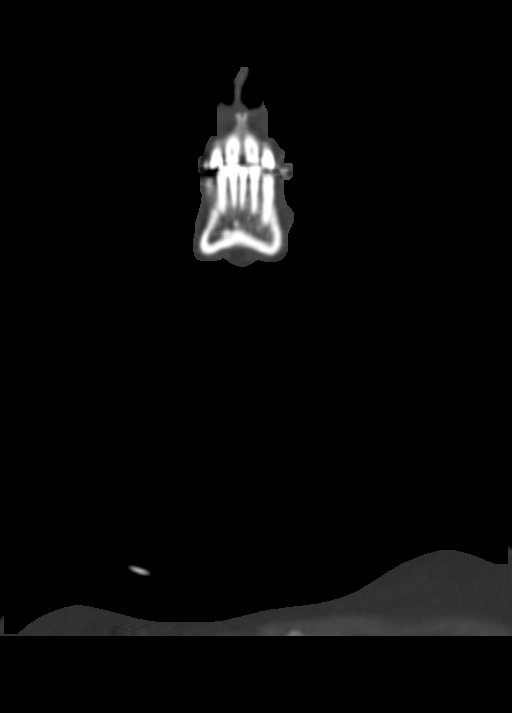
[im 53/133  bone]
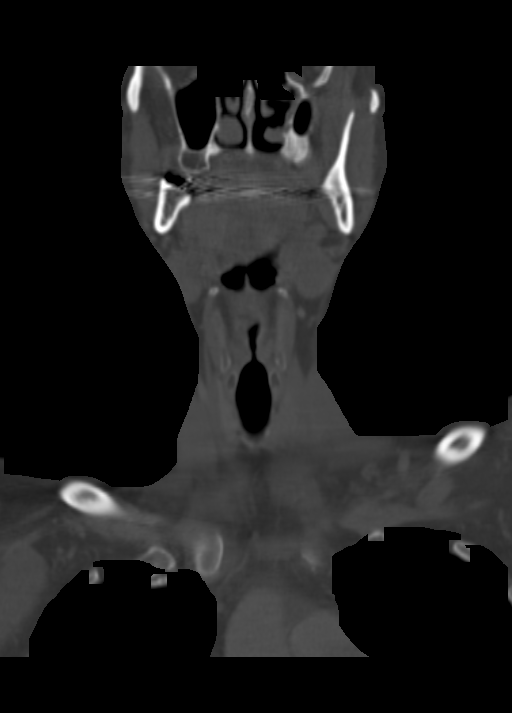
[im 80/133  bone]
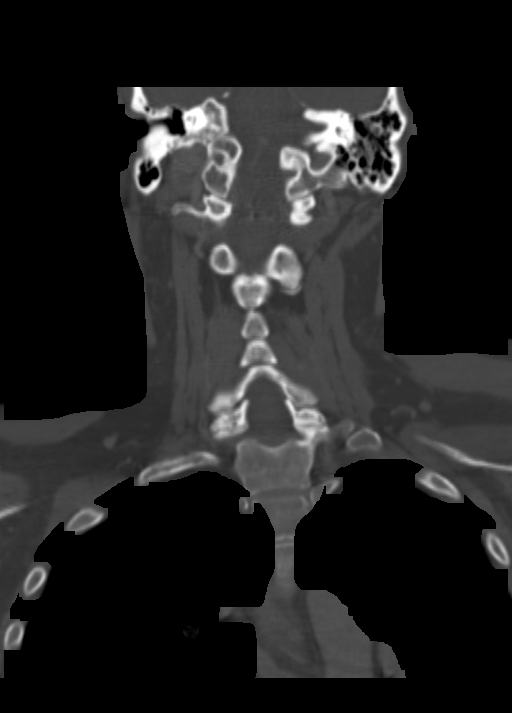

[Series 6: axial soft tissue neck 2.0 · axial · 0.45mm/px · z∈[+104,+150]mm · 2 of 136 slices shown]
[im 23/136  soft-tissue]
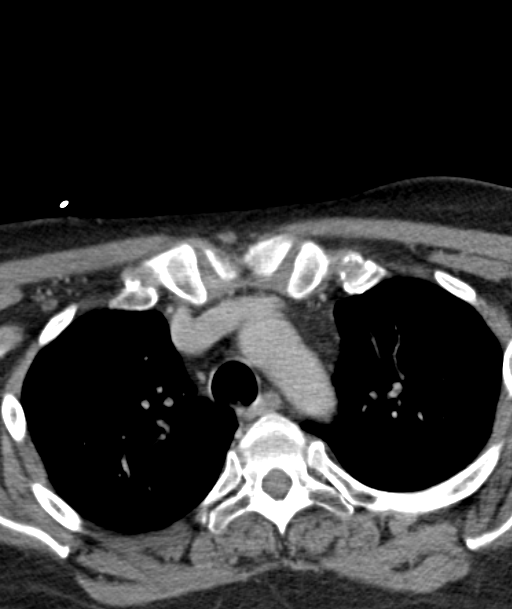
[im 46/136  soft-tissue]
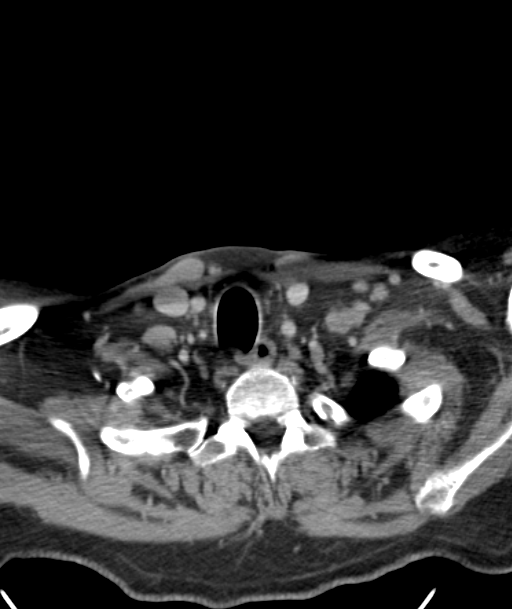

[15 of 33 positions shown; findings below may reference images not displayed]

FINDINGS: Pharynx and larynx: No mucosal or submucosal abnormality. Glottic
region appears normal.

Salivary glands: Submandibular and parotid glands are normal.

Thyroid: Normal

Lymph nodes: No enlarged or low-density nodes.

Vascular: Normal

Limited intracranial: Normal

Visualized orbits: Normal

Mastoids and visualized paranasal sinuses: Mild mucosal thickening.
No significant sinus disease.

Skeleton: Normal

Upper chest: Lung apices and superior mediastinal region are normal.
No evidence of tracheal stenosis or airway compromise.
IMPRESSION: Negative study. No abnormality seen to explain the presenting
symptoms. No evidence of airway compromise or other acute process in
the neck.

## 2018-04-17 ENCOUNTER — Emergency Department (HOSPITAL_COMMUNITY)
Admission: EM | Admit: 2018-04-17 | Discharge: 2018-04-17 | Disposition: A | Discharge status: Home / Self Care | Payer: BC Managed Care – PPO | Attending: Emergency Medicine | Admitting: Emergency Medicine

## 2018-04-17 ENCOUNTER — Other Ambulatory Visit: Payer: Self-pay

## 2018-04-17 ENCOUNTER — Encounter (HOSPITAL_COMMUNITY): Payer: Self-pay | Admitting: Emergency Medicine

## 2018-04-17 DIAGNOSIS — Y999 Unspecified external cause status: Secondary | ICD-10-CM | POA: Diagnosis not present

## 2018-04-17 DIAGNOSIS — Z79899 Other long term (current) drug therapy: Secondary | ICD-10-CM | POA: Diagnosis not present

## 2018-04-17 DIAGNOSIS — Y939 Activity, unspecified: Secondary | ICD-10-CM | POA: Insufficient documentation

## 2018-04-17 DIAGNOSIS — W268XXA Contact with other sharp object(s), not elsewhere classified, initial encounter: Secondary | ICD-10-CM | POA: Diagnosis not present

## 2018-04-17 DIAGNOSIS — S8991XA Unspecified injury of right lower leg, initial encounter: Secondary | ICD-10-CM | POA: Diagnosis present

## 2018-04-17 DIAGNOSIS — Y929 Unspecified place or not applicable: Secondary | ICD-10-CM | POA: Diagnosis not present

## 2018-04-17 DIAGNOSIS — S81811A Laceration without foreign body, right lower leg, initial encounter: Secondary | ICD-10-CM | POA: Diagnosis not present

## 2018-04-17 MED ORDER — LIDOCAINE HCL (PF) 2 % IJ SOLN
INTRAMUSCULAR | Status: AC
Start: 2018-04-17 — End: 2018-04-17
  Administered 2018-04-17: 10 mL via INTRADERMAL
  Filled 2018-04-17: qty 10

## 2018-04-17 MED ORDER — LIDOCAINE HCL (PF) 2 % IJ SOLN
10.0000 mL | Freq: Once | INTRAMUSCULAR | Status: AC
  Administered 2018-04-17: 10 mL via INTRADERMAL

## 2018-04-17 MED ORDER — POVIDONE-IODINE 10 % EX SOLN: CUTANEOUS | Status: DC | PRN

## 2018-04-17 MED ORDER — POVIDONE-IODINE 10 % EX SOLN
CUTANEOUS | Status: AC
  Filled 2018-04-17: qty 15

## 2018-04-17 MED ORDER — LIDOCAINE HCL (PF) 2 % IJ SOLN
INTRAMUSCULAR | Status: AC
  Filled 2018-04-17: qty 10

## 2018-04-17 MED ORDER — LIDOCAINE HCL (PF) 2 % IJ SOLN: 5.0000 mL | Freq: Once | INTRAMUSCULAR | Status: DC

## 2018-04-17 NOTE — ED Triage Notes (Signed)
Pt reports cutting his R leg on a bush hog that was not running. No active bleeding at this time. Denies blood thinner use. Last tetanus 1 year ago.

## 2018-04-17 NOTE — ED Provider Notes (Signed)
[facility] EMERGENCY DEPARTMENT Provider Note   CSN: [id number] Arrival date & time: 04/17/18  1551     History   Chief Complaint Chief Complaint  Patient presents with  . Extremity Laceration    HPI Jacob Hunter is a 62 y.o. male presenting with a right lower leg laceration occurring 30 minutes prior to arrival when he hit his leg on a corner of his bush hog (metal frame, not the blade).  He denies any other injury. He did not clean the wound prior to arrival but was able to obtain hemostasis after applying direct pressure.  His tetanus is current.  He endorses fragile skin due to chronic steroid use.  The history is provided by the patient.    Past Medical History:  Diagnosis Date  . Arthritis   . Hypercholesteremia   . Knee pain   . Necrotizing myopathy   . Polymyositis [facility])     Patient Active Problem List   Diagnosis Date Noted  . Bilateral leg weakness 01/24/2015  . Elevated CPK 01/24/2015  . Transaminitis 10/24/2014    Past Surgical History:  Procedure Laterality Date  . ANKLE SURGERY Left   . KNEE SURGERY Left         Home Medications    Prior to Admission medications   Medication Sig Start Date End Date Taking? Authorizing Provider  ascorbic acid (VITAMIN C) 500 MG tablet Take 500 mg by mouth daily.    [provider]  Calcium Carbonate-Vitamin D (CALCIUM-VITAMIN D) 500-200 MG-UNIT tablet Take 1 tablet by mouth daily. 03/22/15 05/01/16  [provider]  Coenzyme Q10 (CO Q 10) 10 MG CAPS Take 10 mg by mouth daily.    [provider]  FLUoxetine (PROZAC) 20 MG capsule Take 20 mg by mouth daily.    [provider]  folic acid (FOLVITE) 1 MG tablet Take 1 mg by mouth daily.    [provider]  methotrexate (RHEUMATREX) 2.5 MG tablet Take 25 mg by mouth once a week. Caution:Chemotherapy. Protect from light.(monday) 10 tabs total    [provider]  pantoprazole (PROTONIX) 40 MG tablet Take 40 mg by  mouth daily.    [provider]  predniSONE (DELTASONE) 20 MG tablet Take 80 mg by mouth daily with breakfast. Decreased to 40 mg    [provider]  sulfamethoxazole-trimethoprim (BACTRIM,SEPTRA) 200-40 MG/5ML suspension Take by mouth daily.    [provider]    Family History Family History  Problem Relation Age of Onset  . Hypertension Father   . Hypercholesterolemia Father   . Hypercholesterolemia Sister   . Hypertension Mother   . Breast cancer Sister   . Colon cancer Neg Hx   . Liver disease Neg Hx     Social History Social History   Tobacco Use  . Smoking status: Never Smoker  . Smokeless tobacco: Never Used  Substance Use Topics  . Alcohol use: No    Alcohol/week: 0.0 standard drinks    Comment: Last ETOH was 1-2 drinks in 1990.  . Drug use: No     Allergies   Methylprednisolone; Other; and Statins   Review of Systems Review of Systems  Constitutional: Negative for chills and fever.  Skin: Positive for wound.  Neurological: Negative for numbness.     Physical Exam Updated Vital Signs BP (!) 158/90 (BP Location: Right Arm)   Pulse 95   Temp (!) 97.5 F (36.4 C) (Oral)   Resp 16   Ht 6'  1" (1.854 m)   Wt 103.4 kg   SpO2 99%   BMI 30.08 kg/m   Physical Exam  Constitutional: He is oriented to person, place, and time. He appears well-developed and well-nourished.  HENT:  Head: Normocephalic.  Cardiovascular: Normal rate.  Pulmonary/Chest: Effort normal.  Musculoskeletal: He exhibits no tenderness.  Neurological: He is alert and oriented to person, place, and time. No sensory deficit.  Skin: Laceration noted.  3 cm curvilinear laceration right mid anterior lower leg.  Hemostatic.  Skin appears edematous and easily friable. Wound is superficial but subcutaneous.     ED Treatments / Results  Labs (all labs ordered are listed, but only abnormal results are displayed) Labs Reviewed - No data to  display  EKG None  Radiology No results found.  Procedures Procedures (including critical care time)  LACERATION REPAIR Performed by: Evalee Jefferson Authorized by: Evalee Jefferson Consent: Verbal consent obtained. Risks and benefits: risks, benefits and alternatives were discussed Consent given by: patient Patient identity confirmed: provided demographic data Prepped and Draped in normal sterile fashion Wound explored  Laceration Location: right lower leg  Laceration Length: 3cm  No Foreign Bodies seen or palpated  Anesthesia: local infiltration  Local anesthetic: lidocaine 2% without epinephrine  Anesthetic total: 5 ml  Irrigation method: syringe Amount of cleaning: standard  Skin closure: ethilon 4-0 with sterile strips between six of the sutures for added support and approximation.  Number of sutures: 10  Technique: simple interupted  Patient tolerance: Patient tolerated the procedure well with no immediate complications.   Medications Ordered in ED Medications  lidocaine (XYLOCAINE) 2 % injection 10 mL (has no administration in time range)  povidone-iodine (BETADINE) 10 % external solution (has no administration in time range)  lidocaine (XYLOCAINE) 2 % injection (has no administration in time range)     Initial Impression / Assessment and Plan / ED Course  I have reviewed the triage vital signs and the nursing notes.  Pertinent labs & imaging results that were available during my care of the patient were reviewed by me and considered in my medical decision making (see chart for details).     Wound care instructions given.  Pt advised to have sutures removed in 10 days,  Return here sooner for any signs of infection including redness, swelling, worse pain or drainage of pus.     Final Clinical Impressions(s) / ED Diagnoses   Final diagnoses:  Laceration of right lower extremity, initial encounter    ED Discharge Orders    None       Landis Martins 04/17/18 1707    Noemi Chapel, MD 04/17/18 2325

## 2018-04-17 NOTE — ED Notes (Signed)
Hooking up bush hog to Jacob Hunter when it cut R lower leg  3 in lac with bleeding controlled  Sensation to extrmity

## 2018-04-17 NOTE — Discharge Instructions (Addendum)
Keep your wound clean and dry as discussed.  Allow the strips to fall away on their own and plan to have the sutures removed in 10 days. Get seen sooner for any signs of infection (redness, drainage of pus or swelling). Elevation and ice will help with the swelling around your wound this evening.

## 2018-07-21 DIAGNOSIS — M25562 Pain in left knee: Secondary | ICD-10-CM | POA: Insufficient documentation

## 2018-08-25 DIAGNOSIS — Z79899 Other long term (current) drug therapy: Secondary | ICD-10-CM | POA: Diagnosis not present

## 2018-08-25 DIAGNOSIS — G7289 Other specified myopathies: Secondary | ICD-10-CM | POA: Diagnosis not present

## 2018-09-08 DIAGNOSIS — C44229 Squamous cell carcinoma of skin of left ear and external auricular canal: Secondary | ICD-10-CM | POA: Diagnosis not present

## 2018-09-08 DIAGNOSIS — C44222 Squamous cell carcinoma of skin of right ear and external auricular canal: Secondary | ICD-10-CM | POA: Diagnosis not present

## 2018-09-10 DIAGNOSIS — Z125 Encounter for screening for malignant neoplasm of prostate: Secondary | ICD-10-CM | POA: Diagnosis not present

## 2018-09-10 DIAGNOSIS — G7249 Other inflammatory and immune myopathies, not elsewhere classified: Secondary | ICD-10-CM | POA: Diagnosis not present

## 2018-09-10 DIAGNOSIS — M1990 Unspecified osteoarthritis, unspecified site: Secondary | ICD-10-CM | POA: Diagnosis not present

## 2018-09-10 DIAGNOSIS — Z79899 Other long term (current) drug therapy: Secondary | ICD-10-CM | POA: Diagnosis not present

## 2018-09-17 DIAGNOSIS — Z6832 Body mass index (BMI) 32.0-32.9, adult: Secondary | ICD-10-CM | POA: Diagnosis not present

## 2018-09-17 DIAGNOSIS — M1991 Primary osteoarthritis, unspecified site: Secondary | ICD-10-CM | POA: Diagnosis not present

## 2018-09-17 DIAGNOSIS — G7289 Other specified myopathies: Secondary | ICD-10-CM | POA: Diagnosis not present

## 2018-09-17 DIAGNOSIS — Z0001 Encounter for general adult medical examination with abnormal findings: Secondary | ICD-10-CM | POA: Diagnosis not present

## 2018-09-17 DIAGNOSIS — E785 Hyperlipidemia, unspecified: Secondary | ICD-10-CM | POA: Diagnosis not present

## 2018-09-17 DIAGNOSIS — K760 Fatty (change of) liver, not elsewhere classified: Secondary | ICD-10-CM | POA: Diagnosis not present

## 2018-09-17 DIAGNOSIS — Z23 Encounter for immunization: Secondary | ICD-10-CM | POA: Diagnosis not present

## 2018-09-22 DIAGNOSIS — G7289 Other specified myopathies: Secondary | ICD-10-CM | POA: Diagnosis not present

## 2018-09-22 DIAGNOSIS — Z79899 Other long term (current) drug therapy: Secondary | ICD-10-CM | POA: Diagnosis not present

## 2018-09-24 DIAGNOSIS — X58XXXA Exposure to other specified factors, initial encounter: Secondary | ICD-10-CM | POA: Diagnosis not present

## 2018-09-24 DIAGNOSIS — M94262 Chondromalacia, left knee: Secondary | ICD-10-CM | POA: Diagnosis not present

## 2018-09-24 DIAGNOSIS — G8918 Other acute postprocedural pain: Secondary | ICD-10-CM | POA: Diagnosis not present

## 2018-09-24 DIAGNOSIS — S83232A Complex tear of medial meniscus, current injury, left knee, initial encounter: Secondary | ICD-10-CM | POA: Diagnosis not present

## 2018-09-24 DIAGNOSIS — Y999 Unspecified external cause status: Secondary | ICD-10-CM | POA: Diagnosis not present

## 2018-10-02 ENCOUNTER — Ambulatory Visit (HOSPITAL_COMMUNITY): Payer: PPO | Attending: Specialist

## 2018-10-02 ENCOUNTER — Encounter (HOSPITAL_COMMUNITY): Payer: Self-pay

## 2018-10-02 ENCOUNTER — Other Ambulatory Visit: Payer: Self-pay

## 2018-10-02 DIAGNOSIS — R600 Localized edema: Secondary | ICD-10-CM | POA: Insufficient documentation

## 2018-10-02 DIAGNOSIS — M6281 Muscle weakness (generalized): Secondary | ICD-10-CM | POA: Insufficient documentation

## 2018-10-02 DIAGNOSIS — M25562 Pain in left knee: Secondary | ICD-10-CM | POA: Diagnosis not present

## 2018-10-02 DIAGNOSIS — G8929 Other chronic pain: Secondary | ICD-10-CM | POA: Insufficient documentation

## 2018-10-02 DIAGNOSIS — R29898 Other symptoms and signs involving the musculoskeletal system: Secondary | ICD-10-CM | POA: Diagnosis not present

## 2018-10-02 NOTE — Therapy (Addendum)
[place], [place], [address] Phone: [phone]   Fax:  [phone]  Physical Therapy Evaluation  Patient Details  Name: Jacob Hunter MRN: [id number] Date of Birth: Jul 10, 1958 Referring Provider (PT): Sydnee Cabal, MD   Encounter Date: 10/02/2018  PT End of Session - 10/02/18 1231    Visit Number  1    Number of Visits  8    Date for PT Re-Evaluation  10/30/18    Authorization Type  [employer]    Authorization Time Period  10/02/18 to 10/30/18    PT Start Time  1025    PT Stop Time  1100    PT Time Calculation (min)  35 min    Activity Tolerance  Patient tolerated treatment well    Behavior During Therapy  [facility] for tasks assessed/performed       Past Medical History:  Diagnosis Date  . Arthritis   . Hypercholesteremia   . Knee pain   . Necrotizing myopathy   . Polymyositis [facility])     Past Surgical History:  Procedure Laterality Date  . ANKLE SURGERY Left   . KNEE SURGERY Left     There were no vitals filed for this visit.   Subjective Assessment - 10/02/18 1027    Subjective  Pt reports undergoing L knee scope by Dr. Sydnee Cabal on 09/24/18 to repair torn meniscus, arthritis, and remove some cartilage. He reports that he was walking across his yard in August, stepped in a hole, and heard a pop. This is what caused his pain which gradually worsened so he had the surgery. He states that his pain increases if he walks a lot on it. He reports having the most difficulty with bending down/squatting and going up stairs. He has necrotizing myopathy so he can only push himself 50% before feeling exhausted. He restarts shots/infusions for this next month; he takes them every 6 months and after them he is very tired and weak; this is a 6-hour infusion for 4 days (1x/week).    Pertinent History  necrotizing myopathy    Limitations  Standing;Walking    How long can you sit comfortably?  1 hour    How long can you stand comfortably?  30-40 mins    How long can you walk comfortably?  unsure    Patient Stated Goals  function normally like my R knee    Currently in Pain?  Yes    Pain Score  4     Pain Location  Knee    Pain Orientation  Left    Pain Descriptors / Indicators  Aching;Dull    Pain Type  Surgical pain    Pain Onset  1 to 4 weeks ago    Pain Frequency  Intermittent    Aggravating Factors   stairs, squatting    Pain Relieving Factors  ice, rest    Effect of Pain on Daily Activities  increase         [facility] PT Assessment - 10/02/18 0001      Assessment   Medical Diagnosis  L knee scope    Referring Provider (PT)  Sydnee Cabal, MD    Onset Date/Surgical Date  09/24/18    Next MD Visit  10/02/18    Prior Therapy  yes for necrotizing myopathy in 2017      Precautions   Precautions  Knee    Precaution Comments  WBAT LLE      Balance Screen  Has the patient fallen in the past 6 months  No    Has the patient had a decrease in activity level because of a fear of falling?   No    Is the patient reluctant to leave their home because of a fear of falling?   No      Prior Function   Level of Independence  Independent    Vocation  On disability    Leisure  would like to play golf; hunting, fishing      Observation/Other Assessments   Focus on Therapeutic Outcomes (FOTO)   40% limitation      Observation/Other Assessments-Edema    Edema  Circumferential      Circumferential Edema   Circumferential - Right  37cm, joint line    Circumferential - Left   40.5cm, joint line      Functional Tests   Functional tests  Step down      Step Down   Comments  x3 reps on 7" step: increased L knee pain with step down, mild L knee valgus compared to R      ROM / Strength   AROM / PROM / Strength  AROM;Strength      AROM   AROM Assessment Site  Knee    Right/Left Knee  Right;Left    Right Knee Extension  0    Right Knee Flexion  132    Left Knee Extension  0    Left  Knee Flexion  117      Strength   Strength Assessment Site  Hip;Knee;Ankle    Right Hip Flexion  5/5    Right Hip Extension  4/5    Right Hip ABduction  4+/5    Left Hip Flexion  5/5    Left Hip Extension  4-/5    Left Hip ABduction  4/5    Right Knee Flexion  4+/5    Right Knee Extension  5/5    Left Knee Flexion  4+/5    Left Knee Extension  5/5    Right Ankle Dorsiflexion  5/5    Left Ankle Dorsiflexion  5/5      Palpation   Patella mobility  WNL    Palpation comment  mild restrictions in distal quad, non-tender to palpation;       Ambulation/Gait   Ambulation Distance (Feet)  678 Feet   3MWT   Assistive device  None    Gait Pattern  Step-through pattern;Trendelenburg;Lateral trunk lean to left    Gait Comments  increased L knee pain to 5/10      Balance   Balance Assessed  Yes      Static Standing Balance   Static Standing - Balance Support  No upper extremity supported    Static Standing Balance -  Activities   Single Leg Stance - Right Leg;Single Leg Stance - Left Leg    Static Standing - Comment/# of Minutes  R: 6.74sec or < L: 5sec or < (increased L trunk lean due to hip weak)      Standardized Balance Assessment   Standardized Balance Assessment  Five Times Sit to Stand    Five times sit to stand comments   10.41sec, no UE, LLE slightly extended            Objective measurements completed on examination: See above findings.         PT Education - 10/02/18 1231    Education Details  exam findings, HEP, POC  Person(s) Educated  Patient    Methods  Explanation;Demonstration;Handout    Comprehension  Verbalized understanding       PT Short Term Goals - 10/02/18 1241      PT SHORT TERM GOAL #1   Title  Pt will have reduced edema by 2cm or > at joint line in order to reduce pain and improve ROM.     Time  2    Period  Weeks    Status  New    Target Date  10/16/18      PT SHORT TERM GOAL #2   Title  Pt will have improved L knee flexion to  130deg or > to demo reduced edema and maximize his stair ambulation.     Time  2    Period  Weeks    Status  New      PT SHORT TERM GOAL #3   Title  Pt will be able to perform bil SLS for 10sec or > with min to no trunk lean over LLE to demo improved functional hip strength in order to maximize stair ambulation and gait on uneven ground.    Time  2    Period  Weeks    Status  New        PT Long Term Goals - 10/02/18 1242      PT LONG TERM GOAL #1   Title  Pt will have improved MMT to 5/5 to demo improved overall strength and maximize return to PLOF activities with greater ease.     Time  4    Period  Weeks    Status  New    Target Date  10/30/18      PT LONG TERM GOAL #2   Title  Pt will be able to perform 3MWT without increases in L knee pain and gait mechanics WFL to demo improved overall functional strength and maximize return to PLOF.     Time  4    Period  Weeks    Status  New      PT LONG TERM GOAL #3   Title  Pt will be able to demo 5 L single leg step downs with proper form and no L knee pain to demo improved functional hip strength and maximize stair ambulation with less pain.    Time  4    Period  Weeks    Status  New      PT LONG TERM GOAL #4   Title  Pt will report being able to squat down to lift items up off the floor with 2/10 L knee pain or < and with proper form to demo improved functional strength and maximize his ability to complete HH tasks with greater ease.     Time  4    Period  Weeks    Status  New             Plan - 10/02/18 1232    Clinical Impression Statement  Pt is pleasant 63YO M who presents to OPPT 8 days post-op s/p L knee scope for meniscus repair on 09/24/18 by Dr. Sydnee Cabal. Pt presents with post-op deficits in pain (mild), edema, ROM, strength, balance, gait, functional strength and functional mobility. His AROM was 0-117deg on L compared to 0-132deg on the R. Pt has h/o L knee scope by Dr. Theda Sers in 2015 and he also has  necrotizing myopathy and easily fatigues; pt reporting he can only work to about 50% of full capability  or he will become exhausted so future sessions should take precaution with this. Pt needs skilled PT intervention to address these impairments in order to reduce swelling, reduce pain, and improve ROM in order to maximize return to PLOF.     History and Personal Factors relevant to plan of care:  necrotizing myopathy (monitor fatigue)    Clinical Presentation  Stable    Clinical Presentation due to:  see flowsheets for objective tests and measures    Clinical Decision Making  Low    Rehab Potential  Good    PT Frequency  2x / week    PT Duration  4 weeks    PT Treatment/Interventions  ADLs/Self Care Home Management;Aquatic Therapy;Cryotherapy;Electrical Stimulation;Moist Heat;Ultrasound;DME Instruction;Gait training;Stair training;Functional mobility training;Therapeutic activities;Therapeutic exercise;Balance training;Neuromuscular re-education;Patient/family education;Orthotic Fit/Training;Manual techniques;Scar mobilization;Passive range of motion;Dry needling;Energy conservation;Taping    PT Next Visit Plan  review goals; monitor fatigue levels due to necrotizing myopathy; begin ROM and strenghtening work, address edema    PT Home Exercise Plan  eval: continue current HEP and added prone quad stretch, bridging, SLR    Consulted and Agree with Plan of Care  Patient       Patient will benefit from skilled therapeutic intervention in order to improve the following deficits and impairments:  Decreased activity tolerance, Decreased balance, Decreased range of motion, Decreased strength, Difficulty walking, Hypomobility, Increased edema, Increased fascial restricitons, Increased muscle spasms, Impaired flexibility, Pain, Decreased endurance, Improper body mechanics  Visit Diagnosis: Chronic pain of left knee - Plan: PT plan of care cert/re-cert  Muscle weakness (generalized) - Plan: PT plan of  care cert/re-cert  Localized edema - Plan: PT plan of care cert/re-cert  Other symptoms and signs involving the musculoskeletal system - Plan: PT plan of care cert/re-cert     Problem List Patient Active Problem List   Diagnosis Date Noted  . Bilateral leg weakness 01/24/2015  . Elevated CPK 01/24/2015  . Transaminitis 10/24/2014        Geraldine Solar PT, DPT  [facility] [address] [place], [place], [address] Phone: [phone]   Fax:  [phone]  Name: ADREAN HEITZ MRN: [id number] Date of Birth: March 04, 1956

## 2018-10-02 NOTE — Patient Instructions (Signed)
Access Code: [email or web address]  URL: [email or web address]  Date: 10/02/2018  Prepared by: Geraldine Solar   Exercises . Prone Quadriceps Stretch with Strap - 5-10 reps - 30-60seconds hold - 1-2x daily - 7x weekly . Supine Bridge - 10 reps - 3 sets - 1x daily - 7x weekly . Supine Active Straight Leg Raise - 10 reps - 3 sets - 1x daily - 7x weekly

## 2018-10-05 ENCOUNTER — Ambulatory Visit (HOSPITAL_COMMUNITY): Payer: PPO

## 2018-10-05 ENCOUNTER — Encounter (HOSPITAL_COMMUNITY): Payer: Self-pay

## 2018-10-05 DIAGNOSIS — M25562 Pain in left knee: Secondary | ICD-10-CM | POA: Diagnosis not present

## 2018-10-05 DIAGNOSIS — R29898 Other symptoms and signs involving the musculoskeletal system: Secondary | ICD-10-CM

## 2018-10-05 DIAGNOSIS — M6281 Muscle weakness (generalized): Secondary | ICD-10-CM

## 2018-10-05 DIAGNOSIS — R600 Localized edema: Secondary | ICD-10-CM

## 2018-10-05 DIAGNOSIS — G8929 Other chronic pain: Secondary | ICD-10-CM

## 2018-10-05 NOTE — Therapy (Signed)
[place], [place], [address] Phone: [phone]   Fax:  [phone]  Physical Therapy Treatment  Patient Details  Name: Jacob Hunter MRN: [id number] Date of Birth: [birth year] Referring Provider (PT): Sydnee Cabal, MD   Encounter Date: 10/05/2018  PT End of Session - 10/05/18 0945    Visit Number  2    Number of Visits  8    Date for PT Re-Evaluation  10/30/18    Authorization Type  [employer]    Authorization Time Period  10/02/18 to 10/30/18    PT Start Time  0945    PT Stop Time  1025    PT Time Calculation (min)  40 min    Activity Tolerance  Patient tolerated treatment well    Behavior During Therapy  [facility] for tasks assessed/performed       Past Medical History:  Diagnosis Date  . Arthritis   . Hypercholesteremia   . Knee pain   . Necrotizing myopathy   . Polymyositis [facility])     Past Surgical History:  Procedure Laterality Date  . ANKLE SURGERY Left   . KNEE SURGERY Left     There were no vitals filed for this visit.  Subjective Assessment - 10/05/18 0946    Subjective  Pt states that he was stiff over the weekend until he did he HEP and then it loosened it up.     Pertinent History  necrotizing myopathy    Limitations  Standing;Walking    How long can you sit comfortably?  1 hour    How long can you stand comfortably?  30-40 mins    How long can you walk comfortably?  unsure    Patient Stated Goals  function normally like my R knee    Currently in Pain?  Yes    Pain Score  3     Pain Location  Knee    Pain Orientation  Left    Pain Descriptors / Indicators  Aching    Pain Type  Surgical pain    Pain Onset  1 to 4 weeks ago    Pain Frequency  Intermittent    Aggravating Factors   stairs, squatting    Pain Relieving Factors  ice, rest    Effect of Pain on Daily Activities  increase            [facility] Adult PT Treatment/Exercise - 10/05/18 0001      Exercises   Exercises  Knee/Hip      Knee/Hip Exercises: Stretches   Passive Hamstring Stretch  Left;3 reps;30 seconds    Passive Hamstring Stretch Limitations  standing, 12" stretch    Knee: Self-Stretch to increase Flexion  Left    Knee: Self-Stretch Limitations  10x10" holds, 12" step    Gastroc Stretch  Both;3 reps;30 seconds    Gastroc Stretch Limitations  slant board      Knee/Hip Exercises: Machines for Strengthening   Total Gym Leg Press  multigym 60#, BLE, x15      Knee/Hip Exercises: Standing   Heel Raises  Both;20 reps    Heel Raises Limitations  heel and toe    Knee Flexion  Left;15 reps    Lateral Step Up  Left;15 reps;Step Height: 4"    Lateral Step Up Limitations  min cues for TKE at top    Forward Step Up  Left;15 reps;Step Height: 4"    Step Down  Left;15 reps;Step Height: 4"  Rocker Board  2 minutes    Rocker Board Limitations  R/L      Knee/Hip Exercises: Supine   Knee Extension Limitations  0    Knee Flexion Limitations  120      Manual Therapy   Manual Therapy  Edema management;Soft tissue mobilization    Manual therapy comments  completed separate rest of treatment    Edema Management  retro massage with BLE elevated    Soft tissue mobilization  STM to distal quad to reduce restrictions             PT Education - 10/05/18 0945    Education Details  review goals, exercise technique, contiue HEP    Person(s) Educated  Patient    Methods  Explanation;Demonstration    Comprehension  Verbalized understanding;Returned demonstration       PT Short Term Goals - 10/02/18 1241      PT SHORT TERM GOAL #1   Title  Pt will have reduced edema by 2cm or > at joint line in order to reduce pain and improve ROM.     Time  2    Period  Weeks    Status  New    Target Date  10/16/18      PT SHORT TERM GOAL #2   Title  Pt will have improved L knee flexion to 130deg or > to demo reduced edema and maximize his stair ambulation.     Time  2    Period  Weeks    Status   New      PT SHORT TERM GOAL #3   Title  Pt will be able to perform bil SLS for 10sec or > with min to no trunk lean over LLE to demo improved functional hip strength in order to maximize stair ambulation and gait on uneven ground.    Time  2    Period  Weeks    Status  New        PT Long Term Goals - 10/02/18 1242      PT LONG TERM GOAL #1   Title  Pt will have improved MMT to 5/5 to demo improved overall strength and maximize return to PLOF activities with greater ease.     Time  4    Period  Weeks    Status  New    Target Date  10/30/18      PT LONG TERM GOAL #2   Title  Pt will be able to perform 3MWT without increases in L knee pain and gait mechanics WFL to demo improved overall functional strength and maximize return to PLOF.     Time  4    Period  Weeks    Status  New      PT LONG TERM GOAL #3   Title  Pt will be able to demo 5 L single leg step downs with proper form and no L knee pain to demo improved functional hip strength and maximize stair ambulation with less pain.    Time  4    Period  Weeks    Status  New      PT LONG TERM GOAL #4   Title  Pt will report being able to squat down to lift items up off the floor with 2/10 L knee pain or < and with proper form to demo improved functional strength and maximize his ability to complete HH tasks with greater ease.     Time  4  Period  Weeks    Status  New            Plan - 10/05/18 1026    Clinical Impression Statement  Began session by reviewing goals with no f/u questions afterwards. Initiated stretching, ROM work, strengthening, and addressing edema this date. Min cues for form during therex but no reports of increased pain during session. Monitored fatigue during session and pt reported feeling well. Ended with manual work for edema control. AROM 0 to 120deg this date. continue as planned, progressing as able.     Rehab Potential  Good    PT Frequency  2x / week    PT Duration  4 weeks    PT  Treatment/Interventions  ADLs/Self Care Home Management;Aquatic Therapy;Cryotherapy;Electrical Stimulation;Moist Heat;Ultrasound;DME Instruction;Gait training;Stair training;Functional mobility training;Therapeutic activities;Therapeutic exercise;Balance training;Neuromuscular re-education;Patient/family education;Orthotic Fit/Training;Manual techniques;Scar mobilization;Passive range of motion;Dry needling;Energy conservation;Taping    PT Next Visit Plan   monitor fatigue levels due to necrotizing myopathy; continue ROM and strenghtening work, address edema    PT Home Exercise Plan  eval: continue current HEP and added prone quad stretch, bridging, SLR    Consulted and Agree with Plan of Care  Patient       Patient will benefit from skilled therapeutic intervention in order to improve the following deficits and impairments:  Decreased activity tolerance, Decreased balance, Decreased range of motion, Decreased strength, Difficulty walking, Hypomobility, Increased edema, Increased fascial restricitons, Increased muscle spasms, Impaired flexibility, Pain, Decreased endurance, Improper body mechanics  Visit Diagnosis: Chronic pain of left knee  Muscle weakness (generalized)  Localized edema  Other symptoms and signs involving the musculoskeletal system     Problem List Patient Active Problem List   Diagnosis Date Noted  . Bilateral leg weakness 01/24/2015  . Elevated CPK 01/24/2015  . Transaminitis 10/24/2014        Geraldine Solar PT, DPT  [facility] [address] [place], [place], [address] Phone: [phone]   Fax:  [phone]  Name: RUTLEDGE SELSOR MRN: [id number] Date of Birth: Jun 17, 1956

## 2018-10-07 ENCOUNTER — Ambulatory Visit (HOSPITAL_COMMUNITY): Payer: PPO

## 2018-10-07 ENCOUNTER — Encounter (HOSPITAL_COMMUNITY): Payer: Self-pay

## 2018-10-07 DIAGNOSIS — R29898 Other symptoms and signs involving the musculoskeletal system: Secondary | ICD-10-CM

## 2018-10-07 DIAGNOSIS — M25562 Pain in left knee: Principal | ICD-10-CM

## 2018-10-07 DIAGNOSIS — M6281 Muscle weakness (generalized): Secondary | ICD-10-CM

## 2018-10-07 DIAGNOSIS — G8929 Other chronic pain: Secondary | ICD-10-CM

## 2018-10-07 DIAGNOSIS — R600 Localized edema: Secondary | ICD-10-CM

## 2018-10-07 NOTE — Therapy (Signed)
[place], [place], [address] Phone: [phone]   Fax:  [phone]  Physical Therapy Treatment  Patient Details  Name: Jacob Hunter MRN: [id number] Date of Birth: 07/10/1956 Referring Provider (PT): Sydnee Cabal, MD   Encounter Date: 10/07/2018  PT End of Session - 10/07/18 1033    Visit Number  3    Number of Visits  8    Date for PT Re-Evaluation  10/30/18    Authorization Type  [employer]    Authorization Time Period  10/02/18 to 10/30/18    PT Start Time  1028    PT Stop Time  1112    PT Time Calculation (min)  44 min    Activity Tolerance  Patient tolerated treatment well   fatigue monitored through session, 4/10 tops following step down training   Behavior During Therapy  [facility] for tasks assessed/performed       Past Medical History:  Diagnosis Date  . Arthritis   . Hypercholesteremia   . Knee pain   . Necrotizing myopathy   . Polymyositis [facility])     Past Surgical History:  Procedure Laterality Date  . ANKLE SURGERY Left   . KNEE SURGERY Left     There were no vitals filed for this visit.  Subjective Assessment - 10/07/18 1026    Subjective  Pt stated knee stiffness/dull ache no real pain.  Reports compliance with HEP.  Main difficulty with descending stairs.    Pertinent History  necrotizing myopathy    Patient Stated Goals  function normally like my R knee    Currently in Pain?  No/denies                       [facility] Adult PT Treatment/Exercise - 10/07/18 0001      Knee/Hip Exercises: Stretches   Passive Hamstring Stretch  Left;3 reps;30 seconds    Passive Hamstring Stretch Limitations  supine with rope    Knee: Self-Stretch to increase Flexion  Left    Knee: Self-Stretch Limitations  10x10" holds, 12" step    Gastroc Stretch  Both;3 reps;30 seconds    Gastroc Stretch Limitations  slant board      Knee/Hip Exercises: Standing   Heel Raises  Both;20  reps    Heel Raises Limitations  heel and toe incline slope    Lateral Step Up  Left;15 reps;Hand Hold: 1;Step Height: 6"    Lateral Step Up Limitations  min cues for TKE at top    Forward Step Up  Left;15 reps;Step Height: 6"    Forward Step Up Limitations  intermittent HHA    Step Down  Left;15 reps;Step Height: 4"    Functional Squat  10 reps    Functional Squat Limitations  front of chair for mechanics    Rocker Board  2 minutes    Rocker Board Limitations  R/L; Df/PF      Knee/Hip Exercises: Seated   Sit to Sand  10 reps;without UE support      Knee/Hip Exercises: Supine   Knee Extension  AROM    Knee Extension Limitations  0    Knee Flexion  AROM    Knee Flexion Limitations  130      Manual Therapy   Manual Therapy  Edema management;Soft tissue mobilization    Manual therapy comments  completed separate rest of treatment    Edema Management  retro massage with BLE elevated  Soft tissue mobilization  STM to distal quad to reduce restrictions               PT Short Term Goals - 10/02/18 1241      PT SHORT TERM GOAL #1   Title  Pt will have reduced edema by 2cm or > at joint line in order to reduce pain and improve ROM.     Time  2    Period  Weeks    Status  New    Target Date  10/16/18      PT SHORT TERM GOAL #2   Title  Pt will have improved L knee flexion to 130deg or > to demo reduced edema and maximize his stair ambulation.     Time  2    Period  Weeks    Status  New      PT SHORT TERM GOAL #3   Title  Pt will be able to perform bil SLS for 10sec or > with min to no trunk lean over LLE to demo improved functional hip strength in order to maximize stair ambulation and gait on uneven ground.    Time  2    Period  Weeks    Status  New        PT Long Term Goals - 10/02/18 1242      PT LONG TERM GOAL #1   Title  Pt will have improved MMT to 5/5 to demo improved overall strength and maximize return to PLOF activities with greater ease.     Time  4     Period  Weeks    Status  New    Target Date  10/30/18      PT LONG TERM GOAL #2   Title  Pt will be able to perform 3MWT without increases in L knee pain and gait mechanics WFL to demo improved overall functional strength and maximize return to PLOF.     Time  4    Period  Weeks    Status  New      PT LONG TERM GOAL #3   Title  Pt will be able to demo 5 L single leg step downs with proper form and no L knee pain to demo improved functional hip strength and maximize stair ambulation with less pain.    Time  4    Period  Weeks    Status  New      PT LONG TERM GOAL #4   Title  Pt will report being able to squat down to lift items up off the floor with 2/10 L knee pain or < and with proper form to demo improved functional strength and maximize his ability to complete HH tasks with greater ease.     Time  4    Period  Weeks    Status  New            Plan - 10/07/18 1211    Clinical Impression Statement  Added gluteal strengthening activities including STS, squats and SLS.  Continued with stretches for ROM mobility and functional strengthening.  Able to increase height with step up training wiht cueing for control with activity, most difficulty with step downs due to eccentric quad weakness.  Monitored fatigue through sessoin with 4/10 top level with rest breaks PRN.  EOS wiht manual for edema control.  AROM 0-130 degrees.      Rehab Potential  Good    PT Frequency  2x / week  PT Duration  4 weeks    PT Treatment/Interventions  ADLs/Self Care Home Management;Aquatic Therapy;Cryotherapy;Electrical Stimulation;Moist Heat;Ultrasound;DME Instruction;Gait training;Stair training;Functional mobility training;Therapeutic activities;Therapeutic exercise;Balance training;Neuromuscular re-education;Patient/family education;Orthotic Fit/Training;Manual techniques;Scar mobilization;Passive range of motion;Dry needling;Energy conservation;Taping    PT Next Visit Plan   monitor fatigue levels  due to necrotizing myopathy; continue ROM and strenghtening work, address edema    PT Home Exercise Plan  eval: continue current HEP and added prone quad stretch, bridging, SLR       Patient will benefit from skilled therapeutic intervention in order to improve the following deficits and impairments:  Decreased activity tolerance, Decreased balance, Decreased range of motion, Decreased strength, Difficulty walking, Hypomobility, Increased edema, Increased fascial restricitons, Increased muscle spasms, Impaired flexibility, Pain, Decreased endurance, Improper body mechanics  Visit Diagnosis: Chronic pain of left knee  Muscle weakness (generalized)  Localized edema  Other symptoms and signs involving the musculoskeletal system     Problem List Patient Active Problem List   Diagnosis Date Noted  . Bilateral leg weakness 01/24/2015  . Elevated CPK 01/24/2015  . Transaminitis 10/24/2014   Ihor Austin, LPTA; [place]  Aldona Lento 10/07/2018, 12:14 PM  [place] [address] [place], [place], [address] Phone: [phone]   Fax:  [phone]  Name: TAKASHI KOROL MRN: [id number] Date of Birth: [birth year]

## 2018-10-09 ENCOUNTER — Encounter (HOSPITAL_COMMUNITY): Payer: Medicare Other

## 2018-10-13 ENCOUNTER — Encounter (HOSPITAL_COMMUNITY): Payer: Self-pay

## 2018-10-13 ENCOUNTER — Ambulatory Visit (HOSPITAL_COMMUNITY): Payer: PPO | Attending: Specialist

## 2018-10-13 DIAGNOSIS — R600 Localized edema: Secondary | ICD-10-CM

## 2018-10-13 DIAGNOSIS — R29898 Other symptoms and signs involving the musculoskeletal system: Secondary | ICD-10-CM | POA: Insufficient documentation

## 2018-10-13 DIAGNOSIS — M25562 Pain in left knee: Secondary | ICD-10-CM | POA: Diagnosis not present

## 2018-10-13 DIAGNOSIS — G8929 Other chronic pain: Secondary | ICD-10-CM | POA: Diagnosis not present

## 2018-10-13 DIAGNOSIS — M6281 Muscle weakness (generalized): Secondary | ICD-10-CM | POA: Diagnosis not present

## 2018-10-13 NOTE — Therapy (Signed)
[place], [place], [address] Phone: [phone]   Fax:  [phone]  Physical Therapy Treatment  Patient Details  Name: Jacob Hunter MRN: [id number] Date of Birth: 1956-01-02 Referring Provider (PT): Sydnee Cabal, MD   Encounter Date: 10/13/2018  PT End of Session - 10/13/18 0905    Visit Number  4    Number of Visits  8    Date for PT Re-Evaluation  10/30/18    Authorization Type  [employer]    Authorization Time Period  10/02/18 to 10/30/18    PT Start Time  0903    PT Stop Time  0944    PT Time Calculation (min)  41 min    Activity Tolerance  Patient tolerated treatment well   Exurction level 1-2/10   Behavior During Therapy  [facility] for tasks assessed/performed       Past Medical History:  Diagnosis Date  . Arthritis   . Hypercholesteremia   . Knee pain   . Necrotizing myopathy   . Polymyositis [facility])     Past Surgical History:  Procedure Laterality Date  . ANKLE SURGERY Left   . KNEE SURGERY Left     There were no vitals filed for this visit.  Subjective Assessment - 10/13/18 0900    Subjective  Pt stated he was sore for 2 days following last session.  No reports of pain currently.  Reports ease with descending stairs     Patient Stated Goals  function normally like my R knee    Currently in Pain?  No/denies                       [facility] Adult PT Treatment/Exercise - 10/13/18 0001      Knee/Hip Exercises: Stretches   Knee: Self-Stretch to increase Flexion  Left;5 reps    Knee: Self-Stretch Limitations  5x10" holds, 12" step    Gastroc Stretch  Both;3 reps;30 seconds    Gastroc Stretch Limitations  slant board      Knee/Hip Exercises: Standing   Heel Raises  Both;20 reps    Heel Raises Limitations  heel and toe incline slope    Lateral Step Up  Left;15 reps;Hand Hold: 1;Step Height: 6"    Lateral Step Up Limitations  min cues for TKE at top    Forward Step Up   Left;15 reps;Step Height: 6"    Forward Step Up Limitations  intermittent HHA    Step Down  Left;15 reps;Step Height: 6"    Functional Squat  10 reps    Functional Squat Limitations  front of chair for mechanics    SLS  Rt 14", Lt 8" max of 3    Other Standing Knee Exercises  Tandem stance 2x 30" on foam    Other Standing Knee Exercises  Sidestep with RTB 1RT      Knee/Hip Exercises: Seated   Sit to Sand  10 reps;without UE support      Manual Therapy   Manual Therapy  Edema management;Soft tissue mobilization    Manual therapy comments  completed separate rest of treatment    Edema Management  retro massage with BLE elevated    Soft tissue mobilization  STM to distal quad to reduce restrictions               PT Short Term Goals - 10/02/18 1241      PT SHORT TERM GOAL #1   Title  Pt will have reduced edema by 2cm or > at joint line in order to reduce pain and improve ROM.     Time  2    Period  Weeks    Status  New    Target Date  10/16/18      PT SHORT TERM GOAL #2   Title  Pt will have improved L knee flexion to 130deg or > to demo reduced edema and maximize his stair ambulation.     Time  2    Period  Weeks    Status  New      PT SHORT TERM GOAL #3   Title  Pt will be able to perform bil SLS for 10sec or > with min to no trunk lean over LLE to demo improved functional hip strength in order to maximize stair ambulation and gait on uneven ground.    Time  2    Period  Weeks    Status  New        PT Long Term Goals - 10/02/18 1242      PT LONG TERM GOAL #1   Title  Pt will have improved MMT to 5/5 to demo improved overall strength and maximize return to PLOF activities with greater ease.     Time  4    Period  Weeks    Status  New    Target Date  10/30/18      PT LONG TERM GOAL #2   Title  Pt will be able to perform 3MWT without increases in L knee pain and gait mechanics WFL to demo improved overall functional strength and maximize return to PLOF.      Time  4    Period  Weeks    Status  New      PT LONG TERM GOAL #3   Title  Pt will be able to demo 5 L single leg step downs with proper form and no L knee pain to demo improved functional hip strength and maximize stair ambulation with less pain.    Time  4    Period  Weeks    Status  New      PT LONG TERM GOAL #4   Title  Pt will report being able to squat down to lift items up off the floor with 2/10 L knee pain or < and with proper form to demo improved functional strength and maximize his ability to complete HH tasks with greater ease.     Time  4    Period  Weeks    Status  New            Plan - 10/13/18 1254    Clinical Impression Statement  Pt presents with trunk sidebending with SLS activities due partially to gluteal weakness.  Added sidestep and tandem stance for gluteal strengthening and balance training today wiht min guard wiht new activities.  Continued with stretches to address stiffness in knee and functional strengthening activities.  Able to increase height with step down with good form, noted visible muscle fatigue with activiites.  Monitored fatigue through session wiht 2/10 top level through session, seated rest breaks PRN.  EOS with manual retrograde massage for edema control.  No reoprts of increased pain through session.      Rehab Potential  Good    PT Frequency  2x / week    PT Duration  4 weeks    PT Treatment/Interventions  ADLs/Self Care Home Management;Aquatic Therapy;Cryotherapy;Electrical Stimulation;Moist Heat;Ultrasound;DME  Instruction;Gait training;Stair training;Functional mobility training;Therapeutic activities;Therapeutic exercise;Balance training;Neuromuscular re-education;Patient/family education;Orthotic Fit/Training;Manual techniques;Scar mobilization;Passive range of motion;Dry needling;Energy conservation;Taping    PT Next Visit Plan   monitor fatigue levels due to necrotizing myopathy; continue ROM and strenghtening work, address edema     PT Home Exercise Plan  eval: continue current HEP and added prone quad stretch, bridging, SLR       Patient will benefit from skilled therapeutic intervention in order to improve the following deficits and impairments:  Decreased activity tolerance, Decreased balance, Decreased range of motion, Decreased strength, Difficulty walking, Hypomobility, Increased edema, Increased fascial restricitons, Increased muscle spasms, Impaired flexibility, Pain, Decreased endurance, Improper body mechanics  Visit Diagnosis: Chronic pain of left knee  Muscle weakness (generalized)  Localized edema  Other symptoms and signs involving the musculoskeletal system     Problem List Patient Active Problem List   Diagnosis Date Noted  . Bilateral leg weakness 01/24/2015  . Elevated CPK 01/24/2015  . Transaminitis 10/24/2014   Ihor Austin, [place]; [place]  Aldona Lento 10/13/2018, 12:59 PM  [place] [address] [place], [place], [address] Phone: [phone]   Fax:  [phone]  Name: Jacob Hunter MRN: [id number] Date of Birth: 1956/06/21

## 2018-10-14 ENCOUNTER — Encounter (HOSPITAL_COMMUNITY): Payer: Medicare Other

## 2018-10-14 DIAGNOSIS — Z79899 Other long term (current) drug therapy: Secondary | ICD-10-CM | POA: Diagnosis not present

## 2018-10-14 DIAGNOSIS — G7289 Other specified myopathies: Secondary | ICD-10-CM | POA: Diagnosis not present

## 2018-10-15 ENCOUNTER — Telehealth (HOSPITAL_COMMUNITY): Payer: Self-pay | Admitting: Internal Medicine

## 2018-10-15 NOTE — Telephone Encounter (Signed)
10/15/18  Pt called on 3/5 to cx his 3/6 appt but gave no reason

## 2018-10-16 ENCOUNTER — Ambulatory Visit (HOSPITAL_COMMUNITY): Payer: PPO | Admitting: Physical Therapy

## 2018-10-19 ENCOUNTER — Encounter (HOSPITAL_COMMUNITY): Payer: Self-pay

## 2018-10-19 ENCOUNTER — Ambulatory Visit (HOSPITAL_COMMUNITY): Payer: PPO

## 2018-10-19 DIAGNOSIS — R600 Localized edema: Secondary | ICD-10-CM

## 2018-10-19 DIAGNOSIS — M25562 Pain in left knee: Secondary | ICD-10-CM | POA: Diagnosis not present

## 2018-10-19 DIAGNOSIS — M6281 Muscle weakness (generalized): Secondary | ICD-10-CM

## 2018-10-19 DIAGNOSIS — R29898 Other symptoms and signs involving the musculoskeletal system: Secondary | ICD-10-CM

## 2018-10-19 DIAGNOSIS — G8929 Other chronic pain: Secondary | ICD-10-CM

## 2018-10-19 NOTE — Therapy (Signed)
[place], [place], [address] Phone: [phone]   Fax:  [phone]  Physical Therapy Treatment  Patient Details  Name: Jacob Hunter MRN: [id number] Date of Birth: [birth year] Referring Provider (PT): Sydnee Cabal, MD   Encounter Date: 10/19/2018  PT End of Session - 10/19/18 1122    Visit Number  5    Number of Visits  8    Date for PT Re-Evaluation  10/30/18    Authorization Type  [employer]    Authorization Time Period  10/02/18 to 10/30/18    PT Start Time  1119    PT Stop Time  1200    PT Time Calculation (min)  41 min    Activity Tolerance  Patient tolerated treatment well   Exurction level 1-2/10   Behavior During Therapy  [facility] for tasks assessed/performed       Past Medical History:  Diagnosis Date  . Arthritis   . Hypercholesteremia   . Knee pain   . Necrotizing myopathy   . Polymyositis [facility])     Past Surgical History:  Procedure Laterality Date  . ANKLE SURGERY Left   . KNEE SURGERY Left     There were no vitals filed for this visit.  Subjective Assessment - 10/19/18 1121    Subjective  Pt states that he was so sore from his last session that he couldn't walk for 3 days. It took him to Saturday to be able to walk again.     Patient Stated Goals  function normally like my R knee    Currently in Pain?  No/denies             [facility] Adult PT Treatment/Exercise - 10/19/18 0001      Knee/Hip Exercises: Stretches   Passive Hamstring Stretch  Left;3 reps;30 seconds    Passive Hamstring Stretch Limitations  standing, slant board    Gastroc Stretch  Both;3 reps;30 seconds    Gastroc Stretch Limitations  slant board      Knee/Hip Exercises: Standing   Heel Raises  Both;20 reps    Heel Raises Limitations  heel and toe incline slope    Hip Abduction  AROM;Both;2 sets;10 reps    Abduction Limitations  RTB    Hip Extension  Both;2 sets;10 reps    Extension Limitations   diagonals, RTB    Functional Squat  10 reps;2 sets    Functional Squat Limitations  parallel bars for occasional balance support    SLS  BLE x5 reps holding as long as possible (11.7sec or < on R; 5sec or < on L)    SLS with Vectors  BLE x5RT, firm, 1 fingertip assist    Other Standing Knee Exercises  Sidestep with RTB 2RT      Knee/Hip Exercises: Supine   Short Arc Quad Sets  Left;1 set;10 reps    Short Arc Quad Sets Limitations  basketball    Bridges  Both;10 reps    Straight Leg Raises  Left;10 reps    Straight Leg Raises Limitations  min cues for form    Knee Flexion Limitations  130      Manual Therapy   Manual Therapy  Edema management    Manual therapy comments  completed separate rest of treatment    Edema Management  retro massage with BLE elevated             PT Education - 10/19/18 1122    Education Details  exercise technique, continue HEP    Person(s) Educated  Patient    Methods  Explanation;Demonstration    Comprehension  Verbalized understanding;Returned demonstration       PT Short Term Goals - 10/02/18 1241      PT SHORT TERM GOAL #1   Title  Pt will have reduced edema by 2cm or > at joint line in order to reduce pain and improve ROM.     Time  2    Period  Weeks    Status  New    Target Date  10/16/18      PT SHORT TERM GOAL #2   Title  Pt will have improved L knee flexion to 130deg or > to demo reduced edema and maximize his stair ambulation.     Time  2    Period  Weeks    Status  New      PT SHORT TERM GOAL #3   Title  Pt will be able to perform bil SLS for 10sec or > with min to no trunk lean over LLE to demo improved functional hip strength in order to maximize stair ambulation and gait on uneven ground.    Time  2    Period  Weeks    Status  New        PT Long Term Goals - 10/02/18 1242      PT LONG TERM GOAL #1   Title  Pt will have improved MMT to 5/5 to demo improved overall strength and maximize return to PLOF activities with  greater ease.     Time  4    Period  Weeks    Status  New    Target Date  10/30/18      PT LONG TERM GOAL #2   Title  Pt will be able to perform 3MWT without increases in L knee pain and gait mechanics WFL to demo improved overall functional strength and maximize return to PLOF.     Time  4    Period  Weeks    Status  New      PT LONG TERM GOAL #3   Title  Pt will be able to demo 5 L single leg step downs with proper form and no L knee pain to demo improved functional hip strength and maximize stair ambulation with less pain.    Time  4    Period  Weeks    Status  New      PT LONG TERM GOAL #4   Title  Pt will report being able to squat down to lift items up off the floor with 2/10 L knee pain or < and with proper form to demo improved functional strength and maximize his ability to complete HH tasks with greater ease.     Time  4    Period  Weeks    Status  New            Plan - 10/19/18 1208    Clinical Impression Statement  Pt presenting to therapy reporting significant DOMS limiting pt's lifestyle for 2-3 days after treatment session and bruising secondary to exercise intensity from his necrotizing myopathy. PT regressed therapy session this date and did not perform as much standing exercises and included more NWB strengthening this date. Ended with manual for edema control as he still has mild edema at joint line. PT discussed potentially early d/c next visit since he states that he is not feeling functionally limited from his  knee.     Rehab Potential  Good    PT Frequency  2x / week    PT Duration  4 weeks    PT Treatment/Interventions  ADLs/Self Care Home Management;Aquatic Therapy;Cryotherapy;Electrical Stimulation;Moist Heat;Ultrasound;DME Instruction;Gait training;Stair training;Functional mobility training;Therapeutic activities;Therapeutic exercise;Balance training;Neuromuscular re-education;Patient/family education;Orthotic Fit/Training;Manual techniques;Scar  mobilization;Passive range of motion;Dry needling;Energy conservation;Taping    PT Next Visit Plan  potentially early d/c;  monitor fatigue levels due to necrotizing myopathy; continue ROM and strenghtening work, address edema    PT Home Exercise Plan  eval: continue current HEP and added prone quad stretch, bridging, SLR    Consulted and Agree with Plan of Care  Patient       Patient will benefit from skilled therapeutic intervention in order to improve the following deficits and impairments:  Decreased activity tolerance, Decreased balance, Decreased range of motion, Decreased strength, Difficulty walking, Hypomobility, Increased edema, Increased fascial restricitons, Increased muscle spasms, Impaired flexibility, Pain, Decreased endurance, Improper body mechanics  Visit Diagnosis: Chronic pain of left knee  Muscle weakness (generalized)  Localized edema  Other symptoms and signs involving the musculoskeletal system     Problem List Patient Active Problem List   Diagnosis Date Noted  . Bilateral leg weakness 01/24/2015  . Elevated CPK 01/24/2015  . Transaminitis 10/24/2014       Geraldine Solar PT, DPT  [place] [address] [place], [place], [address] Phone: [phone]   Fax:  [phone]  Name: TIJUAN DANTES MRN: [id number] Date of Birth: 23-Feb-1956

## 2018-10-20 DIAGNOSIS — G7289 Other specified myopathies: Secondary | ICD-10-CM | POA: Diagnosis not present

## 2018-10-21 ENCOUNTER — Encounter (HOSPITAL_COMMUNITY): Payer: Medicare Other

## 2018-10-21 DIAGNOSIS — Z79899 Other long term (current) drug therapy: Secondary | ICD-10-CM | POA: Diagnosis not present

## 2018-10-21 DIAGNOSIS — G7289 Other specified myopathies: Secondary | ICD-10-CM | POA: Diagnosis not present

## 2018-10-23 ENCOUNTER — Encounter (HOSPITAL_COMMUNITY): Payer: Self-pay

## 2018-10-23 ENCOUNTER — Ambulatory Visit (HOSPITAL_COMMUNITY): Payer: PPO

## 2018-10-23 ENCOUNTER — Other Ambulatory Visit: Payer: Self-pay

## 2018-10-23 DIAGNOSIS — R29898 Other symptoms and signs involving the musculoskeletal system: Secondary | ICD-10-CM

## 2018-10-23 DIAGNOSIS — G8929 Other chronic pain: Secondary | ICD-10-CM

## 2018-10-23 DIAGNOSIS — M25562 Pain in left knee: Principal | ICD-10-CM

## 2018-10-23 DIAGNOSIS — R600 Localized edema: Secondary | ICD-10-CM

## 2018-10-23 DIAGNOSIS — M6281 Muscle weakness (generalized): Secondary | ICD-10-CM

## 2018-10-23 NOTE — Patient Instructions (Signed)
Access Code: [email or web address]  URL: [email or web address]  Date: 10/23/2018  Prepared by: Geraldine Solar   Exercises Seated Hamstring Stretch - 10 reps - 3 sets - 1x daily - 7x weekly Sidelying Hip Abduction - 10 reps - 3 sets - 1x daily - 7x weekly Standing Hip Abduction Kicks - 10 reps - 3 sets - 1x daily - 7x weekly Diagonal Hip Extension with Resistance - 10 reps - 3 sets - 1x daily - 7x weekly Side Stepping with Resistance at Ankles and Counter Support - 10 reps - 3 sets - 1x daily - 7x weekly Standing 3-Way Kick - 10 reps - 3 sets - 1x daily - 7x weekly Tandem Stance in Corner - 10 reps - 3 sets - as long as you can hold - 1x daily - 7x weekly Tandem Stance with Eyes Closed in Corner - 10 reps - 3 sets - 1x daily - 7x weekly

## 2018-10-23 NOTE — Therapy (Signed)
[facility] [address] [place], [place], [address] Phone: [phone]   Fax:  [phone]   PHYSICAL THERAPY DISCHARGE SUMMARY  Visits from Start of Care: 6  Current functional level related to goals / functional outcomes: See below   Remaining deficits: See below   Education / Equipment: HEP  Plan: Patient agrees to discharge.  Patient goals were met. Patient is being discharged due to meeting the stated rehab goals.  ?????     Physical Therapy Treatment  Patient Details  Name: Jacob Hunter MRN: [id number] Date of Birth: 05/22/1956 Referring Provider (PT): Sydnee Cabal, MD   Encounter Date: 10/23/2018  PT End of Session - 10/23/18 1115    Visit Number  6    Number of Visits  8    Date for PT Re-Evaluation  10/30/18    Authorization Type  [employer]    Authorization Time Period  10/02/18 to 10/30/18    PT Start Time  1115    PT Stop Time  1141    PT Time Calculation (min)  26 min    Activity Tolerance  Patient tolerated treatment well   Exurction level 1-2/10   Behavior During Therapy  [facility] for tasks assessed/performed       Past Medical History:  Diagnosis Date  . Arthritis   . Hypercholesteremia   . Knee pain   . Necrotizing myopathy   . Polymyositis [facility])     Past Surgical History:  Procedure Laterality Date  . ANKLE SURGERY Left   . KNEE SURGERY Left     There were no vitals filed for this visit.  Subjective Assessment - 10/23/18 1115    Subjective  Pt reports that he was not as sore following last session. He was a little sore but nothinh like after previous sessions.     Patient Stated Goals  function normally like my R knee    Currently in Pain?  No/denies         [facility] PT Assessment - 10/23/18 0001      Assessment   Medical Diagnosis  L knee scope    Referring Provider (PT)  Sydnee Cabal, MD    Onset Date/Surgical Date  09/24/18    Next MD Visit  11/02/18    Prior Therapy   yes for necrotizing myopathy in 2017      Circumferential Edema   Circumferential - Left   37.8cm, joint line   was 40.5cm joint line     Step Down   Comments  x5 reps on 7" step:       AROM   Left Knee Extension  0    Left Knee Flexion  130   was 117     Strength   Right Hip Extension  5/5   was 4   Right Hip ABduction  5/5   was 4+   Left Hip Extension  5/5   was 4-   Left Hip ABduction  4+/5   was 4   Right Knee Flexion  5/5   was 4+   Left Knee Flexion  5/5   was 4+     Ambulation/Gait   Ambulation Distance (Feet)  850 Feet   3MWT; was 653f   Assistive device  None    Gait Pattern  Within Functional Limits    Gait Comments  no increased pain, just some fatigue      Balance   Balance Assessed  Yes  Static Standing Balance   Static Standing - Balance Support  No upper extremity supported    Static Standing Balance -  Activities   Single Leg Stance - Right Leg;Single Leg Stance - Left Leg    Static Standing - Comment/# of Minutes  R: 10sec L: 9sec or < with mild-mod trunk lean   was 6.7sec on R, 5sec or < on L         PT Education - 10/23/18 1145    Education Details  reassessment findings, d/c plans    Person(s) Educated  Patient    Methods  Explanation;Demonstration;Handout    Comprehension  Verbalized understanding         PT Short Term Goals - 10/23/18 1118      PT SHORT TERM GOAL #1   Title  Pt will have reduced edema by 2cm or > at joint line in order to reduce pain and improve ROM.     Time  2    Period  Weeks    Status  Achieved    Target Date  10/16/18      PT SHORT TERM GOAL #2   Title  Pt will have improved L knee flexion to 130deg or > to demo reduced edema and maximize his stair ambulation.     Time  2    Period  Weeks    Status  Achieved      PT SHORT TERM GOAL #3   Title  Pt will be able to perform bil SLS for 10sec or > with min to no trunk lean over LLE to demo improved functional hip strength in order to maximize stair  ambulation and gait on uneven ground.    Time  2    Period  Weeks    Status  Partially Met        PT Long Term Goals - 10/23/18 1119      PT LONG TERM GOAL #1   Title  Pt will have improved MMT to 5/5 to demo improved overall strength and maximize return to PLOF activities with greater ease.     Time  4    Period  Weeks    Status  Partially Met      PT LONG TERM GOAL #2   Title  Pt will be able to perform 3MWT without increases in L knee pain and gait mechanics WFL to demo improved overall functional strength and maximize return to PLOF.     Time  4    Period  Weeks    Status  Achieved      PT LONG TERM GOAL #3   Title  Pt will be able to demo 5 L single leg step downs with proper form and no L knee pain to demo improved functional hip strength and maximize stair ambulation with less pain.    Time  4    Period  Weeks    Status  Achieved      PT LONG TERM GOAL #4   Title  Pt will report being able to squat down to lift items up off the floor with 2/10 L knee pain or < and with proper form to demo improved functional strength and maximize his ability to complete HH tasks with greater ease.     Time  4    Period  Weeks    Status  Achieved            Plan - 10/23/18 1145    Clinical Impression  Statement  PT reassessed pt's goals and outcome measures this date. Pt has partially met 2 goals while meeting all other goals. AROM 0-130deg, strength 4+-5/5 throughout all mm groups, stairs are WNL, and his 3MWT has normalized. Pt does not need any further skilled PT intervention and he was d/c to his HEP.     Rehab Potential  Good    PT Frequency  2x / week    PT Duration  4 weeks    PT Treatment/Interventions  ADLs/Self Care Home Management;Aquatic Therapy;Cryotherapy;Electrical Stimulation;Moist Heat;Ultrasound;DME Instruction;Gait training;Stair training;Functional mobility training;Therapeutic activities;Therapeutic exercise;Balance training;Neuromuscular  re-education;Patient/family education;Orthotic Fit/Training;Manual techniques;Scar mobilization;Passive range of motion;Dry needling;Energy conservation;Taping    PT Next Visit Plan  d/c to HEP    PT Home Exercise Plan  eval: continue current HEP and added prone quad stretch, bridging, SLR; 3/13: see below for extensive additions    Consulted and Agree with Plan of Care  Patient       Patient will benefit from skilled therapeutic intervention in order to improve the following deficits and impairments:  Decreased activity tolerance, Decreased balance, Decreased range of motion, Decreased strength, Difficulty walking, Hypomobility, Increased edema, Increased fascial restricitons, Increased muscle spasms, Impaired flexibility, Pain, Decreased endurance, Improper body mechanics  Visit Diagnosis: Chronic pain of left knee  Muscle weakness (generalized)  Localized edema  Other symptoms and signs involving the musculoskeletal system     Problem List Patient Active Problem List   Diagnosis Date Noted  . Bilateral leg weakness 01/24/2015  . Elevated CPK 01/24/2015  . Transaminitis 10/24/2014        Geraldine Solar PT, DPT  [facility] [address] [place], [place], [address] Phone: [phone]   Fax:  [phone]  Name: ANDIE MORTIMER MRN: [id number] Date of Birth: 1956/05/16

## 2018-10-26 ENCOUNTER — Ambulatory Visit (HOSPITAL_COMMUNITY): Payer: PPO

## 2018-10-28 DIAGNOSIS — Z79899 Other long term (current) drug therapy: Secondary | ICD-10-CM | POA: Diagnosis not present

## 2018-10-28 DIAGNOSIS — G7289 Other specified myopathies: Secondary | ICD-10-CM | POA: Diagnosis not present

## 2018-11-04 DIAGNOSIS — Z79899 Other long term (current) drug therapy: Secondary | ICD-10-CM | POA: Diagnosis not present

## 2018-11-04 DIAGNOSIS — G7289 Other specified myopathies: Secondary | ICD-10-CM | POA: Diagnosis not present

## 2018-11-24 DIAGNOSIS — G894 Chronic pain syndrome: Secondary | ICD-10-CM | POA: Diagnosis not present

## 2018-11-24 DIAGNOSIS — G4733 Obstructive sleep apnea (adult) (pediatric): Secondary | ICD-10-CM | POA: Diagnosis not present

## 2018-11-24 DIAGNOSIS — G7289 Other specified myopathies: Secondary | ICD-10-CM | POA: Diagnosis not present

## 2018-11-24 DIAGNOSIS — M79671 Pain in right foot: Secondary | ICD-10-CM | POA: Diagnosis not present

## 2018-11-24 DIAGNOSIS — M79672 Pain in left foot: Secondary | ICD-10-CM | POA: Diagnosis not present

## 2018-11-24 DIAGNOSIS — I5032 Chronic diastolic (congestive) heart failure: Secondary | ICD-10-CM | POA: Diagnosis not present

## 2018-11-24 DIAGNOSIS — I272 Pulmonary hypertension, unspecified: Secondary | ICD-10-CM | POA: Diagnosis not present

## 2018-11-24 DIAGNOSIS — E1140 Type 2 diabetes mellitus with diabetic neuropathy, unspecified: Secondary | ICD-10-CM | POA: Diagnosis not present

## 2018-11-24 DIAGNOSIS — M545 Low back pain: Secondary | ICD-10-CM | POA: Diagnosis not present

## 2018-11-24 DIAGNOSIS — M79606 Pain in leg, unspecified: Secondary | ICD-10-CM | POA: Diagnosis not present

## 2018-11-24 DIAGNOSIS — D709 Neutropenia, unspecified: Secondary | ICD-10-CM | POA: Diagnosis not present

## 2018-11-24 DIAGNOSIS — Z79899 Other long term (current) drug therapy: Secondary | ICD-10-CM | POA: Diagnosis not present

## 2018-11-24 DIAGNOSIS — J9611 Chronic respiratory failure with hypoxia: Secondary | ICD-10-CM | POA: Diagnosis not present

## 2018-11-24 DIAGNOSIS — E109 Type 1 diabetes mellitus without complications: Secondary | ICD-10-CM | POA: Diagnosis not present

## 2018-11-24 DIAGNOSIS — E871 Hypo-osmolality and hyponatremia: Secondary | ICD-10-CM | POA: Diagnosis not present

## 2018-11-24 DIAGNOSIS — Z79891 Long term (current) use of opiate analgesic: Secondary | ICD-10-CM | POA: Diagnosis not present

## 2018-11-24 DIAGNOSIS — R0602 Shortness of breath: Secondary | ICD-10-CM | POA: Diagnosis not present

## 2018-11-24 DIAGNOSIS — C3490 Malignant neoplasm of unspecified part of unspecified bronchus or lung: Secondary | ICD-10-CM | POA: Diagnosis not present

## 2018-11-24 DIAGNOSIS — M130 Polyarthritis, unspecified: Secondary | ICD-10-CM | POA: Diagnosis not present

## 2018-11-24 DIAGNOSIS — Z23 Encounter for immunization: Secondary | ICD-10-CM | POA: Diagnosis not present

## 2018-12-04 DIAGNOSIS — H9011 Conductive hearing loss, unilateral, right ear, with unrestricted hearing on the contralateral side: Secondary | ICD-10-CM | POA: Diagnosis not present

## 2018-12-04 DIAGNOSIS — T161XXA Foreign body in right ear, initial encounter: Secondary | ICD-10-CM | POA: Diagnosis not present

## 2018-12-07 ENCOUNTER — Other Ambulatory Visit: Payer: Self-pay | Admitting: *Deleted

## 2018-12-07 NOTE — Patient Outreach (Signed)
[facility]N Follow up call.  Spoke with Jacob Hunter this afternoon, Jacob Hunter was not able to come to the phone. I briefly explained the reason for the call and requested a return call. Jacob Hunter assured me pt would call me tomorrow.  [name]. [name], MSN, [facility] Gerontological Nurse Practitioner [facility] Care Management [phone]

## 2018-12-08 ENCOUNTER — Other Ambulatory Visit: Payer: Self-pay | Admitting: *Deleted

## 2018-12-08 ENCOUNTER — Encounter: Payer: Self-pay | Admitting: *Deleted

## 2018-12-08 NOTE — Patient Outreach (Signed)
[facility] Follow up call.  Mr. Detweiler called me back this morning. Verified his name and DOB. Advised him of [facility] services.  We discussed his rare autoimmune disease: necrotizing myopathy, that was diagnosed by Dr. Aurelio Brash at [facility] sees him regulary. He also sees his primary care doctor ususally 2 times a year. He has already has his AWV.   He has to have chemo infusions of solumedrol and Rituximab to control his disease.  He and his wife are quite used to living with this problems now and his wife is very alert to any initial sx.  He reports this disease does not seem to be a limitation on his life expectancy.  He has agreed to receive a call from me in 6 months. He has my name and number and I will be sending him our information.  [name]. [name], MSN, [facility] Gerontological Nurse Practitioner [facility] Care Management [phone]

## 2018-12-22 DIAGNOSIS — G7289 Other specified myopathies: Secondary | ICD-10-CM | POA: Diagnosis not present

## 2019-01-13 DIAGNOSIS — M1712 Unilateral primary osteoarthritis, left knee: Secondary | ICD-10-CM | POA: Diagnosis not present

## 2019-01-19 DIAGNOSIS — G7289 Other specified myopathies: Secondary | ICD-10-CM | POA: Diagnosis not present

## 2019-01-20 DIAGNOSIS — M1712 Unilateral primary osteoarthritis, left knee: Secondary | ICD-10-CM | POA: Diagnosis not present

## 2019-01-27 DIAGNOSIS — M1712 Unilateral primary osteoarthritis, left knee: Secondary | ICD-10-CM | POA: Diagnosis not present

## 2019-01-29 ENCOUNTER — Encounter: Payer: Self-pay | Admitting: *Deleted

## 2019-01-29 ENCOUNTER — Encounter (HOSPITAL_COMMUNITY): Payer: Self-pay

## 2019-01-29 ENCOUNTER — Emergency Department (HOSPITAL_COMMUNITY)
Admission: EM | Admit: 2019-01-29 | Discharge: 2019-01-29 | Disposition: A | Discharge status: Home / Self Care | Payer: PPO | Attending: Emergency Medicine | Admitting: Emergency Medicine

## 2019-01-29 ENCOUNTER — Other Ambulatory Visit: Payer: Self-pay

## 2019-01-29 DIAGNOSIS — Y92513 Shop (commercial) as the place of occurrence of the external cause: Secondary | ICD-10-CM | POA: Insufficient documentation

## 2019-01-29 DIAGNOSIS — S81812A Laceration without foreign body, left lower leg, initial encounter: Secondary | ICD-10-CM | POA: Diagnosis not present

## 2019-01-29 DIAGNOSIS — Y939 Activity, unspecified: Secondary | ICD-10-CM | POA: Insufficient documentation

## 2019-01-29 DIAGNOSIS — W268XXA Contact with other sharp object(s), not elsewhere classified, initial encounter: Secondary | ICD-10-CM | POA: Diagnosis not present

## 2019-01-29 DIAGNOSIS — S81811A Laceration without foreign body, right lower leg, initial encounter: Secondary | ICD-10-CM | POA: Diagnosis not present

## 2019-01-29 DIAGNOSIS — Z79899 Other long term (current) drug therapy: Secondary | ICD-10-CM | POA: Insufficient documentation

## 2019-01-29 DIAGNOSIS — Y999 Unspecified external cause status: Secondary | ICD-10-CM | POA: Diagnosis not present

## 2019-01-29 MED ORDER — CEPHALEXIN 500 MG PO CAPS: 500.0000 mg | ORAL_CAPSULE | Freq: Four times a day (QID) | ORAL | 0 refills | Status: DC

## 2019-01-29 NOTE — Discharge Instructions (Signed)
You were seen in the emergency department today for a laceration. Your laceration was closed with stitches. Please keep this area clean and dry for the next 24 hours, after 24 hours you may get this area wet, but avoid soaking the area. Keep the area covered as best possible especially when in the sun to help in minimizing scarring. I have prescribed you Keflex (antibiotic). Please take medication as prescribed.   Your tetanus is up to date.  You will need to have the stitches removed and the wound rechecked in 7-10 days. Please return to the emergency department, go to an urgent care, or see your primary care provider to have this performed. Return to the ER soon should you start to experience pus type drainage from the wound, redness around the wound, or fevers as this could indicate the area is infected, please return to the ER for any other worsening symptoms or concerns that you may have.

## 2019-01-29 NOTE — ED Provider Notes (Signed)
[facility] EMERGENCY DEPARTMENT Provider Note   CSN: [id number] Arrival date & time: 01/29/19  1517    History   Chief Complaint Chief Complaint  Patient presents with  . Extremity Laceration    HPI Jacob Hunter is a 63 y.o. male with a PMH of Hypercholesteremia, Arthritis, Necrotizing myopathy, and Polymyositis presents with a right lower extremity laceration onset 30 minutes prior to arrival. Patient reports he was was at an RV supply store when he cut his leg on a plastic item on the floor. Patient states bleeding was controlled with pressure and denies taking any medications. Patient states he is able to ambulate and move his extremity without difficulty. Patient reports chronic paresthesias, but denies any new symptoms. Patient denies numbness or weakness. Patient denies fever, chills, cough, nausea, vomiting, or abdominal pain. Patient reports last tetanus shot was 1 year ago.      HPI  Past Medical History:  Diagnosis Date  . Arthritis   . Hypercholesteremia   . Knee pain   . Necrotizing myopathy   . Polymyositis [facility])     Patient Active Problem List   Diagnosis Date Noted  . Pain in left knee 07/21/2018  . Anxiety and depression 12/30/2015  . Impaired mobility and activities of daily living 12/30/2015  . 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 Bilateral leg weakness 01/24/2015  . Elevated CPK 01/24/2015  . Transaminitis 10/24/2014    Past Surgical History:  Procedure Laterality Date  . ANKLE SURGERY Left   . KNEE SURGERY Left         Home Medications    Prior to Admission medications   Medication Sig Start Date End Date Taking? Authorizing Provider  ascorbic acid (VITAMIN C) 500 MG tablet Take 500 mg by mouth daily.    [provider]  cephALEXin (KEFLEX) 500 MG capsule Take 1 capsule (500 mg total) by mouth 4 (four) times daily. 01/29/19   Darlin Drop P, PA-C  Coenzyme Q10 (CO Q 10) 10 MG CAPS Take 10 mg by mouth daily.     [provider]  FLUoxetine (PROZAC) 20 MG capsule Take 20 mg by mouth daily.    [provider]  folic acid (FOLVITE) 1 MG tablet Take 1 mg by mouth daily.    [provider]  methotrexate (RHEUMATREX) 2.5 MG tablet Take 25 mg by mouth once a week. Caution:Chemotherapy. Protect from light.(monday) 10 tabs total    [provider]  methylPREDNISolone sodium succinate (SOLU-MEDROL) 1000 MG injection Inject 1,000 mg into the vein once.    [provider]  predniSONE (DELTASONE) 10 MG tablet Take 10 mg by mouth daily with breakfast.    [provider]  riTUXimab (RITUXAN) 100 MG/10ML injection Inject into the vein.    [provider]    Family History Family History  Problem Relation Age of Onset  . Hypertension Father   . Hypercholesterolemia Father   . Hypercholesterolemia Sister   . Hypertension Mother   . Breast cancer Sister   . Colon cancer Neg Hx   . Liver disease Neg Hx     Social History Social History   Tobacco Use  . Smoking status: Never Smoker  . Smokeless tobacco: Never Used  Substance Use Topics  . Alcohol use: No    Alcohol/week: 0.0 standard drinks    Comment: Last ETOH was 1-2 drinks in 1990.  . Drug use: No     Allergies   Methylprednisolone, Other, and Statins  Review of Systems Review of Systems  Constitutional: Negative for chills, diaphoresis and fever.  HENT: Negative for congestion and rhinorrhea.   Respiratory: Negative for cough and shortness of breath.   Cardiovascular: Negative for chest pain.  Gastrointestinal: Negative for abdominal pain, nausea and vomiting.  Endocrine: Negative for cold intolerance and heat intolerance.  Musculoskeletal: Negative for gait problem.  Skin: Positive for wound. Negative for rash.  Allergic/Immunologic: Positive for immunocompromised state.  Neurological: Negative for weakness and numbness.  Hematological: Negative for adenopathy.      Physical Exam Updated Vital Signs BP 139/78 (BP Location: Right Arm)   Pulse 81   Temp 98 F (36.7 C) (Oral)   Resp 18   SpO2 96%   Physical Exam Vitals signs and nursing note reviewed.  Constitutional:      General: He is not in acute distress.    Appearance: He is well-developed. He is not diaphoretic.  HENT:     Head: Normocephalic and atraumatic.  Neck:     Musculoskeletal: Normal range of motion.  Cardiovascular:     Rate and Rhythm: Normal rate and regular rhythm.     Heart sounds: Normal heart sounds. No murmur. No friction rub. No gallop.   Pulmonary:     Effort: Pulmonary effort is normal. No respiratory distress.     Breath sounds: Normal breath sounds. No wheezing or rales.  Abdominal:     Palpations: Abdomen is soft.     Tenderness: There is no abdominal tenderness.  Musculoskeletal: Normal range of motion.     Right knee: Normal. He exhibits normal range of motion, no swelling, no effusion and no ecchymosis. No tenderness found.     Right ankle: Normal. He exhibits normal range of motion, no swelling and no ecchymosis. No tenderness.  Skin:    General: Skin is warm.     Findings: Laceration present. No erythema or rash.       Neurological:     Mental Status: He is alert.     Sensory: Sensation is intact.     Motor: Motor function is intact.    ED Treatments / Results  Labs (all labs ordered are listed, but only abnormal results are displayed) Labs Reviewed - No data to display  EKG None  Radiology No results found.  Procedures .Marland KitchenLaceration Repair  Date/Time: 01/29/2019 5:04 PM Performed by: Arville Lime, PA-C Authorized by: Arville Lime, PA-C   Consent:    Consent obtained:  Verbal   Consent given by:  Patient   Risks discussed:  Infection, need for additional repair, pain, poor cosmetic result and poor wound healing   Alternatives discussed:  No treatment and delayed treatment Universal protocol:    Procedure explained and  questions answered to patient or proxy's satisfaction: yes     Relevant documents present and verified: yes     Test results available and properly labeled: yes     Imaging studies available: yes     Required blood products, implants, devices, and special equipment available: yes     Site/side marked: yes     Immediately prior to procedure, a time out was called: yes     Patient identity confirmed:  Verbally with patient Anesthesia (see MAR for exact dosages):    Anesthesia method:  Local infiltration   Local anesthetic:  Lidocaine 1% w/o epi Laceration details:    Location:  Leg   Leg location:  R lower leg   Length (cm):  7  Depth (mm):  3 Pre-procedure details:    Preparation:  Patient was prepped and draped in usual sterile fashion Exploration:    Hemostasis achieved with:  Direct pressure   Wound exploration: wound explored through full range of motion     Contaminated: no   Treatment:    Area cleansed with:  Betadine   Amount of cleaning:  Standard   Irrigation solution:  Sterile saline   Irrigation method:  Pressure wash   Visualized foreign bodies/material removed: no   Skin repair:    Repair method:  Sutures   Suture size:  4-0   Suture material:  Prolene   Suture technique:  Simple interrupted   Number of sutures:  9 Approximation:    Approximation:  Close Post-procedure details:    Dressing:  Bulky dressing   Patient tolerance of procedure:  Tolerated well, no immediate complications   (including critical care time)  Medications Ordered in ED Medications - No data to display   Initial Impression / Assessment and Plan / ED Course  I have reviewed the triage vital signs and the nursing notes.  Pertinent labs & imaging results that were available during my care of the patient were reviewed by me and considered in my medical decision making (see chart for details).       Patient presents with a right leg laceration. Pressure irrigation performed. Wound  explored and base of wound visualized in a bloodless field without evidence of foreign body.  Laceration occurred < 8 hours prior to repair which was well tolerated. Tdap is up to date. Pt has comorbidity to effect normal wound healing. Pt discharged with antibiotics.  Discussed suture home care with patient and answered questions. Pt to follow-up for wound check and suture removal in 7 days; they are to return to the ED sooner for signs of infection. Pt is hemodynamically stable with no complaints prior to dc.   Final Clinical Impressions(s) / ED Diagnoses   Final diagnoses:  Laceration of left lower extremity, initial encounter    ED Discharge Orders         Ordered    cephALEXin (KEFLEX) 500 MG capsule  4 times daily     01/29/19 1701           Darlin Drop [place], [place] 01/29/19 1706    Dorie Rank, MD 01/30/19 1550

## 2019-01-29 NOTE — ED Triage Notes (Signed)
Pt presents for right ankle laceration that occurred x30 minutes while pt was at an RV supply store. Bleeding controlled at this time, pt states site feels as though it is throbbing. Pt able to bear weight, pt has full ROM to extremity.

## 2019-02-02 DIAGNOSIS — G7289 Other specified myopathies: Secondary | ICD-10-CM | POA: Diagnosis not present

## 2019-02-02 DIAGNOSIS — Z79899 Other long term (current) drug therapy: Secondary | ICD-10-CM | POA: Diagnosis not present

## 2019-02-02 DIAGNOSIS — R5383 Other fatigue: Secondary | ICD-10-CM | POA: Diagnosis not present

## 2019-02-02 DIAGNOSIS — Z7952 Long term (current) use of systemic steroids: Secondary | ICD-10-CM | POA: Diagnosis not present

## 2019-02-09 DIAGNOSIS — Z4802 Encounter for removal of sutures: Secondary | ICD-10-CM | POA: Diagnosis not present

## 2019-02-09 DIAGNOSIS — S81801D Unspecified open wound, right lower leg, subsequent encounter: Secondary | ICD-10-CM | POA: Diagnosis not present

## 2019-02-16 DIAGNOSIS — Z9889 Other specified postprocedural states: Secondary | ICD-10-CM | POA: Diagnosis not present

## 2019-02-16 DIAGNOSIS — G7289 Other specified myopathies: Secondary | ICD-10-CM | POA: Diagnosis not present

## 2019-03-10 DIAGNOSIS — M1712 Unilateral primary osteoarthritis, left knee: Secondary | ICD-10-CM | POA: Diagnosis not present

## 2019-03-18 DIAGNOSIS — G7289 Other specified myopathies: Secondary | ICD-10-CM | POA: Diagnosis not present

## 2019-03-18 DIAGNOSIS — Z79899 Other long term (current) drug therapy: Secondary | ICD-10-CM | POA: Diagnosis not present

## 2019-04-14 DIAGNOSIS — G7289 Other specified myopathies: Secondary | ICD-10-CM | POA: Diagnosis not present

## 2019-04-14 DIAGNOSIS — Z79899 Other long term (current) drug therapy: Secondary | ICD-10-CM | POA: Diagnosis not present

## 2019-04-15 DIAGNOSIS — G7289 Other specified myopathies: Secondary | ICD-10-CM | POA: Diagnosis not present

## 2019-04-15 DIAGNOSIS — Z79899 Other long term (current) drug therapy: Secondary | ICD-10-CM | POA: Diagnosis not present

## 2019-04-21 DIAGNOSIS — G7289 Other specified myopathies: Secondary | ICD-10-CM | POA: Diagnosis not present

## 2019-04-21 DIAGNOSIS — Z79899 Other long term (current) drug therapy: Secondary | ICD-10-CM | POA: Diagnosis not present

## 2019-04-28 DIAGNOSIS — Z79899 Other long term (current) drug therapy: Secondary | ICD-10-CM | POA: Diagnosis not present

## 2019-04-28 DIAGNOSIS — G7289 Other specified myopathies: Secondary | ICD-10-CM | POA: Diagnosis not present

## 2019-05-05 DIAGNOSIS — G7289 Other specified myopathies: Secondary | ICD-10-CM | POA: Diagnosis not present

## 2019-05-05 DIAGNOSIS — Z79899 Other long term (current) drug therapy: Secondary | ICD-10-CM | POA: Diagnosis not present

## 2019-05-17 DIAGNOSIS — G7289 Other specified myopathies: Secondary | ICD-10-CM | POA: Diagnosis not present

## 2019-06-01 ENCOUNTER — Encounter: Payer: Self-pay | Admitting: Internal Medicine

## 2019-06-08 ENCOUNTER — Ambulatory Visit: Payer: PPO | Admitting: *Deleted

## 2019-06-21 DIAGNOSIS — G7289 Other specified myopathies: Secondary | ICD-10-CM | POA: Diagnosis not present

## 2019-06-29 ENCOUNTER — Other Ambulatory Visit: Payer: Self-pay

## 2019-06-29 ENCOUNTER — Ambulatory Visit (INDEPENDENT_AMBULATORY_CARE_PROVIDER_SITE_OTHER): Payer: Self-pay | Admitting: *Deleted

## 2019-06-29 DIAGNOSIS — Z1211 Encounter for screening for malignant neoplasm of colon: Secondary | ICD-10-CM

## 2019-06-29 MED ORDER — PEG 3350-KCL-NA BICARB-NACL 420 G PO SOLR: 4000.0000 mL | Freq: Once | ORAL | 0 refills | Status: AC

## 2019-06-29 NOTE — Progress Notes (Signed)
Gastroenterology Pre-Procedure Review  Request Date: 06/29/2019 Requesting Physician: 10 year recall, Last TCS 06/28/2009 done by Dr. Gala Romney, hyperplastic polyp  PATIENT REVIEW QUESTIONS: The patient responded to the following health history questions as indicated:    1. Diabetes Melitis: no 2. Joint replacements in the past 12 months: no 3. Major health problems in the past 3 months: no 4. Has an artificial valve or MVP: no 5. Has a defibrillator: no 6. Has been advised in past to take antibiotics in advance of a procedure like teeth cleaning: no 7. Family history of colon cancer:  no 8. Alcohol Use: no 9. Illicit drug Use: no 10. History of sleep apnea:  no 11. History of coronary artery or other vascular stents placed within the last 12 months: no 12. History of any prior anesthesia complications: yes, pt says it takes a long time for him to wake up 13. There is no height or weight on file to calculate BMI.ht: 6'1 wt: 234 lbs     MEDICATIONS & ALLERGIES:    Patient reports the following regarding taking any blood thinners:   Plavix? no Aspirin? no Coumadin? no Brilinta? no Xarelto? no Eliquis? no Pradaxa? no Savaysa? no Effient? no  Patient confirms/reports the following medications:  Current Outpatient Medications  Medication Sig Dispense Refill  . ascorbic acid (VITAMIN C) 500 MG tablet Take 1,000 mg by mouth daily.     . calcium-vitamin D (OSCAL WITH D) 500-200 MG-UNIT tablet Take 1 tablet by mouth daily.    . Coenzyme Q10 (CO Q 10) 10 MG CAPS Take 10 mg by mouth daily.    [name] FLUoxetine (PROZAC) 20 MG capsule Take 20 mg by mouth daily.    . folic acid (FOLVITE) 1 MG tablet Take 1 mg by mouth daily.    . methotrexate (RHEUMATREX) 2.5 MG tablet Take 25 mg by mouth once a week. Caution:Chemotherapy. Protect from light.(monday) 10 tabs total    . methylPREDNISolone sodium succinate (SOLU-MEDROL) 1000 MG injection Inject 1,000 mg into the vein once.    . predniSONE  (DELTASONE) 10 MG tablet Take 15 mg by mouth daily with breakfast.     . riTUXimab (RITUXAN) 100 MG/10ML injection Inject into the vein.     No current facility-administered medications for this visit.     Patient confirms/reports the following allergies:  Allergies  Allergen Reactions  . Methylprednisolone Anaphylaxis    Rash, SOB, ?anaphylaxis?  . Other     High doses of steroids causes chest pain.   . 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   AUTHORIZATION INFORMATION Primary Insurance: [employer],  ID #: [id number] Pre-Cert / Josem Kaufmann required: No, not required  SCHEDULE INFORMATION: Procedure has been scheduled as follows:  Date: 09/17/2019, Time: 12:45 Location: [facility] with Dr. Gala Romney  This Gastroenterology Pre-Precedure Review Form is being routed to the following provider(s): Aliene Altes, PA

## 2019-06-29 NOTE — Progress Notes (Signed)
OK to schedule

## 2019-06-29 NOTE — Patient Instructions (Addendum)
[place]   Please notify us immediately if you are diabetic, take iron supplements, or if you are on coumadin or any blood thinners.   Patient Name: Jacob Hunter Date of procedure: 10/05/2019 Time to register at [place] Stay: 8:00 am Provider: Dr. Gala Romney   Purchase: MIRALAX 238 gram bottle, 1 FLEET ENEMA, 1 box of DULCOLAX (All over the counter medications)    10/03/2019- 2 Days prior to procedure: START CLEAR LIQUID DIET AFTER YOUR LUNCH MEAL--NO SOLID FOODS!   10/04/2019- 1 Day prior to procedure:   CLEAR LIQUIDS ALL DAY--NO SOLID FOODS!   Diabetic medication adjustments for today:  n/a  At 10:00 AM, take 2 DULCOLAX '5mg'$  tablets   At 12:00 PM, Mix 5 teaspoons of Miralax in any 4-6 ounces of CLEAR LIQUIDS (Gatorade) every hour for 5 hours until passing clear, watery stools. Be sure to drink 4 ounces of clear liquid 30 minutes after each dose of Miralax.   At 3:00 PM, take 2 Dulcolax '5mg'$  tablets   If stools are not clear & watery by 6:00 PM, take 5 teaspoons of Miralax every 30 minutes until stools are clear (no color)   You must have a complete prep to ensure the most effective cleaning.   CONTINUE CLEAR LIQUIDS ONLY UNTIL MIDNIGHT. Make a conscious effort to drink as much as you can before, during & after the preparation.    NOTHING TO EAT OR DRINK AFTER MIDNIGHT except for your heart, blood pressure & breathing medications. You may take them with a sip of clear liquids.    10/05/2019- Day of Procedure  Give yourself one Fleet enema about 1 hour prior to leaving for the hospital.   Diabetic medication adjustments for today: n/a  You may take TYLENOL products. Please continue your regular medications unless we have instructed you otherwise.    Please note, on the day of your procedure you MUST be accompanied by an adult who is willing to assume responsibility for you at time of discharge. If you do not have such person with you,  your procedure will have to be rescheduled.                                                             Please leave ALL jewelry at home prior to coming to the hospital for your procedure.   *It is your responsibility to check with your insurance company for the benefits of coverage you have for this procedure. Unfortunately, not all insurance companies have benefits to cover all or part of these types of procedures. It is your responsibility to check your benefits, however we will be glad to assist you with any codes your insurance company may need.   Please note that most insurance companies will not cover a screening colonoscopy for people under the age of 50. For example, with some insurance companies you may have benefits for a screening colonoscopy, but if polyps are found the diagnosis will change and then you may have a deductible that will need to be met. Please make sure you check your benefits for screening colonoscopy as well as a diagnostic colonoscopy.    CLEAR LIQUIDS: (NO RED)  Jello Apple Juice White Grape Juice Water  Banana popsicles Kool-Aid Coffee(No cream or milk)  Tea (  No cream or milk) Soft drinks Broth (fat free beef/chicken/vegetable)   Clear liquids allow you to see your fingers on the other side of the glass. Be sure they are NOT RED in color, cloudy, but CLEAR.   Do Not Eat:  Dairy products of any kind Cranberry juice  Tomato or V8 Juice Orange Juice  Grapefruit Juice Red Grape Juice  Solid foods like cereal, oatmeal, yogurt, fruits, vegetables, creamed soups, eggs, bread, etc   HELPFUL HINTS TO MAKE DRINKING EASIER:  -Make sure prep is extremely COLD. Refrigerate the night before. You may also put in freezer.  -You may try mixing Crystal Light or Country Time Lemonade if you prefer. MIx in small amounts. Add more if necessary.  -Trying drinking through a straw.  -Rinse mouth with water or mouthwash  between glasses to remove aftertaste.  -Try sipping on a cold beverage/ice popsicles between glasses of prep.  -Place a piece of sugar-free hard candy in mouth between glasses.  -If you become nauseated, try consuming smaller amounts or stretch out the time between glasses. Stop for 30 minutes to an hour & slowly start back drinking.   Call our office with any questions or concerns at [phone].   Thank You,   Christ Kick, [place]

## 2019-06-29 NOTE — Addendum Note (Signed)
Addended by: Metro Kung on: 06/29/2019 03:07 PM   Modules accepted: Orders, SmartSet

## 2019-07-25 ENCOUNTER — Emergency Department (HOSPITAL_COMMUNITY): Payer: PPO

## 2019-07-25 ENCOUNTER — Other Ambulatory Visit: Payer: Self-pay

## 2019-07-25 ENCOUNTER — Encounter (HOSPITAL_COMMUNITY): Payer: Self-pay | Admitting: Emergency Medicine

## 2019-07-25 ENCOUNTER — Inpatient Hospital Stay (HOSPITAL_COMMUNITY)
Admission: EM | Admit: 2019-07-25 | Discharge: 2019-07-29 | DRG: 551 | Disposition: A | Discharge status: Home / Self Care | Payer: PPO | Attending: Family Medicine | Admitting: Family Medicine

## 2019-07-25 DIAGNOSIS — M549 Dorsalgia, unspecified: Secondary | ICD-10-CM | POA: Diagnosis present

## 2019-07-25 DIAGNOSIS — M609 Myositis, unspecified: Secondary | ICD-10-CM | POA: Diagnosis not present

## 2019-07-25 DIAGNOSIS — N200 Calculus of kidney: Secondary | ICD-10-CM | POA: Diagnosis present

## 2019-07-25 DIAGNOSIS — R0902 Hypoxemia: Secondary | ICD-10-CM | POA: Diagnosis present

## 2019-07-25 DIAGNOSIS — M3320 Polymyositis, organ involvement unspecified: Secondary | ICD-10-CM | POA: Diagnosis not present

## 2019-07-25 DIAGNOSIS — M4800 Spinal stenosis, site unspecified: Secondary | ICD-10-CM | POA: Diagnosis not present

## 2019-07-25 DIAGNOSIS — F419 Anxiety disorder, unspecified: Secondary | ICD-10-CM | POA: Diagnosis present

## 2019-07-25 DIAGNOSIS — K5790 Diverticulosis of intestine, part unspecified, without perforation or abscess without bleeding: Secondary | ICD-10-CM | POA: Diagnosis present

## 2019-07-25 DIAGNOSIS — Z8349 Family history of other endocrine, nutritional and metabolic diseases: Secondary | ICD-10-CM | POA: Diagnosis not present

## 2019-07-25 DIAGNOSIS — R1013 Epigastric pain: Secondary | ICD-10-CM

## 2019-07-25 DIAGNOSIS — M5127 Other intervertebral disc displacement, lumbosacral region: Secondary | ICD-10-CM | POA: Diagnosis not present

## 2019-07-25 DIAGNOSIS — M48061 Spinal stenosis, lumbar region without neurogenic claudication: Principal | ICD-10-CM | POA: Diagnosis present

## 2019-07-25 DIAGNOSIS — Z7952 Long term (current) use of systemic steroids: Secondary | ICD-10-CM

## 2019-07-25 DIAGNOSIS — M5126 Other intervertebral disc displacement, lumbar region: Secondary | ICD-10-CM | POA: Diagnosis not present

## 2019-07-25 DIAGNOSIS — Z79899 Other long term (current) drug therapy: Secondary | ICD-10-CM

## 2019-07-25 DIAGNOSIS — M545 Low back pain, unspecified: Secondary | ICD-10-CM

## 2019-07-25 DIAGNOSIS — Z8249 Family history of ischemic heart disease and other diseases of the circulatory system: Secondary | ICD-10-CM

## 2019-07-25 DIAGNOSIS — R509 Fever, unspecified: Secondary | ICD-10-CM | POA: Diagnosis not present

## 2019-07-25 DIAGNOSIS — Z888 Allergy status to other drugs, medicaments and biological substances status: Secondary | ICD-10-CM

## 2019-07-25 DIAGNOSIS — K7689 Other specified diseases of liver: Secondary | ICD-10-CM | POA: Diagnosis not present

## 2019-07-25 DIAGNOSIS — R000 Tachycardia, unspecified: Secondary | ICD-10-CM | POA: Diagnosis not present

## 2019-07-25 DIAGNOSIS — R4182 Altered mental status, unspecified: Secondary | ICD-10-CM | POA: Diagnosis not present

## 2019-07-25 DIAGNOSIS — M479 Spondylosis, unspecified: Secondary | ICD-10-CM | POA: Diagnosis not present

## 2019-07-25 DIAGNOSIS — J189 Pneumonia, unspecified organism: Secondary | ICD-10-CM | POA: Diagnosis not present

## 2019-07-25 DIAGNOSIS — M4807 Spinal stenosis, lumbosacral region: Secondary | ICD-10-CM | POA: Diagnosis not present

## 2019-07-25 DIAGNOSIS — X500XXA Overexertion from strenuous movement or load, initial encounter: Secondary | ICD-10-CM | POA: Diagnosis not present

## 2019-07-25 DIAGNOSIS — R031 Nonspecific low blood-pressure reading: Secondary | ICD-10-CM | POA: Diagnosis not present

## 2019-07-25 DIAGNOSIS — M5440 Lumbago with sciatica, unspecified side: Secondary | ICD-10-CM | POA: Diagnosis not present

## 2019-07-25 DIAGNOSIS — E785 Hyperlipidemia, unspecified: Secondary | ICD-10-CM | POA: Diagnosis not present

## 2019-07-25 DIAGNOSIS — I739 Peripheral vascular disease, unspecified: Secondary | ICD-10-CM | POA: Diagnosis not present

## 2019-07-25 DIAGNOSIS — Z20828 Contact with and (suspected) exposure to other viral communicable diseases: Secondary | ICD-10-CM | POA: Diagnosis not present

## 2019-07-25 DIAGNOSIS — M1990 Unspecified osteoarthritis, unspecified site: Secondary | ICD-10-CM | POA: Diagnosis present

## 2019-07-25 DIAGNOSIS — D849 Immunodeficiency, unspecified: Secondary | ICD-10-CM | POA: Diagnosis present

## 2019-07-25 DIAGNOSIS — I700 Atherosclerosis of aorta: Secondary | ICD-10-CM | POA: Diagnosis not present

## 2019-07-25 DIAGNOSIS — E7800 Pure hypercholesterolemia, unspecified: Secondary | ICD-10-CM | POA: Diagnosis present

## 2019-07-25 DIAGNOSIS — M25569 Pain in unspecified knee: Secondary | ICD-10-CM | POA: Diagnosis present

## 2019-07-25 DIAGNOSIS — Z803 Family history of malignant neoplasm of breast: Secondary | ICD-10-CM | POA: Diagnosis not present

## 2019-07-25 DIAGNOSIS — R2689 Other abnormalities of gait and mobility: Secondary | ICD-10-CM | POA: Diagnosis not present

## 2019-07-25 LAB — COMPREHENSIVE METABOLIC PANEL
ALT: 68 U/L — ABNORMAL HIGH (ref 0–44)
AST: 63 U/L — ABNORMAL HIGH (ref 15–41)
Albumin: 4 g/dL (ref 3.5–5.0)
Alkaline Phosphatase: 64 U/L (ref 38–126)
Anion gap: 14 (ref 5–15)
BUN: 17 mg/dL (ref 8–23)
CO2: 25 mmol/L (ref 22–32)
Calcium: 8.8 mg/dL — ABNORMAL LOW (ref 8.9–10.3)
Chloride: 95 mmol/L — ABNORMAL LOW (ref 98–111)
Creatinine, Ser: 0.88 mg/dL (ref 0.61–1.24)
GFR calc Af Amer: >60 mL/min (ref >60)
GFR calc non Af Amer: >60 mL/min (ref >60)
Glucose, Bld: 141 mg/dL — ABNORMAL HIGH (ref 70–99)
Potassium: 3.9 mmol/L (ref 3.5–5.1)
Sodium: 134 mmol/L — ABNORMAL LOW (ref 135–145)
Total Bilirubin: 1 mg/dL (ref 0.3–1.2)
Total Protein: 7.4 g/dL (ref 6.5–8.1)

## 2019-07-25 LAB — CBC WITH DIFFERENTIAL/PLATELET
Abs Immature Granulocytes: 0.12 10*3/uL — ABNORMAL HIGH (ref 0.00–0.07)
Basophils Absolute: 0 10*3/uL (ref 0.0–0.1)
Basophils Relative: 0 %
Eosinophils Absolute: 0.1 10*3/uL (ref 0.0–0.5)
Eosinophils Relative: 1 %
HCT: 40.2 % (ref 39.0–52.0)
Hemoglobin: 12.7 g/dL — ABNORMAL LOW (ref 13.0–17.0)
Immature Granulocytes: 1 %
Lymphocytes Relative: 4 %
Lymphs Abs: 0.3 10*3/uL — ABNORMAL LOW (ref 0.7–4.0)
MCH: 30.5 pg (ref 26.0–34.0)
MCHC: 31.6 g/dL (ref 30.0–36.0)
MCV: 96.4 fL (ref 80.0–100.0)
Monocytes Absolute: 0.8 10*3/uL (ref 0.1–1.0)
Monocytes Relative: 9 %
Neutro Abs: 7.2 10*3/uL (ref 1.7–7.7)
Neutrophils Relative %: 85 %
Platelets: 156 10*3/uL (ref 150–400)
RBC: 4.17 MIL/uL — ABNORMAL LOW (ref 4.22–5.81)
RDW: 15.9 % — ABNORMAL HIGH (ref 11.5–15.5)
WBC: 8.6 10*3/uL (ref 4.0–10.5)
nRBC: 0 % (ref 0.0–0.2)

## 2019-07-25 LAB — CK: Total CK: 680 U/L — ABNORMAL HIGH (ref 49–397)

## 2019-07-25 LAB — LIPASE, BLOOD: Lipase: 32 U/L (ref 11–51)

## 2019-07-25 MED ORDER — ONDANSETRON HCL 4 MG/2ML IJ SOLN
4.0000 mg | Freq: Once | INTRAMUSCULAR | Status: AC
  Administered 2019-07-25: 4 mg via INTRAVENOUS
  Filled 2019-07-25: qty 2

## 2019-07-25 MED ORDER — SODIUM CHLORIDE 0.9 % IV BOLUS
1000.0000 mL | Freq: Once | INTRAVENOUS | Status: AC
  Administered 2019-07-25: 1000 mL via INTRAVENOUS

## 2019-07-25 MED ORDER — IOHEXOL 350 MG/ML SOLN
100.0000 mL | Freq: Once | INTRAVENOUS | Status: AC | PRN
  Administered 2019-07-25: 100 mL via INTRAVENOUS

## 2019-07-25 MED ORDER — FENTANYL CITRATE (PF) 100 MCG/2ML IJ SOLN
50.0000 ug | Freq: Once | INTRAMUSCULAR | Status: AC
  Administered 2019-07-26: 50 ug via INTRAVENOUS
  Filled 2019-07-25: qty 2

## 2019-07-25 MED ORDER — HYDROMORPHONE HCL 1 MG/ML IJ SOLN
1.0000 mg | Freq: Once | INTRAMUSCULAR | Status: AC
  Administered 2019-07-25: 22:00:00 1 mg via INTRAVENOUS
  Filled 2019-07-25: qty 1

## 2019-07-25 MED ORDER — SODIUM CHLORIDE 0.9% FLUSH
3.0000 mL | Freq: Once | INTRAVENOUS | Status: AC
  Administered 2019-07-25: 22:00:00 3 mL via INTRAVENOUS

## 2019-07-25 NOTE — ED Triage Notes (Signed)
Pt c/o severe generalized abd pain and lower back pain x3 days. Pt has not seen PCP for this. States pain just got worse tonight.

## 2019-07-25 NOTE — ED Notes (Signed)
From CT 

## 2019-07-25 NOTE — ED Notes (Signed)
To CT

## 2019-07-25 NOTE — ED Notes (Signed)
Spouse assisted pt to end of the stretcher Pt with near syncope  Wife screaming loudly for help  RNs x 3 in to assist Dr Laverta Baltimore  Pt moved to supine position on stretcher VS WNL Dr Laverta Baltimore FAST Second IV established   PT to CT

## 2019-07-26 ENCOUNTER — Inpatient Hospital Stay (HOSPITAL_COMMUNITY): Payer: PPO

## 2019-07-26 DIAGNOSIS — M48061 Spinal stenosis, lumbar region without neurogenic claudication: Secondary | ICD-10-CM | POA: Diagnosis present

## 2019-07-26 DIAGNOSIS — F419 Anxiety disorder, unspecified: Secondary | ICD-10-CM | POA: Diagnosis present

## 2019-07-26 DIAGNOSIS — M5440 Lumbago with sciatica, unspecified side: Secondary | ICD-10-CM

## 2019-07-26 DIAGNOSIS — M4800 Spinal stenosis, site unspecified: Secondary | ICD-10-CM | POA: Diagnosis present

## 2019-07-26 DIAGNOSIS — R031 Nonspecific low blood-pressure reading: Secondary | ICD-10-CM | POA: Diagnosis present

## 2019-07-26 DIAGNOSIS — J189 Pneumonia, unspecified organism: Secondary | ICD-10-CM | POA: Diagnosis present

## 2019-07-26 DIAGNOSIS — M3320 Polymyositis, organ involvement unspecified: Secondary | ICD-10-CM

## 2019-07-26 DIAGNOSIS — Z888 Allergy status to other drugs, medicaments and biological substances status: Secondary | ICD-10-CM | POA: Diagnosis not present

## 2019-07-26 DIAGNOSIS — D849 Immunodeficiency, unspecified: Secondary | ICD-10-CM | POA: Diagnosis present

## 2019-07-26 DIAGNOSIS — X500XXA Overexertion from strenuous movement or load, initial encounter: Secondary | ICD-10-CM | POA: Diagnosis not present

## 2019-07-26 DIAGNOSIS — K7689 Other specified diseases of liver: Secondary | ICD-10-CM | POA: Diagnosis present

## 2019-07-26 DIAGNOSIS — E7800 Pure hypercholesterolemia, unspecified: Secondary | ICD-10-CM | POA: Diagnosis present

## 2019-07-26 DIAGNOSIS — M5126 Other intervertebral disc displacement, lumbar region: Secondary | ICD-10-CM | POA: Diagnosis present

## 2019-07-26 DIAGNOSIS — Z79899 Other long term (current) drug therapy: Secondary | ICD-10-CM | POA: Diagnosis not present

## 2019-07-26 DIAGNOSIS — E785 Hyperlipidemia, unspecified: Secondary | ICD-10-CM | POA: Diagnosis present

## 2019-07-26 DIAGNOSIS — M549 Dorsalgia, unspecified: Secondary | ICD-10-CM | POA: Diagnosis present

## 2019-07-26 DIAGNOSIS — R509 Fever, unspecified: Secondary | ICD-10-CM

## 2019-07-26 DIAGNOSIS — Z20828 Contact with and (suspected) exposure to other viral communicable diseases: Secondary | ICD-10-CM | POA: Diagnosis present

## 2019-07-26 DIAGNOSIS — M1990 Unspecified osteoarthritis, unspecified site: Secondary | ICD-10-CM | POA: Diagnosis present

## 2019-07-26 DIAGNOSIS — Z803 Family history of malignant neoplasm of breast: Secondary | ICD-10-CM | POA: Diagnosis not present

## 2019-07-26 DIAGNOSIS — K5790 Diverticulosis of intestine, part unspecified, without perforation or abscess without bleeding: Secondary | ICD-10-CM | POA: Diagnosis present

## 2019-07-26 DIAGNOSIS — N200 Calculus of kidney: Secondary | ICD-10-CM | POA: Diagnosis present

## 2019-07-26 DIAGNOSIS — R0902 Hypoxemia: Secondary | ICD-10-CM | POA: Diagnosis present

## 2019-07-26 DIAGNOSIS — Z8249 Family history of ischemic heart disease and other diseases of the circulatory system: Secondary | ICD-10-CM | POA: Diagnosis not present

## 2019-07-26 DIAGNOSIS — M5127 Other intervertebral disc displacement, lumbosacral region: Secondary | ICD-10-CM | POA: Diagnosis present

## 2019-07-26 DIAGNOSIS — M25569 Pain in unspecified knee: Secondary | ICD-10-CM | POA: Diagnosis present

## 2019-07-26 DIAGNOSIS — Z8349 Family history of other endocrine, nutritional and metabolic diseases: Secondary | ICD-10-CM | POA: Diagnosis not present

## 2019-07-26 DIAGNOSIS — Z7952 Long term (current) use of systemic steroids: Secondary | ICD-10-CM | POA: Diagnosis not present

## 2019-07-26 DIAGNOSIS — M479 Spondylosis, unspecified: Secondary | ICD-10-CM | POA: Diagnosis present

## 2019-07-26 LAB — COMPREHENSIVE METABOLIC PANEL
ALT: 69 U/L — ABNORMAL HIGH (ref 0–44)
AST: 67 U/L — ABNORMAL HIGH (ref 15–41)
Albumin: 3.3 g/dL — ABNORMAL LOW (ref 3.5–5.0)
Alkaline Phosphatase: 53 U/L (ref 38–126)
Anion gap: 9 (ref 5–15)
BUN: 14 mg/dL (ref 8–23)
CO2: 23 mmol/L (ref 22–32)
Calcium: 7.9 mg/dL — ABNORMAL LOW (ref 8.9–10.3)
Chloride: 101 mmol/L (ref 98–111)
Creatinine, Ser: 0.8 mg/dL (ref 0.61–1.24)
GFR calc Af Amer: >60 mL/min (ref >60)
GFR calc non Af Amer: >60 mL/min (ref >60)
Glucose, Bld: 105 mg/dL — ABNORMAL HIGH (ref 70–99)
Potassium: 3.3 mmol/L — ABNORMAL LOW (ref 3.5–5.1)
Sodium: 133 mmol/L — ABNORMAL LOW (ref 135–145)
Total Bilirubin: 1.1 mg/dL (ref 0.3–1.2)
Total Protein: 6 g/dL — ABNORMAL LOW (ref 6.5–8.1)

## 2019-07-26 LAB — BLOOD GAS, ARTERIAL
Acid-base deficit: 1.2 mmol/L (ref 0.0–2.0)
Bicarbonate: 24.7 mmol/L (ref 20.0–28.0)
FIO2: 28
O2 Saturation: 96.6 %
Patient temperature: 37
pCO2 arterial: 23 mmHg — ABNORMAL LOW (ref 32.0–48.0)
pH, Arterial: 7.566 — ABNORMAL HIGH (ref 7.350–7.450)
pO2, Arterial: 74.6 mmHg — ABNORMAL LOW (ref 83.0–108.0)

## 2019-07-26 LAB — CBC
HCT: 34.2 % — ABNORMAL LOW (ref 39.0–52.0)
Hemoglobin: 11.2 g/dL — ABNORMAL LOW (ref 13.0–17.0)
MCH: 30.9 pg (ref 26.0–34.0)
MCHC: 32.7 g/dL (ref 30.0–36.0)
MCV: 94.2 fL (ref 80.0–100.0)
Platelets: 126 10*3/uL — ABNORMAL LOW (ref 150–400)
RBC: 3.63 MIL/uL — ABNORMAL LOW (ref 4.22–5.81)
RDW: 15.9 % — ABNORMAL HIGH (ref 11.5–15.5)
WBC: 9.3 10*3/uL (ref 4.0–10.5)
nRBC: 0 % (ref 0.0–0.2)

## 2019-07-26 LAB — URINALYSIS, ROUTINE W REFLEX MICROSCOPIC
Bilirubin Urine: NEGATIVE
Glucose, UA: NEGATIVE mg/dL
Hgb urine dipstick: NEGATIVE
Ketones, ur: 5 mg/dL — AB
Leukocytes,Ua: NEGATIVE
Nitrite: NEGATIVE
Protein, ur: NEGATIVE mg/dL
Specific Gravity, Urine: >1.046 — ABNORMAL HIGH (ref 1.005–1.030)
pH: 6 (ref 5.0–8.0)

## 2019-07-26 LAB — POC SARS CORONAVIRUS 2 AG -  ED: SARS Coronavirus 2 Ag: NEGATIVE

## 2019-07-26 LAB — CBG MONITORING, ED: Glucose-Capillary: 117 mg/dL — ABNORMAL HIGH (ref 70–99)

## 2019-07-26 LAB — SARS CORONAVIRUS 2 (TAT 6-24 HRS): SARS Coronavirus 2: NEGATIVE

## 2019-07-26 LAB — HIV ANTIBODY (ROUTINE TESTING W REFLEX): HIV Screen 4th Generation wRfx: NONREACTIVE

## 2019-07-26 MED ORDER — MORPHINE SULFATE ER 15 MG PO TBCR
15.0000 mg | EXTENDED_RELEASE_TABLET | Freq: Two times a day (BID) | ORAL | Status: DC
  Administered 2019-07-26 – 2019-07-29 (×6): 15 mg via ORAL
  Filled 2019-07-26 (×6): qty 1

## 2019-07-26 MED ORDER — HYDROMORPHONE HCL 1 MG/ML IJ SOLN
1.0000 mg | INTRAMUSCULAR | Status: DC | PRN
  Administered 2019-07-26 (×2): 1 mg via INTRAVENOUS
  Filled 2019-07-26 (×2): qty 1

## 2019-07-26 MED ORDER — CO Q 10 10 MG PO CAPS: 10.0000 mg | ORAL_CAPSULE | Freq: Every day | ORAL | Status: DC

## 2019-07-26 MED ORDER — HYDROCODONE-ACETAMINOPHEN 5-325 MG PO TABS: 1.0000 | ORAL_TABLET | Freq: Three times a day (TID) | ORAL | Status: DC

## 2019-07-26 MED ORDER — ALUM & MAG HYDROXIDE-SIMETH 200-200-20 MG/5ML PO SUSP
30.0000 mL | Freq: Once | ORAL | Status: AC
  Administered 2019-07-26: 30 mL via ORAL
  Filled 2019-07-26: qty 30

## 2019-07-26 MED ORDER — HYDROMORPHONE HCL 1 MG/ML IJ SOLN
1.0000 mg | Freq: Once | INTRAMUSCULAR | Status: AC
  Administered 2019-07-26: 03:00:00 1 mg via INTRAVENOUS
  Filled 2019-07-26: qty 1

## 2019-07-26 MED ORDER — SODIUM CHLORIDE 0.9 % IV BOLUS
1000.0000 mL | Freq: Once | INTRAVENOUS | Status: AC
  Administered 2019-07-26: 1000 mL via INTRAVENOUS

## 2019-07-26 MED ORDER — ACETAMINOPHEN 500 MG PO TABS
1000.0000 mg | ORAL_TABLET | Freq: Once | ORAL | Status: AC
  Administered 2019-07-26: 02:00:00 1000 mg via ORAL
  Filled 2019-07-26: qty 2

## 2019-07-26 MED ORDER — CALCIUM-VITAMIN D 500-200 MG-UNIT PO TABS
1.0000 | ORAL_TABLET | Freq: Every day | ORAL | Status: DC
  Filled 2019-07-26 (×5): qty 1

## 2019-07-26 MED ORDER — HYDROMORPHONE HCL 1 MG/ML IJ SOLN
0.5000 mg | INTRAMUSCULAR | Status: DC | PRN
  Administered 2019-07-26 – 2019-07-27 (×3): 0.5 mg via INTRAVENOUS
  Filled 2019-07-26 (×3): qty 0.5

## 2019-07-26 MED ORDER — HYDROCODONE-ACETAMINOPHEN 5-325 MG PO TABS: 1.0000 | ORAL_TABLET | Freq: Three times a day (TID) | ORAL | Status: DC | PRN

## 2019-07-26 MED ORDER — GABAPENTIN 300 MG PO CAPS
300.0000 mg | ORAL_CAPSULE | Freq: Every day | ORAL | Status: DC
  Administered 2019-07-26: 300 mg via ORAL
  Filled 2019-07-26: qty 1

## 2019-07-26 MED ORDER — IBUPROFEN 400 MG PO TABS: 600.0000 mg | ORAL_TABLET | Freq: Three times a day (TID) | ORAL | Status: DC | PRN

## 2019-07-26 MED ORDER — ASCORBIC ACID 500 MG PO TABS
1000.0000 mg | ORAL_TABLET | Freq: Every day | ORAL | Status: DC
  Administered 2019-07-26 – 2019-07-29 (×4): 1000 mg via ORAL
  Filled 2019-07-26 (×3): qty 2

## 2019-07-26 MED ORDER — LIDOCAINE 5 % EX PTCH
1.0000 | MEDICATED_PATCH | CUTANEOUS | Status: DC
  Administered 2019-07-26: 09:00:00 1 via TRANSDERMAL
  Filled 2019-07-26: qty 1

## 2019-07-26 MED ORDER — LORAZEPAM 2 MG/ML IJ SOLN
0.5000 mg | Freq: Once | INTRAMUSCULAR | Status: AC
  Administered 2019-07-26: 20:00:00 0.5 mg via INTRAVENOUS

## 2019-07-26 MED ORDER — FOLIC ACID 1 MG PO TABS
1.0000 mg | ORAL_TABLET | Freq: Every day | ORAL | Status: DC
  Administered 2019-07-26 – 2019-07-29 (×4): 1 mg via ORAL
  Filled 2019-07-26 (×4): qty 1

## 2019-07-26 MED ORDER — ACETAMINOPHEN 325 MG PO TABS: 650.0000 mg | ORAL_TABLET | Freq: Four times a day (QID) | ORAL | Status: DC | PRN

## 2019-07-26 MED ORDER — ENOXAPARIN SODIUM 60 MG/0.6ML ~~LOC~~ SOLN
50.0000 mg | SUBCUTANEOUS | Status: DC
  Administered 2019-07-28: 50 mg via SUBCUTANEOUS
  Filled 2019-07-26 (×2): qty 0.6

## 2019-07-26 MED ORDER — ENOXAPARIN SODIUM 40 MG/0.4ML ~~LOC~~ SOLN: 40.0000 mg | SUBCUTANEOUS | Status: DC

## 2019-07-26 MED ORDER — CYCLOBENZAPRINE HCL 10 MG PO TABS
10.0000 mg | ORAL_TABLET | Freq: Three times a day (TID) | ORAL | Status: DC
  Administered 2019-07-26 – 2019-07-29 (×10): 10 mg via ORAL
  Filled 2019-07-26 (×10): qty 1

## 2019-07-26 MED ORDER — HYDROCODONE-ACETAMINOPHEN 5-325 MG PO TABS
1.0000 | ORAL_TABLET | Freq: Three times a day (TID) | ORAL | Status: DC | PRN
  Administered 2019-07-26: 14:00:00 1 via ORAL
  Filled 2019-07-26: qty 1

## 2019-07-26 MED ORDER — IBUPROFEN 400 MG PO TABS: 600.0000 mg | ORAL_TABLET | Freq: Three times a day (TID) | ORAL | Status: DC

## 2019-07-26 MED ORDER — FLUOXETINE HCL 20 MG PO CAPS
20.0000 mg | ORAL_CAPSULE | Freq: Every day | ORAL | Status: DC
  Administered 2019-07-26 – 2019-07-29 (×4): 20 mg via ORAL
  Filled 2019-07-26 (×4): qty 1

## 2019-07-26 MED ORDER — SODIUM CHLORIDE 0.9 % IV SOLN
INTRAVENOUS | Status: DC
  Administered 2019-07-26: 75 mL/h via INTRAVENOUS
  Administered 2019-07-26: 06:00:00 via INTRAVENOUS

## 2019-07-26 MED ORDER — PANTOPRAZOLE SODIUM 40 MG IV SOLR
40.0000 mg | Freq: Once | INTRAVENOUS | Status: AC
  Administered 2019-07-26: 40 mg via INTRAVENOUS
  Filled 2019-07-26: qty 40

## 2019-07-26 MED ORDER — ONDANSETRON HCL 4 MG PO TABS: 4.0000 mg | ORAL_TABLET | Freq: Four times a day (QID) | ORAL | Status: DC | PRN

## 2019-07-26 MED ORDER — PREDNISONE 10 MG PO TABS
15.0000 mg | ORAL_TABLET | Freq: Every day | ORAL | Status: DC
  Administered 2019-07-26: 15 mg via ORAL
  Filled 2019-07-26: qty 2

## 2019-07-26 MED ORDER — OXYCODONE HCL 5 MG PO TABS
5.0000 mg | ORAL_TABLET | Freq: Four times a day (QID) | ORAL | Status: DC | PRN
  Administered 2019-07-26: 5 mg via ORAL
  Filled 2019-07-26: qty 1

## 2019-07-26 MED ORDER — ONDANSETRON HCL 4 MG/2ML IJ SOLN
4.0000 mg | Freq: Four times a day (QID) | INTRAMUSCULAR | Status: DC | PRN
  Administered 2019-07-27: 4 mg via INTRAVENOUS
  Filled 2019-07-26: qty 2

## 2019-07-26 MED ORDER — ACETAMINOPHEN 650 MG RE SUPP: 650.0000 mg | Freq: Four times a day (QID) | RECTAL | Status: DC | PRN

## 2019-07-26 MED ORDER — LORAZEPAM 2 MG/ML IJ SOLN
INTRAMUSCULAR | Status: AC
  Filled 2019-07-26: qty 1

## 2019-07-26 MED ORDER — KETOROLAC TROMETHAMINE 30 MG/ML IJ SOLN
30.0000 mg | Freq: Once | INTRAMUSCULAR | Status: AC
  Administered 2019-07-26: 30 mg via INTRAVENOUS
  Filled 2019-07-26: qty 1

## 2019-07-26 NOTE — ED Notes (Signed)
PT at bedside.

## 2019-07-26 NOTE — ED Provider Notes (Signed)
[facility] EMERGENCY DEPARTMENT Provider Note   CSN: [id number] Arrival date & time: 07/25/19  1928     History Chief Complaint  Patient presents with   Abdominal Pain   Back Pain    Jacob Hunter is a 63 y.o. male.  The history is provided by the patient and the spouse.  Abdominal Pain Pain location:  Generalized Pain quality: sharp and stabbing   Pain radiates to:  L flank and R flank Pain severity:  Severe Onset quality:  Gradual Duration:  3 days Timing:  Constant Progression:  Worsening Chronicity:  New Context: not alcohol use, not eating and not recent illness   Associated symptoms: anorexia, belching and nausea   Associated symptoms: no chest pain, no chills, no constipation, no cough, no diarrhea, no dysuria, no fatigue, no fever, no shortness of breath and no vomiting   Associated symptoms comment:  Increased belching without relief of sx. Denies acid reflux. No etoh use.  Risk factors: no alcohol abuse and has not had multiple surgeries   Back Pain Associated symptoms: abdominal pain   Associated symptoms: no chest pain, no dysuria and no fever        Past Medical History:  Diagnosis Date   Arthritis    Hypercholesteremia    Knee pain    Necrotizing myopathy    Polymyositis ([place])     Patient Active Problem List   Diagnosis Date Noted   Pain in left knee 07/21/2018   Anxiety and depression 12/30/2015   Impaired mobility and activities of daily living 12/30/2015   Necrotizing myopathy 06/16/2015   Bilateral leg weakness 01/24/2015   Elevated CPK 01/24/2015   Transaminitis 10/24/2014    Past Surgical History:  Procedure Laterality Date   ANKLE SURGERY Left    KNEE SURGERY Left        Family History  Problem Relation Age of Onset   Hypertension Father    Hypercholesterolemia Father    Hypercholesterolemia Sister    Hypertension Mother    Breast cancer Sister    Colon cancer Neg Hx    Liver disease Neg Hx       Social History   Tobacco Use   Smoking status: Never Smoker   Smokeless tobacco: Never Used  Substance Use Topics   Alcohol use: No    Alcohol/week: 0.0 standard drinks    Comment: Last ETOH was 1-2 drinks in 1990.   Drug use: No    Home Medications Prior to Admission medications   Medication Sig Start Date End Date Taking? Authorizing Provider  ascorbic acid (VITAMIN C) 500 MG tablet Take 1,000 mg by mouth daily.     [provider]  calcium-vitamin D (OSCAL WITH D) 500-200 MG-UNIT tablet Take 1 tablet by mouth daily.    [provider]  Coenzyme Q10 (CO Q 10) 10 MG CAPS Take 10 mg by mouth daily.    [provider]  FLUoxetine (PROZAC) 20 MG capsule Take 20 mg by mouth daily.    [provider]  folic acid (FOLVITE) 1 MG tablet Take 1 mg by mouth daily.    [provider]  methotrexate (RHEUMATREX) 2.5 MG tablet Take 25 mg by mouth once a week. Caution:Chemotherapy. Protect from light.(monday) 10 tabs total    [provider]  methylPREDNISolone sodium succinate (SOLU-MEDROL) 1000 MG injection Inject 1,000 mg into the vein once.    [provider]  predniSONE (DELTASONE) 10 MG tablet Take 15 mg by mouth daily  with breakfast.     [provider]  riTUXimab (RITUXAN) 100 MG/10ML injection Inject into the vein.    [provider]    Allergies    Methylprednisolone, Other, and Statins  Review of Systems   Review of Systems  Constitutional: Negative for chills, fatigue and fever.  Respiratory: Negative for cough and shortness of breath.   Cardiovascular: Negative for chest pain.  Gastrointestinal: Positive for abdominal pain, anorexia and nausea. Negative for constipation, diarrhea and vomiting.  Genitourinary: Negative for dysuria.  Musculoskeletal: Positive for back pain.    Physical Exam Updated Vital Signs BP (!) 160/85    Pulse 98    Temp 98.6 F (37 C) (Oral)    Resp (!) 21    Ht 6'  1" (1.854 m)    Wt 106.1 kg    SpO2 95%    BMI 30.87 kg/m   Physical Exam Vitals and nursing note reviewed.  Constitutional:      Appearance: Jacob Hunter is well-developed.  HENT:     Head: Normocephalic and atraumatic.  Eyes:     Conjunctiva/sclera: Conjunctivae normal.  Cardiovascular:     Rate and Rhythm: Normal rate and regular rhythm.     Heart sounds: Normal heart sounds.  Pulmonary:     Effort: Pulmonary effort is normal.     Breath sounds: Normal breath sounds. No wheezing.  Abdominal:     General: Abdomen is protuberant. Bowel sounds are normal. There is no abdominal bruit.     Palpations: Abdomen is soft. There is no hepatomegaly, splenomegaly, mass or pulsatile mass.     Tenderness: There is abdominal tenderness in the epigastric area and periumbilical area. There is no right CVA tenderness, left CVA tenderness, guarding or rebound. Negative signs include Murphy's sign.  Musculoskeletal:        General: Normal range of motion.     Cervical back: Normal range of motion.  Skin:    General: Skin is warm and dry.  Neurological:     General: No focal deficit present.     Mental Status: Jacob Hunter is alert.     ED Results / Procedures / Treatments   Labs (all labs ordered are listed, but only abnormal results are displayed) Labs Reviewed  COMPREHENSIVE METABOLIC PANEL - Abnormal; Notable for the following components:      Result Value   Sodium 134 (*)    Chloride 95 (*)    Glucose, Bld 141 (*)    Calcium 8.8 (*)    AST 63 (*)    ALT 68 (*)    All other components within normal limits  URINALYSIS, ROUTINE W REFLEX MICROSCOPIC - Abnormal; Notable for the following components:   Specific Gravity, Urine >1.046 (*)    Ketones, ur 5 (*)    All other components within normal limits  CBC WITH DIFFERENTIAL/PLATELET - Abnormal; Notable for the following components:   RBC 4.17 (*)    Hemoglobin 12.7 (*)    RDW 15.9 (*)    Lymphs Abs 0.3 (*)    Abs Immature Granulocytes 0.12 (*)    All  other components within normal limits  CK - Abnormal; Notable for the following components:   Total CK 680 (*)    All other components within normal limits  CBG MONITORING, ED - Abnormal; Notable for the following components:   Glucose-Capillary 117 (*)    All other components within normal limits  LIPASE, BLOOD    EKG EKG Interpretation  Date/Time:  Sunday  July 25 2019 21:31:55 EST Ventricular Rate:  108 PR Interval:    QRS Duration: 103 QT Interval:  333 QTC Calculation: 447 R Axis:   64 Text Interpretation: Sinus tachycardia Since last tracing rate faster Confirmed by Noemi Chapel [phone]) on 07/26/2019 12:33:40 AM   Radiology CT Angio Chest/Abd/Pel for Dissection W and/or Wo Contrast  Result Date: 07/25/2019 CLINICAL DATA:  Abdominal pain and low back pain for 3 days EXAM: CT ANGIOGRAPHY CHEST, ABDOMEN AND PELVIS TECHNIQUE: Multidetector CT imaging through the chest, abdomen and pelvis was performed using the standard protocol during bolus administration of intravenous contrast. Multiplanar reconstructed images and MIPs were obtained and reviewed to evaluate the vascular anatomy. CONTRAST:  167mL OMNIPAQUE IOHEXOL 350 MG/ML SOLN COMPARISON:  11/18/2014 FINDINGS: CTA CHEST FINDINGS Cardiovascular: Initial precontrast images show no hyperdense crescent to suggest intramural hematoma. The thoracic aorta demonstrates mild atherosclerotic calcifications. No cardiac enlargement is seen. No coronary calcifications are noted. The descending thoracic aorta appears within normal limits. The pulmonary artery shows a normal branching pattern without filling defect to suggest pulmonary embolism. Mediastinum/Nodes: The thoracic inlet is within normal limits. No sizable hilar or mediastinal adenopathy is noted. The esophagus as visualized is within normal limits. Lungs/Pleura: The lungs are well aerated bilaterally. No focal infiltrate, effusion or pneumothorax is seen. No parenchymal nodules.  Musculoskeletal: No acute bony abnormality is seen. Review of the MIP images confirms the above findings. CTA ABDOMEN AND PELVIS FINDINGS VASCULAR Aorta: Abdominal aorta shows no aneurysmal dilatation or evidence of dissection. Minimal atherosclerotic calcifications are noted. Celiac: Patent without evidence of aneurysm, dissection, vasculitis or significant stenosis. SMA: Patent without evidence of aneurysm, dissection, vasculitis or significant stenosis. Renals: Both renal arteries are patent without evidence of aneurysm, dissection, vasculitis, fibromuscular dysplasia or significant stenosis. IMA: Patent without evidence of aneurysm, dissection, vasculitis or significant stenosis. Iliacs: Within normal limits. Veins: No specific venous abnormality is noted. Review of the MIP images confirms the above findings. NON-VASCULAR Hepatobiliary: Scattered small cysts are noted throughout the liver stable from prior exam. The gallbladder is within normal limits. Pancreas: Unremarkable. No pancreatic ductal dilatation or surrounding inflammatory changes. Spleen: Normal in size without focal abnormality. Adrenals/Urinary Tract: Adrenal glands are within normal limits. Kidneys are well visualized bilaterally. A few tiny nonobstructing right renal stones are noted. The ureters are within normal limits. The bladder is partially distended. Stomach/Bowel: Scattered mild diverticular change is seen. No evidence of diverticulitis is noted. Stomach and small bowel are within normal limits. The appendix is within normal limits Lymphatic: No significant lymphadenopathy is noted. Reproductive: Prostate is unremarkable. Other: No abdominal wall hernia or abnormality. No abdominopelvic ascites. Musculoskeletal: No acute or significant osseous findings. Review of the MIP images confirms the above findings. IMPRESSION: CTA of the chest: No aneurysmal dilatation or dissection of the aorta is noted. The pulmonary artery as visualized is  within normal limits. No other focal abnormality is noted. CTA of the abdomen and pelvis: Normal-appearing abdominal aorta. Nonobstructing right renal stones. Diverticular disease without diverticulitis. Stable small hepatic cysts. Electronically Signed   By: Inez Catalina M.D.   On: 07/25/2019 22:17    Procedures Procedures (including critical care time)  Medications Ordered in ED Medications  HYDROmorphone (DILAUDID) injection 1 mg (has no administration in time range)  sodium chloride 0.9 % bolus 1,000 mL (has no administration in time range)  pantoprazole (PROTONIX) injection 40 mg (has no administration in time range)  alum & mag hydroxide-simeth (MAALOX/MYLANTA) 200-200-20 MG/5ML suspension 30 mL (has  no administration in time range)  sodium chloride flush (NS) 0.9 % injection 3 mL (3 mLs Intravenous Given 07/25/19 2134)  sodium chloride 0.9 % bolus 1,000 mL (1,000 mLs Intravenous New Bag/Given 07/25/19 2134)  ondansetron (ZOFRAN) injection 4 mg (4 mg Intravenous Given 07/25/19 2138)  HYDROmorphone (DILAUDID) injection 1 mg (1 mg Intravenous Given 07/25/19 2140)  iohexol (OMNIPAQUE) 350 MG/ML injection 100 mL (100 mLs Intravenous Contrast Given 07/25/19 2145)  fentaNYL (SUBLIMAZE) injection 50 mcg (50 mcg Intravenous Given 07/26/19 0003)    ED Course  I have reviewed the triage vital signs and the nursing notes.  Pertinent labs & imaging results that were available during my care of the patient were reviewed by me and considered in my medical decision making (see chart for details).  Clinical Course as of Jul 25 140  Mon Jul 26, 2019  0053 CT Angio Chest/Abd/Pel for Dissection W and/or Wo Contrast [JI]    Clinical Course User Index [JI] Evalee Jefferson, PA-C   MDM Rules/Calculators/A&P                      Pt with abdominal and bilateral back pain, intermittently for the past 3 days. Several re-exams of abd during visit - no guarding or acute findings, abd protuberant but not  tense, no acute abd findings. His sx intermittently migrate between his abd to his back. During one brief recheck, his pain was in his back only, prior to leaving the room, it migrated to the abdomen, no longer felt in the back. No cp, no sob, labs stable, chronically has an elevated CK with his necrotizing myopathy condition, actually low today in comparison with prior labs here and at [facility].  Lft's also slightly elevated, again a chronic finding, lipase normal and normal appearing gallbladder on Ct imaging.  No dissection.  Normal bowel gas pattern, no sign of obstruction.  Pain of unclear etiology but no abd pathology identified.   Pt obtained some improvement with dilaudid, but not complete relief. Fentanyl not helpful. Added protonix, additional IV fluids ordered.  2:07 AM RN notified that at recheck of vitals, pt felt warm.  Checked rectal temp and 103.1  Tylenol given, will plan admission for further evaluation.  Rapid covid test collected.    Discussed with Dr. Darrick Meigs who requested call back once Covid resulted.   Signed out to Dr. Sabra Heck.    Maalox given with no relief of pain sx.   Final Clinical Impression(s) / ED Diagnoses Final diagnoses:  None    Rx / DC Orders ED Discharge Orders    None       Landis Martins 07/26/19 0241    Margette Fast, MD 07/28/19 [phone]

## 2019-07-26 NOTE — ED Notes (Signed)
Per orders, gave patient 1 mg of Dilaudid. Pt is now sleeping. O2 sats dropped to 82% with 2L  applied.

## 2019-07-26 NOTE — Plan of Care (Signed)
  Problem: Acute Rehab PT Goals(only PT should resolve) Goal: Pt Will Go Supine/Side To Sit Outcome: Progressing Flowsheets (Taken 07/26/2019 1514) Pt will go Supine/Side to Sit: with supervision Goal: Patient Will Perform Sitting Balance Outcome: Progressing Flowsheets (Taken 07/26/2019 1514) Patient will perform sitting balance: with supervision Goal: Patient Will Transfer Sit To/From Stand Outcome: Progressing Flowsheets (Taken 07/26/2019 1514) Patient will transfer sit to/from stand: with supervision Goal: Pt Will Ambulate Outcome: Progressing Flowsheets (Taken 07/26/2019 1514) Pt will Ambulate:  50 feet  with supervision  with rolling walker   3:15 PM, 07/26/19 Lonell Grandchild, MPT Physical Therapist with [facility] [phone] office [phone] mobile phone

## 2019-07-26 NOTE — ED Notes (Signed)
Pt is now switched back over to 3L [place]. Breathing WDL. Sleeping with equal rise and chest fall

## 2019-07-26 NOTE — Progress Notes (Signed)
PROGRESS NOTE    Patient: Jacob Hunter                            PCP: Asencion Noble, MD                    DOB: 09-22-1955            DOA: 07/25/2019 CHY:[id number]             DOS: 07/26/2019, 11:47 AM   LOS: 0 days   Date of Service: The patient was seen and examined on 07/26/2019  Subjective:   The patient was seen and examined this morning, very uncomfortable cannot lay flat.  Cannot find a position that he is pain-free or comfortable. Denies having any numbness or tingling or weakness in his legs denies have any radiation of the pain to his buttocks or lower leg area.  Denies of having any bowel or bladder incontinency. He does not look toxic --- remain awake alert oriented Currently afebrile, normotensive Wife present at bedside -findings were discussed with the patient and his wife in detail.   Brief Narrative:   63 y.o. male, with history of necrotizing myopathy, on chronic prednisone 15 mg daily, 1 g Solu-Medrol every 4 weeks, Rituxan every 6 months, polymyositis, hyperlipidemia who came to hospital with complaints of abdominal pain and back pain for past 3 days.   As per patient wife this started after patient lifted up a heavy dog food bag.  Patient said that the pain has been persistent, get worse on movement.  It is not associated with nausea vomiting or diarrhea.  Denies chest pain or shortness of breath.  He does have history of spinal stenosis.  MRI lumbar spine from 2016 showed moderate spinal stenosis L3-L4, L4-5 central disc protrusion with moderate spinal stenosis, small central disc protrusion at L5-S1 without neural impingement. He denies fever or chills.    ED he had a temperature of 103.1.  He was hypoxic.  Initial Covid antigen test was negative. SARS-CoV-2 RT-PCR 6-24 is currently pending. CT angio chest abdomen and pelvis was obtained in the ED which is unremarkable.  07/26/2019:still complaining of intractable back pain, unable to get comfortable on lay  back flat,  not associated with paresthesia or weakness Pending MRI of the back  Assessment & Plan:   Active Problems:   Back pain  Intractable back pain -Patient is very uncomfortable unable to lay flat, -Denies having any paresthesia, radiation of pain to buttocks or lower extremities -History of a spinal stenosis at L3-4, L5-S1, with disc protrusion -Continue IV, PO as needed analgesics, muscle relaxant, Lidoderm patch, -PT/OT consulted for evaluation recommendation -MRI of spine was reviewed: Moderate central canal stenosis at L2-3 due to a disc bulge and ligamentum flavum thickening is increased compared to the prior MRI. Otherwise no other acute changes  Will obtain neurosurgery consult for further evaluation Dr.pool called   Fever, mild hypoxia -Upon arrival one reading of temp 100.1, PT temp 99, currently 98 -- No Leukocytosis -SARS-CoV-2 negative -Chest x-ray UA negative -CTA chest/abdomen -no for PE, or aortic aneurysm  -would hold antibiotics at this time -monitor closely for any signs of infection  Polymyositis/necrotizing myopathy -patient has chronically elevated CPK.  Continue prednisone 15 mg daily,  - patient got Solu-Medrol 1000 mg every 4 weeks, Rituxan 375 mg/m weekly x4 every 6 months, methotrexate 25 mg weekly.  -we will continue with prednisone 15  mg daily.   - CPK is 600, review of the records from [facility] showed that patient's CPK is chronically elevated in 400s.   Cultures;  Antimicrobials:  DVT prophylaxis: SCD/Compression stockings and Lovenox SQ Code Status:   Code Status: Full Code Family Communication: Wife present at bedside, updated. The above findings and plan of care has been discussed with patient and his wife in detail,  they expressed understanding and agreement of above. Disposition Plan:   Anticipated 1-2 days Admission status:  NPATIEN -   Consultants: None    CTA chest/abdomen:  IMPRESSION: CTA of the chest: No  aneurysmal dilatation or dissection of the aorta is noted. The pulmonary artery as visualized is within normal limits. No other focal abnormality is noted. CTA of the abdomen and pelvis: Normal-appearing abdominal aorta. Nonobstructing right renal stones. Diverticular disease without diverticulitis. Stable small hepatic cysts.   MRI lumbar spine:  IMPRESSION: 1. No change in the appearance of the lumbar spine since the prior MRI. 2. Moderate central canal stenosis at L2-3 due to a disc bulge and ligamentum flavum thickening is increased compared to the prior MRI. 3. No change in moderate central canal stenosis and mild to moderate foraminal narrowing at L3-4. 4. No change in moderate central canal and left worse than right subarticular recess narrowing at L4-5. 5. No change in a shallow broad-based right paracentral protrusion at L5-S1 causes mild narrowing in the subarticular recesses.    Procedures:   No admission procedures for hospital encounter.     Antimicrobials:  Anti-infectives (From admission, onward)   None       Medication:   calcium-vitamin D  1 tablet Oral Daily   cyclobenzaprine  10 mg Oral TID   [START ON 07/27/2019] enoxaparin (LOVENOX) injection  50 mg Subcutaneous Q24H   FLUoxetine  20 mg Oral Daily   folic acid  1 mg Oral Daily   lidocaine  1 patch Transdermal Q24H   predniSONE  15 mg Oral Q breakfast   ascorbic acid  1,000 mg Oral Daily    acetaminophen **OR** acetaminophen, HYDROcodone-acetaminophen, HYDROmorphone (DILAUDID) injection, ibuprofen, ondansetron **OR** ondansetron (ZOFRAN) IV   Objective:   Vitals:   07/26/19 1000 07/26/19 1030 07/26/19 1100 07/26/19 1124  BP: (!) 154/80 139/76 139/83   Pulse:  99 99   Resp: 17 19 20    Temp:    98 F (36.7 C)  TempSrc:    Oral  SpO2:  94% 96%   Weight:      Height:       No intake or output data in the 24 hours ending 07/26/19 1147 Filed Weights   07/25/19 1941    Weight: 106.1 kg     Examination:   Physical Exam  Constitution:  Alert, cooperative, in moderate distress complaining of back pain, uncomfortable laying in bed Psychiatric: Normal and stable mood and affect, cognition intact,   HEENT: Normocephalic, PERRL, otherwise with in Normal limits  Chest:Chest symmetric Cardio vascular:  S1/S2, RRR, No murmure, No Rubs or Gallops  pulmonary: Clear to auscultation bilaterally, respirations unlabored, negative wheezes / crackles Abdomen: Soft, non-tender, non-distended, bowel sounds,no masses, no organomegaly Muscular skeletal: Limited exam - in bed, able to move all 4 extremities, Normal strength,  Neuro: CNII-XII intact. , normal motor and sensation, reflexes intact  Extremities: No pitting edema lower extremities, +2 pulses  Skin: Dry, warm to touch, negative for any Rashes, No open wounds Wounds: per nursing documentation  LABs:  CBC Latest Ref Rng & Units 07/26/2019 07/25/2019 10/14/2015  WBC 4.0 - 10.5 K/uL 9.3 8.6 -  Hemoglobin 13.0 - 17.0 g/dL 11.2(L) 12.7(L) 14.3  Hematocrit 39.0 - 52.0 % 34.2(L) 40.2 42.0  Platelets 150 - 400 K/uL 126(L) 156 -   CMP Latest Ref Rng & Units 07/26/2019 07/25/2019 10/14/2015  Glucose 70 - 99 mg/dL 105(H) 141(H) 95  BUN 8 - 23 mg/dL 14 17 20   Creatinine 0.61 - 1.24 mg/dL 0.80 0.88 0.80  Sodium 135 - 145 mmol/L 133(L) 134(L) 141  Potassium 3.5 - 5.1 mmol/L 3.3(L) 3.9 4.5  Chloride 98 - 111 mmol/L 101 95(L) 101  CO2 22 - 32 mmol/L 23 25 -  Calcium 8.9 - 10.3 mg/dL 7.9(L) 8.8(L) -  Total Protein 6.5 - 8.1 g/dL 6.0(L) 7.4 -  Total Bilirubin 0.3 - 1.2 mg/dL 1.1 1.0 -  Alkaline Phos 38 - 126 U/L 53 64 -  AST 15 - 41 U/L 67(H) 63(H) -  ALT 0 - 44 U/L 69(H) 68(H) -        SIGNED: Deatra James, MD, FACP, FHM. [facility],  Pager [phone]  If 7PM-7AM, please contact night-coverage [email or web address] [email or web address] [facility] 07/26/2019, 11:47 AM

## 2019-07-26 NOTE — ED Notes (Signed)
Pt to MRI

## 2019-07-26 NOTE — ED Notes (Signed)
Pt and family updated on the MD wanting to hold off on taking Methotrexate d/t r/o infection, message sent re: pts increased pain level

## 2019-07-26 NOTE — Progress Notes (Signed)
Patient c/o excruciating pain to mid/lower back, rated 9/10 on 0-10 pain scale. Reports pain is constant. BP elevated, respirations 26, O2 sats 98-100% on 2 lpm. Patient agitated, attempting to get out of bed, hyperventilating in pain. Patient did not tolerate dilaudid well in ER per report. Notified Dr. Jamesetta Geralds. Orders received to change dilaudid to 0.5 mg IV every 4 hours PRN severe pain and add oxy IR 5 mg po every 6 hours PRN moderate pain. D/c Norco and lidoderm patch. lidoderm patch was not present on admission assessment. Discussed with patient and wife at bedside. Patient agreeable to try dilaudid 0.5 mg dose. Given once as ordered. Patient assisted to lie down, wife remains at bedside. O2 sats dropped to 86% on 2 lpm with periods of apnea. Patient encouraged to take deep breaths. O2 sats increased to 96% on 2 lpm. Notified Dr. Darrick Meigs. Orders received for telemetry and continuous pulse oximetry. Donavan Foil, RN

## 2019-07-26 NOTE — TOC Initial Note (Signed)
Transition of Care [facility]) - Initial/Assessment Note    Patient Details  Name: Jacob Hunter MRN: [id number] Date of Birth: 1956-04-07  [facility] CM/SW Contact:    Ardoch, LCSW Phone Number: 07/26/2019, 3:24 PM  Clinical Narrative:   CSW conducted [facility] assessment telephonically with pts Wife Jacob Hunter, after receiving verbal permission from patient.         Jacob Hunter reports that patient lives at home with spouse and adult aged daughter. Jacob Hunter goes into detail and explains that although pt is disabled, that he is able to perform all ADLs independently.   Jacob Hunter expresses that patient is able to perform tasks such as toileting, bathing and dressing independently but becomes easily fatigued by exercises that require long distance walking.   Family reports that patient has received home health in the past but is unable to recall which year, either 2018 or 2019.   Pt does not require any DME use in the home.   CSW informed family of PT recommendation of SNF. Family states that they would think of this and follow up as soon as they could. Family expresses that they are shocked that patient requires that much care as patient walked into the ED. Pt is noted to be experiencing much pain during TOC assessment as evidence by moaning in the background and Jacob Hunter explaining that patient can not speak for himself at the moment due to him being in so much pain.   CSW will continue to follow patient for discharge related needs.  [facility]  Clinical Social Worker  Ph: [phone]  Expected Discharge Plan: Skilled Nursing Facility Barriers to Discharge: Continued Medical Work up, SNF Pending bed offer   Patient Goals and CMS Choice Patient states their goals for this hospitalization and ongoing recovery are:: family explains that their goal is for patient to receive treatment for pain      Expected Discharge Plan and Services Expected  Discharge Plan: [place] In-house Referral: Clinical Social Work Discharge Planning Services: CM Consult   Living arrangements for the past 2 months: Single Family Home                                      Prior Living Arrangements/Services Living arrangements for the past 2 months: Single Family Home Lives with:: Spouse, Adult Children Patient language and need for interpreter reviewed:: Yes Do you feel safe going back to the place where you live?: Yes      Need for Family Participation in Patient Care: No (Comment) Care giver support system in place?: Yes (comment)   Criminal Activity/Legal Involvement Pertinent to Current Situation/Hospitalization: No - Comment as needed  Activities of Daily Living Home Assistive Devices/Equipment: None ADL Screening (condition at time of admission) Patient's cognitive ability adequate to safely complete daily activities?: Yes Is the patient deaf or have difficulty hearing?: No Does the patient have difficulty seeing, even when wearing glasses/contacts?: No Does the patient have difficulty concentrating, remembering, or making decisions?: No Patient able to express need for assistance with ADLs?: Yes Does the patient have difficulty dressing or bathing?: No Independently performs ADLs?: Yes (appropriate for developmental age) Does the patient have difficulty walking or climbing stairs?: No Weakness of Legs: None Weakness of Arms/Hands: None  Permission Sought/Granted Permission sought to share information with : Case Manager, Customer service manager Permission granted to share  information with : Yes, Verbal Permission Granted  Share Information with NAME: Jacob Hunter     Permission granted to share info w Relationship: Spouse  Permission granted to share info w Contact Information: [phone]  Emotional Assessment Appearance:: Appears stated age Attitude/Demeanor/Rapport: Engaged Affect (typically  observed): Unable to Assess(Patient is in pain during time of TOC assessment and wife assists with completing assessment) Orientation: : Oriented to Self, Oriented to Place, Oriented to  Time, Oriented to Situation Alcohol / Substance Use: Not Applicable Psych Involvement: No (comment)  Admission diagnosis:  Back pain [M54.9] Patient Active Problem List   Diagnosis Date Noted  . Back pain 07/26/2019  . Spinal stenosis 07/26/2019  . Polymyositis ([place]) 07/26/2019  . Pain in left knee 07/21/2018  . Anxiety and depression 12/30/2015  . Impaired mobility and activities of daily living 12/30/2015  . 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 Bilateral leg weakness 01/24/2015  . Elevated CPK 01/24/2015  . Transaminitis 10/24/2014   PCP:  Asencion Noble, MD Pharmacy:   [id number] - [place], [address] [place] [address] Phone: [phone] Fax: [phone]     Social Determinants of Health (SDOH) Interventions    Readmission Risk Interventions No flowsheet data found.

## 2019-07-26 NOTE — Progress Notes (Signed)
Rapid response called on patient shortly after report given.  Patient is hyperventilating, to the point of unconsciouness/syncopal episodes.  2 ICU RN response, RT, and MD.  Patient vitals were stable. But patient had a period of confusion, and hallucinations.  Patient was given toradol, and ativan per MD order.  Patient was on 2 L and was increased to 4L.    Blood pressure stable, and ABG done due to rapid response protocol. Patient rested comfortablly for approximately 45 minutes, then stated his pain was still an 8.  Wife is at bedside.   Results relayed to MD.

## 2019-07-26 NOTE — ED Notes (Signed)
Have placed pt on 5 lead as well as non rebreather after administration of dilaudid. Pt is breathing 19 times per minute. O2 sats 98% on Non rebreather. Pt is arousable to stimuli.

## 2019-07-26 NOTE — Evaluation (Addendum)
Physical Therapy Evaluation Patient Details Name: Jacob Hunter MRN: [id number] DOB: 1955-10-12 Today's Date: 07/26/2019   History of Present Illness  Jacob Hunter  is a 63 y.o. male, with history of necrotizing myopathy, on chronic prednisone 15 mg daily, 1 g Solu-Medrol every 4 weeks, Rituxan every 6 months, polymyositis, hyperlipidemia who came to hospital with complaints of abdominal pain and back pain for past 3 days.  As per patient wife this started after patient lifted up a heavy dog food bag.  Patient said that the pain has been persistent, get worse on movement.  It is not associated with nausea vomiting or diarrhea.  Denies chest pain or shortness of breath.  He does have history of spinal stenosis.  MRI lumbar spine from 2016 showed moderate spinal stenosis L3-L4, L4-5 central disc protrusion with moderate spinal stenosis, small central disc protrusion at L5-S1 without neural impingement.    Clinical Impression  Patient presents very lethargy, possibly due to pain medication per patient's spouse.  Patient became more lethargic with near syncopal episode and unable to attempt out of bed activities - RN notified.  Patient required Min/mod assist to reposition in bed.  Patient will benefit from continued physical therapy in hospital and recommended venue below to increase strength, balance, endurance for safe ADLs and gait.    Follow Up Recommendations SNF;Supervision - Intermittent;Supervision for mobility/OOB    Equipment Recommendations  None recommended by PT    Recommendations for Other Services       Precautions / Restrictions Precautions Precautions: Fall Restrictions Weight Bearing Restrictions: No      Mobility  Bed Mobility Overal bed mobility: Needs Assistance Bed Mobility: Rolling;Sidelying to Sit;Sit to Sidelying Rolling: Min assist Sidelying to sit: Min assist     Sit to sidelying: Min assist;Mod assist General bed mobility comments: slow labored  movement with fair carryover for log rolling  Transfers                    Ambulation/Gait                Stairs            Wheelchair Mobility    Modified Rankin (Stroke Patients Only)       Balance Overall balance assessment: Needs assistance Sitting-balance support: Bilateral upper extremity supported;Feet supported Sitting balance-Leahy Scale: Poor Sitting balance - Comments: frequent falling backwards due to near syncope                                     Pertinent Vitals/Pain Pain Assessment: 0-10 Pain Score: 5  Pain Location: low back Pain Descriptors / Indicators: Aching;Sore Pain Intervention(s): Limited activity within patient's tolerance;Monitored during session;Premedicated before session    Home Living Family/patient expects to be discharged to:: Private residence Living Arrangements: Spouse/significant other                    Prior Function Level of Independence: Independent               Hand Dominance        Extremity/Trunk Assessment   Upper Extremity Assessment Upper Extremity Assessment: Generalized weakness    Lower Extremity Assessment Lower Extremity Assessment: Generalized weakness    Cervical / Trunk Assessment Cervical / Trunk Assessment: Normal  Communication   Communication: No difficulties  Cognition Arousal/Alertness: Lethargic Behavior During Therapy: WFL for tasks assessed/performed Overall Cognitive  Status: Within Functional Limits for tasks assessed                                 General Comments: required repeated verbal/tactile cue due to lethargy      General Comments      Exercises     Assessment/Plan    PT Assessment Patient needs continued PT services  PT Problem List Decreased strength;Decreased activity tolerance;Decreased balance;Decreased mobility       PT Treatment Interventions Gait training;Stair training;Functional mobility  training;Therapeutic activities;Therapeutic exercise;Balance training;Patient/family education    PT Goals (Current goals can be found in the Care Plan section)  Acute Rehab PT Goals Patient Stated Goal: return home PT Goal Formulation: With patient/family Time For Goal Achievement: 08/09/19 Potential to Achieve Goals: Good    Frequency Min 2X/week   Barriers to discharge        Co-evaluation               AM-PAC PT "6 Clicks" Mobility  Outcome Measure Help needed turning from your back to your side while in a flat bed without using bedrails?: A Little Help needed moving from lying on your back to sitting on the side of a flat bed without using bedrails?: A Lot Help needed moving to and from a bed to a chair (including a wheelchair)?: Total Help needed standing up from a chair using your arms (e.g., wheelchair or bedside chair)?: Total Help needed to walk in hospital room?: Total Help needed climbing 3-5 steps with a railing? : Total 6 Click Score: 9    End of Session   Activity Tolerance: Patient limited by fatigue;Patient limited by lethargy Patient left: in bed Nurse Communication: Mobility status PT Visit Diagnosis: Unsteadiness on feet (R26.81);Other abnormalities of gait and mobility (R26.89);Muscle weakness (generalized) (M62.81)    Time: 9622-2979 PT Time Calculation (min) (ACUTE ONLY): 23 min   Charges:   PT Evaluation $PT Eval Moderate Complexity: 1 Mod PT Treatments $Therapeutic Activity: 23-37 mins        3:21 PM, 07/26/19 Lonell Grandchild, MPT Physical Therapist with [facility] [phone] office [phone] mobile phone

## 2019-07-26 NOTE — H&P (Signed)
TRH H&P    Patient Demographics:    Jacob Hunter, is a 63 y.o. male  MRN: [id number]  DOB - 02/14/1956  Admit Date - 07/25/2019  Referring MD/NP/PA: Evalee Jefferson  Outpatient Primary MD for the patient is Asencion Noble, MD  Patient coming from: Home  Chief complaint-back pain   HPI:    Jacob Hunter  is a 63 y.o. male, with history of necrotizing myopathy, on chronic prednisone 15 mg daily, 1 g Solu-Medrol every 4 weeks, Rituxan every 6 months, polymyositis, hyperlipidemia who came to hospital with complaints of abdominal pain and back pain for past 3 days.  As per patient wife this started after patient lifted up a heavy dog food bag.  Patient said that the pain has been persistent, get worse on movement.  It is not associated with nausea vomiting or diarrhea.  Denies chest pain or shortness of breath.  He does have history of spinal stenosis.  MRI lumbar spine from 2016 showed moderate spinal stenosis L3-L4, L4-5 central disc protrusion with moderate spinal stenosis, small central disc protrusion at L5-S1 without neural impingement. He denies fever or chills.  But in the ED he had a temperature of 103.1.  He is not hypoxic.  Initial Covid antigen test was negative.  SARS-CoV-2 RT-PCR 6-24 is currently pending. CT angio chest abdomen and pelvis was obtained in the ED which is unremarkable.    Review of systems:    In addition to the HPI above,    All other systems reviewed and are negative.    Past History of the following :    Past Medical History:  Diagnosis Date  . Arthritis   . Hypercholesteremia   . Knee pain   . Necrotizing myopathy   . Polymyositis [facility])       Past Surgical History:  Procedure Laterality Date  . ANKLE SURGERY Left   . KNEE SURGERY Left       Social History:      Social History   Tobacco Use  . Smoking status: Never Smoker  . Smokeless tobacco: Never Used    Substance Use Topics  . Alcohol use: No    Alcohol/week: 0.0 standard drinks    Comment: Last ETOH was 1-2 drinks in 1990.       Family History :     Family History  Problem Relation Age of Onset  . Hypertension Father   . Hypercholesterolemia Father   . Hypercholesterolemia Sister   . Hypertension Mother   . Breast cancer Sister   . Colon cancer Neg Hx   . Liver disease Neg Hx       Home Medications:   Prior to Admission medications   Medication Sig Start Date End Date Taking? Authorizing Provider  ascorbic acid (VITAMIN C) 500 MG tablet Take 1,000 mg by mouth daily.     [provider]  calcium-vitamin D (OSCAL WITH D) 500-200 MG-UNIT tablet Take 1 tablet by mouth daily.    [provider]  Coenzyme Q10 (CO Q 10) 10 MG CAPS  Take 10 mg by mouth daily.    [provider]  FLUoxetine (PROZAC) 20 MG capsule Take 20 mg by mouth daily.    [provider]  folic acid (FOLVITE) 1 MG tablet Take 1 mg by mouth daily.    [provider]  methotrexate (RHEUMATREX) 2.5 MG tablet Take 25 mg by mouth once a week. Caution:Chemotherapy. Protect from light.(monday) 10 tabs total    [provider]  methylPREDNISolone sodium succinate (SOLU-MEDROL) 1000 MG injection Inject 1,000 mg into the vein once.    [provider]  predniSONE (DELTASONE) 10 MG tablet Take 15 mg by mouth daily with breakfast.     [provider]  riTUXimab (RITUXAN) 100 MG/10ML injection Inject into the vein.    [provider]     Allergies:     Allergies  Allergen Reactions  . Methylprednisolone Anaphylaxis    Rash, SOB, ?anaphylaxis?  . Other     High doses of steroids causes chest pain.   . Statins Other (See Comments)    Statin induced necrotizing myopathy with continuing antibody mediated myopathy     Physical Exam:   Vitals  Blood pressure (!) 144/81, pulse 92, temperature 99 F (37.2 C), resp. rate 17, height '6\' 1"'   (1.854 m), weight 106.1 kg, SpO2 96 %.  1.  General: Appears in no acute distress  2. Psychiatric: Alert, oriented x3, intact insight and judgment  3. Neurologic: Cranial nerves II through XII grossly intact, motor strength 5/5 in all extremities, sensations are intact  4. HEENMT:  Atraumatic normocephalic, extraocular muscles are intact  5. Respiratory : Clear to auscultation bilaterally, no wheezing or crackles auscultated  6. Cardiovascular : S1-S2, regular, no murmur auscultated  7. Gastrointestinal:  Abdomen is soft, nontender, no organomegaly  8. Skin:  No rashes noted  9.Musculoskeletal:  Patient has tenderness in para vertebral muscles at lower back, SLR positive in right lower extremity at 60 degrees, in left lower extremity at 30 degrees    Data Review:    CBC Recent Labs  Lab 07/25/19 2109  WBC 8.6  HGB 12.7*  HCT 40.2  PLT 156  MCV 96.4  MCH 30.5  MCHC 31.6  RDW 15.9*  LYMPHSABS 0.3*  MONOABS 0.8  EOSABS 0.1  BASOSABS 0.0   ------------------------------------------------------------------------------------------------------------------  Results for orders placed or performed during the hospital encounter of 07/25/19 (from the past 48 hour(s))  Lipase, blood     Status: None   Collection Time: 07/25/19  9:09 PM  Result Value Ref Range   Lipase 32 11 - 51 U/L    Comment: Performed at [facility], [address] [place], [place] [address]  Comprehensive metabolic panel     Status: Abnormal   Collection Time: 07/25/19  9:09 PM  Result Value Ref Range   Sodium 134 (L) 135 - 145 mmol/L   Potassium 3.9 3.5 - 5.1 mmol/L   Chloride 95 (L) 98 - 111 mmol/L   CO2 25 22 - 32 mmol/L   Glucose, Bld 141 (H) 70 - 99 mg/dL   BUN 17 8 - 23 mg/dL   Creatinine, Ser 0.88 0.61 - 1.24 mg/dL   Calcium 8.8 (L) 8.9 - 10.3 mg/dL   Total Protein 7.4 6.5 - 8.1 g/dL   Albumin 4.0 3.5 - 5.0 g/dL   AST 63 (H) 15 - 41 U/L   ALT 68 (H) 0 - 44 U/L   Alkaline  Phosphatase 64 38 - 126 U/L   Total  Bilirubin 1.0 0.3 - 1.2 mg/dL   GFR calc non Af Amer >60 >60 mL/min   GFR calc Af Amer >60 >60 mL/min   Anion gap 14 5 - 15    Comment: Performed at [facility], [address] [place], [place] [address]  CBC with Differential     Status: Abnormal   Collection Time: 07/25/19  9:09 PM  Result Value Ref Range   WBC 8.6 4.0 - 10.5 K/uL   RBC 4.17 (L) 4.22 - 5.81 MIL/uL   Hemoglobin 12.7 (L) 13.0 - 17.0 g/dL   HCT 40.2 39.0 - 52.0 %   MCV 96.4 80.0 - 100.0 fL   MCH 30.5 26.0 - 34.0 pg   MCHC 31.6 30.0 - 36.0 g/dL   RDW 15.9 (H) 11.5 - 15.5 %   Platelets 156 150 - 400 K/uL   nRBC 0.0 0.0 - 0.2 %   Neutrophils Relative % 85 %   Neutro Abs 7.2 1.7 - 7.7 K/uL   Lymphocytes Relative 4 %   Lymphs Abs 0.3 (L) 0.7 - 4.0 K/uL   Monocytes Relative 9 %   Monocytes Absolute 0.8 0.1 - 1.0 K/uL   Eosinophils Relative 1 %   Eosinophils Absolute 0.1 0.0 - 0.5 K/uL   Basophils Relative 0 %   Basophils Absolute 0.0 0.0 - 0.1 K/uL   Immature Granulocytes 1 %   Abs Immature Granulocytes 0.12 (H) 0.00 - 0.07 K/uL    Comment: Performed at [facility], [address] [place], [place] [address]  CK     Status: Abnormal   Collection Time: 07/25/19  9:09 PM  Result Value Ref Range   Total CK 680 (H) 49 - 397 U/L    Comment: Performed at [facility], [address], [place], [place] [address]  Urinalysis, Routine w reflex microscopic     Status: Abnormal   Collection Time: 07/26/19 12:06 AM  Result Value Ref Range   Color, Urine YELLOW YELLOW   APPearance CLEAR CLEAR   Specific Gravity, Urine >1.046 (H) 1.005 - 1.030   pH 6.0 5.0 - 8.0   Glucose, UA NEGATIVE NEGATIVE mg/dL   Hgb urine dipstick NEGATIVE NEGATIVE   Bilirubin Urine NEGATIVE NEGATIVE   Ketones, ur 5 (A) NEGATIVE mg/dL   Protein, ur NEGATIVE NEGATIVE mg/dL   Nitrite NEGATIVE NEGATIVE   Leukocytes,Ua NEGATIVE NEGATIVE    Comment: Performed at [facility], [address], [place], [place] [address]    CBG monitoring, ED     Status: Abnormal   Collection Time: 07/26/19  1:03 AM  Result Value Ref Range   Glucose-Capillary 117 (H) 70 - 99 mg/dL  POC SARS Coronavirus 2 Ag-ED - Nasal Swab (BD Veritor Kit)     Status: None   Collection Time: 07/26/19  3:38 AM  Result Value Ref Range   SARS Coronavirus 2 Ag NEGATIVE NEGATIVE    Comment: (NOTE) SARS-CoV-2 antigen NOT DETECTED.  Negative results are presumptive.  Negative results do not preclude SARS-CoV-2 infection and should not be used as the sole basis for treatment or other patient management decisions, including infection  control decisions, particularly in the presence of clinical signs and  symptoms consistent with COVID-19, or in those who have been in contact with the virus.  Negative results must be combined with clinical observations, patient history, and epidemiological information. The expected result is Negative. Fact Sheet for Patients: PodPark.tn Fact Sheet for Healthcare Providers: GiftContent.is This test is not yet approved or cleared by  the [employer] and  has been authorized for detection and/or diagnosis of SARS-CoV-2 by FDA under an Emergency Use Authorization (EUA).  This EUA will remain in effect (meaning this test can be used) for the duration of  the COVID-19 de claration under Section 564(b)(1) of the Act, 21 U.S.C. section 360bbb-3(b)(1), unless the authorization is terminated or revoked sooner.     Chemistries  Recent Labs  Lab 07/25/19 2109  NA 134*  K 3.9  CL 95*  CO2 25  GLUCOSE 141*  BUN 17  CREATININE 0.88  CALCIUM 8.8*  AST 63*  ALT 68*  ALKPHOS 64  BILITOT 1.0   ------------------------------------------------------------------------------------------------------------------  ------------------------------------------------------------------------------------------------------------------ GFR: Estimated Creatinine  Clearance: 112.7 mL/min (by C-G formula based on SCr of 0.88 mg/dL). Liver Function Tests: Recent Labs  Lab 07/25/19 2109  AST 63*  ALT 68*  ALKPHOS 64  BILITOT 1.0  PROT 7.4  ALBUMIN 4.0   Recent Labs  Lab 07/25/19 2109  LIPASE 32   No results for input(s): AMMONIA in the last 168 hours. Coagulation Profile: No results for input(s): INR, PROTIME in the last 168 hours. Cardiac Enzymes: Recent Labs  Lab 07/25/19 2109  CKTOTAL 680*   BNP (last 3 results) No results for input(s): PROBNP in the last 8760 hours. HbA1C: No results for input(s): HGBA1C in the last 72 hours. CBG: Recent Labs  Lab 07/26/19 0103  GLUCAP 117*   Lipid Profile: No results for input(s): CHOL, HDL, LDLCALC, TRIG, CHOLHDL, LDLDIRECT in the last 72 hours. Thyroid Function Tests: No results for input(s): TSH, T4TOTAL, FREET4, T3FREE, THYROIDAB in the last 72 hours. Anemia Panel: No results for input(s): VITAMINB12, FOLATE, FERRITIN, TIBC, IRON, RETICCTPCT in the last 72 hours.  --------------------------------------------------------------------------------------------------------------- Urine analysis:    Component Value Date/Time   COLORURINE YELLOW 07/26/2019 0006   APPEARANCEUR CLEAR 07/26/2019 0006   LABSPEC >1.046 (H) 07/26/2019 0006   PHURINE 6.0 07/26/2019 0006   GLUCOSEU NEGATIVE 07/26/2019 0006   HGBUR NEGATIVE 07/26/2019 0006   BILIRUBINUR NEGATIVE 07/26/2019 0006   KETONESUR 5 (A) 07/26/2019 0006   PROTEINUR NEGATIVE 07/26/2019 0006   UROBILINOGEN 0.2 01/24/2015 1748   NITRITE NEGATIVE 07/26/2019 0006   LEUKOCYTESUR NEGATIVE 07/26/2019 0006      Imaging Results:    CT Angio Chest/Abd/Pel for Dissection W and/or Wo Contrast  Result Date: 07/25/2019 CLINICAL DATA:  Abdominal pain and low back pain for 3 days EXAM: CT ANGIOGRAPHY CHEST, ABDOMEN AND PELVIS TECHNIQUE: Multidetector CT imaging through the chest, abdomen and pelvis was performed using the standard protocol during  bolus administration of intravenous contrast. Multiplanar reconstructed images and MIPs were obtained and reviewed to evaluate the vascular anatomy. CONTRAST:  18m OMNIPAQUE IOHEXOL 350 MG/ML SOLN COMPARISON:  11/18/2014 FINDINGS: CTA CHEST FINDINGS Cardiovascular: Initial precontrast images show no hyperdense crescent to suggest intramural hematoma. The thoracic aorta demonstrates mild atherosclerotic calcifications. No cardiac enlargement is seen. No coronary calcifications are noted. The descending thoracic aorta appears within normal limits. The pulmonary artery shows a normal branching pattern without filling defect to suggest pulmonary embolism. Mediastinum/Nodes: The thoracic inlet is within normal limits. No sizable hilar or mediastinal adenopathy is noted. The esophagus as visualized is within normal limits. Lungs/Pleura: The lungs are well aerated bilaterally. No focal infiltrate, effusion or pneumothorax is seen. No parenchymal nodules. Musculoskeletal: No acute bony abnormality is seen. Review of the MIP images confirms the above findings. CTA ABDOMEN AND PELVIS FINDINGS VASCULAR Aorta: Abdominal aorta shows no aneurysmal dilatation or evidence of  dissection. Minimal atherosclerotic calcifications are noted. Celiac: Patent without evidence of aneurysm, dissection, vasculitis or significant stenosis. SMA: Patent without evidence of aneurysm, dissection, vasculitis or significant stenosis. Renals: Both renal arteries are patent without evidence of aneurysm, dissection, vasculitis, fibromuscular dysplasia or significant stenosis. IMA: Patent without evidence of aneurysm, dissection, vasculitis or significant stenosis. Iliacs: Within normal limits. Veins: No specific venous abnormality is noted. Review of the MIP images confirms the above findings. NON-VASCULAR Hepatobiliary: Scattered small cysts are noted throughout the liver stable from prior exam. The gallbladder is within normal limits. Pancreas:  Unremarkable. No pancreatic ductal dilatation or surrounding inflammatory changes. Spleen: Normal in size without focal abnormality. Adrenals/Urinary Tract: Adrenal glands are within normal limits. Kidneys are well visualized bilaterally. A few tiny nonobstructing right renal stones are noted. The ureters are within normal limits. The bladder is partially distended. Stomach/Bowel: Scattered mild diverticular change is seen. No evidence of diverticulitis is noted. Stomach and small bowel are within normal limits. The appendix is within normal limits Lymphatic: No significant lymphadenopathy is noted. Reproductive: Prostate is unremarkable. Other: No abdominal wall hernia or abnormality. No abdominopelvic ascites. Musculoskeletal: No acute or significant osseous findings. Review of the MIP images confirms the above findings. IMPRESSION: CTA of the chest: No aneurysmal dilatation or dissection of the aorta is noted. The pulmonary artery as visualized is within normal limits. No other focal abnormality is noted. CTA of the abdomen and pelvis: Normal-appearing abdominal aorta. Nonobstructing right renal stones. Diverticular disease without diverticulitis. Stable small hepatic cysts. Electronically Signed   By: Inez Catalina M.D.   On: 07/25/2019 22:17    My personal review of EKG: Rhythm NSR, no ST-T changes   Assessment & Plan:    Active Problems:   Back pain   1. Back pain-patient presenting with low back pain, muscle spasm.  He has significant history of spinal stenosis at L3-4, L5-S1 with disc protrusion.  Will obtain MRI of lumbar spine without contrast.  Start Flexeril 10 mg 3 times daily, Dilaudid 1 mg IV every 4 hours as needed.  Follow MRI and consider neurosurgical evaluation.  2. Polymyositis/necrotizing myopathy-patient has chronically elevated CPK.  Continue prednisone 15 mg daily, patient got Solu-Medrol 1000 mg every 4 weeks, Rituxan 375 mg/m weekly x4 every 6 months, methotrexate 25 mg  weekly.  We will continue with prednisone 15 mg daily.  Today CPK is 600, review of the records from [facility] showed that patient's CPK is chronically elevated in 400s.  3. Fever-patient had 1 reading of temperature 103.1 F in the ED. repeat temperature is 99 F.  Initial Covid 19 antigen test is negative.  SARS-CoV-2 RT-PCR is obtained, result is currently pending.  Patient has no symptoms of COVID-19 infection.  He is not hypoxic, not requiring oxygen.    DVT Prophylaxis-   Lovenox   AM Labs Ordered, also please review Full Orders  Family Communication: Admission, patients condition and plan of care including tests being ordered have been discussed with the patient and his wife at bedside who indicate understanding and agree with the plan and Code Status.  Code Status: Full code  Admission status: Inpatient: Based on patients clinical presentation and evaluation of above clinical data, I have made determination that patient meets Inpatient criteria at this time.  Time spent in minutes : 60 minutes   Lott Seelbach S Kyden Potash M.D

## 2019-07-26 NOTE — ED Notes (Addendum)
Patient hallucinating in room .  Hospitalist notified of the change.  No new orders. Wants to hold off on future dilaudid orders

## 2019-07-26 NOTE — Consult Note (Addendum)
Reason for Consult: Spinal stenosis Referring Physician: Dr. Elfredia Nevins Jacob Hunter is an 63 y.o. male.  HPI: History regarding current illness gathered from the referring physician. Patient not personally examined by Neurosurgery. 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 He is on chronic prednisone. He presented to the ED due to abdominal and back pain for the past three days. His symptoms started following lifting a heavy dog food bag. Per information provided by attending physician, Jacob Hunter denies numbness, tingling, or weakness of his lower extremities. MRI demonstrated lumbar spinal stenosis and Neurosurgery was consulted for further evaluation and recommendations.  Past Medical History:  Diagnosis Date  . Arthritis   . Hypercholesteremia   . Knee pain   . Necrotizing myopathy   . Polymyositis [facility])     Past Surgical History:  Procedure Laterality Date  . ANKLE SURGERY Left   . KNEE SURGERY Left     Family History  Problem Relation Age of Onset  . Hypertension Father   . Hypercholesterolemia Father   . Hypercholesterolemia Sister   . Hypertension Mother   . Breast cancer Sister   . Colon cancer Neg Hx   . Liver disease Neg Hx     Social History:  reports that he has never smoked. He has never used smokeless tobacco. He reports that he does not drink alcohol or use drugs.  Allergies:  Allergies  Allergen Reactions  . Methylprednisolone Anaphylaxis    Rash, SOB, ?anaphylaxis?  . Other     High doses of steroids causes chest pain.   . 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  Results for orders placed or performed during the hospital encounter of 07/25/19 (from the past 48 hour(s))  Lipase, blood     Status: None   Collection Time: 07/25/19  9:09 PM  Result Value Ref Range    Lipase 32 11 - 51 U/L    Comment: Performed at [facility], [address], [place], [place] [address]  Comprehensive metabolic panel     Status: Abnormal   Collection Time: 07/25/19  9:09 PM  Result Value Ref Range   Sodium 134 (L) 135 - 145 mmol/L   Potassium 3.9 3.5 - 5.1 mmol/L   Chloride 95 (L) 98 - 111 mmol/L   CO2 25 22 - 32 mmol/L   Glucose, Bld 141 (H) 70 - 99 mg/dL   BUN 17 8 - 23 mg/dL   Creatinine, Ser 0.88 0.61 - 1.24 mg/dL   Calcium 8.8 (L) 8.9 - 10.3 mg/dL   Total Protein 7.4 6.5 - 8.1 g/dL   Albumin 4.0 3.5 - 5.0 g/dL   AST 63 (H) 15 - 41 U/L   ALT 68 (H) 0 - 44 U/L   Alkaline Phosphatase 64 38 - 126 U/L   Total Bilirubin 1.0 0.3 - 1.2 mg/dL   GFR calc non Af Amer >60 >60 mL/min   GFR calc Af Amer >60 >60 mL/min   Anion gap 14 5 - 15    Comment: Performed at [facility], [address] [place], [place] [address]  CBC with Differential     Status: Abnormal   Collection Time: 07/25/19  9:09 PM  Result Value Ref Range   WBC 8.6 4.0 - 10.5 K/uL   RBC 4.17 (L) 4.22 - 5.81 MIL/uL   Hemoglobin 12.7 (L) 13.0 -  17.0 g/dL   HCT 40.2 39.0 - 52.0 %   MCV 96.4 80.0 - 100.0 fL   MCH 30.5 26.0 - 34.0 pg   MCHC 31.6 30.0 - 36.0 g/dL   RDW 15.9 (H) 11.5 - 15.5 %   Platelets 156 150 - 400 K/uL   nRBC 0.0 0.0 - 0.2 %   Neutrophils Relative % 85 %   Neutro Abs 7.2 1.7 - 7.7 K/uL   Lymphocytes Relative 4 %   Lymphs Abs 0.3 (L) 0.7 - 4.0 K/uL   Monocytes Relative 9 %   Monocytes Absolute 0.8 0.1 - 1.0 K/uL   Eosinophils Relative 1 %   Eosinophils Absolute 0.1 0.0 - 0.5 K/uL   Basophils Relative 0 %   Basophils Absolute 0.0 0.0 - 0.1 K/uL   Immature Granulocytes 1 %   Abs Immature Granulocytes 0.12 (H) 0.00 - 0.07 K/uL    Comment: Performed at [facility], [address] [place], [place] [address]  CK     Status: Abnormal   Collection Time: 07/25/19  9:09 PM  Result Value Ref Range   Total CK 680 (H) 49 - 397 U/L    Comment: Performed at [facility], [address], [place], [place] [address]  Urinalysis, Routine w reflex microscopic     Status: Abnormal   Collection Time: 07/26/19 12:06 AM  Result Value Ref Range   Color, Urine YELLOW YELLOW   APPearance CLEAR CLEAR   Specific Gravity, Urine >1.046 (H) 1.005 - 1.030   pH 6.0 5.0 - 8.0   Glucose, UA NEGATIVE NEGATIVE mg/dL   Hgb urine dipstick NEGATIVE NEGATIVE   Bilirubin Urine NEGATIVE NEGATIVE   Ketones, ur 5 (A) NEGATIVE mg/dL   Protein, ur NEGATIVE NEGATIVE mg/dL   Nitrite NEGATIVE NEGATIVE   Leukocytes,Ua NEGATIVE NEGATIVE    Comment: Performed at [facility], [address], [place], [place] [address]  CBG monitoring, ED     Status: Abnormal   Collection Time: 07/26/19  1:03 AM  Result Value Ref Range   Glucose-Capillary 117 (H) 70 - 99 mg/dL  POC SARS Coronavirus 2 Ag-ED - Nasal Swab (BD Veritor Kit)     Status: None   Collection Time: 07/26/19  3:38 AM  Result Value Ref Range   SARS Coronavirus 2 Ag NEGATIVE NEGATIVE    Comment: (NOTE) SARS-CoV-2 antigen NOT DETECTED.  Negative results are presumptive.  Negative results do not preclude SARS-CoV-2 infection and should not be used as the sole basis for treatment or other patient management decisions, including infection  control decisions, particularly in the presence of clinical signs and  symptoms consistent with COVID-19, or in those who have been in contact with the virus.  Negative results must be combined with clinical observations, patient history, and epidemiological information. The expected result is Negative. Fact Sheet for Patients: PodPark.tn Fact Sheet for Healthcare Providers: GiftContent.is This test is not yet approved or cleared by the [place] FDA and  has been authorized for detection and/or diagnosis of SARS-CoV-2 by FDA under an Emergency Use Authorization (EUA).  This EUA will remain in effect (meaning this test can be used) for the duration of   the COVID-19 de claration under Section 564(b)(1) of the Act, 21 U.S.C. section 360bbb-3(b)(1), unless the authorization is terminated or revoked sooner.   SARS CORONAVIRUS 2 (TAT 6-24 HRS) Nasopharyngeal Nasopharyngeal Swab     Status: None   Collection Time: 07/26/19  3:43 AM   Specimen: Nasopharyngeal Swab  Result  Value Ref Range   SARS Coronavirus 2 NEGATIVE NEGATIVE    Comment: (NOTE) SARS-CoV-2 target nucleic acids are NOT DETECTED. The SARS-CoV-2 RNA is generally detectable in upper and lower respiratory specimens during the acute phase of infection. Negative results do not preclude SARS-CoV-2 infection, do not rule out co-infections with other pathogens, and should not be used as the sole basis for treatment or other patient management decisions. Negative results must be combined with clinical observations, patient history, and epidemiological information. The expected result is Negative. Fact Sheet for Patients: SugarRoll.be Fact Sheet for Healthcare Providers: https://www.woods-mathews.com/ This test is not yet approved or cleared by the [place] FDA and  has been authorized for detection and/or diagnosis of SARS-CoV-2 by FDA under an Emergency Use Authorization (EUA). This EUA will remain  in effect (meaning this test can be used) for the duration of the COVID-19 declaration under Section 56 4(b)(1) of the Act, 21 U.S.C. section 360bbb-3(b)(1), unless the authorization is terminated or revoked sooner. Performed at [facility], [place] [address], [place], [place] [address]   CBC     Status: Abnormal   Collection Time: 07/26/19  5:59 AM  Result Value Ref Range   WBC 9.3 4.0 - 10.5 K/uL   RBC 3.63 (L) 4.22 - 5.81 MIL/uL   Hemoglobin 11.2 (L) 13.0 - 17.0 g/dL   HCT 34.2 (L) 39.0 - 52.0 %   MCV 94.2 80.0 - 100.0 fL   MCH 30.9 26.0 - 34.0 pg   MCHC 32.7 30.0 - 36.0 g/dL   RDW 15.9 (H) 11.5 - 15.5 %   Platelets 126 (L)  150 - 400 K/uL   nRBC 0.0 0.0 - 0.2 %    Comment: Performed at [facility], [address], [place], [place] [address]  Comprehensive metabolic panel     Status: Abnormal   Collection Time: 07/26/19  5:59 AM  Result Value Ref Range   Sodium 133 (L) 135 - 145 mmol/L   Potassium 3.3 (L) 3.5 - 5.1 mmol/L   Chloride 101 98 - 111 mmol/L   CO2 23 22 - 32 mmol/L   Glucose, Bld 105 (H) 70 - 99 mg/dL   BUN 14 8 - 23 mg/dL   Creatinine, Ser 0.80 0.61 - 1.24 mg/dL   Calcium 7.9 (L) 8.9 - 10.3 mg/dL   Total Protein 6.0 (L) 6.5 - 8.1 g/dL   Albumin 3.3 (L) 3.5 - 5.0 g/dL   AST 67 (H) 15 - 41 U/L   ALT 69 (H) 0 - 44 U/L   Alkaline Phosphatase 53 38 - 126 U/L   Total Bilirubin 1.1 0.3 - 1.2 mg/dL   GFR calc non Af Amer >60 >60 mL/min   GFR calc Af Amer >60 >60 mL/min   Anion gap 9 5 - 15    Comment: Performed at [facility], [address] [place], [place] [address]    MR LUMBAR SPINE WO CONTRAST  Result Date: 07/26/2019 CLINICAL DATA:  Abdominal and low back pain for 3 days, worsened last night. EXAM: MRI LUMBAR SPINE WITHOUT CONTRAST TECHNIQUE: Multiplanar, multisequence MR imaging of the lumbar spine was performed. No intravenous contrast was administered. COMPARISON:  MRI lumbar spine 01/24/2015. FINDINGS: Segmentation:  Standard. Alignment:  Maintained. Vertebrae:  No fracture, evidence of discitis, or bone lesion. Conus medullaris and cauda equina: Conus extends to the L1 level. Conus and cauda equina appear normal. Paraspinal and other soft tissues: Negative. Disc levels: T11-12 and T12-L1 are imaged in the  sagittal plane only and negative. L1-2: Negative. L2-3: There is a disc bulge and ligamentum flavum thickening causing moderate central canal stenosis, increased compared to the prior MRI. Foramina are open. L3-4: Moderate facet arthropathy, broad-based disc bulge and mild ligamentum flavum thickening. Moderate central canal stenosis and mild to moderate foraminal narrowing, worse on the  right. No change compared to the prior MRI. L4-5: Central and eccentric to the left disc protrusion causes moderate central canal and left worse than right subarticular recess narrowing. Neural foramina are open. No change. L5-S1: Shallow broad-based right paracentral protrusion slightly indents the ventral thecal sac and causes mild narrowing in the subarticular recesses. Mild bilateral foraminal narrowing. No change. IMPRESSION: 1. No change in the appearance of the lumbar spine since the prior MRI. 2. Moderate central canal stenosis at L2-3 due to a disc bulge and ligamentum flavum thickening is increased compared to the prior MRI. 3. No change in moderate central canal stenosis and mild to moderate foraminal narrowing at L3-4. 4. No change in moderate central canal and left worse than right subarticular recess narrowing at L4-5. 5. No change in a shallow broad-based right paracentral protrusion at L5-S1 causes mild narrowing in the subarticular recesses. Electronically Signed   By: Inge Rise M.D.   On: 07/26/2019 07:49   CT Angio Chest/Abd/Pel for Dissection W and/or Wo Contrast  Result Date: 07/25/2019 CLINICAL DATA:  Abdominal pain and low back pain for 3 days EXAM: CT ANGIOGRAPHY CHEST, ABDOMEN AND PELVIS TECHNIQUE: Multidetector CT imaging through the chest, abdomen and pelvis was performed using the standard protocol during bolus administration of intravenous contrast. Multiplanar reconstructed images and MIPs were obtained and reviewed to evaluate the vascular anatomy. CONTRAST:  116m OMNIPAQUE IOHEXOL 350 MG/ML SOLN COMPARISON:  11/18/2014 FINDINGS: CTA CHEST FINDINGS Cardiovascular: Initial precontrast images show no hyperdense crescent to suggest intramural hematoma. The thoracic aorta demonstrates mild atherosclerotic calcifications. No cardiac enlargement is seen. No coronary calcifications are noted. The descending thoracic aorta appears within normal limits. The pulmonary artery shows a  normal branching pattern without filling defect to suggest pulmonary embolism. Mediastinum/Nodes: The thoracic inlet is within normal limits. No sizable hilar or mediastinal adenopathy is noted. The esophagus as visualized is within normal limits. Lungs/Pleura: The lungs are well aerated bilaterally. No focal infiltrate, effusion or pneumothorax is seen. No parenchymal nodules. Musculoskeletal: No acute bony abnormality is seen. Review of the MIP images confirms the above findings. CTA ABDOMEN AND PELVIS FINDINGS VASCULAR Aorta: Abdominal aorta shows no aneurysmal dilatation or evidence of dissection. Minimal atherosclerotic calcifications are noted. Celiac: Patent without evidence of aneurysm, dissection, vasculitis or significant stenosis. SMA: Patent without evidence of aneurysm, dissection, vasculitis or significant stenosis. Renals: Both renal arteries are patent without evidence of aneurysm, dissection, vasculitis, fibromuscular dysplasia or significant stenosis. IMA: Patent without evidence of aneurysm, dissection, vasculitis or significant stenosis. Iliacs: Within normal limits. Veins: No specific venous abnormality is noted. Review of the MIP images confirms the above findings. NON-VASCULAR Hepatobiliary: Scattered small cysts are noted throughout the liver stable from prior exam. The gallbladder is within normal limits. Pancreas: Unremarkable. No pancreatic ductal dilatation or surrounding inflammatory changes. Spleen: Normal in size without focal abnormality. Adrenals/Urinary Tract: Adrenal glands are within normal limits. Kidneys are well visualized bilaterally. A few tiny nonobstructing right renal stones are noted. The ureters are within normal limits. The bladder is partially distended. Stomach/Bowel: Scattered mild diverticular change is seen. No evidence of diverticulitis is noted. Stomach and small bowel are within normal  limits. The appendix is within normal limits Lymphatic: No significant  lymphadenopathy is noted. Reproductive: Prostate is unremarkable. Other: No abdominal wall hernia or abnormality. No abdominopelvic ascites. Musculoskeletal: No acute or significant osseous findings. Review of the MIP images confirms the above findings. IMPRESSION: CTA of the chest: No aneurysmal dilatation or dissection of the aorta is noted. The pulmonary artery as visualized is within normal limits. No other focal abnormality is noted. CTA of the abdomen and pelvis: Normal-appearing abdominal aorta. Nonobstructing right renal stones. Diverticular disease without diverticulitis. Stable small hepatic cysts. Electronically Signed   By: Inez Catalina M.D.   On: 07/25/2019 22:17     Blood pressure 139/83, pulse 99, temperature 98 F (36.7 C), temperature source Oral, resp. rate 20, height _0  (1.854 m), weight 106.1 kg, SpO2 96 %.   Assessment/Plan: Patient with chronic lumbar stenosis at L2-3, L3-4, L4-5, and L5-S1.  Perhaps some worsening of the left-sided L4-5 disc herniation.  No indication for urgent intervention.  Recommend pain medication, muscle relaxant, and temporary increase in steroids. Jacob Hunter can follow up as an outpatient with Dr. Annette Stable.  Patricia Nettle 07/26/2019, 12:41 PM

## 2019-07-26 NOTE — ED Notes (Signed)
per family pt has intermittent episodes where he seems semi responsive, during the times this nurse has been called the pt is alert to verbal stimuli

## 2019-07-27 DIAGNOSIS — M4800 Spinal stenosis, site unspecified: Secondary | ICD-10-CM

## 2019-07-27 DIAGNOSIS — M3320 Polymyositis, organ involvement unspecified: Secondary | ICD-10-CM

## 2019-07-27 LAB — LACTIC ACID, PLASMA
Lactic Acid, Venous: 2.2 mmol/L (ref 0.5–1.9)
Lactic Acid, Venous: 1.2 mmol/L (ref 0.5–1.9)

## 2019-07-27 LAB — PROCALCITONIN: Procalcitonin: 0.64 ng/mL

## 2019-07-27 MED ORDER — CLONAZEPAM 0.5 MG PO TABS: 0.5000 mg | ORAL_TABLET | Freq: Two times a day (BID) | ORAL | Status: DC | PRN

## 2019-07-27 MED ORDER — GABAPENTIN 300 MG PO CAPS
300.0000 mg | ORAL_CAPSULE | Freq: Three times a day (TID) | ORAL | Status: DC
  Administered 2019-07-27 – 2019-07-29 (×8): 300 mg via ORAL
  Filled 2019-07-27 (×8): qty 1

## 2019-07-27 MED ORDER — KETOROLAC TROMETHAMINE 30 MG/ML IJ SOLN
30.0000 mg | Freq: Four times a day (QID) | INTRAMUSCULAR | Status: DC
  Administered 2019-07-27 – 2019-07-29 (×10): 30 mg via INTRAVENOUS
  Filled 2019-07-27 (×10): qty 1

## 2019-07-27 MED ORDER — PREDNISONE 20 MG PO TABS: 60.0000 mg | ORAL_TABLET | Freq: Every day | ORAL | Status: DC

## 2019-07-27 MED ORDER — LORAZEPAM 2 MG/ML IJ SOLN: 0.5000 mg | Freq: Four times a day (QID) | INTRAMUSCULAR | Status: DC

## 2019-07-27 MED ORDER — CHLORHEXIDINE GLUCONATE CLOTH 2 % EX PADS
6.0000 | MEDICATED_PAD | Freq: Every day | CUTANEOUS | Status: DC
  Administered 2019-07-27 – 2019-07-28 (×2): 6 via TOPICAL

## 2019-07-27 MED ORDER — CLONAZEPAM 0.5 MG PO TABS
0.5000 mg | ORAL_TABLET | Freq: Two times a day (BID) | ORAL | Status: DC
  Administered 2019-07-27: 0.5 mg via ORAL
  Filled 2019-07-27: qty 1

## 2019-07-27 MED ORDER — PREDNISONE 20 MG PO TABS
60.0000 mg | ORAL_TABLET | Freq: Every day | ORAL | Status: DC
  Administered 2019-07-27 – 2019-07-29 (×4): 60 mg via ORAL
  Filled 2019-07-27 (×3): qty 3

## 2019-07-27 MED ORDER — SODIUM CHLORIDE 0.9 % IV SOLN: INTRAVENOUS | Status: AC

## 2019-07-27 MED ORDER — DOCUSATE SODIUM 100 MG PO CAPS
200.0000 mg | ORAL_CAPSULE | Freq: Two times a day (BID) | ORAL | Status: DC
  Administered 2019-07-27 (×2): 200 mg via ORAL
  Filled 2019-07-27 (×2): qty 2

## 2019-07-27 MED ORDER — PIPERACILLIN-TAZOBACTAM 3.375 G IVPB
3.3750 g | Freq: Three times a day (TID) | INTRAVENOUS | Status: DC
  Administered 2019-07-27 – 2019-07-28 (×4): 3.375 g via INTRAVENOUS
  Filled 2019-07-27 (×4): qty 50

## 2019-07-27 NOTE — Consult Note (Signed)
Jacob A. Merlene Laughter, MD     [email or web address]          Jacob Hunter is an 63 y.o. male.   ASSESSMENT/PLAN: 1.  Severe low back pain likely multifactorial including spondylosis, degenerative disc disease and muscle spasm: Imaging does not indicate infectious etiology or mass lesions. 2.  Single fever of unclear etiology but currently not febrile.  There is some suggestion of erythema involving the right lumbar paraspinous muscles suggestive of possible cellulitis ?.  Clinically the patient does not appear to have evidence of central nervous system infection and therefore additional work-up is not recommended at this time. 3.  Episodic confusion, hallucinations and hypoventilation related to the use of narcotics for pain -hydromorphone seems particularly problematic: Suggest avoiding opioids or using least effective dose.  He is on a combination of NSAIDs and gabapentin which seems fine. 4.  Polymyositis: Continue with the current regimen     The patient is a 63 year old white male who presents with severe pain over the last 3 days.  The pain is in the low back region particular the lower lumbar spinal region but he also has severe pain involving the right upper lumbar paraspinous muscle area that radiates to the right flank region.  The wife reports that the patient was in significant pain to the point that he could not stay still and decided seek medical attention.  During evaluation in the emergency room he did have a fever rectally of 103 but since then has been afebrile.  There are no reports of any precipitating factors other than for the patient lifting an object a couple days before but he did not immediately develop back pain at that time.  The patient does not report having headaches or or focal numbness or weakness.  There is no numbness or weakness of the lower extremities.  He does have a history of polymyositis diagnosed at this hospital is being followed by  neurologist at [facility].  The wife tells me he is on a good regimen does afford him to do ADLs although he has retired.  The regimen include a few immunosuppressive agents.  The patient has been given doses of opioids in the hospital for his severe back pain.  It appears this resulted in the patient becoming unresponsive with apneic events, confused and with hallucinations.  The opioids have been reduced significantly and nonopioids along with gabapentin and Toradol has been administered with much better effect.  The review of systems otherwise negative.  GENERAL: This a pleasant male who appears in some discomfort but no acute distress.  HEENT: The neck is supple no trauma appreciated.  ABDOMEN: soft  EXTREMITIES: No edema   BACK: Alignment is normal.  There is moderate to severe pain at the L4 S1 spinal area.  There is marked soreness on palpation involving the right lumbar paraspinous muscles associated with some evidence of spasm/hypertrophy.  This is associated with a mild amount of erythema.  SKIN: Normal by inspection.    MENTAL STATUS: Alert and oriented. Speech, language and cognition are generally intact. Judgment and insight normal.   CRANIAL NERVES: Pupils are equal, round and reactive to light and accomodation; extra ocular movements are full, there is no significant nystagmus; visual fields are full; upper and lower facial muscles are normal in strength and symmetric, there is no flattening of the nasolabial folds; tongue is midline; uvula is midline; shoulder elevation is normal.  MOTOR: Normal tone, bulk and strength; no  pronator drift.  COORDINATION: Left finger to nose is normal, right finger to nose is normal, No rest tremor; no intention tremor; no postural tremor; no bradykinesia.  REFLEXES: Deep tendon reflexes are symmetrical and normal.   SENSATION: Normal to pain.      Blood pressure 138/87, pulse 77, temperature 98.2 F (36.8 C), temperature  source Oral, resp. rate 13, height _0  (1.854 m), weight 108.5 kg, SpO2 100 %.  Past Medical History:  Diagnosis Date  . Arthritis   . Hypercholesteremia   . Knee pain   . Necrotizing myopathy   . Polymyositis [facility])     Past Surgical History:  Procedure Laterality Date  . ANKLE SURGERY Left   . KNEE SURGERY Left     Family History  Problem Relation Age of Onset  . Hypertension Father   . Hypercholesterolemia Father   . Hypercholesterolemia Sister   . Hypertension Mother   . Breast cancer Sister   . Colon cancer Neg Hx   . Liver disease Neg Hx     Social History:  reports that he has never smoked. He has never used smokeless tobacco. He reports that he does not drink alcohol or use drugs.  Allergies:  Allergies  Allergen Reactions  . Methylprednisolone Anaphylaxis    Rash, SOB, ?anaphylaxis?  . Other     High doses of steroids causes chest pain.   . 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 Authorizing Provider  acetaminophen (TYLENOL) 500 MG tablet Take 500 mg by mouth every 6 (six) hours as needed.   Yes [provider]  ascorbic acid (VITAMIN C) 500 MG tablet Take 1,000 mg by mouth daily.    Yes [provider]  calcium-vitamin D (OSCAL WITH D) 500-200 MG-UNIT tablet Take 1 tablet by mouth daily.   Yes [provider]  Coenzyme Q10 (CO Q 10) 10 MG CAPS Take 10 mg by mouth daily.   Yes [provider]  FLUoxetine (PROZAC) 20 MG capsule Take 20 mg by mouth daily.   Yes [provider]  folic acid (FOLVITE) 1 MG tablet Take 1 mg by mouth daily.   Yes [provider]  ibuprofen (ADVIL) 200 MG tablet Take 200 mg by mouth every 6 (six) hours as needed.   Yes [provider]  predniSONE (DELTASONE) 10 MG tablet Take 15 mg by mouth daily with  breakfast.    Yes [provider]  methotrexate (RHEUMATREX) 2.5 MG tablet Take 25 mg by mouth once a week. Caution:Chemotherapy. Protect from light.(monday) 10 tabs total    [provider]  methylPREDNISolone sodium succinate (SOLU-MEDROL) 1000 MG injection Inject 1,000 mg into the vein every 30 (thirty) days.     [provider]  riTUXimab (RITUXAN) 100 MG/10ML injection Inject into the vein.    [provider]    Scheduled Meds: . calcium-vitamin D  1 tablet Oral Daily  . Chlorhexidine Gluconate Cloth  6 each Topical Daily  . cyclobenzaprine  10 mg Oral TID  . docusate sodium  200 mg Oral BID  . enoxaparin (LOVENOX) injection  50 mg Subcutaneous Q24H  . FLUoxetine  20 mg Oral Daily  . folic acid  1 mg Oral Daily  . gabapentin  300 mg Oral TID  . ketorolac  30 mg Intravenous Q6H  . morphine  15  mg Oral Q12H  . predniSONE  60 mg Oral Q breakfast  . ascorbic acid  1,000 mg Oral Daily   Continuous Infusions: . sodium chloride 125 mL/hr at 07/27/19 1755  . piperacillin-tazobactam (ZOSYN)  IV Stopped (07/27/19 1752)   PRN Meds:.acetaminophen **OR** acetaminophen, clonazePAM, ibuprofen, ondansetron **OR** ondansetron (ZOFRAN) IV, oxyCODONE     Results for orders placed or performed during the hospital encounter of 07/25/19 (from the past 48 hour(s))  Lipase, blood     Status: None   Collection Time: 07/25/19  9:09 PM  Result Value Ref Range   Lipase 32 11 - 51 U/L    Comment: Performed at [facility], [address] [place], [place] [address]  Comprehensive metabolic panel     Status: Abnormal   Collection Time: 07/25/19  9:09 PM  Result Value Ref Range   Sodium 134 (L) 135 - 145 mmol/L   Potassium 3.9 3.5 - 5.1 mmol/L   Chloride 95 (L) 98 - 111 mmol/L   CO2 25 22 - 32 mmol/L   Glucose, Bld 141 (H) 70 - 99 mg/dL   BUN 17 8 - 23 mg/dL   Creatinine, Ser 0.88 0.61 - 1.24 mg/dL   Calcium 8.8 (L) 8.9 - 10.3 mg/dL   Total Protein 7.4 6.5 - 8.1  g/dL   Albumin 4.0 3.5 - 5.0 g/dL   AST 63 (H) 15 - 41 U/L   ALT 68 (H) 0 - 44 U/L   Alkaline Phosphatase 64 38 - 126 U/L   Total Bilirubin 1.0 0.3 - 1.2 mg/dL   GFR calc non Af Amer >60 >60 mL/min   GFR calc Af Amer >60 >60 mL/min   Anion gap 14 5 - 15    Comment: Performed at [facility], [address], [place], [place] [address]  CBC with Differential     Status: Abnormal   Collection Time: 07/25/19  9:09 PM  Result Value Ref Range   WBC 8.6 4.0 - 10.5 K/uL   RBC 4.17 (L) 4.22 - 5.81 MIL/uL   Hemoglobin 12.7 (L) 13.0 - 17.0 g/dL   HCT 40.2 39.0 - 52.0 %   MCV 96.4 80.0 - 100.0 fL   MCH 30.5 26.0 - 34.0 pg   MCHC 31.6 30.0 - 36.0 g/dL   RDW 15.9 (H) 11.5 - 15.5 %   Platelets 156 150 - 400 K/uL   nRBC 0.0 0.0 - 0.2 %   Neutrophils Relative % 85 %   Neutro Abs 7.2 1.7 - 7.7 K/uL   Lymphocytes Relative 4 %   Lymphs Abs 0.3 (L) 0.7 - 4.0 K/uL   Monocytes Relative 9 %   Monocytes Absolute 0.8 0.1 - 1.0 K/uL   Eosinophils Relative 1 %   Eosinophils Absolute 0.1 0.0 - 0.5 K/uL   Basophils Relative 0 %   Basophils Absolute 0.0 0.0 - 0.1 K/uL   Immature Granulocytes 1 %   Abs Immature Granulocytes 0.12 (H) 0.00 - 0.07 K/uL    Comment: Performed at [facility], [address], [place], [place] [address]  CK     Status: Abnormal   Collection Time: 07/25/19  9:09 PM  Result Value Ref Range   Total CK 680 (H) 49 - 397 U/L    Comment: Performed at [facility], [address] [place], [place] [address]  Urinalysis, Routine w reflex microscopic     Status: Abnormal   Collection Time: 07/26/19 12:06 AM  Result Value Ref Range   Color, Urine  YELLOW YELLOW   APPearance CLEAR CLEAR   Specific Gravity, Urine >1.046 (H) 1.005 - 1.030   pH 6.0 5.0 - 8.0   Glucose, UA NEGATIVE NEGATIVE mg/dL   Hgb urine dipstick NEGATIVE NEGATIVE   Bilirubin Urine NEGATIVE NEGATIVE   Ketones, ur 5 (A) NEGATIVE mg/dL   Protein, ur NEGATIVE NEGATIVE mg/dL   Nitrite NEGATIVE NEGATIVE   Leukocytes,Ua  NEGATIVE NEGATIVE    Comment: Performed at [facility], [address] [place], [place] [address]  CBG monitoring, ED     Status: Abnormal   Collection Time: 07/26/19  1:03 AM  Result Value Ref Range   Glucose-Capillary 117 (H) 70 - 99 mg/dL  POC SARS Coronavirus 2 Ag-ED - Nasal Swab (BD Veritor Kit)     Status: None   Collection Time: 07/26/19  3:38 AM  Result Value Ref Range   SARS Coronavirus 2 Ag NEGATIVE NEGATIVE    Comment: (NOTE) SARS-CoV-2 antigen NOT DETECTED.  Negative results are presumptive.  Negative results do not preclude SARS-CoV-2 infection and should not be used as the sole basis for treatment or other patient management decisions, including infection  control decisions, particularly in the presence of clinical signs and  symptoms consistent with COVID-19, or in those who have been in contact with the virus.  Negative results must be combined with clinical observations, patient history, and epidemiological information. The expected result is Negative. Fact Sheet for Patients: PodPark.tn Fact Sheet for Healthcare Providers: GiftContent.is This test is not yet approved or cleared by the [place] FDA and  has been authorized for detection and/or diagnosis of SARS-CoV-2 by FDA under an Emergency Use Authorization (EUA).  This EUA will remain in effect (meaning this test can be used) for the duration of  the COVID-19 de claration under Section 564(b)(1) of the Act, 21 U.S.C. section 360bbb-3(b)(1), unless the authorization is terminated or revoked sooner.   SARS CORONAVIRUS 2 (TAT 6-24 HRS) Nasopharyngeal Nasopharyngeal Swab     Status: None   Collection Time: 07/26/19  3:43 AM   Specimen: Nasopharyngeal Swab  Result Value Ref Range   SARS Coronavirus 2 NEGATIVE NEGATIVE    Comment: (NOTE) SARS-CoV-2 target nucleic acids are NOT DETECTED. The SARS-CoV-2 RNA is generally detectable in upper and  lower respiratory specimens during the acute phase of infection. Negative results do not preclude SARS-CoV-2 infection, do not rule out co-infections with other pathogens, and should not be used as the sole basis for treatment or other patient management decisions. Negative results must be combined with clinical observations, patient history, and epidemiological information. The expected result is Negative. Fact Sheet for Patients: SugarRoll.be Fact Sheet for Healthcare Providers: https://www.woods-mathews.com/ This test is not yet approved or cleared by the [place] FDA and  has been authorized for detection and/or diagnosis of SARS-CoV-2 by FDA under an Emergency Use Authorization (EUA). This EUA will remain  in effect (meaning this test can be used) for the duration of the COVID-19 declaration under Section 56 4(b)(1) of the Act, 21 U.S.C. section 360bbb-3(b)(1), unless the authorization is terminated or revoked sooner. Performed at [facility], [place] [address] [place], [place] [address]   HIV Antibody (routine testing w rflx)     Status: None   Collection Time: 07/26/19  5:59 AM  Result Value Ref Range   HIV Screen 4th Generation wRfx NON REACTIVE NON REACTIVE    Comment: Performed at [facility] [address], [place], [place] [address]  CBC  Status: Abnormal   Collection Time: 07/26/19  5:59 AM  Result Value Ref Range   WBC 9.3 4.0 - 10.5 K/uL   RBC 3.63 (L) 4.22 - 5.81 MIL/uL   Hemoglobin 11.2 (L) 13.0 - 17.0 g/dL   HCT 34.2 (L) 39.0 - 52.0 %   MCV 94.2 80.0 - 100.0 fL   MCH 30.9 26.0 - 34.0 pg   MCHC 32.7 30.0 - 36.0 g/dL   RDW 15.9 (H) 11.5 - 15.5 %   Platelets 126 (L) 150 - 400 K/uL   nRBC 0.0 0.0 - 0.2 %    Comment: Performed at [facility], [address], [place], [place] [address]  Comprehensive metabolic panel     Status: Abnormal   Collection Time: 07/26/19  5:59 AM  Result Value Ref Range    Sodium 133 (L) 135 - 145 mmol/L   Potassium 3.3 (L) 3.5 - 5.1 mmol/L   Chloride 101 98 - 111 mmol/L   CO2 23 22 - 32 mmol/L   Glucose, Bld 105 (H) 70 - 99 mg/dL   BUN 14 8 - 23 mg/dL   Creatinine, Ser 0.80 0.61 - 1.24 mg/dL   Calcium 7.9 (L) 8.9 - 10.3 mg/dL   Total Protein 6.0 (L) 6.5 - 8.1 g/dL   Albumin 3.3 (L) 3.5 - 5.0 g/dL   AST 67 (H) 15 - 41 U/L   ALT 69 (H) 0 - 44 U/L   Alkaline Phosphatase 53 38 - 126 U/L   Total Bilirubin 1.1 0.3 - 1.2 mg/dL   GFR calc non Af Amer >60 >60 mL/min   GFR calc Af Amer >60 >60 mL/min   Anion gap 9 5 - 15    Comment: Performed at [facility], [address], [place], [place] [address]  Blood gas, arterial     Status: Abnormal   Collection Time: 07/26/19  8:06 PM  Result Value Ref Range   FIO2 28.00    pH, Arterial 7.566 (H) 7.350 - 7.450   pCO2 arterial 23.0 (L) 32.0 - 48.0 mmHg   pO2, Arterial 74.6 (L) 83.0 - 108.0 mmHg   Bicarbonate 24.7 20.0 - 28.0 mmol/L   Acid-base deficit 1.2 0.0 - 2.0 mmol/L   O2 Saturation 96.6 %   Patient temperature 37.0    Allens test (pass/fail) PASS PASS    Comment: Performed at [facility], [address], [place], [place] [address]  Lactic acid, plasma     Status: Abnormal   Collection Time: 07/27/19  9:27 AM  Result Value Ref Range   Lactic Acid, Venous 2.2 (HH) 0.5 - 1.9 mmol/L    Comment: CRITICAL RESULT CALLED TO, READ BACK BY AND VERIFIED WITH: BASS,V AT 10:00AM ON 07/27/19 BY [facility] Performed at [facility], [address] [place], [place] [address]   Lactic acid, plasma     Status: None   Collection Time: 07/27/19 11:49 AM  Result Value Ref Range   Lactic Acid, Venous 1.2 0.5 - 1.9 mmol/L    Comment: Performed at [facility], [address], [place], [place] [address]  Procalcitonin - Baseline     Status: None   Collection Time: 07/27/19 11:49 AM  Result Value Ref Range   Procalcitonin 0.64 ng/mL    Comment:        Interpretation: PCT > 0.5 ng/mL and <= 2 ng/mL: Systemic infection  (sepsis) is possible, but other conditions are known to elevate PCT as well. (NOTE)       Sepsis PCT  Algorithm           Lower Respiratory Tract                                      Infection PCT Algorithm    ----------------------------     ----------------------------         PCT < 0.25 ng/mL                PCT < 0.10 ng/mL         Strongly encourage             Strongly discourage   discontinuation of antibiotics    initiation of antibiotics    ----------------------------     -----------------------------       PCT 0.25 - 0.50 ng/mL            PCT 0.10 - 0.25 ng/mL               OR       >80% decrease in PCT            Discourage initiation of                                            antibiotics      Encourage discontinuation           of antibiotics    ----------------------------     -----------------------------         PCT >= 0.50 ng/mL              PCT 0.26 - 0.50 ng/mL                AND       <80% decrease in PCT             Encourage initiation of                                             antibiotics       Encourage continuation           of antibiotics    ----------------------------     -----------------------------        PCT >= 0.50 ng/mL                  PCT > 0.50 ng/mL               AND         increase in PCT                  Strongly encourage                                      initiation of antibiotics    Strongly encourage escalation           of antibiotics                                     -----------------------------  PCT <= 0.25 ng/mL                                                 OR                                        > 80% decrease in PCT                                     Discontinue / Do not initiate                                             antibiotics Performed at [facility], [address] [place], [place] [address]     Studies/Results:   L SPINE MRI  FINDINGS: Segmentation:   Standard.  Alignment:  Maintained.  Vertebrae:  No fracture, evidence of discitis, or bone lesion.  Conus medullaris and cauda equina: Conus extends to the L1 level. Conus and cauda equina appear normal.  Paraspinal and other soft tissues: Negative.  Disc levels:  T11-12 and T12-L1 are imaged in the sagittal plane only and negative.  L1-2: Negative.  L2-3: There is a disc bulge and ligamentum flavum thickening causing moderate central canal stenosis, increased compared to the prior MRI. Foramina are open.  L3-4: Moderate facet arthropathy, broad-based disc bulge and mild ligamentum flavum thickening. Moderate central canal stenosis and mild to moderate foraminal narrowing, worse on the right. No change compared to the prior MRI.  L4-5: Central and eccentric to the left disc protrusion causes moderate central canal and left worse than right subarticular recess narrowing. Neural foramina are open. No change.  L5-S1: Shallow broad-based right paracentral protrusion slightly indents the ventral thecal sac and causes mild narrowing in the subarticular recesses. Mild bilateral foraminal narrowing. No change.  IMPRESSION: 1. No change in the appearance of the lumbar spine since the prior MRI. 2. Moderate central canal stenosis at L2-3 due to a disc bulge and ligamentum flavum thickening is increased compared to the prior MRI. 3. No change in moderate central canal stenosis and mild to moderate foraminal narrowing at L3-4. 4. No change in moderate central canal and left worse than right subarticular recess narrowing at L4-5. 5. No change in a shallow broad-based right paracentral protrusion at L5-S1 causes mild narrowing in the subarticular recesses.    CTA CHEST ABD IMPRESSION: CTA of the chest: No aneurysmal dilatation or dissection of the aorta is noted. The pulmonary artery as visualized is within normal limits.  No other focal abnormality is  noted.  CTA of the abdomen and pelvis: Normal-appearing abdominal aorta.  Nonobstructing right renal stones.  Diverticular disease without diverticulitis.  Stable small hepatic cysts.       Melenie Minniear A. Merlene Hunter, M.D.  Diplomate, Tax adviser of Psychiatry and Neurology ( Neurology). 07/27/2019, 6:04 PM

## 2019-07-27 NOTE — Progress Notes (Signed)
Patient arrived to unit from 300 unit at around 1445. Patient alert upon verbal stimuli and oriented x4. Currently denies pain. Patient attempted to void in urinal with no success. Bladder scan performed with result of 415ml in the bladder. Dr. Roger Shelter made aware and in and out cath done with 782ml output total. Per Dr. Roger Shelter: please monitor and check again in 4-6 hrs if > 500 ml, pls place a Foleycath, to be removed in 24 hrs.   @1745 -patient has been voiding in urinal (clear, yellow).

## 2019-07-27 NOTE — Progress Notes (Addendum)
PROGRESS NOTE    Patient: Jacob Hunter                            PCP: Asencion Noble, MD                    DOB: 09-12-1955            DOA: 07/25/2019 IDP:[id number]             DOS: 07/27/2019, 8:33 AM   LOS: 1 day   Date of Service: The patient was seen and examined on 07/27/2019  Subjective:   The patient was seen and examined this morning, awake alert, uncomfortable in bed, unable to find a comfortable position. Anxious.  Wife present at bedside.  Overnight rapid response was called as he became confused, was hallucinating, was hyperventilating, patient was given a dose of Toradol, Ativan... Refill required 4 L of oxygen, then was weaned down to 2 L of oxygen.  The findings plan of care current pain management was discussed with the patient and his wife at bedside in detail.  I have informed the patient and his wife that neurosurgery Dr. Trenton Gammon and his PA was notified yesterday the case was discussed in detail.  The recommending no surgical intervention at this time, recommending high-dose steroids pain management PT/OT.  Also neurology will be consulted for further assessment and recommendations    Brief Narrative:   63 y.o. male, with history of necrotizing myopathy, on chronic prednisone 15 mg daily, 1 g Solu-Medrol every 4 weeks, Rituxan every 6 months, polymyositis, hyperlipidemia who came to hospital with complaints of abdominal pain and back pain for past 3 days.   As per patient wife this started after patient lifted up a heavy dog food bag.  Patient said that the pain has been persistent, get worse on movement.  It is not associated with nausea vomiting or diarrhea.  Denies chest pain or shortness of breath.  He does have history of spinal stenosis.  MRI lumbar spine from 2016 showed moderate spinal stenosis L3-L4, L4-5 central disc protrusion with moderate spinal stenosis, small central disc protrusion at L5-S1 without neural impingement. He denies fever or chills.     ED he had a temperature of 103.1.  He was hypoxic.  Initial Covid antigen test was negative. SARS-CoV-2 RT-PCR 6-24 is currently pending. CT angio chest abdomen and pelvis was obtained in the ED which is unremarkable.  07/26/2019:still complaining of intractable back pain, unable to get comfortable on lay back flat,  not associated with paresthesia or weakness Pending MRI of the back    09/26/2018: Overnight: Rapid response due to confusion, hallucination,.  Responded well to Toradol, Ativan.  If hypoxia required 4 L now 2 L of oxygen Pain medications have been modified.  -Neurosurgery was consulted on 09/25/2018 . Dr. Susy Manor has reviewed all records and imaging recommending no surgical intervention.  Continue pain management PT/OT/high-dose steroids His neurologist at [facility] Dr. Gustavus Bryant, [phone] was called Recommended further evaluation, because of pain, agreed with current treatment plan. Neurology also consulted today.  After speaking to her neurologist, treating apparently with antibiotics as patient immunocompromised, mildly elevated lactic acid, low-grade fever  Assessment & Plan:   Principal Problem:   Spinal stenosis Active Problems:   Back pain   Polymyositis (HCC)  Intractable back pain -with anxiety -Patient remains uncomfortable, still in pain, unable to find a comfortable position -Overnight likely due  to pain medication had arrest with response called due to hallucination, confusion -still denies having any paresthesia, radiation of pain to buttocks or lower extremities -History of a spinal stenosis at L3-4, L5-S1, with disc protrusion -Current pain management: Scheduled MS Contin 15 mg every 12, As needed OxyIR 5 mg every 6 hrs, as needed Toradol 30 every 6 -Have increased his Neurontin to 300 mg p.o. 3 times daily, scheduled 0.5 Klonopin 3 times daily -PT/OT consulted for evaluation recommendation -MRI of spine was reviewed: Moderate central  canal stenosis at L2-3 due to a disc bulge and ligamentum flavum thickening is increased compared to the prior MRI. Otherwise no other acute changes -Neurosurgery: Dr. Susy Manor was consulted. They have reviewed records.  Recommending no surgical intervention at this time.  Recommending pain management, PT/OT. Neurology will be consulted for further evaluation and assist  His neurologist at [facility] Dr. Gustavus Bryant, [phone] was called Recommended further evaluation, because of pain, agreed with current treatment plan.   Low-grade fever, mild hypoxia -Upon arrival one reading of temp 100.1, PT temp 99, currently 98 -, Blood pressure mildly elevated, afebrile -SARS-CoV-2 negative -Chest x-ray UA negative -CTA chest/abdomen -no for PE, or aortic aneurysm  -would hold antibiotics at this time -monitor closely for any signs of infection -After speaking with his primary neurologist due to patient being immune compromised, low-grade fever, mildly elevated lactic acid -patient empiric antibiotic of Zosyn  Polymyositis/necrotizing myopathy -patient has chronically elevated CPK.  Continue prednisone 15 mg daily,  - patient got Solu-Medrol 1000 mg every 4 weeks, Rituxan 375 mg/m weekly x4 every 6 months, methotrexate 25 mg weekly.  -we will continue with prednisone 15 mg daily.   - CPK is 600, review of the records from [facility] showed that patient's CPK is chronically elevated in 400s.   Cultures; 07/27/2019 blood cultures  Antimicrobials: 07/27/2019 Zosyn >>   DVT prophylaxis: SCD/Compression stockings and Lovenox SQ Code Status:   Code Status: Full Code Family Communication: Wife present at bedside, updated.  Patient's primary neurologist updated The above findings and plan of care has been discussed with patient and his wife in detail,  they expressed understanding and agreement of above. Disposition Plan:   Anticipated 1-2 days Admission status:  NPATIEN -    Consultants:  [facility] Dr. Trenton Gammon Neurologist Patient's neurologist at [facility] Dr. Gustavus Bryant   CTA chest/abdomen:  IMPRESSION: CTA of the chest: No aneurysmal dilatation or dissection of the aorta is noted. The pulmonary artery as visualized is within normal limits. No other focal abnormality is noted. CTA of the abdomen and pelvis: Normal-appearing abdominal aorta. Nonobstructing right renal stones. Diverticular disease without diverticulitis. Stable small hepatic cysts.   MRI lumbar spine:  IMPRESSION: 1. No change in the appearance of the lumbar spine since the prior MRI. 2. Moderate central canal stenosis at L2-3 due to a disc bulge and ligamentum flavum thickening is increased compared to the prior MRI. 3. No change in moderate central canal stenosis and mild to moderate foraminal narrowing at L3-4. 4. No change in moderate central canal and left worse than right subarticular recess narrowing at L4-5. 5. No change in a shallow broad-based right paracentral protrusion at L5-S1 causes mild narrowing in the subarticular recesses.    Procedures:   No admission procedures for hospital encounter.     Antimicrobials:  Anti-infectives (From admission, onward)   None       Medication:  . calcium-vitamin D  1 tablet Oral Daily  .  clonazePAM  0.5 mg Oral BID  . cyclobenzaprine  10 mg Oral TID  . enoxaparin (LOVENOX) injection  50 mg Subcutaneous Q24H  . FLUoxetine  20 mg Oral Daily  . folic acid  1 mg Oral Daily  . gabapentin  300 mg Oral TID  . ketorolac  30 mg Intravenous Q6H  . LORazepam  0.5 mg Intravenous Q6H  . morphine  15 mg Oral Q12H  . [START ON 07/28/2019] predniSONE  60 mg Oral Q breakfast  . ascorbic acid  1,000 mg Oral Daily    acetaminophen **OR** acetaminophen, HYDROmorphone (DILAUDID) injection, ibuprofen, ondansetron **OR** ondansetron (ZOFRAN) IV, oxyCODONE   Objective:   Vitals:   07/26/19 1900  07/26/19 1959 07/26/19 2021 07/27/19 0458  BP:  (!) 152/73  (!) 154/94  Pulse:  88  85  Resp:  20  16  Temp:    97.8 F (36.6 C)  TempSrc:    Oral  SpO2: 100% 100% 100% 100%  Weight:      Height:        Intake/Output Summary (Last 24 hours) at 07/27/2019 0833 Last data filed at 07/27/2019 0300 Gross per 24 hour  Intake 1588.75 ml  Output 2000 ml  Net -411.25 ml   Filed Weights   07/25/19 1941  Weight: 106.1 kg     Examination:  BP (!) 154/94 (BP Location: Right Arm)   Pulse 85   Temp 97.8 F (36.6 C) (Oral)   Resp 16   Ht 6\' 1"  (9.767 m)   Wt 106.1 kg   SpO2 100%   BMI 30.87 kg/m    Physical Exam  Constitution: Awake alert very uncomfortable complaining of back pain, unable to find a comfortable position -able to move all 4 extremities   HEENT: Normocephalic, PERRL, otherwise with in Normal limits  Chest:Chest symmetric Cardio vascular:  S1/S2, RRR, No murmure, No Rubs or Gallops  pulmonary: Clear to auscultation bilaterally, respirations unlabored, negative wheezes / crackles Abdomen: Soft, non-tender, non-distended, bowel sounds,no masses, no organomegaly Muscular skeletal: Limited exam - in bed, able to move all 4 extremities, Normal strength,  Neuro: CNII-XII intact. , normal motor and sensation, reflexes intact  Extremities: No pitting edema lower extremities, +2 pulses  Skin: Dry, warm to touch, negative for any Rashes, No open wounds Wounds: per nursing documentation     LABs:  CBC Latest Ref Rng & Units 07/26/2019 07/25/2019 10/14/2015  WBC 4.0 - 10.5 K/uL 9.3 8.6 -  Hemoglobin 13.0 - 17.0 g/dL 11.2(L) 12.7(L) 14.3  Hematocrit 39.0 - 52.0 % 34.2(L) 40.2 42.0  Platelets 150 - 400 K/uL 126(L) 156 -   CMP Latest Ref Rng & Units 07/26/2019 07/25/2019 10/14/2015  Glucose 70 - 99 mg/dL 105(H) 141(H) 95  BUN 8 - 23 mg/dL 14 17 20   Creatinine 0.61 - 1.24 mg/dL 0.80 0.88 0.80  Sodium 135 - 145 mmol/L 133(L) 134(L) 141  Potassium 3.5 - 5.1 mmol/L 3.3(L) 3.9  4.5  Chloride 98 - 111 mmol/L 101 95(L) 101  CO2 22 - 32 mmol/L 23 25 -  Calcium 8.9 - 10.3 mg/dL 7.9(L) 8.8(L) -  Total Protein 6.5 - 8.1 g/dL 6.0(L) 7.4 -  Total Bilirubin 0.3 - 1.2 mg/dL 1.1 1.0 -  Alkaline Phos 38 - 126 U/L 53 64 -  AST 15 - 41 U/L 67(H) 63(H) -  ALT 0 - 44 U/L 69(H) 68(H) -    SIGNED: Deatra James, MD, FACP, FHM. [facility],  Pager [phone]  If  7PM-7AM, please contact night-coverage [email or web address] 07/27/2019, 8:33 AM

## 2019-07-28 ENCOUNTER — Inpatient Hospital Stay (HOSPITAL_COMMUNITY): Payer: PPO

## 2019-07-28 LAB — URINALYSIS, ROUTINE W REFLEX MICROSCOPIC
Bilirubin Urine: NEGATIVE
Glucose, UA: NEGATIVE mg/dL
Hgb urine dipstick: NEGATIVE
Ketones, ur: NEGATIVE mg/dL
Leukocytes,Ua: NEGATIVE
Nitrite: NEGATIVE
Protein, ur: NEGATIVE mg/dL
Specific Gravity, Urine: 1.008 (ref 1.005–1.030)
pH: 5 (ref 5.0–8.0)

## 2019-07-28 LAB — MRSA PCR SCREENING: MRSA by PCR: NEGATIVE

## 2019-07-28 MED ORDER — TAMSULOSIN HCL 0.4 MG PO CAPS: 0.4000 mg | ORAL_CAPSULE | Freq: Two times a day (BID) | ORAL | Status: DC

## 2019-07-28 MED ORDER — SODIUM CHLORIDE 0.9 % IV BOLUS
500.0000 mL | Freq: Once | INTRAVENOUS | Status: AC
  Administered 2019-07-28: 500 mL via INTRAVENOUS

## 2019-07-28 MED ORDER — LOPERAMIDE HCL 2 MG PO CAPS
4.0000 mg | ORAL_CAPSULE | Freq: Once | ORAL | Status: AC
  Administered 2019-07-28: 4 mg via ORAL
  Filled 2019-07-28: qty 2

## 2019-07-28 MED ORDER — SODIUM CHLORIDE 0.9 % IV SOLN: INTRAVENOUS | Status: DC

## 2019-07-28 MED ORDER — CALCIUM CARBONATE-VITAMIN D 500-200 MG-UNIT PO TABS
1.0000 | ORAL_TABLET | Freq: Every day | ORAL | Status: DC
  Filled 2019-07-28: qty 1

## 2019-07-28 MED ORDER — SODIUM CHLORIDE 0.9 % IV SOLN
1.0000 g | INTRAVENOUS | Status: DC
  Administered 2019-07-28: 19:00:00 1 g via INTRAVENOUS
  Filled 2019-07-28: qty 10

## 2019-07-28 MED ORDER — TAMSULOSIN HCL 0.4 MG PO CAPS
0.4000 mg | ORAL_CAPSULE | Freq: Every day | ORAL | Status: DC
  Administered 2019-07-28 – 2019-07-29 (×2): 0.4 mg via ORAL
  Filled 2019-07-28 (×2): qty 1

## 2019-07-28 MED ORDER — AZITHROMYCIN 250 MG PO TABS
500.0000 mg | ORAL_TABLET | Freq: Every day | ORAL | Status: DC
  Administered 2019-07-29: 10:00:00 500 mg via ORAL
  Filled 2019-07-28: qty 2

## 2019-07-28 MED ORDER — CALCIUM CARBONATE-VITAMIN D 500-200 MG-UNIT PO TABS
1.0000 | ORAL_TABLET | Freq: Every day | ORAL | Status: DC
  Administered 2019-07-28 – 2019-07-29 (×2): 1 via ORAL
  Filled 2019-07-28: qty 1

## 2019-07-28 NOTE — Progress Notes (Signed)
PT Cancellation Note  Patient Details Name: Jacob Hunter MRN: [id number] DOB: March 25, 1956   Cancelled Treatment:    Reason Eval/Treat Not Completed: Medical issues which prohibited therapy.  Patient transferred to a higher level of care and will need new PT consult resume therapy when patient is medically stable.  Thank you.    8:41 AM, 07/28/19 Lonell Grandchild, MPT Physical Therapist with [facility] [phone] office [phone] mobile phone

## 2019-07-28 NOTE — Progress Notes (Signed)
PROGRESS NOTE    Patient: Jacob Hunter                            PCP: No primary care provider on file.                    DOB: 1956/06/22            DOA: 07/25/2019 [id number]             DOS: 07/28/2019, 6:26 PM   LOS: 2 days   Date of Service: The patient was seen and examined on 07/28/2019  Subjective:   07/28/2019 No further fevers, patient with multiple episodes of diarrhea and transient hypotension requiring IV fluids -Stools are watery without mucus or blood -No vomiting -Back pain improving   Brief Narrative:   63 y.o. male, with history of necrotizing myopathy, on chronic prednisone 15 mg daily, 1 g Solu-Medrol every 4 weeks, Rituxan every 6 months, polymyositis, hyperlipidemia who came to hospital with complaints of abdominal pain and back pain for past 3 days.   As per patient wife this started after patient lifted up a heavy dog food bag.  Patient said that the pain has been persistent, get worse on movement.  It is not associated with nausea vomiting or diarrhea.  Denies chest pain or shortness of breath.  He does have history of spinal stenosis.  MRI lumbar spine from 2016 showed moderate spinal stenosis L3-L4, L4-5 central disc protrusion with moderate spinal stenosis, small central disc protrusion at L5-S1 without neural impingement. He denies fever or chills.    ED he had a temperature of 103.1.  He was hypoxic.  Initial Covid antigen test was negative. SARS-CoV-2 RT-PCR 6-24 is currently pending. CT angio chest abdomen and pelvis was obtained in the ED which is unremarkable.  07/26/2019:still complaining of intractable back pain, unable to get comfortable on lay back flat,  not associated with paresthesia or weakness Pending MRI of the back  09/26/2018: Overnight: Rapid response due to confusion, hallucination,.  Responded well to Toradol, Ativan.  If hypoxia required 4 L now 2 L of oxygen Pain medications have been modified.  -Neurosurgery was  consulted on 09/25/2018 . Dr. Susy Manor has reviewed all records and imaging recommending no surgical intervention.  Continue pain management PT/OT/high-dose steroids His neurologist at [facility] Dr. Gustavus Bryant, [phone] was called Recommended further evaluation, because of pain, agreed with current treatment plan. Neurology also consulted today.  Dr Roger Shelter spoke with his neurologist, treating apparently with antibiotics as patient immunocompromised, mildly elevated lactic acid, low-grade fever and elevated procalcitonin  Assessment & Plan:   Principal Problem:   Spinal stenosis Active Problems:   Back pain   Polymyositis (HCC)  Intractable back pain -with anxiety -Much less anxious, -Back pain improving -still denies having any paresthesia, radiation of pain to buttocks or lower extremities -History of a spinal stenosis at L3-4, L5-S1, with disc protrusion -Current pain management: Scheduled MS Contin 15 mg every 12, As needed OxyIR 5 mg every 6 hrs, as needed Toradol 30 every 6 -Have increased his Neurontin to 300 mg p.o. 3 times daily, scheduled 0.5 Klonopin 3 times daily -PT/OT consulted for evaluation recommendation -MRI of spine was reviewed: Moderate central canal stenosis at L2-3 due to a disc bulge and ligamentum flavum thickening is increased compared to the prior MRI. Otherwise no other acute changes -Neurosurgery: Dr. Susy Manor was consulted. They have reviewed records.  Recommending no surgical intervention at this time.  Recommending pain management, PT/OT. Neurology will be consulted for further evaluation and assist  His neurologist at [facility] Dr. Gustavus Bryant, [phone] was called Recommended further evaluation, because of pain, agreed with current treatment plan.   Left lower lobe pneumonia -patient had low-grade fever, mild hypoxia -Upon arrival one reading of temp 100.1, PT temp 99, currently 98 -, Blood pressure mildly elevated,  afebrile -SARS-CoV-2 negative -UA negative -CTA chest/abdomen -no for PE, or aortic aneurysm  -Chest x-ray with suspicion of left-sided pneumonia-patient with elevated procalcitonin, elevated lactic acid --okay to de-escalate from Zosyn treat CAP with Rocephin and Zithromax  -After speaking with his primary neurologist due to patient being immune compromised, low-grade fever, mildly elevated lactic acid -patient empiric antibiotic  --- Diarrhea-- ???  Antibiotic induced,--- no fevers, stools without blood or mucus, low clinical index of suspicion for C. Difficile  --- Transient hypotension--- due to multiple episodes of diarrhea with loose stools and volume loss--- requiring IV fluids for BP  Polymyositis/necrotizing myopathy -patient has chronically elevated CPK.  Continue prednisone 15 mg daily,  - patient got Solu-Medrol 1000 mg every 4 weeks, Rituxan 375 mg/m weekly x4 every 6 months, methotrexate 25 mg weekly.  -we will continue with prednisone 15 mg daily.   - CPK is 600, review of the records from [facility] showed that patient's CPK is chronically elevated in 400s.   Cultures; 07/27/2019 blood cultures  Antimicrobials: 07/27/2019 Zosyn >>   DVT prophylaxis: SCD/Compression stockings and Lovenox SQ Code Status:   Code Status: Full Code Family Communication: Wife present at bedside, updated.  Patient's primary neurologist updated The above findings and plan of care has been discussed with patient and his wife in detail,  they expressed understanding and agreement of above. Disposition Plan:   Anticipated 1-2 days Admission status:  NPATIEN -   Consultants:  [facility] Dr. Trenton Gammon Neurologist Patient's neurologist at [facility] Dr. Gustavus Bryant   CTA chest/abdomen:  IMPRESSION: CTA of the chest: No aneurysmal dilatation or dissection of the aorta is noted. The pulmonary artery as visualized is within normal limits. No other focal abnormality is  noted. CTA of the abdomen and pelvis: Normal-appearing abdominal aorta. Nonobstructing right renal stones. Diverticular disease without diverticulitis. Stable small hepatic cysts.   MRI lumbar spine:  IMPRESSION: 1. No change in the appearance of the lumbar spine since the prior MRI. 2. Moderate central canal stenosis at L2-3 due to a disc bulge and ligamentum flavum thickening is increased compared to the prior MRI. 3. No change in moderate central canal stenosis and mild to moderate foraminal narrowing at L3-4. 4. No change in moderate central canal and left worse than right subarticular recess narrowing at L4-5. 5. No change in a shallow broad-based right paracentral protrusion at L5-S1 causes mild narrowing in the subarticular recesses.    Procedures:   No admission procedures for hospital encounter.     Antimicrobials:  Anti-infectives (From admission, onward)   Start     Dose/Rate Route Frequency Ordered Stop   07/28/19 1830  cefTRIAXone (ROCEPHIN) 1 g in sodium chloride 0.9 % 100 mL IVPB     1 g 200 mL/hr over 30 Minutes Intravenous Every 24 hours 07/28/19 1825     07/28/19 1830  azithromycin (ZITHROMAX) tablet 500 mg     500 mg Oral Daily 07/28/19 1825     07/27/19 1400  piperacillin-tazobactam (ZOSYN) IVPB 3.375 g  Status:  Discontinued  3.375 g 12.5 mL/hr over 240 Minutes Intravenous Every 8 hours 07/27/19 1008 07/28/19 1825       Medication:  . azithromycin  500 mg Oral Daily  . calcium-vitamin D  1 tablet Oral Q breakfast  . Chlorhexidine Gluconate Cloth  6 each Topical Daily  . cyclobenzaprine  10 mg Oral TID  . enoxaparin (LOVENOX) injection  50 mg Subcutaneous Q24H  . FLUoxetine  20 mg Oral Daily  . folic acid  1 mg Oral Daily  . gabapentin  300 mg Oral TID  . ketorolac  30 mg Intravenous Q6H  . morphine  15 mg Oral Q12H  . predniSONE  60 mg Oral Q breakfast  . tamsulosin  0.4 mg Oral QPC supper  . ascorbic acid  1,000 mg Oral Daily     acetaminophen **OR** acetaminophen, clonazePAM, ondansetron **OR** ondansetron (ZOFRAN) IV, oxyCODONE   Objective:   Vitals:   07/28/19 1637 07/28/19 1700 07/28/19 1730 07/28/19 1800  BP: 101/65 105/64 97/63 96/61   Pulse: 79 77 82 78  Resp: 11 11 11 13   Temp:      TempSrc:      SpO2: 94% 95% 96% 98%  Weight:      Height:        Intake/Output Summary (Last 24 hours) at 07/28/2019 1826 Last data filed at 07/28/2019 1532 Gross per 24 hour  Intake 682.51 ml  Output 1225 ml  Net -542.49 ml   Filed Weights   07/25/19 1941 07/27/19 1448 07/28/19 0500  Weight: 106.1 kg 108.5 kg 110 kg     Examination:  BP 96/61   Pulse 78   Temp (!) 97 F (36.1 C) (Oral)   Resp 13   Ht 6\' 1"  (1.854 m)   Wt 110 kg   SpO2 98%   BMI 31.99 kg/m    Physical Exam  Constitution: Awake alert  HEENT: Normocephalic, PERRL, otherwise with in Normal limits  Chest:Chest symmetric Cardio vascular:  S1/S2, RRR, No murmure, No Rubs or Gallops  pulmonary: Clear to auscultation bilaterally, respirations unlabored, negative wheezes / crackles Abdomen: Soft, non-tender, non-distended, bowel sounds,no masses, no organomegaly Muscular skeletal:  Improving lumbar range of motion  -Straight leg raising test negative -neuro: CNII-XII intact. , normal motor and sensation, reflexes intact  Extremities: No pitting edema lower extremities, +2 pulses  Skin: Dry, warm to touch, negative for any Rashes, No open wounds      LABs:  CBC Latest Ref Rng & Units 07/26/2019 07/25/2019 10/14/2015  WBC 4.0 - 10.5 K/uL 9.3 8.6 -  Hemoglobin 13.0 - 17.0 g/dL 11.2(L) 12.7(L) 14.3  Hematocrit 39.0 - 52.0 % 34.2(L) 40.2 42.0  Platelets 150 - 400 K/uL 126(L) 156 -   CMP Latest Ref Rng & Units 07/26/2019 07/25/2019 10/14/2015  Glucose 70 - 99 mg/dL 105(H) 141(H) 95  BUN 8 - 23 mg/dL 14 17 20   Creatinine 0.61 - 1.24 mg/dL 0.80 0.88 0.80  Sodium 135 - 145 mmol/L 133(L) 134(L) 141  Potassium 3.5 - 5.1 mmol/L 3.3(L) 3.9  4.5  Chloride 98 - 111 mmol/L 101 95(L) 101  CO2 22 - 32 mmol/L 23 25 -  Calcium 8.9 - 10.3 mg/dL 7.9(L) 8.8(L) -  Total Protein 6.5 - 8.1 g/dL 6.0(L) 7.4 -  Total Bilirubin 0.3 - 1.2 mg/dL 1.1 1.0 -  Alkaline Phos 38 - 126 U/L 53 64 -  AST 15 - 41 U/L 67(H) 63(H) -  ALT 0 - 44 U/L 69(H) 68(H) -    SIGNED:  Roxan Hockey, MD,   [facility],    If 7PM-7AM, please contact night-coverage [email or web address] 07/28/2019, 6:26 PM

## 2019-07-28 NOTE — Progress Notes (Signed)
Physical Therapy Treatment Patient Details Name: Jacob Hunter MRN: [id number] DOB: 13-Jan-1956 Today's Date: 07/28/2019    History of Present Illness Jacob Hunter  is a 63 y.o. male, with history of necrotizing myopathy, on chronic prednisone 15 mg daily, 1 g Solu-Medrol every 4 weeks, Rituxan every 6 months, polymyositis, hyperlipidemia who came to hospital with complaints of abdominal pain and back pain for past 3 days.  As per patient wife this started after patient lifted up a heavy dog food bag.  Patient said that the pain has been persistent, get worse on movement.  It is not associated with nausea vomiting or diarrhea.  Denies chest pain or shortness of breath.  He does have history of spinal stenosis.  MRI lumbar spine from 2016 showed moderate spinal stenosis L3-L4, L4-5 central disc protrusion with moderate spinal stenosis, small central disc protrusion at L5-S1 without neural impingement.    PT Comments    REASSESSMENT:  Patient presents up in chair (assisted by nursing staff), alert and agreeable for therapy.  Patient demonstrates much improvement in bed mobility, sit to stands, transfers and ambulation in room and hallways without loss of balance.  Patient on room air during gait training with SpO2 remaining at 93-95%, demonstrates slight limping on LLE due to chronic knee pain and constant baseline tingling in left foot secondary to old ankle surgery per patient.  Patient tolerated staying up in chair after therapy with his spouse present at bedside.  Patient will benefit from continued physical therapy in hospital and recommended venue below to increase strength, balance, endurance for safe ADLs and gait.   Follow Up Recommendations  Outpatient PT;Supervision - Intermittent     Equipment Recommendations  None recommended by PT    Recommendations for Other Services       Precautions / Restrictions Precautions Precautions: None Restrictions Weight Bearing Restrictions:  No    Mobility  Bed Mobility Overal bed mobility: Modified Independent Bed Mobility: Supine to Sit;Sit to Supine     Supine to sit: Modified independent (Device/Increase time) Sit to supine: Modified independent (Device/Increase time)   General bed mobility comments: slightly increased time  Transfers Overall transfer level: Modified independent Equipment used: None             General transfer comment: slightly increased time  Ambulation/Gait Ambulation/Gait assistance: Supervision Gait Distance (Feet): 200 Feet Assistive device: None Gait Pattern/deviations: Decreased step length - right;Decreased step length - left;Decreased stride length;Antalgic Gait velocity: decreased   General Gait Details: slightly labored cadence with mild antalgic gait on LLE due to chronic knee pain, no loss of balance, on room air with SpO2 staying at 93-95%   Stairs             Wheelchair Mobility    Modified Rankin (Stroke Patients Only)       Balance Overall balance assessment: No apparent balance deficits (not formally assessed)                                          Cognition Arousal/Alertness: Awake/alert Behavior During Therapy: WFL for tasks assessed/performed Overall Cognitive Status: Within Functional Limits for tasks assessed                                        Exercises General Exercises -  Lower Extremity Long Arc Quad: Seated;AROM;Strengthening;Both;10 reps Hip Flexion/Marching: Seated;AROM;Strengthening;Both;10 reps Toe Raises: Seated;AROM;Strengthening;Both;10 reps Heel Raises: Seated;AROM;Strengthening;Both;10 reps    General Comments        Pertinent Vitals/Pain Pain Assessment: No/denies pain Pain Descriptors / Indicators: Sore    Home Living                      Prior Function            PT Goals (current goals can now be found in the care plan section) Acute Rehab PT Goals Patient Stated  Goal: return home PT Goal Formulation: With patient/family Time For Goal Achievement: 07/30/19 Potential to Achieve Goals: Good Progress towards PT goals: Progressing toward goals    Frequency    Min 3X/week      PT Plan Current plan remains appropriate;Discharge plan needs to be updated    Co-evaluation              AM-PAC PT "6 Clicks" Mobility   Outcome Measure  Help needed turning from your back to your side while in a flat bed without using bedrails?: None Help needed moving from lying on your back to sitting on the side of a flat bed without using bedrails?: None Help needed moving to and from a bed to a chair (including a wheelchair)?: None Help needed standing up from a chair using your arms (e.g., wheelchair or bedside chair)?: None Help needed to walk in hospital room?: A Little Help needed climbing 3-5 steps with a railing? : A Little 6 Click Score: 22    End of Session   Activity Tolerance: Patient tolerated treatment well;Patient limited by fatigue Patient left: in chair;with call bell/phone within reach;with family/visitor present Nurse Communication: Mobility status PT Visit Diagnosis: Unsteadiness on feet (R26.81);Other abnormalities of gait and mobility (R26.89);Muscle weakness (generalized) (M62.81)     Time: 9024-0973 PT Time Calculation (min) (ACUTE ONLY): 31 min  Charges:  $Gait Training: 8-22 mins $Therapeutic Exercise: 8-22 mins                     3:42 PM, 07/28/19 Lonell Grandchild, MPT Physical Therapist with [facility] [phone] office [phone] mobile phone

## 2019-07-29 LAB — COMPREHENSIVE METABOLIC PANEL
ALT: 64 U/L — ABNORMAL HIGH (ref 0–44)
AST: 27 U/L (ref 15–41)
Albumin: 2.7 g/dL — ABNORMAL LOW (ref 3.5–5.0)
Alkaline Phosphatase: 48 U/L (ref 38–126)
Anion gap: 9 (ref 5–15)
BUN: 28 mg/dL — ABNORMAL HIGH (ref 8–23)
CO2: 21 mmol/L — ABNORMAL LOW (ref 22–32)
Calcium: 8 mg/dL — ABNORMAL LOW (ref 8.9–10.3)
Chloride: 112 mmol/L — ABNORMAL HIGH (ref 98–111)
Creatinine, Ser: 1.15 mg/dL (ref 0.61–1.24)
GFR calc Af Amer: >60 mL/min (ref >60)
GFR calc non Af Amer: >60 mL/min (ref >60)
Glucose, Bld: 121 mg/dL — ABNORMAL HIGH (ref 70–99)
Potassium: 3.2 mmol/L — ABNORMAL LOW (ref 3.5–5.1)
Sodium: 142 mmol/L (ref 135–145)
Total Bilirubin: 0.4 mg/dL (ref 0.3–1.2)
Total Protein: 5.2 g/dL — ABNORMAL LOW (ref 6.5–8.1)

## 2019-07-29 LAB — URINE CULTURE
Culture: NO GROWTH
Special Requests: NORMAL

## 2019-07-29 MED ORDER — CEFDINIR 300 MG PO CAPS: 300.0000 mg | ORAL_CAPSULE | Freq: Two times a day (BID) | ORAL | 0 refills | Status: AC

## 2019-07-29 MED ORDER — MORPHINE SULFATE ER 15 MG PO TBCR: 15.0000 mg | EXTENDED_RELEASE_TABLET | Freq: Two times a day (BID) | ORAL | 0 refills | Status: AC

## 2019-07-29 MED ORDER — PREDNISONE 20 MG PO TABS: 40.0000 mg | ORAL_TABLET | Freq: Every day | ORAL | 0 refills | Status: DC

## 2019-07-29 MED ORDER — PREDNISONE 10 MG PO TABS: 15.0000 mg | ORAL_TABLET | Freq: Every day | ORAL | 0 refills | Status: DC

## 2019-07-29 MED ORDER — LOPERAMIDE HCL 2 MG PO TABS: 2.0000 mg | ORAL_TABLET | Freq: Four times a day (QID) | ORAL | 0 refills | Status: DC | PRN

## 2019-07-29 MED ORDER — GABAPENTIN 300 MG PO CAPS: 300.0000 mg | ORAL_CAPSULE | Freq: Three times a day (TID) | ORAL | 1 refills | Status: DC

## 2019-07-29 MED ORDER — TAMSULOSIN HCL 0.4 MG PO CAPS: 0.4000 mg | ORAL_CAPSULE | Freq: Every day | ORAL | 2 refills | Status: DC

## 2019-07-29 MED ORDER — ONDANSETRON HCL 4 MG PO TABS: 4.0000 mg | ORAL_TABLET | Freq: Four times a day (QID) | ORAL | 0 refills | Status: DC | PRN

## 2019-07-29 MED ORDER — FOLIC ACID 1 MG PO TABS: 1.0000 mg | ORAL_TABLET | Freq: Every day | ORAL | 0 refills | Status: AC

## 2019-07-29 MED ORDER — METHOCARBAMOL 750 MG PO TABS: 750.0000 mg | ORAL_TABLET | Freq: Four times a day (QID) | ORAL | 3 refills | Status: DC

## 2019-07-29 MED ORDER — RANITIDINE HCL 300 MG PO TABS: 300.0000 mg | ORAL_TABLET | Freq: Every day | ORAL | 1 refills | Status: DC

## 2019-07-29 MED ORDER — AZITHROMYCIN 500 MG PO TABS: 500.0000 mg | ORAL_TABLET | Freq: Every day | ORAL | 0 refills | Status: AC

## 2019-07-29 MED ORDER — OXYCODONE HCL 5 MG PO TABS: 5.0000 mg | ORAL_TABLET | Freq: Four times a day (QID) | ORAL | 0 refills | Status: AC | PRN

## 2019-07-29 MED ORDER — GUAIFENESIN ER 600 MG PO TB12: 600.0000 mg | ORAL_TABLET | Freq: Two times a day (BID) | ORAL | 0 refills | Status: AC

## 2019-07-29 MED ORDER — POTASSIUM CHLORIDE CRYS ER 20 MEQ PO TBCR
40.0000 meq | EXTENDED_RELEASE_TABLET | ORAL | Status: AC
  Administered 2019-07-29 (×2): 40 meq via ORAL
  Filled 2019-07-29 (×2): qty 2

## 2019-07-29 MED ORDER — CALCIUM CARBONATE-VITAMIN D 500-200 MG-UNIT PO TABS: 1.0000 | ORAL_TABLET | Freq: Every day | ORAL | 2 refills | Status: AC

## 2019-07-29 NOTE — Discharge Instructions (Signed)
1)Avoid ibuprofen/Advil/Aleve/Motrin/Goody Powders/Naproxen/BC powders/Meloxicam/Diclofenac/Indomethacin and other Nonsteroidal anti-inflammatory medications while taking prednisone  2) follow-up with your neurologist at [facility]--- neurologist at [facility] Dr. Gustavus Bryant, [phone]   3) outpatient physical therapy as advised  4) May resume prednisone 15 mg daily with food after completing the 40 mg daily dose

## 2019-07-29 NOTE — Progress Notes (Signed)
Patient alert and oriented x4. Currently no complaints of pain, shortness of breath, chest pain, dizziness, nausea or vomiting. Patient sitting up in chair and ambulatory independently to commode. Patient tolerated PO meds and diet well. Appetite good. Patient voided clear, yellow urine (no blood or clots noted) in urinal and without difficulties. Dr Joesph Fillers made aware.  Discharge instructions, appointment follow-up information and medication education gone over with patient and his wife, Jacob Hunter. Both expressed full understanding of instructions, appointment information and education with teach back. IV removed without difficulty. Patient discharged home with all belongings and printed prescription for Mucinex for home via car. (Wife driving patient home).

## 2019-07-29 NOTE — Discharge Summary (Signed)
Jacob Hunter, is a 63 y.o. male  DOB [birth year]  MRN [id number].  Admission date:  07/25/2019  Admitting Physician  Oswald Hillock, MD  Discharge Date:  07/29/2019   Primary MD  No primary care provider on file.  Recommendations for primary care physician for things to follow:   1)Avoid ibuprofen/Advil/Aleve/Motrin/Goody Powders/Naproxen/BC powders/Meloxicam/Diclofenac/Indomethacin and other Nonsteroidal anti-inflammatory medications while taking prednisone  2) follow-up with your neurologist at [facility]--- neurologist at [facility] Dr. Gustavus Bryant, [phone]   3) outpatient physical therapy as advised  4) May resume prednisone 15 mg daily with food after completing the 40 mg daily dose  Admission Diagnosis  Back pain [M54.9] Epigastric pain [R10.13] Fever, unspecified fever cause [R50.9]   Discharge Diagnosis  Back pain [M54.9] Epigastric pain [R10.13] Fever, unspecified fever cause [R50.9]    Principal Problem:   Spinal stenosis Active Problems:   Back pain   Polymyositis ([place])      Past Medical History:  Diagnosis Date   Arthritis    Hypercholesteremia    Knee pain    Necrotizing myopathy    Polymyositis ([place])     Past Surgical History:  Procedure Laterality Date   ANKLE SURGERY Left    KNEE SURGERY Left      HPI  from the history and physical done on the day of admission:    Jacob Hunter  is a 63 y.o. male, with history of necrotizing myopathy, on chronic prednisone 15 mg daily, 1 g Solu-Medrol every 4 weeks, Rituxan every 6 months, polymyositis, hyperlipidemia who came to hospital with complaints of abdominal pain and back pain for past 3 days.  As per patient wife this started after patient lifted up a heavy dog food bag.  Patient said that the pain has been persistent, get worse on movement.  It is not associated with nausea vomiting  or diarrhea.  Denies chest pain or shortness of breath.  He does have history of spinal stenosis.  MRI lumbar spine from 2016 showed moderate spinal stenosis L3-L4, L4-5 central disc protrusion with moderate spinal stenosis, small central disc protrusion at L5-S1 without neural impingement. He denies fever or chills.  But in the ED he had a temperature of 103.1.  He is not hypoxic.  Initial Covid antigen test was negative.  SARS-CoV-2 RT-PCR 6-24 is currently pending. CT angio chest abdomen and pelvis was obtained in the ED which is unremarkable.   Hospital Course:    Intractable back pain -with anxiety --Overall improving -still denies having any paresthesia, radiation of pain to buttocks or lower extremities -History of a spinal stenosis at L3-4, L5-S1, with disc protrusion -Current pain management: Scheduled MS Contin 15 mg every 12, As needed OxyIR 5 mg every 6 hrs, Neurontin to 300 mg p.o. 3 times daily,  -PT--recommended outpatient physical therapy  -MRI of spine was reviewed: Moderate central canal stenosis at L2-3 due to a disc bulge and ligamentum flavum thickening is increased compared to the prior MRI. Otherwise no other acute  changes -Neurosurgery: Dr. Susy Manor was consulted. They have reviewed records.  Recommending no surgical intervention at this time.  Recommending pain management, His neurologist at [facility] Dr. Gustavus Bryant, [phone] was called Recommended further evaluation, because of pain, agreed with current treatment plan.   Left lower lobe pneumonia -patient had low-grade fever, mild hypoxia -Upon arrival one reading of temp 100.1, PT temp 99, currently 98 -, Blood pressure mildly elevated, afebrile -SARS-CoV-2 negative -UA negative -CTA chest/abdomen -no for PE, or aortic aneurysm  -Chest x-ray with suspicion of left-sided pneumonia-patient with elevated procalcitonin, elevated lactic acid -- de-escalated from Zosyn to  Rocephin and Zithromax, okay to  discharge on Omnicef and azithromycin for CAP  -After speaking with his primary neurologist due to patient being immune compromised, low-grade fever, mildly elevated lactic acid -patient empiric antibiotic   Polymyositis/necrotizing myopathy -patient has chronically elevated CPK.Continue prednisone 15 mg daily, may go back to prednisone 15 mg after completing 40 mg dose as prescribed - patient got Solu-Medrol 1000 mg every 4 weeks, Rituxan 375 mg/m weekly x4 every 6 months, methotrexate 25 mg weekly.  - CPK is 600, review of the records from [facility] showed that patient's CPK is chronically elevated in 400s.  Cultures; 07/27/2019 blood cultures   Code Status:   Code Status: Full Code Family Communication: Wife present at bedside, updated.  Patient's primary neurologist updated \--Discharged home in stable condition  Consultants:  [facility] Dr. Trenton Gammon Neurologist Patient's neurologist at [facility] Dr. Gustavus Bryant   CTA chest/abdomen:  IMPRESSION: CTA of the chest: No aneurysmal dilatation or dissection of the aorta is noted. The pulmonary artery as visualized is within normal limits. No other focal abnormality is noted. CTA of the abdomen and pelvis: Normal-appearing abdominal aorta. Nonobstructing right renal stones. Diverticular disease without diverticulitis. Stable small hepatic cysts.   MRI lumbar spine:  IMPRESSION: 1. No change in the appearance of the lumbar spine since the prior MRI. 2. Moderate central canal stenosis at L2-3 due to a disc bulge and ligamentum flavum thickening is increased compared to the prior MRI. 3. No change in moderate central canal stenosis and mild to moderate foraminal narrowing at L3-4. 4. No change in moderate central canal and left worse than right subarticular recess narrowing at L4-5. 5. No change in a shallow broad-based right paracentral protrusion at L5-S1 causes mild narrowing in the  subarticular recesses.  Follow UP  Follow-up Information    [facility]. Go to.   Specialty: Rehabilitation Contact information: [address] [id number] mc [place] [phone]         Diet and Activity recommendation:  As advised  Discharge Instructions    Discharge Instructions    Ambulatory referral to Physical Therapy   Complete by: As directed    Ambulatory referral to Physical Therapy   Complete by: As directed    Iontophoresis - 4 mg/ml of dexamethasone: Yes   T.E.N.S. Unit Evaluation and Dispense as Indicated: Yes   Call MD for:  difficulty breathing, headache or visual disturbances   Complete by: As directed    Call MD for:  persistant dizziness or light-headedness   Complete by: As directed    Call MD for:  persistant nausea and vomiting   Complete by: As directed    Call MD for:  severe uncontrolled pain   Complete by: As directed    Call MD for:  temperature >100.4   Complete by: As directed  Diet - low sodium heart healthy   Complete by: As directed    Diet - low sodium heart healthy   Complete by: As directed    Discharge instructions   Complete by: As directed    1)Avoid ibuprofen/Advil/Aleve/Motrin/Goody Powders/Naproxen/BC powders/Meloxicam/Diclofenac/Indomethacin and other Nonsteroidal anti-inflammatory medications while taking prednisone  2) follow-up with your neurologist at [facility]--- neurologist at [facility] Dr. Gustavus Bryant, [phone]   3) outpatient physical therapy as advised   Increase activity slowly   Complete by: As directed    Increase activity slowly   Complete by: As directed         Discharge Medications     Allergies as of 07/29/2019      Reactions   Methylprednisolone Anaphylaxis   Rash, SOB, ?anaphylaxis?   Other    High doses of steroids causes chest pain.    Statins Other (See Comments)   Statin induced  necrotizing myopathy with continuing antibody mediated myopathy      Medication List    STOP taking these medications   ibuprofen 200 MG tablet Commonly known as: ADVIL   methylPREDNISolone sodium succinate 1000 MG injection Commonly known as: SOLU-MEDROL     TAKE these medications   acetaminophen 500 MG tablet Commonly known as: TYLENOL Take 500 mg by mouth every 6 (six) hours as needed.   ascorbic acid 500 MG tablet Commonly known as: VITAMIN C Take 1,000 mg by mouth daily.   azithromycin 500 MG tablet Commonly known as: ZITHROMAX Take 1 tablet (500 mg total) by mouth daily for 3 days.   calcium-vitamin D 500-200 MG-UNIT tablet Commonly known as: OSCAL WITH D Take 1 tablet by mouth daily with breakfast. Start taking on: July 30, 2019 What changed: when to take this   cefdinir 300 MG capsule Commonly known as: OMNICEF Take 1 capsule (300 mg total) by mouth 2 (two) times daily for 5 days.   Co Q 10 10 MG Caps Take 10 mg by mouth daily.   FLUoxetine 20 MG capsule Commonly known as: PROZAC Take 20 mg by mouth daily.   folic acid 1 MG tablet Commonly known as: FOLVITE Take 1 tablet (1 mg total) by mouth daily.   gabapentin 300 MG capsule Commonly known as: NEURONTIN Take 1 capsule (300 mg total) by mouth 3 (three) times daily.   guaiFENesin 600 MG 12 hr tablet Commonly known as: Mucinex Take 1 tablet (600 mg total) by mouth 2 (two) times daily for 10 days.   loperamide 2 MG tablet Commonly known as: Imodium A-D Take 1 tablet (2 mg total) by mouth 4 (four) times daily as needed for diarrhea or loose stools.   methocarbamol 750 MG tablet Commonly known as: ROBAXIN Take 1 tablet (750 mg total) by mouth 4 (four) times daily.   methotrexate 2.5 MG tablet Commonly known as: RHEUMATREX Take 25 mg by mouth once a week. Caution:Chemotherapy. Protect from light.(monday) 10 tabs total   morphine 15 MG 12 hr tablet Commonly known as: MS CONTIN Take 1 tablet  (15 mg total) by mouth every 12 (twelve) hours for 5 days.   ondansetron 4 MG tablet Commonly known as: ZOFRAN Take 1 tablet (4 mg total) by mouth every 6 (six) hours as needed for nausea.   oxyCODONE 5 MG immediate release tablet Commonly known as: Oxy IR/ROXICODONE Take 1 tablet (5 mg total) by mouth every 6 (six) hours as needed for up to 5 days for moderate pain, severe pain or breakthrough  pain.   predniSONE 20 MG tablet Commonly known as: Deltasone Take 2 tablets (40 mg total) by mouth daily with breakfast. What changed:   medication strength  how much to take   ranitidine 300 MG tablet Commonly known as: ZANTAC Take 1 tablet (300 mg total) by mouth at bedtime.   riTUXimab 100 MG/10ML injection Commonly known as: RITUXAN Inject into the vein.   tamsulosin 0.4 MG Caps capsule Commonly known as: FLOMAX Take 1 capsule (0.4 mg total) by mouth daily after supper.       Major procedures and Radiology Reports - PLEASE review detailed and final reports for all details, in brief -   MR LUMBAR SPINE WO CONTRAST  Result Date: 07/26/2019 CLINICAL DATA:  Abdominal and low back pain for 3 days, worsened last night. EXAM: MRI LUMBAR SPINE WITHOUT CONTRAST TECHNIQUE: Multiplanar, multisequence MR imaging of the lumbar spine was performed. No intravenous contrast was administered. COMPARISON:  MRI lumbar spine 01/24/2015. FINDINGS: Segmentation:  Standard. Alignment:  Maintained. Vertebrae:  No fracture, evidence of discitis, or bone lesion. Conus medullaris and cauda equina: Conus extends to the L1 level. Conus and cauda equina appear normal. Paraspinal and other soft tissues: Negative. Disc levels: T11-12 and T12-L1 are imaged in the sagittal plane only and negative. L1-2: Negative. L2-3: There is a disc bulge and ligamentum flavum thickening causing moderate central canal stenosis, increased compared to the prior MRI. Foramina are open. L3-4: Moderate facet arthropathy, broad-based  disc bulge and mild ligamentum flavum thickening. Moderate central canal stenosis and mild to moderate foraminal narrowing, worse on the right. No change compared to the prior MRI. L4-5: Central and eccentric to the left disc protrusion causes moderate central canal and left worse than right subarticular recess narrowing. Neural foramina are open. No change. L5-S1: Shallow broad-based right paracentral protrusion slightly indents the ventral thecal sac and causes mild narrowing in the subarticular recesses. Mild bilateral foraminal narrowing. No change. IMPRESSION: 1. No change in the appearance of the lumbar spine since the prior MRI. 2. Moderate central canal stenosis at L2-3 due to a disc bulge and ligamentum flavum thickening is increased compared to the prior MRI. 3. No change in moderate central canal stenosis and mild to moderate foraminal narrowing at L3-4. 4. No change in moderate central canal and left worse than right subarticular recess narrowing at L4-5. 5. No change in a shallow broad-based right paracentral protrusion at L5-S1 causes mild narrowing in the subarticular recesses. Electronically Signed   By: Inge Rise M.D.   On: 07/26/2019 07:49   DG CHEST PORT 1 VIEW  Result Date: 07/28/2019 CLINICAL DATA:  63 year old male with a history of fever EXAM: PORTABLE CHEST 1 VIEW COMPARISON:  07/25/2019, 10/14/2015 FINDINGS: Cardiomediastinal silhouette unchanged in size and contour. No evidence of central vascular congestion. No interlobular septal thickening. Low lung volumes with linear opacity at the left lung base. No pneumothorax or pleural effusion. IMPRESSION: Low lung volumes with linear opacity at the left lung base, potentially atelectasis and/or consolidation. Electronically Signed   By: Corrie Mckusick D.O.   On: 07/28/2019 11:36   CT Angio Chest/Abd/Pel for Dissection W and/or Wo Contrast  Result Date: 07/25/2019 CLINICAL DATA:  Abdominal pain and low back pain for 3 days EXAM:  CT ANGIOGRAPHY CHEST, ABDOMEN AND PELVIS TECHNIQUE: Multidetector CT imaging through the chest, abdomen and pelvis was performed using the standard protocol during bolus administration of intravenous contrast. Multiplanar reconstructed images and MIPs were obtained and reviewed to evaluate the  vascular anatomy. CONTRAST:  148mL OMNIPAQUE IOHEXOL 350 MG/ML SOLN COMPARISON:  11/18/2014 FINDINGS: CTA CHEST FINDINGS Cardiovascular: Initial precontrast images show no hyperdense crescent to suggest intramural hematoma. The thoracic aorta demonstrates mild atherosclerotic calcifications. No cardiac enlargement is seen. No coronary calcifications are noted. The descending thoracic aorta appears within normal limits. The pulmonary artery shows a normal branching pattern without filling defect to suggest pulmonary embolism. Mediastinum/Nodes: The thoracic inlet is within normal limits. No sizable hilar or mediastinal adenopathy is noted. The esophagus as visualized is within normal limits. Lungs/Pleura: The lungs are well aerated bilaterally. No focal infiltrate, effusion or pneumothorax is seen. No parenchymal nodules. Musculoskeletal: No acute bony abnormality is seen. Review of the MIP images confirms the above findings. CTA ABDOMEN AND PELVIS FINDINGS VASCULAR Aorta: Abdominal aorta shows no aneurysmal dilatation or evidence of dissection. Minimal atherosclerotic calcifications are noted. Celiac: Patent without evidence of aneurysm, dissection, vasculitis or significant stenosis. SMA: Patent without evidence of aneurysm, dissection, vasculitis or significant stenosis. Renals: Both renal arteries are patent without evidence of aneurysm, dissection, vasculitis, fibromuscular dysplasia or significant stenosis. IMA: Patent without evidence of aneurysm, dissection, vasculitis or significant stenosis. Iliacs: Within normal limits. Veins: No specific venous abnormality is noted. Review of the MIP images confirms the above  findings. NON-VASCULAR Hepatobiliary: Scattered small cysts are noted throughout the liver stable from prior exam. The gallbladder is within normal limits. Pancreas: Unremarkable. No pancreatic ductal dilatation or surrounding inflammatory changes. Spleen: Normal in size without focal abnormality. Adrenals/Urinary Tract: Adrenal glands are within normal limits. Kidneys are well visualized bilaterally. A few tiny nonobstructing right renal stones are noted. The ureters are within normal limits. The bladder is partially distended. Stomach/Bowel: Scattered mild diverticular change is seen. No evidence of diverticulitis is noted. Stomach and small bowel are within normal limits. The appendix is within normal limits Lymphatic: No significant lymphadenopathy is noted. Reproductive: Prostate is unremarkable. Other: No abdominal wall hernia or abnormality. No abdominopelvic ascites. Musculoskeletal: No acute or significant osseous findings. Review of the MIP images confirms the above findings. IMPRESSION: CTA of the chest: No aneurysmal dilatation or dissection of the aorta is noted. The pulmonary artery as visualized is within normal limits. No other focal abnormality is noted. CTA of the abdomen and pelvis: Normal-appearing abdominal aorta. Nonobstructing right renal stones. Diverticular disease without diverticulitis. Stable small hepatic cysts. Electronically Signed   By: Inez Catalina M.D.   On: 07/25/2019 22:17   Micro Results    Recent Results (from the past 240 hour(s))  SARS CORONAVIRUS 2 (TAT 6-24 HRS) Nasopharyngeal Nasopharyngeal Swab     Status: None   Collection Time: 07/26/19  3:43 AM   Specimen: Nasopharyngeal Swab  Result Value Ref Range Status   SARS Coronavirus 2 NEGATIVE NEGATIVE Final    Comment: (NOTE) SARS-CoV-2 target nucleic acids are NOT DETECTED. The SARS-CoV-2 RNA is generally detectable in upper and lower respiratory specimens during the acute phase of infection. Negative results  do not preclude SARS-CoV-2 infection, do not rule out co-infections with other pathogens, and should not be used as the sole basis for treatment or other patient management decisions. Negative results must be combined with clinical observations, patient history, and epidemiological information. The expected result is Negative. Fact Sheet for Patients: SugarRoll.be Fact Sheet for Healthcare Providers: https://www.woods-mathews.com/ This test is not yet approved or cleared by the [place] FDA and  has been authorized for detection and/or diagnosis of SARS-CoV-2 by FDA under an Emergency Use Authorization (EUA). This EUA  will remain  in effect (meaning this test can be used) for the duration of the COVID-19 declaration under Section 56 4(b)(1) of the Act, 21 U.S.C. section 360bbb-3(b)(1), unless the authorization is terminated or revoked sooner. Performed at [facility], [place] [address], [place], [place] [address]   MRSA PCR Screening     Status: None   Collection Time: 07/27/19  4:27 PM   Specimen: Nasal Mucosa; Nasopharyngeal  Result Value Ref Range Status   MRSA by PCR NEGATIVE NEGATIVE Final    Comment:        The GeneXpert MRSA Assay (FDA approved for NASAL specimens only), is one component of a comprehensive MRSA colonization surveillance program. It is not intended to diagnose MRSA infection nor to guide or monitor treatment for MRSA infections. Performed at [facility], [address], [place], [place] [address]   Culture, Urine     Status: None   Collection Time: 07/28/19  6:26 AM   Specimen: Urine, Catheterized  Result Value Ref Range Status   Specimen Description   Final    URINE, CATHETERIZED Performed at [facility], [address], [place], [place] [address]    Special Requests   Final    Normal Performed at [facility], [address] [place], [place] [address]    Culture   Final    NO GROWTH Performed  at [facility], [place] [address] [place], [place] [address]    Report Status 07/29/2019 FINAL  Final       Today   Subjective    Zameer Borman today has no new complaints, No fever  Or chills  -Back pain is much better, diarrhea is improving,           Patient has been seen and examined prior to discharge   Objective   Blood pressure 118/73, pulse 78, temperature 98.1 F (36.7 C), temperature source Oral, resp. rate (!) 22, height 6\' 1"  (1.854 m), weight 112.4 kg, SpO2 96 %.   Intake/Output Summary (Last 24 hours) at 07/29/2019 1642 Last data filed at 07/29/2019 1641 Gross per 24 hour  Intake 3894.03 ml  Output 375 ml  Net 3519.03 ml    Exam Gen:- Awake Alert, no acute distress  HEENT:- [place].AT, No sclera icterus Neck-Supple Neck,No JVD,.  Lungs-  CTAB , good air movement bilaterally  CV- S1, S2 normal, regular Abd-  +ve B.Sounds, Abd Soft, No tenderness,    Extremity/Skin:- No  edema,   good pulses Psych-affect is appropriate, oriented x3 Neuro-generalized weakness, no new focal deficits, no tremors  MSK-overall much improved range of motion, gait is more steady and independent   Data Review   CBC w Diff:  Lab Results  Component Value Date   WBC 9.3 07/26/2019   HGB 11.2 (L) 07/26/2019   HCT 34.2 (L) 07/26/2019   PLT 126 (L) 07/26/2019   LYMPHOPCT 4 07/25/2019   MONOPCT 9 07/25/2019   EOSPCT 1 07/25/2019   BASOPCT 0 07/25/2019    CMP:  Lab Results  Component Value Date   NA 142 07/29/2019   K 3.2 (L) 07/29/2019   CL 112 (H) 07/29/2019   CO2 21 (L) 07/29/2019   BUN 28 (H) 07/29/2019   CREATININE 1.15 07/29/2019   PROT 5.2 (L) 07/29/2019   ALBUMIN 2.7 (L) 07/29/2019   BILITOT 0.4 07/29/2019   ALKPHOS 48 07/29/2019   AST 27 07/29/2019   ALT 64 (H) 07/29/2019  .   Total Discharge time is  about 33 minutes  Roxan Hockey M.D on 07/29/2019 at 4:42 PM  Go to [email or web address] -  for contact info  [facility] - Office   [phone]

## 2019-07-29 NOTE — TOC Transition Note (Signed)
Transition of Care [facility]) - CM/SW Discharge Note   Patient Details  Name: Jacob Hunter MRN: [id number] Date of Birth: 07-12-1956  Transition of Care [facility]) CM/SW Contact:  Boneta Lucks, RN Phone Number: 07/29/2019, 1:39 PM   Clinical Narrative:   Discharge planning, PT recommending Out patient PT or Home Health. Patient wants Out Patient PT in [place].  Orders placed and added to AVS.     Final next level of care: Home/Self Care Barriers to Discharge: Barriers Resolved   Patient Goals and CMS Choice Patient states their goals for this hospitalization and ongoing recovery are:: to go to outpatient PT. CMS Medicare.gov Compare Post Acute Care list provided to:: Patient Choice offered to / list presented to : Patient  Discharge Placement      Patient and family notified of of transfer: 07/29/19  Discharge Plan and Services In-house Referral: Clinical Social Work Discharge Planning Services: CM Consult              Out patient PT- [employer]

## 2019-08-02 DIAGNOSIS — R1310 Dysphagia, unspecified: Secondary | ICD-10-CM | POA: Diagnosis not present

## 2019-08-02 DIAGNOSIS — M545 Low back pain: Secondary | ICD-10-CM | POA: Diagnosis not present

## 2019-08-02 DIAGNOSIS — J189 Pneumonia, unspecified organism: Secondary | ICD-10-CM | POA: Diagnosis not present

## 2019-08-05 ENCOUNTER — Emergency Department (HOSPITAL_COMMUNITY): Payer: PPO

## 2019-08-05 ENCOUNTER — Emergency Department (HOSPITAL_COMMUNITY)
Admission: EM | Admit: 2019-08-05 | Discharge: 2019-08-05 | Disposition: A | Discharge status: Home / Self Care | Payer: PPO | Attending: Emergency Medicine | Admitting: Emergency Medicine

## 2019-08-05 ENCOUNTER — Encounter (HOSPITAL_COMMUNITY): Payer: Self-pay

## 2019-08-05 ENCOUNTER — Other Ambulatory Visit: Payer: Self-pay

## 2019-08-05 DIAGNOSIS — R600 Localized edema: Secondary | ICD-10-CM | POA: Diagnosis not present

## 2019-08-05 DIAGNOSIS — Z79899 Other long term (current) drug therapy: Secondary | ICD-10-CM | POA: Diagnosis not present

## 2019-08-05 DIAGNOSIS — R531 Weakness: Secondary | ICD-10-CM | POA: Diagnosis not present

## 2019-08-05 DIAGNOSIS — R197 Diarrhea, unspecified: Secondary | ICD-10-CM | POA: Diagnosis not present

## 2019-08-05 DIAGNOSIS — R000 Tachycardia, unspecified: Secondary | ICD-10-CM | POA: Diagnosis not present

## 2019-08-05 DIAGNOSIS — R5383 Other fatigue: Secondary | ICD-10-CM | POA: Insufficient documentation

## 2019-08-05 DIAGNOSIS — R52 Pain, unspecified: Secondary | ICD-10-CM

## 2019-08-05 DIAGNOSIS — R0602 Shortness of breath: Secondary | ICD-10-CM | POA: Insufficient documentation

## 2019-08-05 DIAGNOSIS — R2243 Localized swelling, mass and lump, lower limb, bilateral: Secondary | ICD-10-CM | POA: Insufficient documentation

## 2019-08-05 DIAGNOSIS — R609 Edema, unspecified: Secondary | ICD-10-CM

## 2019-08-05 DIAGNOSIS — R109 Unspecified abdominal pain: Secondary | ICD-10-CM | POA: Diagnosis present

## 2019-08-05 LAB — CBC WITH DIFFERENTIAL/PLATELET
Abs Immature Granulocytes: 0.07 10*3/uL (ref 0.00–0.07)
Basophils Absolute: 0 10*3/uL (ref 0.0–0.1)
Basophils Relative: 0 %
Eosinophils Absolute: 0.1 10*3/uL (ref 0.0–0.5)
Eosinophils Relative: 1 %
HCT: 38.4 % — ABNORMAL LOW (ref 39.0–52.0)
Hemoglobin: 12.3 g/dL — ABNORMAL LOW (ref 13.0–17.0)
Immature Granulocytes: 1 %
Lymphocytes Relative: 13 %
Lymphs Abs: 1.3 10*3/uL (ref 0.7–4.0)
MCH: 30.6 pg (ref 26.0–34.0)
MCHC: 32 g/dL (ref 30.0–36.0)
MCV: 95.5 fL (ref 80.0–100.0)
Monocytes Absolute: 1.2 10*3/uL — ABNORMAL HIGH (ref 0.1–1.0)
Monocytes Relative: 12 %
Neutro Abs: 7.2 10*3/uL (ref 1.7–7.7)
Neutrophils Relative %: 73 %
Platelets: 301 10*3/uL (ref 150–400)
RBC: 4.02 MIL/uL — ABNORMAL LOW (ref 4.22–5.81)
RDW: 15.6 % — ABNORMAL HIGH (ref 11.5–15.5)
WBC: 9.9 10*3/uL (ref 4.0–10.5)
nRBC: 0 % (ref 0.0–0.2)

## 2019-08-05 LAB — URINALYSIS, ROUTINE W REFLEX MICROSCOPIC
Bacteria, UA: NONE SEEN
Bilirubin Urine: NEGATIVE
Glucose, UA: NEGATIVE mg/dL
Ketones, ur: NEGATIVE mg/dL
Leukocytes,Ua: NEGATIVE
Nitrite: NEGATIVE
Protein, ur: NEGATIVE mg/dL
Specific Gravity, Urine: 1.008 (ref 1.005–1.030)
pH: 5 (ref 5.0–8.0)

## 2019-08-05 LAB — COMPREHENSIVE METABOLIC PANEL
ALT: 49 U/L — ABNORMAL HIGH (ref 0–44)
AST: 24 U/L (ref 15–41)
Albumin: 3.7 g/dL (ref 3.5–5.0)
Alkaline Phosphatase: 68 U/L (ref 38–126)
Anion gap: 13 (ref 5–15)
BUN: 11 mg/dL (ref 8–23)
CO2: 25 mmol/L (ref 22–32)
Calcium: 8.8 mg/dL — ABNORMAL LOW (ref 8.9–10.3)
Chloride: 103 mmol/L (ref 98–111)
Creatinine, Ser: 0.84 mg/dL (ref 0.61–1.24)
GFR calc Af Amer: >60 mL/min (ref >60)
GFR calc non Af Amer: >60 mL/min (ref >60)
Glucose, Bld: 99 mg/dL (ref 70–99)
Potassium: 3.3 mmol/L — ABNORMAL LOW (ref 3.5–5.1)
Sodium: 141 mmol/L (ref 135–145)
Total Bilirubin: 0.8 mg/dL (ref 0.3–1.2)
Total Protein: 6.5 g/dL (ref 6.5–8.1)

## 2019-08-05 LAB — BRAIN NATRIURETIC PEPTIDE: B Natriuretic Peptide: 72 pg/mL (ref 0.0–100.0)

## 2019-08-05 LAB — LIPASE, BLOOD: Lipase: 31 U/L (ref 11–51)

## 2019-08-05 MED ORDER — POTASSIUM CHLORIDE CRYS ER 20 MEQ PO TBCR
40.0000 meq | EXTENDED_RELEASE_TABLET | Freq: Once | ORAL | Status: AC
  Administered 2019-08-05: 40 meq via ORAL
  Filled 2019-08-05: qty 2

## 2019-08-05 MED ORDER — SODIUM CHLORIDE 0.9 % IV BOLUS
1000.0000 mL | Freq: Once | INTRAVENOUS | Status: AC
  Administered 2019-08-05: 1000 mL via INTRAVENOUS

## 2019-08-05 MED ORDER — ONDANSETRON HCL 4 MG/2ML IJ SOLN
4.0000 mg | Freq: Once | INTRAMUSCULAR | Status: AC
  Administered 2019-08-05: 4 mg via INTRAVENOUS
  Filled 2019-08-05: qty 2

## 2019-08-05 MED ORDER — MORPHINE SULFATE (PF) 4 MG/ML IV SOLN
4.0000 mg | Freq: Once | INTRAVENOUS | Status: AC
  Administered 2019-08-05: 4 mg via INTRAVENOUS
  Filled 2019-08-05: qty 1

## 2019-08-05 NOTE — Discharge Instructions (Signed)
Get help right away if: You have chest pain. You feel extremely weak or you faint. You have bloody or black stools or stools that look like tar. You have severe pain, cramping, or bloating in your abdomen. You have trouble breathing or you are breathing very quickly. Your heart is beating very quickly. Your skin feels cold and clammy. You feel confused. You have signs of dehydration, such as: Dark urine, very little urine, or no urine. Cracked lips. Dry mouth. Sunken eyes. Sleepiness. Weakness.

## 2019-08-05 NOTE — ED Triage Notes (Signed)
Pt c/o abd pain, back pain, diarrhea, and sob since last Thursday.  Says was tested last Monday for covid abd was negative.

## 2019-08-05 NOTE — ED Notes (Signed)
PT has tolerated po fluids. PT ambulated in the hallway and has been unable to give stool sample. No diarrhea for 8 hours now. EDP made aware.

## 2019-08-05 NOTE — ED Notes (Signed)
Pt reports his SOB is normal for him. He denies that it is any worse than normal. Pt reports he has been SOB like this for the last 3 years.

## 2019-08-05 NOTE — ED Provider Notes (Signed)
[facility] EMERGENCY DEPARTMENT Provider Note   CSN: [id number] Arrival date & time: 08/05/19  5284     History Chief Complaint  Patient presents with  . Abdominal Pain    RIZWAN KUYPER is a 63 y.o. male.  The history is provided by the patient and medical records. No language interpreter was used.  Abdominal Pain      63 year old male with history of spinal stenosis, hypercholesterolemia, prior polymyositis presented to the ED from home for evaluation of diarrhea.  Patient report approximately a week and half ago he was seen and evaluated in the hospital for back pain on the right side.  He was subsequently admitted for intractable back pain along with pneumonia.  He was placed on antibiotic, discharged home and he recently finished his antibiotic 2 days ago.  Patient states for the past week he has had recurrent diarrhea that is dark in color, crampy abdominal pain, along with nausea but without vomiting.  Endorsed feeling weak and fatigued from his persistent diarrhea.  States he has typically more than 4 episodes of diarrhea per day.  He denies fever, worsening shortness of breath, increasing cough, dysuria or rash.  He denies any recent NSAID use.  He is on chronic prednisone.  Past Medical History:  Diagnosis Date  . Arthritis   . Hypercholesteremia   . Knee pain   . Necrotizing myopathy   . Polymyositis [facility])     Patient Active Problem List   Diagnosis Date Noted  . Back pain 07/26/2019  . Spinal stenosis 07/26/2019  . Polymyositis ([place]) 07/26/2019  . Pain in left knee 07/21/2018  . Anxiety and depression 12/30/2015  . Impaired mobility and activities of daily living 12/30/2015  . 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 Bilateral leg weakness 01/24/2015  . Elevated CPK 01/24/2015  . Transaminitis 10/24/2014    Past Surgical History:  Procedure Laterality Date  . ANKLE SURGERY Left   . KNEE SURGERY Left        Family History  Problem Relation Age of Onset   . Hypertension Father   . Hypercholesterolemia Father   . Hypercholesterolemia Sister   . Hypertension Mother   . Breast cancer Sister   . Colon cancer Neg Hx   . Liver disease Neg Hx     Social History   Tobacco Use  . Smoking status: Never Smoker  . Smokeless tobacco: Never Used  Substance Use Topics  . Alcohol use: No    Alcohol/week: 0.0 standard drinks    Comment: Last ETOH was 1-2 drinks in 1990.  . Drug use: No    Home Medications Prior to Admission medications   Medication Sig Start Date End Date Taking? Authorizing Provider  acetaminophen (TYLENOL) 500 MG tablet Take 500 mg by mouth every 6 (six) hours as needed.    [provider]  ascorbic acid (VITAMIN C) 500 MG tablet Take 1,000 mg by mouth daily.     [provider]  calcium-vitamin D (OSCAL WITH D) 500-200 MG-UNIT tablet Take 1 tablet by mouth daily with breakfast. 07/30/19   Emokpae, Courage, MD  Coenzyme Q10 (CO Q 10) 10 MG CAPS Take 10 mg by mouth daily.    [provider]  FLUoxetine (PROZAC) 20 MG capsule Take 20 mg by mouth daily.    [provider]  folic acid (FOLVITE) 1 MG tablet Take 1 tablet (1 mg total) by mouth daily. 07/29/19   Roxan Hockey, MD  gabapentin (NEURONTIN) 300 MG capsule Take  1 capsule (300 mg total) by mouth 3 (three) times daily. 07/29/19   Roxan Hockey, MD  guaiFENesin (MUCINEX) 600 MG 12 hr tablet Take 1 tablet (600 mg total) by mouth 2 (two) times daily for 10 days. 07/29/19 08/08/19  Roxan Hockey, MD  loperamide (IMODIUM A-D) 2 MG tablet Take 1 tablet (2 mg total) by mouth 4 (four) times daily as needed for diarrhea or loose stools. 07/29/19   Roxan Hockey, MD  methocarbamol (ROBAXIN) 750 MG tablet Take 1 tablet (750 mg total) by mouth 4 (four) times daily. 07/29/19   Roxan Hockey, MD  methotrexate (RHEUMATREX) 2.5 MG tablet Take 25 mg by mouth once a week. Caution:Chemotherapy. Protect from light.(monday) 10 tabs total     [provider]  ondansetron (ZOFRAN) 4 MG tablet Take 1 tablet (4 mg total) by mouth every 6 (six) hours as needed for nausea. 07/29/19   Roxan Hockey, MD  predniSONE (DELTASONE) 10 MG tablet Take 1.5 tablets (15 mg total) by mouth daily with breakfast. 07/29/19   Denton Brick, Courage, MD  predniSONE (DELTASONE) 20 MG tablet Take 2 tablets (40 mg total) by mouth daily with breakfast. 07/29/19   Denton Brick, Courage, MD  ranitidine (ZANTAC) 300 MG tablet Take 1 tablet (300 mg total) by mouth at bedtime. 07/29/19 07/28/20  Roxan Hockey, MD  riTUXimab (RITUXAN) 100 MG/10ML injection Inject into the vein.    [provider]  tamsulosin (FLOMAX) 0.4 MG CAPS capsule Take 1 capsule (0.4 mg total) by mouth daily after supper. 07/29/19   Roxan Hockey, MD    Allergies    Methylprednisolone, Other, and Statins  Review of Systems   Review of Systems  Gastrointestinal: Positive for abdominal pain.  All other systems reviewed and are negative.   Physical Exam Updated Vital Signs BP 140/87   Pulse 82   Temp 98.5 F (36.9 C) (Oral)   Resp 15   Ht 6\' 1"  (1.854 m)   Wt 112 kg   SpO2 95%   BMI 32.58 kg/m   Physical Exam Vitals and nursing note reviewed.  Constitutional:      General: He is not in acute distress.    Appearance: He is well-developed.  HENT:     Head: Atraumatic.  Eyes:     Conjunctiva/sclera: Conjunctivae normal.  Cardiovascular:     Rate and Rhythm: Tachycardia present.  Pulmonary:     Effort: Pulmonary effort is normal.     Breath sounds: Normal breath sounds.  Abdominal:     General: Abdomen is flat.     Palpations: Abdomen is soft.     Tenderness: There is abdominal tenderness (Tenderness to right side abdomen on palpation without guarding or rebound tenderness.  Tenderness to epigastric region as well.).  Musculoskeletal:     Cervical back: Neck supple.  Skin:    Findings: No rash.  Neurological:     Mental Status: He is alert and oriented  to person, place, and time.     Comments: 5 out of 5 strength all 4 extremities.  Able to ambulate.  Psychiatric:        Mood and Affect: Mood normal.     ED Results / Procedures / Treatments   Labs (all labs ordered are listed, but only abnormal results are displayed) Labs Reviewed  CBC WITH DIFFERENTIAL/PLATELET - Abnormal; Notable for the following components:      Result Value   RBC 4.02 (*)    Hemoglobin 12.3 (*)    HCT 38.4 (*)  RDW 15.6 (*)    Monocytes Absolute 1.2 (*)    All other components within normal limits  COMPREHENSIVE METABOLIC PANEL - Abnormal; Notable for the following components:   Potassium 3.3 (*)    Calcium 8.8 (*)    ALT 49 (*)    All other components within normal limits  URINALYSIS, ROUTINE W REFLEX MICROSCOPIC - Abnormal; Notable for the following components:   Hgb urine dipstick SMALL (*)    All other components within normal limits  C DIFFICILE QUICK SCREEN W PCR REFLEX  LIPASE, BLOOD  BRAIN NATRIURETIC PEPTIDE    EKG EKG Interpretation  Date/Time:  Thursday August 05 2019 12:18:24 EST Ventricular Rate:  80 PR Interval:    QRS Duration: 106 QT Interval:  397 QTC Calculation: 458 R Axis:   83 Text Interpretation: Sinus rhythm Probable inferior infarct, old Since last tracing rate slower otherwise, no changes seen Confirmed by Noemi Chapel [phone]) on 08/05/2019 12:24:37 PM   Radiology DG Chest Portable 1 View  Result Date: 08/05/2019 CLINICAL DATA:  Shortness of breath EXAM: PORTABLE CHEST 1 VIEW COMPARISON:  July 28, 2019 FINDINGS: Lungs are clear. Heart is upper normal in size with pulmonary vascularity normal. No adenopathy. No bone lesions. IMPRESSION: No edema or consolidation.  Heart upper normal in size. Electronically Signed   By: Lowella Grip III M.D.   On: 08/05/2019 11:31    Procedures Procedures (including critical care time)  Medications Ordered in ED Medications  potassium chloride SA (KLOR-CON) CR  tablet 40 mEq (has no administration in time range)  sodium chloride 0.9 % bolus 1,000 mL (1,000 mLs Intravenous New Bag/Given 08/05/19 1258)  ondansetron (ZOFRAN) injection 4 mg (4 mg Intravenous Given 08/05/19 1259)  morphine 4 MG/ML injection 4 mg (4 mg Intravenous Given 08/05/19 1259)    ED Course  I have reviewed the triage vital signs and the nursing notes.  Pertinent labs & imaging results that were available during my care of the patient were reviewed by me and considered in my medical decision making (see chart for details).    MDM Rules/Calculators/A&P                      BP (!) 142/90   Pulse 80   Temp 98.5 F (36.9 C) (Oral)   Resp 15   Ht 6\' 1"  (1.854 m)   Wt 112 kg   SpO2 95%   BMI 32.58 kg/m   Final Clinical Impression(s) / ED Diagnoses Final diagnoses:  Diarrhea, unspecified type  Peripheral edema    Rx / DC Orders ED Discharge Orders    None     10:22 AM Patient here with generalized weakness and diarrhea for the past week.  Recently taking antibiotic for pneumonia.  Does have some abdominal discomfort with more than 4 episodes of diarrhea daily which may suggest potential C. difficile infection.  Work-up initiated, IV fluid given, will check for C. difficile.  Most recent COVID-19 test from hospitalization a week ago is negative.  1:53 PM Labs are reassuring and at baseline.  Chest x-ray unremarkable, EKG reviewed by me showing no new changes.  Patient have not produced a bowel movement here for testing of C. difficile.  Urine is currently pending.  Patient received IV fluid, resting comfortably.  2:23 PM UA without signs of urine tract infection. At this time patient complaining of bilateral leg swelling which has been ongoing for the past week. On exam he does have 1+  pitting edema to bilateral lower extremities without any calf tenderness. Dorsalis pedis pulse palpable. Kidney function is normal. Chest x-ray unremarkable, no evidence of pleural  effusion. BNP ordered. Low suspicion for bilateral DVT given his presentation.  3:43 PM BNP normal.  However after discussing risk factors of leg swelling, pt prefers doppler study to r/o DVT.  Study ordered.  Pt sign out to oncoming provider who will f/u on stool sample and doppler study.    Domenic Moras, PA-C 08/05/19 1606    Noemi Chapel, MD 08/07/19 [phone]

## 2019-08-06 ENCOUNTER — Emergency Department (HOSPITAL_COMMUNITY)
Admission: EM | Admit: 2019-08-06 | Discharge: 2019-08-06 | Disposition: A | Discharge status: Home / Self Care | Payer: PPO | Attending: Emergency Medicine | Admitting: Emergency Medicine

## 2019-08-06 ENCOUNTER — Emergency Department (HOSPITAL_COMMUNITY): Payer: PPO

## 2019-08-06 ENCOUNTER — Other Ambulatory Visit: Payer: Self-pay

## 2019-08-06 DIAGNOSIS — R109 Unspecified abdominal pain: Secondary | ICD-10-CM | POA: Insufficient documentation

## 2019-08-06 DIAGNOSIS — A09 Infectious gastroenteritis and colitis, unspecified: Secondary | ICD-10-CM | POA: Insufficient documentation

## 2019-08-06 DIAGNOSIS — R195 Other fecal abnormalities: Secondary | ICD-10-CM | POA: Diagnosis present

## 2019-08-06 LAB — CBC WITH DIFFERENTIAL/PLATELET
Abs Immature Granulocytes: 0.07 10*3/uL (ref 0.00–0.07)
Basophils Absolute: 0 10*3/uL (ref 0.0–0.1)
Basophils Relative: 0 %
Eosinophils Absolute: 0 10*3/uL (ref 0.0–0.5)
Eosinophils Relative: 0 %
HCT: 39.7 % (ref 39.0–52.0)
Hemoglobin: 12.3 g/dL — ABNORMAL LOW (ref 13.0–17.0)
Immature Granulocytes: 1 %
Lymphocytes Relative: 11 %
Lymphs Abs: 1 10*3/uL (ref 0.7–4.0)
MCH: 30.2 pg (ref 26.0–34.0)
MCHC: 31 g/dL (ref 30.0–36.0)
MCV: 97.5 fL (ref 80.0–100.0)
Monocytes Absolute: 0.7 10*3/uL (ref 0.1–1.0)
Monocytes Relative: 8 %
Neutro Abs: 7 10*3/uL (ref 1.7–7.7)
Neutrophils Relative %: 80 %
Platelets: 287 10*3/uL (ref 150–400)
RBC: 4.07 MIL/uL — ABNORMAL LOW (ref 4.22–5.81)
RDW: 15.6 % — ABNORMAL HIGH (ref 11.5–15.5)
WBC: 8.8 10*3/uL (ref 4.0–10.5)
nRBC: 0 % (ref 0.0–0.2)

## 2019-08-06 LAB — BASIC METABOLIC PANEL
Anion gap: 11 (ref 5–15)
BUN: 11 mg/dL (ref 8–23)
CO2: 25 mmol/L (ref 22–32)
Calcium: 8.7 mg/dL — ABNORMAL LOW (ref 8.9–10.3)
Chloride: 106 mmol/L (ref 98–111)
Creatinine, Ser: 0.84 mg/dL (ref 0.61–1.24)
GFR calc Af Amer: >60 mL/min (ref >60)
GFR calc non Af Amer: >60 mL/min (ref >60)
Glucose, Bld: 105 mg/dL — ABNORMAL HIGH (ref 70–99)
Potassium: 3.6 mmol/L (ref 3.5–5.1)
Sodium: 142 mmol/L (ref 135–145)

## 2019-08-06 MED ORDER — ONDANSETRON HCL 4 MG/2ML IJ SOLN
4.0000 mg | Freq: Once | INTRAMUSCULAR | Status: AC
  Administered 2019-08-06: 4 mg via INTRAVENOUS
  Filled 2019-08-06: qty 2

## 2019-08-06 MED ORDER — IOHEXOL 300 MG/ML  SOLN
100.0000 mL | Freq: Once | INTRAMUSCULAR | Status: AC | PRN
  Administered 2019-08-06: 100 mL via INTRAVENOUS

## 2019-08-06 MED ORDER — SODIUM CHLORIDE 0.9 % IV BOLUS
1000.0000 mL | Freq: Once | INTRAVENOUS | Status: AC
  Administered 2019-08-06: 1000 mL via INTRAVENOUS

## 2019-08-06 NOTE — ED Triage Notes (Signed)
Patient seen in this ED yesterday with continuing symptoms, diarrhea and bloody stool. Patient has had 4 episodes of diarrhea and last 2 with blood.

## 2019-08-06 NOTE — ED Provider Notes (Signed)
[facility] EMERGENCY DEPARTMENT Provider Note   CSN: [id number] Arrival date & time: 08/06/19  1433     History Chief Complaint  Patient presents with  . Diarrhea    bloody stool    Jacob Hunter is a 63 y.o. male.  Patient states that he had antibiotics last week and has been having diarrhea for the last few days with some blood in his diarrhea.  Mild abdominal discomfort.  The history is provided by the patient. No language interpreter was used.  Diarrhea Quality:  Bloody Severity:  Mild Onset quality:  Sudden Timing:  Intermittent Progression:  Unchanged Relieved by:  Nothing Worsened by:  Nothing Ineffective treatments:  None tried Associated symptoms: abdominal pain   Associated symptoms: no headaches        Past Medical History:  Diagnosis Date  . Arthritis   . Hypercholesteremia   . Knee pain   . Necrotizing myopathy   . Polymyositis [facility])     Patient Active Problem List   Diagnosis Date Noted  . Back pain 07/26/2019  . Spinal stenosis 07/26/2019  . Polymyositis ([place]) 07/26/2019  . Pain in left knee 07/21/2018  . Anxiety and depression 12/30/2015  . Impaired mobility and activities of daily living 12/30/2015  . 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 Bilateral leg weakness 01/24/2015  . Elevated CPK 01/24/2015  . Transaminitis 10/24/2014    Past Surgical History:  Procedure Laterality Date  . ANKLE SURGERY Left   . KNEE SURGERY Left        Family History  Problem Relation Age of Onset  . Hypertension Father   . Hypercholesterolemia Father   . Hypercholesterolemia Sister   . Hypertension Mother   . Breast cancer Sister   . Colon cancer Neg Hx   . Liver disease Neg Hx     Social History   Tobacco Use  . Smoking status: Never Smoker  . Smokeless tobacco: Never Used  Substance Use Topics  . Alcohol use: No    Alcohol/week: 0.0 standard drinks    Comment: Last ETOH was 1-2 drinks in 1990.  . Drug use: No    Home  Medications Prior to Admission medications   Medication Sig Start Date End Date Taking? Authorizing Provider  acetaminophen (TYLENOL) 500 MG tablet Take 500 mg by mouth every 6 (six) hours as needed.   Yes [provider]  ascorbic acid (VITAMIN C) 500 MG tablet Take 1,000 mg by mouth daily.    Yes [provider]  calcium-vitamin D (OSCAL WITH D) 500-200 MG-UNIT tablet Take 1 tablet by mouth daily with breakfast. 07/30/19  Yes Emokpae, Courage, MD  Coenzyme Q10 (CO Q 10) 10 MG CAPS Take 10 mg by mouth daily.   Yes [provider]  fluconazole (DIFLUCAN) 100 MG tablet Take 100 mg by mouth daily. 08/02/19  Yes [provider]  FLUoxetine (PROZAC) 20 MG capsule Take 20 mg by mouth daily.   Yes [provider]  folic acid (FOLVITE) 1 MG tablet Take 1 tablet (1 mg total) by mouth daily. 07/29/19  Yes Emokpae, Courage, MD  guaiFENesin (MUCINEX) 600 MG 12 hr tablet Take 1 tablet (600 mg total) by mouth 2 (two) times daily for 10 days. 07/29/19 08/08/19 Yes Emokpae, Courage, MD  loperamide (IMODIUM A-D) 2 MG tablet Take 1 tablet (2 mg total) by mouth 4 (four) times daily as needed for diarrhea or loose stools. 07/29/19  Yes Emokpae, Courage, MD  methocarbamol (ROBAXIN) 750 MG tablet Take  1 tablet (750 mg total) by mouth 4 (four) times daily. 07/29/19  Yes Emokpae, Courage, MD  methotrexate (RHEUMATREX) 2.5 MG tablet Take 25 mg by mouth once a week. Caution:Chemotherapy. Protect from light.(monday) 10 tabs total   Yes [provider]  predniSONE (DELTASONE) 20 MG tablet Take 2 tablets (40 mg total) by mouth daily with breakfast. Patient taking differently: Take 30 mg by mouth daily with breakfast.  07/29/19  Yes Emokpae, Courage, MD  riTUXimab (RITUXAN) 100 MG/10ML injection Inject into the vein.   Yes [provider]  gabapentin (NEURONTIN) 300 MG capsule Take 1 capsule (300 mg total) by mouth 3 (three) times daily. Patient not taking:  Reported on 08/05/2019 07/29/19   Roxan Hockey, MD  ondansetron (ZOFRAN) 4 MG tablet Take 1 tablet (4 mg total) by mouth every 6 (six) hours as needed for nausea. Patient not taking: Reported on 08/05/2019 07/29/19   Roxan Hockey, MD  predniSONE (DELTASONE) 10 MG tablet Take 1.5 tablets (15 mg total) by mouth daily with breakfast. Patient not taking: Reported on 08/05/2019 07/29/19   Roxan Hockey, MD  ranitidine (ZANTAC) 300 MG tablet Take 1 tablet (300 mg total) by mouth at bedtime. Patient not taking: Reported on 08/05/2019 07/29/19 07/28/20  Roxan Hockey, MD  tamsulosin (FLOMAX) 0.4 MG CAPS capsule Take 1 capsule (0.4 mg total) by mouth daily after supper. Patient not taking: Reported on 08/05/2019 07/29/19   Roxan Hockey, MD    Allergies    Methylprednisolone, Other, and Statins  Review of Systems   Review of Systems  Constitutional: Negative for appetite change and fatigue.  HENT: Negative for congestion, ear discharge and sinus pressure.   Eyes: Negative for discharge.  Respiratory: Negative for cough.   Cardiovascular: Negative for chest pain.  Gastrointestinal: Positive for abdominal pain and diarrhea.  Genitourinary: Negative for frequency and hematuria.  Musculoskeletal: Negative for back pain.  Skin: Negative for rash.  Neurological: Negative for seizures and headaches.  Psychiatric/Behavioral: Negative for hallucinations.    Physical Exam Updated Vital Signs BP (!) 153/94   Pulse 72   Temp (!) 97.4 F (36.3 C)   Resp 16   Ht 6\' 1"  (1.854 m)   Wt 103.4 kg   SpO2 100%   BMI 30.08 kg/m   Physical Exam Vitals and nursing note reviewed.  Constitutional:      Appearance: He is well-developed.  HENT:     Head: Normocephalic.     Nose: Nose normal.  Eyes:     General: No scleral icterus.    Conjunctiva/sclera: Conjunctivae normal.  Neck:     Thyroid: No thyromegaly.  Cardiovascular:     Rate and Rhythm: Normal rate and regular rhythm.      Heart sounds: No murmur. No friction rub. No gallop.   Pulmonary:     Breath sounds: No stridor. No wheezing or rales.  Chest:     Chest wall: No tenderness.  Abdominal:     General: There is no distension.     Tenderness: There is no abdominal tenderness. There is no rebound.  Musculoskeletal:        General: Normal range of motion.     Cervical back: Neck supple.  Lymphadenopathy:     Cervical: No cervical adenopathy.  Skin:    Findings: No erythema or rash.  Neurological:     Mental Status: He is alert and oriented to person, place, and time.     Motor: No abnormal muscle tone.  Coordination: Coordination normal.  Psychiatric:        Behavior: Behavior normal.     ED Results / Procedures / Treatments   Labs (all labs ordered are listed, but only abnormal results are displayed) Labs Reviewed  CBC WITH DIFFERENTIAL/PLATELET - Abnormal; Notable for the following components:      Result Value   RBC 4.07 (*)    Hemoglobin 12.3 (*)    RDW 15.6 (*)    All other components within normal limits  BASIC METABOLIC PANEL - Abnormal; Notable for the following components:   Glucose, Bld 105 (*)    Calcium 8.7 (*)    All other components within normal limits  GI PATHOGEN PANEL BY PCR, STOOL  C DIFFICILE QUICK SCREEN W PCR REFLEX    EKG None  Radiology US Venous Img Lower Bilateral (DVT)  Result Date: 08/05/2019 CLINICAL DATA:  Bilateral lower extremity edema. EXAM: BILATERAL LOWER EXTREMITY VENOUS DOPPLER ULTRASOUND TECHNIQUE: Gray-scale sonography with graded compression, as well as color Doppler and duplex ultrasound were performed to evaluate the lower extremity deep venous systems from the level of the common femoral vein and including the common femoral, femoral, profunda femoral, popliteal and calf veins including the posterior tibial, peroneal and gastrocnemius veins when visible. The superficial great saphenous vein was also interrogated. Spectral Doppler was  utilized to evaluate flow at rest and with distal augmentation maneuvers in the common femoral, femoral and popliteal veins. COMPARISON:  None. FINDINGS: RIGHT LOWER EXTREMITY Common Femoral Vein: No evidence of thrombus. Normal compressibility, respiratory phasicity and response to augmentation. Saphenofemoral Junction: No evidence of thrombus. Normal compressibility and flow on color Doppler imaging. Profunda Femoral Vein: No evidence of thrombus. Normal compressibility and flow on color Doppler imaging. Femoral Vein: No evidence of thrombus. Normal compressibility, respiratory phasicity and response to augmentation. Popliteal Vein: No evidence of thrombus. Normal compressibility, respiratory phasicity and response to augmentation. Calf Veins: No evidence of thrombus. Normal compressibility and flow on color Doppler imaging. Superficial Great Saphenous Vein: No evidence of thrombus. Normal compressibility. Venous Reflux:  None. Other Findings: No evidence of superficial thrombophlebitis or abnormal fluid collection. LEFT LOWER EXTREMITY Common Femoral Vein: No evidence of thrombus. Normal compressibility, respiratory phasicity and response to augmentation. Saphenofemoral Junction: No evidence of thrombus. Normal compressibility and flow on color Doppler imaging. Profunda Femoral Vein: No evidence of thrombus. Normal compressibility and flow on color Doppler imaging. Femoral Vein: No evidence of thrombus. Normal compressibility, respiratory phasicity and response to augmentation. Popliteal Vein: No evidence of thrombus. Normal compressibility, respiratory phasicity and response to augmentation. Calf Veins: No evidence of thrombus. Normal compressibility and flow on color Doppler imaging. Superficial Great Saphenous Vein: No evidence of thrombus. Normal compressibility. Venous Reflux:  None. Other Findings: No evidence of superficial thrombophlebitis or abnormal fluid collection. IMPRESSION: No evidence of deep  venous thrombosis in either lower extremity. Electronically Signed   By: Aletta Edouard M.D.   On: 08/05/2019 17:30   DG Chest Portable 1 View  Result Date: 08/05/2019 CLINICAL DATA:  Shortness of breath EXAM: PORTABLE CHEST 1 VIEW COMPARISON:  July 28, 2019 FINDINGS: Lungs are clear. Heart is upper normal in size with pulmonary vascularity normal. No adenopathy. No bone lesions. IMPRESSION: No edema or consolidation.  Heart upper normal in size. Electronically Signed   By: Lowella Grip III M.D.   On: 08/05/2019 11:31    Procedures Procedures (including critical care time)  Medications Ordered in ED Medications  sodium chloride 0.9 % bolus 1,000  mL (1,000 mLs Intravenous New Bag/Given 08/06/19 2044)  iohexol (OMNIPAQUE) 300 MG/ML solution 100 mL (100 mLs Intravenous Contrast Given 08/06/19 2056)  ondansetron (ZOFRAN) injection 4 mg (4 mg Intravenous Given 08/06/19 2216)    ED Course  I have reviewed the triage vital signs and the nursing notes.  Pertinent labs & imaging results that were available during my care of the patient were reviewed by me and considered in my medical decision making (see chart for details).   Labs unremarkable.  CT scan negative.  Patient had no stools during the 8 hours he was in the emergency department.  He will collect a diarrhea stool and bring it back to the lab and follow-up with his gastroenterologist MDM Rules/Calculators/A&P                       Final Clinical Impression(s) / ED Diagnoses Final diagnoses:  Diarrhea of infectious origin    Rx / DC Orders ED Discharge Orders    None       Milton Ferguson, MD 08/06/19 2303

## 2019-08-06 NOTE — Discharge Instructions (Addendum)
Bring your stool specimen into the lab and make sure you follow-up with your gastroenterologist next week

## 2019-08-06 NOTE — ED Notes (Signed)
Patient transported to CT 

## 2019-08-06 NOTE — ED Notes (Signed)
Patient given specimen cup for stool sample. Lab notified and orders are at the lab. Patient given instructions to return sample to the lab.

## 2019-08-07 ENCOUNTER — Other Ambulatory Visit: Payer: Self-pay

## 2019-08-07 ENCOUNTER — Emergency Department (HOSPITAL_COMMUNITY): Payer: PPO

## 2019-08-07 ENCOUNTER — Emergency Department (HOSPITAL_COMMUNITY)
Admission: EM | Admit: 2019-08-07 | Discharge: 2019-08-07 | Disposition: A | Discharge status: Home / Self Care | Payer: PPO | Attending: Emergency Medicine | Admitting: Emergency Medicine

## 2019-08-07 ENCOUNTER — Encounter (HOSPITAL_COMMUNITY): Payer: Self-pay

## 2019-08-07 DIAGNOSIS — Z7409 Other reduced mobility: Secondary | ICD-10-CM | POA: Diagnosis not present

## 2019-08-07 DIAGNOSIS — E2749 Other adrenocortical insufficiency: Secondary | ICD-10-CM | POA: Diagnosis not present

## 2019-08-07 DIAGNOSIS — K219 Gastro-esophageal reflux disease without esophagitis: Secondary | ICD-10-CM | POA: Diagnosis not present

## 2019-08-07 DIAGNOSIS — M5136 Other intervertebral disc degeneration, lumbar region: Secondary | ICD-10-CM | POA: Diagnosis not present

## 2019-08-07 DIAGNOSIS — M545 Low back pain: Secondary | ICD-10-CM | POA: Diagnosis not present

## 2019-08-07 DIAGNOSIS — R4182 Altered mental status, unspecified: Secondary | ICD-10-CM | POA: Diagnosis not present

## 2019-08-07 DIAGNOSIS — M9973 Connective tissue and disc stenosis of intervertebral foramina of lumbar region: Secondary | ICD-10-CM | POA: Diagnosis not present

## 2019-08-07 DIAGNOSIS — K626 Ulcer of anus and rectum: Secondary | ICD-10-CM | POA: Diagnosis not present

## 2019-08-07 DIAGNOSIS — K529 Noninfective gastroenteritis and colitis, unspecified: Secondary | ICD-10-CM | POA: Diagnosis not present

## 2019-08-07 DIAGNOSIS — R4020 Unspecified coma: Secondary | ICD-10-CM | POA: Diagnosis not present

## 2019-08-07 DIAGNOSIS — M546 Pain in thoracic spine: Secondary | ICD-10-CM | POA: Diagnosis not present

## 2019-08-07 DIAGNOSIS — R197 Diarrhea, unspecified: Secondary | ICD-10-CM | POA: Diagnosis not present

## 2019-08-07 DIAGNOSIS — R1084 Generalized abdominal pain: Secondary | ICD-10-CM | POA: Diagnosis not present

## 2019-08-07 DIAGNOSIS — R110 Nausea: Secondary | ICD-10-CM

## 2019-08-07 DIAGNOSIS — G7281 Critical illness myopathy: Secondary | ICD-10-CM | POA: Diagnosis not present

## 2019-08-07 DIAGNOSIS — E669 Obesity, unspecified: Secondary | ICD-10-CM | POA: Diagnosis not present

## 2019-08-07 DIAGNOSIS — G8929 Other chronic pain: Secondary | ICD-10-CM | POA: Diagnosis not present

## 2019-08-07 DIAGNOSIS — K5289 Other specified noninfective gastroenteritis and colitis: Secondary | ICD-10-CM | POA: Diagnosis not present

## 2019-08-07 DIAGNOSIS — K640 First degree hemorrhoids: Secondary | ICD-10-CM | POA: Diagnosis not present

## 2019-08-07 DIAGNOSIS — R1013 Epigastric pain: Secondary | ICD-10-CM | POA: Diagnosis not present

## 2019-08-07 DIAGNOSIS — E876 Hypokalemia: Secondary | ICD-10-CM | POA: Diagnosis not present

## 2019-08-07 DIAGNOSIS — Z6830 Body mass index (BMI) 30.0-30.9, adult: Secondary | ICD-10-CM | POA: Diagnosis not present

## 2019-08-07 DIAGNOSIS — R404 Transient alteration of awareness: Secondary | ICD-10-CM | POA: Diagnosis not present

## 2019-08-07 DIAGNOSIS — R55 Syncope and collapse: Secondary | ICD-10-CM | POA: Insufficient documentation

## 2019-08-07 DIAGNOSIS — R10814 Left lower quadrant abdominal tenderness: Secondary | ICD-10-CM | POA: Diagnosis not present

## 2019-08-07 DIAGNOSIS — G729 Myopathy, unspecified: Secondary | ICD-10-CM | POA: Diagnosis not present

## 2019-08-07 DIAGNOSIS — S22058A Other fracture of T5-T6 vertebra, initial encounter for closed fracture: Secondary | ICD-10-CM | POA: Diagnosis not present

## 2019-08-07 DIAGNOSIS — K6289 Other specified diseases of anus and rectum: Secondary | ICD-10-CM | POA: Diagnosis not present

## 2019-08-07 DIAGNOSIS — I10 Essential (primary) hypertension: Secondary | ICD-10-CM | POA: Diagnosis not present

## 2019-08-07 DIAGNOSIS — Z85828 Personal history of other malignant neoplasm of skin: Secondary | ICD-10-CM | POA: Diagnosis not present

## 2019-08-07 DIAGNOSIS — Z7952 Long term (current) use of systemic steroids: Secondary | ICD-10-CM | POA: Diagnosis not present

## 2019-08-07 DIAGNOSIS — Z79899 Other long term (current) drug therapy: Secondary | ICD-10-CM | POA: Diagnosis not present

## 2019-08-07 DIAGNOSIS — M5489 Other dorsalgia: Secondary | ICD-10-CM | POA: Diagnosis not present

## 2019-08-07 DIAGNOSIS — Z20828 Contact with and (suspected) exposure to other viral communicable diseases: Secondary | ICD-10-CM | POA: Diagnosis not present

## 2019-08-07 DIAGNOSIS — R10813 Right lower quadrant abdominal tenderness: Secondary | ICD-10-CM | POA: Diagnosis not present

## 2019-08-07 LAB — CBC WITH DIFFERENTIAL/PLATELET
Abs Immature Granulocytes: 0.07 10*3/uL (ref 0.00–0.07)
Basophils Absolute: 0 10*3/uL (ref 0.0–0.1)
Basophils Relative: 1 %
Eosinophils Absolute: 0.1 10*3/uL (ref 0.0–0.5)
Eosinophils Relative: 1 %
HCT: 39.4 % (ref 39.0–52.0)
Hemoglobin: 12.5 g/dL — ABNORMAL LOW (ref 13.0–17.0)
Immature Granulocytes: 1 %
Lymphocytes Relative: 11 %
Lymphs Abs: 0.9 10*3/uL (ref 0.7–4.0)
MCH: 30.8 pg (ref 26.0–34.0)
MCHC: 31.7 g/dL (ref 30.0–36.0)
MCV: 97 fL (ref 80.0–100.0)
Monocytes Absolute: 0.8 10*3/uL (ref 0.1–1.0)
Monocytes Relative: 10 %
Neutro Abs: 6.2 10*3/uL (ref 1.7–7.7)
Neutrophils Relative %: 76 %
Platelets: 283 10*3/uL (ref 150–400)
RBC: 4.06 MIL/uL — ABNORMAL LOW (ref 4.22–5.81)
RDW: 15.7 % — ABNORMAL HIGH (ref 11.5–15.5)
WBC: 8.2 10*3/uL (ref 4.0–10.5)
nRBC: 0 % (ref 0.0–0.2)

## 2019-08-07 LAB — URINALYSIS, ROUTINE W REFLEX MICROSCOPIC
Bilirubin Urine: NEGATIVE
Glucose, UA: 50 mg/dL — AB
Hgb urine dipstick: NEGATIVE
Ketones, ur: NEGATIVE mg/dL
Leukocytes,Ua: NEGATIVE
Nitrite: NEGATIVE
Protein, ur: NEGATIVE mg/dL
Specific Gravity, Urine: 1.01 (ref 1.005–1.030)
pH: 6 (ref 5.0–8.0)

## 2019-08-07 LAB — TROPONIN I (HIGH SENSITIVITY)
Troponin I (High Sensitivity): 4 ng/L (ref <18)
Troponin I (High Sensitivity): 5 ng/L (ref <18)

## 2019-08-07 LAB — COMPREHENSIVE METABOLIC PANEL
ALT: 47 U/L — ABNORMAL HIGH (ref 0–44)
AST: 26 U/L (ref 15–41)
Albumin: 3.7 g/dL (ref 3.5–5.0)
Alkaline Phosphatase: 68 U/L (ref 38–126)
Anion gap: 10 (ref 5–15)
BUN: 10 mg/dL (ref 8–23)
CO2: 25 mmol/L (ref 22–32)
Calcium: 8.5 mg/dL — ABNORMAL LOW (ref 8.9–10.3)
Chloride: 103 mmol/L (ref 98–111)
Creatinine, Ser: 0.91 mg/dL (ref 0.61–1.24)
GFR calc Af Amer: >60 mL/min (ref >60)
GFR calc non Af Amer: >60 mL/min (ref >60)
Glucose, Bld: 222 mg/dL — ABNORMAL HIGH (ref 70–99)
Potassium: 3.2 mmol/L — ABNORMAL LOW (ref 3.5–5.1)
Sodium: 138 mmol/L (ref 135–145)
Total Bilirubin: 1 mg/dL (ref 0.3–1.2)
Total Protein: 6.4 g/dL — ABNORMAL LOW (ref 6.5–8.1)

## 2019-08-07 LAB — CBG MONITORING, ED
Glucose-Capillary: 65 mg/dL — ABNORMAL LOW (ref 70–99)
Glucose-Capillary: 117 mg/dL — ABNORMAL HIGH (ref 70–99)

## 2019-08-07 LAB — LIPASE, BLOOD: Lipase: 35 U/L (ref 11–51)

## 2019-08-07 MED ORDER — SODIUM CHLORIDE 0.9 % IV BOLUS
1000.0000 mL | Freq: Once | INTRAVENOUS | Status: AC
  Administered 2019-08-07: 1000 mL via INTRAVENOUS

## 2019-08-07 MED ORDER — HYDROMORPHONE HCL 1 MG/ML IJ SOLN
1.0000 mg | Freq: Once | INTRAMUSCULAR | Status: DC
  Filled 2019-08-07: qty 1

## 2019-08-07 MED ORDER — DEXTROSE 50 % IV SOLN: 1.0000 | Freq: Once | INTRAVENOUS | Status: DC

## 2019-08-07 MED ORDER — HYDROMORPHONE HCL 1 MG/ML IJ SOLN
1.0000 mg | Freq: Once | INTRAMUSCULAR | Status: AC
  Administered 2019-08-07: 1 mg via INTRAMUSCULAR

## 2019-08-07 MED ORDER — POTASSIUM CHLORIDE CRYS ER 20 MEQ PO TBCR
40.0000 meq | EXTENDED_RELEASE_TABLET | Freq: Once | ORAL | Status: AC
  Administered 2019-08-07: 40 meq via ORAL
  Filled 2019-08-07: qty 2

## 2019-08-07 MED ORDER — LOPERAMIDE HCL 2 MG PO CAPS: 2.0000 mg | ORAL_CAPSULE | Freq: Four times a day (QID) | ORAL | 0 refills | Status: DC | PRN

## 2019-08-07 MED ORDER — DEXTROSE 50 % IV SOLN
1.0000 | Freq: Once | INTRAVENOUS | Status: AC
  Administered 2019-08-07: 50 mL via INTRAVENOUS

## 2019-08-07 MED ORDER — ONDANSETRON HCL 4 MG PO TABS: 4.0000 mg | ORAL_TABLET | Freq: Three times a day (TID) | ORAL | 0 refills | Status: DC | PRN

## 2019-08-07 NOTE — ED Notes (Signed)
Pt given glass of water, pt refuses food.

## 2019-08-07 NOTE — ED Triage Notes (Signed)
Pt brought to ED via [facility] for unresponsive. Pt was having a BM on toilet and passed out on toilet, found unresponsive by wife. Pt responsive to painful stimuli per EMS. Sternal rub done, pt responded.

## 2019-08-07 NOTE — ED Provider Notes (Signed)
[facility] EMERGENCY DEPARTMENT Provider Note   CSN: [id number] Arrival date & time: 08/07/19  1710     History Chief Complaint  Patient presents with  . Unresponsive    Jacob Hunter is a 63 y.o. male with history of arthritis, spinal stenosis, hyperlipidemia, necrotizing myopathy/polymyositis presents brought in by EMS for evaluation of acutely altered mental status.  Per EMS the patient was reportedly having a bowel movement on the toilet and lost consciousness while on the toilet was found unresponsive by the wife but became responsive to painful stimuli she will bleed after being found by the wife.  She called for EMS.  On initial assessment patient had his eyes closed but responded immediately to painful stimuli and opened up his eyes.  He exhibits cool extremities but is afebrile.  He speaks in a soft voice.  He tells me that he remembers being on the toilet attempting to have a bowel movement but does not remember what happened after that.  He was seen and evaluated in the ED yesterday and the day before for evaluation of diarrhea and was unable to produce a stool sample for testing at both visits.  He was recently admitted for back pain and pneumonia and has completed his antibiotics.  He tells me that for the last week he has had upwards of 7 watery bowel movements daily, had a little bit of blood in his stool yesterday only.  Has had nausea but no vomiting.  No fevers or chills at home.  CT scan of the abdomen pelvis performed yesterday showed no evidence of acute intra-abdominal or pelvic pathology to explain his symptoms.  Denies chest pain has had mild shortness of breath and cough but tested negative for Covid while in the hospital.  Denies headache, numbness or weakness of the extremities.  He is also complaining of mid back pain and has a history of multiple spine surgeries.  At his most recent hospital admission where he was discharged on 07/29/2019 MRI showed moderate central  canal stenosis at L2-3 due to a disc bulge and ligamentum flavum thickening increased compared to prior MRI but no acute changes otherwise.  Neurosurgery was consulted and did not recommend any surgical intervention at the time but did recommend pain management.  His home pain medicine regimen includes MS Contin 15 mg every 12 hours, OxyIR 5 mg every 6 hours, Neurontin 300 mg p.o. 3 times daily.  The history is provided by the patient and medical records.       Past Medical History:  Diagnosis Date  . Arthritis   . Hypercholesteremia   . Knee pain   . Necrotizing myopathy   . Polymyositis [facility])     Patient Active Problem List   Diagnosis Date Noted  . Back pain 07/26/2019  . Spinal stenosis 07/26/2019  . Polymyositis ([place]) 07/26/2019  . Pain in left knee 07/21/2018  . Anxiety and depression 12/30/2015  . Impaired mobility and activities of daily living 12/30/2015  . 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 Bilateral leg weakness 01/24/2015  . Elevated CPK 01/24/2015  . Transaminitis 10/24/2014    Past Surgical History:  Procedure Laterality Date  . ANKLE SURGERY Left   . KNEE SURGERY Left        Family History  Problem Relation Age of Onset  . Hypertension Father   . Hypercholesterolemia Father   . Hypercholesterolemia Sister   . Hypertension Mother   . Breast cancer Sister   . Colon cancer Neg Hx   .  Liver disease Neg Hx     Social History   Tobacco Use  . Smoking status: Never Smoker  . Smokeless tobacco: Never Used  Substance Use Topics  . Alcohol use: No    Alcohol/week: 0.0 standard drinks    Comment: Last ETOH was 1-2 drinks in 1990.  . Drug use: No    Home Medications Prior to Admission medications   Medication Sig Start Date End Date Taking? Authorizing Provider  acetaminophen (TYLENOL) 500 MG tablet Take 500 mg by mouth every 6 (six) hours as needed.   Yes [provider]  ascorbic acid (VITAMIN C) 500 MG tablet Take 1,000 mg by mouth  daily.    Yes [provider]  calcium-vitamin D (OSCAL WITH D) 500-200 MG-UNIT tablet Take 1 tablet by mouth daily with breakfast. 07/30/19  Yes Emokpae, Courage, MD  Coenzyme Q10 (CO Q 10) 10 MG CAPS Take 10 mg by mouth daily.   Yes [provider]  fluconazole (DIFLUCAN) 100 MG tablet Take 100 mg by mouth daily. 08/02/19  Yes [provider]  FLUoxetine (PROZAC) 20 MG capsule Take 20 mg by mouth daily.   Yes [provider]  folic acid (FOLVITE) 1 MG tablet Take 1 tablet (1 mg total) by mouth daily. 07/29/19  Yes Emokpae, Courage, MD  guaiFENesin (MUCINEX) 600 MG 12 hr tablet Take 1 tablet (600 mg total) by mouth 2 (two) times daily for 10 days. 07/29/19 08/08/19 Yes Emokpae, Courage, MD  methocarbamol (ROBAXIN) 750 MG tablet Take 1 tablet (750 mg total) by mouth 4 (four) times daily. 07/29/19  Yes Emokpae, Courage, MD  methotrexate (RHEUMATREX) 2.5 MG tablet Take 25 mg by mouth once a week. Caution:Chemotherapy. Protect from light.(monday) 10 tabs total   Yes [provider]  predniSONE (DELTASONE) 20 MG tablet Take 2 tablets (40 mg total) by mouth daily with breakfast. Patient taking differently: Take 30 mg by mouth daily with breakfast.  07/29/19  Yes Emokpae, Courage, MD  gabapentin (NEURONTIN) 300 MG capsule Take 1 capsule (300 mg total) by mouth 3 (three) times daily. Patient not taking: Reported on 08/05/2019 07/29/19   Roxan Hockey, MD  loperamide (IMODIUM) 2 MG capsule Take 1 capsule (2 mg total) by mouth 4 (four) times daily as needed for diarrhea or loose stools. 08/07/19   Brentton Wardlow A, PA-C  ondansetron (ZOFRAN) 4 MG tablet Take 1 tablet (4 mg total) by mouth every 8 (eight) hours as needed for nausea or vomiting. 08/07/19   Nils Flack, Rolondo Pierre A, PA-C  predniSONE (DELTASONE) 10 MG tablet Take 1.5 tablets (15 mg total) by mouth daily with breakfast. Patient not taking: Reported on 08/05/2019 07/29/19   Roxan Hockey, MD  ranitidine  (ZANTAC) 300 MG tablet Take 1 tablet (300 mg total) by mouth at bedtime. Patient not taking: Reported on 08/05/2019 07/29/19 07/28/20  Roxan Hockey, MD  riTUXimab (RITUXAN) 100 MG/10ML injection Inject into the vein.    [provider]  tamsulosin (FLOMAX) 0.4 MG CAPS capsule Take 1 capsule (0.4 mg total) by mouth daily after supper. Patient not taking: Reported on 08/05/2019 07/29/19   Roxan Hockey, MD    Allergies    Methylprednisolone, Other, and Statins  Review of Systems   Review of Systems  Constitutional: Negative for chills and fever.  Respiratory: Positive for cough and shortness of breath.   Cardiovascular: Negative for chest pain.  Gastrointestinal: Positive for abdominal pain, diarrhea and nausea. Negative for vomiting.  Musculoskeletal: Positive for back pain.  Neurological:  Negative for weakness, numbness and headaches.  All other systems reviewed and are negative.   Physical Exam Updated Vital Signs BP (!) 168/94   Pulse 76   Temp 98.7 F (37.1 C) (Axillary)   Resp 17   Ht 6\' 1"  (1.854 m)   Wt 103.4 kg   SpO2 100%   BMI 30.08 kg/m   Physical Exam Vitals and nursing note reviewed.  Constitutional:      General: He is not in acute distress.    Appearance: He is well-developed.  HENT:     Head: Normocephalic and atraumatic.     Comments: No Battle's signs, no raccoon's eyes, no rhinorrhea.No tenderness to palpation of the face or skull. No deformity, crepitus, or swelling noted.  Eyes:     General:        Right eye: No discharge.        Left eye: No discharge.     Extraocular Movements: Extraocular movements intact.     Conjunctiva/sclera: Conjunctivae normal.     Pupils: Pupils are equal, round, and reactive to light.     Comments: Pupils are myotic but equal and responsive to light bilaterally  Neck:     Vascular: No JVD.     Trachea: No tracheal deviation.  Cardiovascular:     Rate and Rhythm: Normal rate and regular rhythm.    Pulmonary:     Effort: Pulmonary effort is normal.     Breath sounds: Normal breath sounds.  Chest:     Chest wall: No tenderness.  Abdominal:     General: Abdomen is protuberant. Bowel sounds are normal. There is no distension.     Palpations: Abdomen is soft.     Tenderness: There is generalized abdominal tenderness and tenderness in the epigastric area.     Comments: Maximally tender to palpation in the epigastrium  Musculoskeletal:     Cervical back: Normal range of motion and neck supple.     Comments: Midline low back pain at the level of L2-3 with para lumbar muscle tenderness bilaterally.  No deformity, crepitus, or step-off.  5/5 strength of BUE and BLE major muscle groups.  Skin:    General: Skin is warm and dry.     Findings: No erythema.  Neurological:     Mental Status: He is alert and oriented to person, place, and time.     Comments: Mental Status:  Alert, thought content appropriate, able to give a coherent history. Speech fluent without evidence of aphasia. Able to follow 2 step commands without difficulty.  Cranial Nerves:  II:  Peripheral visual fields grossly normal, pupils equal, round, reactive to light III,IV, VI: ptosis not present, extra-ocular motions intact bilaterally  V,VII: smile symmetric, facial light touch sensation equal VIII: hearing grossly normal to voice  X: uvula elevates symmetrically  XI: bilateral shoulder shrug symmetric and strong XII: midline tongue extension without fassiculations Motor:  Normal tone. 5/5 strength of BUE and BLE major muscle groups including strong and equal grip strength and dorsiflexion/plantar flexion Sensory: light touch normal in all extremities.   Psychiatric:        Behavior: Behavior normal.     ED Results / Procedures / Treatments   Labs (all labs ordered are listed, but only abnormal results are displayed) Labs Reviewed  COMPREHENSIVE METABOLIC PANEL - Abnormal; Notable for the following components:       Result Value   Potassium 3.2 (*)    Glucose, Bld 222 (*)    Calcium  8.5 (*)    Total Protein 6.4 (*)    ALT 47 (*)    All other components within normal limits  CBC WITH DIFFERENTIAL/PLATELET - Abnormal; Notable for the following components:   RBC 4.06 (*)    Hemoglobin 12.5 (*)    RDW 15.7 (*)    All other components within normal limits  URINALYSIS, ROUTINE W REFLEX MICROSCOPIC - Abnormal; Notable for the following components:   Glucose, UA 50 (*)    All other components within normal limits  CBG MONITORING, ED - Abnormal; Notable for the following components:   Glucose-Capillary 65 (*)    All other components within normal limits  CBG MONITORING, ED - Abnormal; Notable for the following components:   Glucose-Capillary 117 (*)    All other components within normal limits  LIPASE, BLOOD  TROPONIN I (HIGH SENSITIVITY)  TROPONIN I (HIGH SENSITIVITY)    EKG EKG Interpretation  Date/Time:  Saturday August 07 2019 17:12:58 EST Ventricular Rate:  74 PR Interval:    QRS Duration: 113 QT Interval:  425 QTC Calculation: 472 R Axis:   67 Text Interpretation: Sinus rhythm Left ventricular hypertrophy Probable inferior infarct, old No STEMI Confirmed by Nanda Quinton [phone]) on 08/07/2019 7:36:50 PM   Radiology CT ABDOMEN PELVIS WO CONTRAST  Result Date: 08/06/2019 CLINICAL DATA:  Acute abdominal pain with neutropenia, lower EXAM: CT ABDOMEN AND PELVIS WITHOUT CONTRAST TECHNIQUE: Multidetector CT imaging of the abdomen and pelvis was performed following the standard protocol without IV contrast. COMPARISON:  July 25, 2019 FINDINGS: Lower chest: The visualized heart size within normal limits. No pericardial fluid/thickening. No hiatal hernia. The visualized portions of the lungs are clear. Hepatobiliary: Although limited due to lack of intravenous contrast multiple low-density lesions are again noted throughout the liver parenchyma., these are likely cysts. No evidence of  calcified gallstones or biliary ductal dilatation. Pancreas:  Unremarkable.  No surrounding inflammatory changes. Spleen: Normal in size. Although limited due to the lack of intravenous contrast, normal in appearance. Adrenals/Urinary Tract: Both adrenal glands appear normal. The kidneys and collecting system appear normal without evidence of urinary tract calculus or hydronephrosis. Bladder is unremarkable. Stomach/Bowel: The stomach, small bowel, and colon are normal in appearance. Scattered colonic diverticula are noted without diverticulitis. Foot no inflammatory changes or obstructive findings. appendix is normal. Vascular/Lymphatic: There are no enlarged abdominal or pelvic lymph nodes. Scattered aortic atherosclerotic calcifications are seen without aneurysmal dilatation. Reproductive: The prostate is unremarkable. Other: Small fat containing inguinal hernias are seen. Musculoskeletal: No acute or significant osseous findings. IMPRESSION: No acute intra-abdominal or pelvic pathology to explain the patient's symptoms Aortic Atherosclerosis (ICD10-I70.0). Electronically Signed   By: Prudencio Pair M.D.   On: 08/06/2019 21:58   DG Chest 2 View  Result Date: 08/07/2019 CLINICAL DATA:  Unresponsive. EXAM: CHEST - 2 VIEW COMPARISON:  08/05/2019 FINDINGS: AP and lateral views obtained. The lungs are clear without focal pneumonia, edema, pneumothorax or pleural effusion. The cardiopericardial silhouette is within normal limits for size. The visualized bony structures of the thorax are intact. Telemetry leads overlie the chest. IMPRESSION: No active cardiopulmonary disease. Electronically Signed   By: Misty Stanley M.D.   On: 08/07/2019 18:15    Procedures Procedures (including critical care time)  Medications Ordered in ED Medications  dextrose 50 % solution 50 mL (50 mLs Intravenous Given 08/07/19 1723)  sodium chloride 0.9 % bolus 1,000 mL (0 mLs Intravenous Stopped 08/07/19 1940)  potassium chloride  SA (KLOR-CON) CR tablet 40 mEq (  40 mEq Oral Given 08/07/19 1940)  HYDROmorphone (DILAUDID) injection 1 mg (1 mg Intramuscular Given 08/07/19 2124)    ED Course  I have reviewed the triage vital signs and the nursing notes.  Pertinent labs & imaging results that were available during my care of the patient were reviewed by me and considered in my medical decision making (see chart for details).    MDM Rules/Calculators/A&P                      Patient presenting for evaluation after syncopal episode while attempting to have a bowel movement on the commode.  He is afebrile, intermittently hypertensive in the ED.  Nontoxic in appearance.  He was brought in by EMS was noted to be alert and oriented x4, arousable to voice and painful stimuli.  No head injury noted.  Signs of serious head trauma and he has no complaint of headache, vision changes, numbness or weakness of the extremities.  He has midline lumbar spine tenderness at the level where there is known canal stenosis and ligamentum flavum thickening; follows with neurosurgery through [facility] and is prescribed numerous controlled substances and muscle relaxers for his chronic back pain.  This is his third visit in 3 days, has been seen for diarrhea but has not been able to produce a stool sample in the ED during this time.  No peritoneal signs on examination of the abdomen.  CT scan yesterday showed no acute surgical abdominal pathology.  Lab work today reviewed by me shows no leukocytosis, mild stable anemia, no metabolic derangements.  His initial CBG was a little low so he was given 1 amp of D50 with improvement in his blood glucose.  His potassium is a little low today so we will replenish this orally which he tolerated without difficulty.  No other acute abnormalities noted.  Serial troponins are negative and EKG shows normal sinus rhythm, no acute ischemic abnormalities.  Doubt ACS/MI causing his syncope.  Doubt PE, dissection, cardiac  tamponade, esophageal rupture, or pneumothorax.  He was recently treated for pneumonia with antibiotics and repeat chest x-ray today shows no acute cardiopulmonary abnormalities. Has tested negative for COVID.  With regards to his back pain he is ambulatory without difficulty and I doubt cauda equina, spinal abscess or acute traumatic injury.  He has an appointment scheduled to see his neurologist who manages his back pain in early January.  With regards to his abdominal pain his examination is reassuring with no peritoneal signs and serial abdominal examinations are benign.  He is tolerating p.o. fluids without difficulty as well as potassium tablets but refused food in the ED.  He states that he plans to give his PCP a stool sample for further testing.  Doubt acute surgical abdominal pathology and I do not feel that he requires emergent reimaging at this time since he had imaging yesterday.  We discussed symptomatic management.  I will also refer him to GI for follow-up.  Suspect he likely had an episode of vasovagal syncope while having a bowel movement on the commode.  He was given IV fluids in the ED.  Discussed strict ED return precautions. Patient verbalized understanding of and agreement with plan and is safe for discharge home at this time.   Patient was seen and evaluated by Dr. Laverta [place] who agrees with assessment and plan at this time.  Final Clinical Impression(s) / ED Diagnoses Final diagnoses:  Syncope and collapse  Diarrhea of presumed infectious origin  Nausea  Hypokalemia    Rx / DC Orders ED Discharge Orders         Ordered    ondansetron (ZOFRAN) 4 MG tablet  Every 8 hours PRN     08/07/19 2058    loperamide (IMODIUM) 2 MG capsule  4 times daily PRN     08/07/19 2058    Ambulatory referral to Gastroenterology     08/07/19 2058           Renita Papa, PA-C 08/08/19 1328    Long, Wonda Olds, MD 08/08/19 [id number]

## 2019-08-07 NOTE — Discharge Instructions (Signed)
Continue taking your home medications as prescribed.  You can take Zofran as needed for nausea and vomiting.  Let this medicine dissolve under your tongue.  Wait around 10 to 15 minutes to give it time to work.  Eat a diet of bland foods that will not upset your stomach (see attached).  You can also take Imodium as needed for diarrhea if your diarrhea is very severe.  Your work-up today was reassuring.  No evidence of heart attack and the remainder of your blood work is stable.  I suspect you had what is called vasovagal syncope when a specific nerve is stimulated while having a bowel movement and can cause passing out sometimes.  I recommend close follow-up with your primary care provider or a gastroenterologist in the next 3 to 5 days for reevaluation of your symptoms and discussion of your diagnoses.  I would also recommend outpatient stool studies and your PCP or the gastroenterologist can order these.  Return to the emergency department if any concerning signs or symptoms develop such as high fevers, persistent vomiting, severe pain or recurrent loss of consciousness.

## 2019-08-07 NOTE — ED Notes (Signed)
Pt. Ambulated with a waddle. Pt. Stated he was sore.

## 2019-08-07 NOTE — ED Notes (Signed)
Family given update 

## 2019-08-08 DIAGNOSIS — R1013 Epigastric pain: Secondary | ICD-10-CM | POA: Diagnosis not present

## 2019-08-08 DIAGNOSIS — S22058A Other fracture of T5-T6 vertebra, initial encounter for closed fracture: Secondary | ICD-10-CM | POA: Diagnosis not present

## 2019-08-08 DIAGNOSIS — M9973 Connective tissue and disc stenosis of intervertebral foramina of lumbar region: Secondary | ICD-10-CM | POA: Diagnosis not present

## 2019-08-08 DIAGNOSIS — Z20828 Contact with and (suspected) exposure to other viral communicable diseases: Secondary | ICD-10-CM | POA: Diagnosis not present

## 2019-08-08 DIAGNOSIS — R197 Diarrhea, unspecified: Secondary | ICD-10-CM | POA: Diagnosis not present

## 2019-08-08 DIAGNOSIS — M5136 Other intervertebral disc degeneration, lumbar region: Secondary | ICD-10-CM | POA: Diagnosis not present

## 2019-08-08 DIAGNOSIS — M545 Low back pain: Secondary | ICD-10-CM | POA: Diagnosis not present

## 2019-08-09 DIAGNOSIS — G7281 Critical illness myopathy: Secondary | ICD-10-CM | POA: Diagnosis not present

## 2019-08-09 DIAGNOSIS — R1013 Epigastric pain: Secondary | ICD-10-CM | POA: Diagnosis not present

## 2019-08-09 DIAGNOSIS — M545 Low back pain: Secondary | ICD-10-CM | POA: Diagnosis not present

## 2019-08-09 DIAGNOSIS — R197 Diarrhea, unspecified: Secondary | ICD-10-CM | POA: Diagnosis not present

## 2019-08-11 ENCOUNTER — Other Ambulatory Visit: Payer: Self-pay | Admitting: *Deleted

## 2019-08-16 ENCOUNTER — Telehealth (HOSPITAL_COMMUNITY): Payer: Self-pay | Admitting: Physical Therapy

## 2019-08-16 NOTE — Telephone Encounter (Signed)
pt wanted to cancel for 08/17/19  no reason given he said he would call back at a later date to reschedule

## 2019-08-17 ENCOUNTER — Ambulatory Visit (HOSPITAL_COMMUNITY): Payer: PPO | Admitting: Physical Therapy

## 2019-08-17 DIAGNOSIS — M7989 Other specified soft tissue disorders: Secondary | ICD-10-CM | POA: Diagnosis not present

## 2019-08-17 DIAGNOSIS — Z7952 Long term (current) use of systemic steroids: Secondary | ICD-10-CM | POA: Diagnosis not present

## 2019-08-17 DIAGNOSIS — G7289 Other specified myopathies: Secondary | ICD-10-CM | POA: Diagnosis not present

## 2019-08-17 DIAGNOSIS — Z79899 Other long term (current) drug therapy: Secondary | ICD-10-CM | POA: Diagnosis not present

## 2019-08-17 DIAGNOSIS — M19072 Primary osteoarthritis, left ankle and foot: Secondary | ICD-10-CM | POA: Diagnosis not present

## 2019-08-17 DIAGNOSIS — R600 Localized edema: Secondary | ICD-10-CM | POA: Diagnosis not present

## 2019-08-17 DIAGNOSIS — G7281 Critical illness myopathy: Secondary | ICD-10-CM | POA: Diagnosis not present

## 2019-08-17 DIAGNOSIS — M7732 Calcaneal spur, left foot: Secondary | ICD-10-CM | POA: Diagnosis not present

## 2019-08-17 NOTE — Patient Outreach (Signed)
Called and left a message for Mr. Jacob Hunter. Reminded that I had talked with him last spring and that I wanted to know how can we assist him in his recovery from his recent illness.   [name]. [name], MSN, [facility] Gerontological Nurse Practitioner [facility] Care Management [phone]

## 2019-08-18 DIAGNOSIS — M1000 Idiopathic gout, unspecified site: Secondary | ICD-10-CM | POA: Diagnosis not present

## 2019-08-19 ENCOUNTER — Other Ambulatory Visit: Payer: Self-pay | Admitting: *Deleted

## 2019-08-19 NOTE — Patient Outreach (Signed)
Telephone outreach to assess pt health status post multiple ED visits for GI disturbances. Jacob Hunter says he is going much better. He is currently on an antibiotic for gout ( I wonder if he meant an antiinflammatory) He was put on an antibiotic for his persistent diarrhea while in the ED and he did not test + for Cdiff. He will have a follow up colonoscopy in Feb. He agreed to check up with him mid February for follow up.  [name]. [name], MSN, [facility] Gerontological Nurse Practitioner [facility] Care Management [phone]

## 2019-08-23 DIAGNOSIS — R197 Diarrhea, unspecified: Secondary | ICD-10-CM | POA: Diagnosis not present

## 2019-08-23 DIAGNOSIS — M1000 Idiopathic gout, unspecified site: Secondary | ICD-10-CM | POA: Diagnosis not present

## 2019-08-30 ENCOUNTER — Telehealth: Payer: Self-pay | Admitting: *Deleted

## 2019-08-30 NOTE — Telephone Encounter (Signed)
Called pt and informed him that we needed to cancel his procedure due to Covid surge.  Pt aware that we will call him back once we get clearance to reschedule.

## 2019-09-10 ENCOUNTER — Telehealth: Payer: Self-pay | Admitting: *Deleted

## 2019-09-10 NOTE — Telephone Encounter (Signed)
Pt rescheduled his procedure to 10/05/2019.  Pt is aware that I will mail him out prep instructions and Covid information.

## 2019-09-13 ENCOUNTER — Telehealth: Payer: Self-pay | Admitting: Internal Medicine

## 2019-09-13 DIAGNOSIS — G7281 Critical illness myopathy: Secondary | ICD-10-CM | POA: Diagnosis not present

## 2019-09-13 NOTE — Telephone Encounter (Signed)
Pt came in office to sign a release to get records from [place] on 09/09/2019. He is scheduled procedure with [facility] on 10/05/2019. Pt requested someone to go over the results of his records from [place]. I am giving the records to AS since she did the triage.

## 2019-09-14 ENCOUNTER — Telehealth: Payer: Self-pay | Admitting: *Deleted

## 2019-09-14 DIAGNOSIS — K523 Indeterminate colitis: Secondary | ICD-10-CM | POA: Diagnosis not present

## 2019-09-14 DIAGNOSIS — M109 Gout, unspecified: Secondary | ICD-10-CM | POA: Diagnosis not present

## 2019-09-14 DIAGNOSIS — E785 Hyperlipidemia, unspecified: Secondary | ICD-10-CM | POA: Diagnosis not present

## 2019-09-14 DIAGNOSIS — Z79899 Other long term (current) drug therapy: Secondary | ICD-10-CM | POA: Diagnosis not present

## 2019-09-14 DIAGNOSIS — Z125 Encounter for screening for malignant neoplasm of prostate: Secondary | ICD-10-CM | POA: Diagnosis not present

## 2019-09-14 DIAGNOSIS — G7289 Other specified myopathies: Secondary | ICD-10-CM | POA: Diagnosis not present

## 2019-09-14 NOTE — Telephone Encounter (Signed)
[facility] sent over fax request stating that they were out of prep kit.  Called pt and informed him that we could do a Miralax prep.  Advised pt of items needed to purchase OTC.  Informed pt that I would mail him the new prep instructions.  Pt voiced understanding.  Confirmed mailing address with pt.   

## 2019-09-14 NOTE — Telephone Encounter (Signed)
Glad he is feeling better.  He remains to be seen whether or not he had a recent infectious process or something else like new onset IBD  -  has gotten Solu-Medrol for his myopathy.  That would help IBD as well.  Original recommendation still stands-due for screening colonoscopy at this time.

## 2019-09-14 NOTE — Telephone Encounter (Signed)
Jacob Hunter reviewed records with [facility] and is calling pt to fu and see how he is doing.  He had recent Sigmoidoscopy.

## 2019-09-14 NOTE — Telephone Encounter (Signed)
Pt is aware of the date and time of his procedure. He is also aware of his prep instructions and stated he will go ahead and get the prep now from the pharmacy, asked him to not mix it until he is ready to do his prep. I advised him that he should call them first and ask them to get it ready since it was sent in in November and they probably put the rx on his profile. Pt verbalized understanding.

## 2019-09-14 NOTE — Telephone Encounter (Signed)
I reviewed pts records and it looks like he was admitted to [facility] on 12/13 with epigastric pain and pneumonia- he was given abx while in the hospital and to finish at home.  He went back to [facility] ED on 12/24 and 12/25 with c/o diarrhea 7-8x a day- he was unable to give a stool sample at both of those visits.  He went to [facility] ED again on 12/26 with diarrhea and syncope, upon discharge from [facility] he went to [facility] ED where he was admitted. cdiff and gi pathogen were negative. Pt had CT's and MRI's done at both [facility] and [facility]. No reason for diarrhea was found. Flex sig was done at [facility] with bx that showed "focal active colitis"  I called and spoke with the pt- he said he was given imodium and lomotil at [facility] and he is no longer taking either of those. He also stated he missed his solu-medrol treatments for necrotizing myopathy in December and January and has finally gotten it yesterday. Pt stated he is feeling so much better now and is not having any diarrhea and is able to eat anything he wants without having diarrhea. I explained to him that they only did a flex sig at [facility] and that he still needed to have the rest of his colon checked and he stated he understood and would like to proceed with colonoscopy on 10/05/19.

## 2019-09-15 ENCOUNTER — Other Ambulatory Visit (HOSPITAL_COMMUNITY): Payer: PPO

## 2019-09-17 ENCOUNTER — Ambulatory Visit (HOSPITAL_COMMUNITY): Admit: 2019-09-17 | Payer: PPO | Admitting: Internal Medicine

## 2019-09-17 ENCOUNTER — Encounter (HOSPITAL_COMMUNITY): Payer: Self-pay

## 2019-09-17 SURGERY — COLONOSCOPY
Anesthesia: Moderate Sedation

## 2019-09-21 DIAGNOSIS — F419 Anxiety disorder, unspecified: Secondary | ICD-10-CM | POA: Diagnosis not present

## 2019-09-21 DIAGNOSIS — G7281 Critical illness myopathy: Secondary | ICD-10-CM | POA: Diagnosis not present

## 2019-09-21 DIAGNOSIS — M5136 Other intervertebral disc degeneration, lumbar region: Secondary | ICD-10-CM | POA: Diagnosis not present

## 2019-09-21 DIAGNOSIS — E785 Hyperlipidemia, unspecified: Secondary | ICD-10-CM | POA: Diagnosis not present

## 2019-09-23 DIAGNOSIS — Z23 Encounter for immunization: Secondary | ICD-10-CM | POA: Diagnosis not present

## 2019-09-28 ENCOUNTER — Other Ambulatory Visit: Payer: Self-pay | Admitting: *Deleted

## 2019-09-28 NOTE — Patient Outreach (Signed)
Telephone outreach for follow up.  No answer this am but was able to leave a message and requested a return call.  [name]. [name], MSN, GNP-BC Gerontological Nurse Practitioner [facility] Care Management [phone]  Pt returned my call. He reports his gout cleared up. He has not had any more bleeding. He will have his colonoscopy next week. This had to be postponed because of the increased COVID cases.  He denies any other health need at this time. I have encouraged him to call me, if he has any problems, I am available to him.  [name]. [name], MSN, [facility] Gerontological Nurse Practitioner [facility] Care Management [phone]

## 2019-09-29 NOTE — Patient Outreach (Signed)
Telephone outreach.  Jacob Hunter reports he is doing much better. His gout episode has resolved. He has not had any further GI issues. His colonoscopy was rescheduled due to [place] and he will have that next week.  No further needs at this time.  [name]. [name], MSN, [facility] Gerontological Nurse Practitioner [facility] Care Management [phone]

## 2019-10-01 ENCOUNTER — Other Ambulatory Visit (HOSPITAL_COMMUNITY)
Admission: RE | Admit: 2019-10-01 | Discharge: 2019-10-01 | Disposition: A | Discharge status: Home / Self Care | Payer: PPO | Source: Ambulatory Visit | Attending: Internal Medicine | Admitting: Internal Medicine

## 2019-10-01 ENCOUNTER — Other Ambulatory Visit: Payer: Self-pay

## 2019-10-01 DIAGNOSIS — Z01812 Encounter for preprocedural laboratory examination: Secondary | ICD-10-CM | POA: Diagnosis not present

## 2019-10-01 DIAGNOSIS — Z20822 Contact with and (suspected) exposure to covid-19: Secondary | ICD-10-CM | POA: Diagnosis not present

## 2019-10-01 LAB — SARS CORONAVIRUS 2 (TAT 6-24 HRS): SARS Coronavirus 2: NEGATIVE

## 2019-10-05 ENCOUNTER — Encounter (HOSPITAL_COMMUNITY)
Admission: RE | Disposition: A | Discharge status: Home / Self Care | Payer: Self-pay | Source: Home / Self Care | Attending: Internal Medicine

## 2019-10-05 ENCOUNTER — Other Ambulatory Visit: Payer: Self-pay

## 2019-10-05 ENCOUNTER — Encounter (HOSPITAL_COMMUNITY): Payer: Self-pay | Admitting: Internal Medicine

## 2019-10-05 ENCOUNTER — Ambulatory Visit (HOSPITAL_COMMUNITY)
Admission: RE | Admit: 2019-10-05 | Discharge: 2019-10-05 | Disposition: A | Discharge status: Home / Self Care | Payer: PPO | Attending: Internal Medicine | Admitting: Internal Medicine

## 2019-10-05 DIAGNOSIS — K635 Polyp of colon: Secondary | ICD-10-CM | POA: Diagnosis not present

## 2019-10-05 DIAGNOSIS — D120 Benign neoplasm of cecum: Secondary | ICD-10-CM | POA: Insufficient documentation

## 2019-10-05 DIAGNOSIS — Z79899 Other long term (current) drug therapy: Secondary | ICD-10-CM | POA: Diagnosis not present

## 2019-10-05 DIAGNOSIS — M3320 Polymyositis, organ involvement unspecified: Secondary | ICD-10-CM | POA: Insufficient documentation

## 2019-10-05 DIAGNOSIS — Z1211 Encounter for screening for malignant neoplasm of colon: Secondary | ICD-10-CM | POA: Diagnosis not present

## 2019-10-05 DIAGNOSIS — Z7952 Long term (current) use of systemic steroids: Secondary | ICD-10-CM | POA: Insufficient documentation

## 2019-10-05 DIAGNOSIS — D123 Benign neoplasm of transverse colon: Secondary | ICD-10-CM | POA: Insufficient documentation

## 2019-10-05 DIAGNOSIS — L509 Urticaria, unspecified: Secondary | ICD-10-CM | POA: Insufficient documentation

## 2019-10-05 DIAGNOSIS — Z8719 Personal history of other diseases of the digestive system: Secondary | ICD-10-CM | POA: Insufficient documentation

## 2019-10-05 HISTORY — PX: COLONOSCOPY: SHX5424

## 2019-10-05 HISTORY — PX: POLYPECTOMY: SHX5525

## 2019-10-05 SURGERY — COLONOSCOPY
Anesthesia: Moderate Sedation

## 2019-10-05 MED ORDER — ONDANSETRON HCL 4 MG/2ML IJ SOLN
INTRAMUSCULAR | Status: AC
  Filled 2019-10-05: qty 2

## 2019-10-05 MED ORDER — MIDAZOLAM HCL 5 MG/5ML IJ SOLN
INTRAMUSCULAR | Status: DC | PRN
  Administered 2019-10-05: 1 mg via INTRAVENOUS
  Administered 2019-10-05 (×2): 2 mg via INTRAVENOUS
  Administered 2019-10-05: 1 mg via INTRAVENOUS

## 2019-10-05 MED ORDER — DIPHENHYDRAMINE HCL 50 MG/ML IJ SOLN
INTRAMUSCULAR | Status: DC | PRN
  Administered 2019-10-05: 12.5 mg via INTRAVENOUS

## 2019-10-05 MED ORDER — SODIUM CHLORIDE 0.9 % IV SOLN: INTRAVENOUS | Status: DC

## 2019-10-05 MED ORDER — MIDAZOLAM HCL 5 MG/5ML IJ SOLN
INTRAMUSCULAR | Status: AC
  Filled 2019-10-05: qty 10

## 2019-10-05 MED ORDER — MEPERIDINE HCL 100 MG/ML IJ SOLN
INTRAMUSCULAR | Status: DC | PRN
  Administered 2019-10-05: 25 mg via INTRAVENOUS

## 2019-10-05 MED ORDER — DIPHENHYDRAMINE HCL 50 MG/ML IJ SOLN
INTRAMUSCULAR | Status: AC
  Filled 2019-10-05: qty 1

## 2019-10-05 MED ORDER — MEPERIDINE HCL 50 MG/ML IJ SOLN
INTRAMUSCULAR | Status: AC
  Filled 2019-10-05: qty 1

## 2019-10-05 MED ORDER — STERILE WATER FOR IRRIGATION IR SOLN
Status: DC | PRN
  Administered 2019-10-05: 1.5 mL

## 2019-10-05 MED ORDER — ONDANSETRON HCL 4 MG/2ML IJ SOLN
INTRAMUSCULAR | Status: DC | PRN
  Administered 2019-10-05: 4 mg via INTRAVENOUS

## 2019-10-05 NOTE — Discharge Instructions (Signed)
°  Colonoscopy Discharge Instructions  Read the instructions outlined below and refer to this sheet in the next few weeks. These discharge instructions provide you with general information on caring for yourself after you leave the hospital. Your doctor may also give you specific instructions. While your treatment has been planned according to the most current medical practices available, unavoidable complications occasionally occur. If you have any problems or questions after discharge, call Dr. Gala Romney at [phone]. ACTIVITY  You may resume your regular activity, but move at a slower pace for the next 24 hours.   Take frequent rest periods for the next 24 hours.   Walking will help get rid of the air and reduce the bloated feeling in your belly (abdomen).   No driving for 24 hours (because of the medicine (anesthesia) used during the test).    Do not sign any important legal documents or operate any machinery for 24 hours (because of the anesthesia used during the test).  NUTRITION  Drink plenty of fluids.   You may resume your normal diet as instructed by your doctor.   Begin with a light meal and progress to your normal diet. Heavy or fried foods are harder to digest and may make you feel sick to your stomach (nauseated).   Avoid alcoholic beverages for 24 hours or as instructed.  MEDICATIONS  You may resume your normal medications unless your doctor tells you otherwise.  WHAT YOU CAN EXPECT TODAY  Some feelings of bloating in the abdomen.   Passage of more gas than usual.   Spotting of blood in your stool or on the toilet paper.  IF YOU HAD POLYPS REMOVED DURING THE COLONOSCOPY:  No aspirin products for 7 days or as instructed.   No alcohol for 7 days or as instructed.   Eat a soft diet for the next 24 hours.  FINDING OUT THE RESULTS OF YOUR TEST Not all test results are available during your visit. If your test results are not back during the visit, make an appointment  with your caregiver to find out the results. Do not assume everything is normal if you have not heard from your caregiver or the medical facility. It is important for you to follow up on all of your test results.  SEEK IMMEDIATE MEDICAL ATTENTION IF:  You have more than a spotting of blood in your stool.   Your belly is swollen (abdominal distention).   You are nauseated or vomiting.   You have a temperature over 101.   You have abdominal pain or discomfort that is severe or gets worse throughout the day.   Diverticulosis and colon polyp information provided  Further recommendations to follow pending review of pathology report  At patient request I called wife, Ephram Kornegay at [phone]  -Reviewed results  Patient needs an appointment in the office to see [place] in 2 to 3 weeks to review ongoing GI issues  Rash developed with Demerol.  Would not ever take Demerol again(We will add to allergy list ).

## 2019-10-05 NOTE — H&P (Signed)
@LOGO @   Primary Care Physician:  Asencion Noble, MD Primary Gastroenterologist:  Dr. Gala Romney  Pre-Procedure History & Physical: HPI:  Jacob Hunter is a 64 y.o. male is here for a screening colonoscopy.   Negative colonoscopy 10 years ago (hyperplastic polyp).  No bowel symptoms.  No family history of colon cancer.  Past Medical History:  Diagnosis Date  . Arthritis   . Knee pain   . Necrotizing myopathy   . Polymyositis [facility])     Past Surgical History:  Procedure Laterality Date  . ANKLE SURGERY Left   . KNEE SURGERY Left   . left wrist surgery      Prior to Admission medications   Medication Sig Start Date End Date Taking? Authorizing Provider  acetaminophen (TYLENOL) 500 MG tablet Take 500 mg by mouth every 6 (six) hours as needed.   Yes [provider]  ascorbic acid (VITAMIN C) 500 MG tablet Take 500 mg by mouth daily.    Yes [provider]  calcium-vitamin D (OSCAL WITH D) 500-200 MG-UNIT tablet Take 1 tablet by mouth daily with breakfast. 07/30/19  Yes Emokpae, Courage, MD  Coenzyme Q10 (CO Q 10) 100 MG CAPS Take 100 mg by mouth daily.    Yes [provider]  FLUoxetine (PROZAC) 20 MG capsule Take 20 mg by mouth daily.   Yes [provider]  folic acid (FOLVITE) 1 MG tablet Take 1 tablet (1 mg total) by mouth daily. 07/29/19  Yes Roxan Hockey, MD  methotrexate (RHEUMATREX) 2.5 MG tablet Take 25 mg by mouth every Monday.    Yes [provider]  predniSONE (DELTASONE) 20 MG tablet Take 2 tablets (40 mg total) by mouth daily with breakfast. Patient taking differently: Take 20 mg by mouth daily with breakfast.  07/29/19  Yes Emokpae, Courage, MD  riTUXimab (RITUXAN) 100 MG/10ML injection Inject 1,000 mg into the vein once a week.    Yes [provider]  gabapentin (NEURONTIN) 300 MG capsule Take 1 capsule (300 mg total) by mouth 3 (three) times daily. Patient not taking: Reported on 08/05/2019 07/29/19   Roxan Hockey, MD  loperamide (IMODIUM) 2 MG capsule Take 1 capsule (2 mg total) by mouth 4 (four) times daily as needed for diarrhea or loose stools. 08/07/19   Fawze, Mina A, PA-C  methocarbamol (ROBAXIN) 750 MG tablet Take 1 tablet (750 mg total) by mouth 4 (four) times daily. Patient not taking: Reported on 09/28/2019 07/29/19   Roxan Hockey, MD  ondansetron (ZOFRAN) 4 MG tablet Take 1 tablet (4 mg total) by mouth every 8 (eight) hours as needed for nausea or vomiting. 08/07/19   Nils Flack, Mina A, PA-C  predniSONE (DELTASONE) 10 MG tablet Take 1.5 tablets (15 mg total) by mouth daily with breakfast. Patient not taking: Reported on 08/05/2019 07/29/19   Roxan Hockey, MD  ranitidine (ZANTAC) 300 MG tablet Take 1 tablet (300 mg total) by mouth at bedtime. Patient not taking: Reported on 08/05/2019 07/29/19 07/28/20  Roxan Hockey, MD  tamsulosin (FLOMAX) 0.4 MG CAPS capsule Take 1 capsule (0.4 mg total) by mouth daily after supper. Patient not taking: Reported on 08/05/2019 07/29/19   Roxan Hockey, MD    Allergies as of 09/10/2019 - Review Complete 08/07/2019  Allergen Reaction Noted  . Methylprednisolone Anaphylaxis 10/14/2015  . Other  10/14/2015  . Statins Other (See Comments) 10/14/2015    Family History  Problem Relation Age of Onset  . Hypertension Father   . Hypercholesterolemia Father   .  Hypercholesterolemia Sister   . Hypertension Mother   . Breast cancer Sister   . Colon cancer Neg Hx   . Liver disease Neg Hx     Social History   Socioeconomic History  . Marital status: Married    Spouse name: Not on file  . Number of children: Not on file  . Years of education: Not on file  . Highest education level: Not on file  Occupational History  . Not on file  Tobacco Use  . Smoking status: Never Smoker  . Smokeless tobacco: Never Used  Substance and Sexual Activity  . Alcohol use: No    Alcohol/week: 0.0 standard drinks    Comment: Last ETOH was 1-2 drinks in  1990.  . Drug use: No  . Sexual activity: Yes    Birth control/protection: None  Other Topics Concern  . Not on file  Social History Narrative  . Not on file   Social Determinants of Health   Financial Resource Strain:   . Difficulty of Paying Living Expenses: Not on file  Food Insecurity:   . Worried About Charity fundraiser in the Last Year: Not on file  . Ran Out of Food in the Last Year: Not on file  Transportation Needs:   . Lack of Transportation (Medical): Not on file  . Lack of Transportation (Non-Medical): Not on file  Physical Activity:   . Days of Exercise per Week: Not on file  . Minutes of Exercise per Session: Not on file  Stress:   . Feeling of Stress : Not on file  Social Connections:   . Frequency of Communication with Friends and Family: Not on file  . Frequency of Social Gatherings with Friends and Family: Not on file  . Attends Religious Services: Not on file  . Active Member of Clubs or Organizations: Not on file  . Attends Archivist Meetings: Not on file  . Marital Status: Not on file  Intimate Partner Violence:   . Fear of Current or Ex-Partner: Not on file  . Emotionally Abused: Not on file  . Physically Abused: Not on file  . Sexually Abused: Not on file    Review of Systems: See HPI, otherwise negative ROS  Physical Exam: BP 128/81   Pulse 71   Temp 97.6 F (36.4 C) (Oral)   Resp 14   Ht 6\' 1"  (1.854 m)   Wt 97.5 kg   SpO2 96%   BMI 28.37 kg/m  General:   Alert,  Well-developed, well-nourished, pleasant and cooperative in NAD Lungs:  Clear throughout to auscultation.   No wheezes, crackles, or rhonchi. No acute distress. Heart:  Regular rate and rhythm; no murmurs, clicks, rubs,  or gallops. Abdomen:  Soft, nontender and nondistended. No masses, hepatosplenomegaly or hernias noted. Normal bowel sounds, without guarding, and without rebound.    Impression/Plan: Jacob Hunter is now here to undergo a screening  colonoscopy.  Average rescreening examination.  Risks, benefits, limitations, imponderables and alternatives regarding colonoscopy have been reviewed with the patient. Questions have been answered. All parties agreeable.     Notice:  This dictation was prepared with Dragon dictation along with smaller phrase technology. Any transcriptional errors that result from this process are unintentional and may not be corrected upon review.

## 2019-10-05 NOTE — OR Nursing (Addendum)
Patient with hives above IV site where demerol and versed were being administered.  Verbal order Dr Gala Romney for benadryl 12.5 IV now.  Done  8cm x 3cm largest hive; smaller hive 2cm x 0.5cm

## 2019-10-05 NOTE — Op Note (Addendum)
[facility] Patient Name: Jacob Hunter Procedure Date: 10/05/2019 7:47 AM MRN: [id number] Date of Birth: Oct 02, 1955 Attending MD: Norvel Richards , MD CSN: [id number] Age: 65 Admit Type: Outpatient Procedure:                Colonoscopy Indications:              Screening for colorectal malignant neoplasm Providers:                Norvel Richards, MD, Jeanann Lewandowsky. Gwenlyn Perking RN, RN,                            Aram Candela Referring MD:              Medicines:                Midazolam 6 mg IV, Meperidine 25 mg IV Complications:            No immediate complications. Estimated Blood Loss:     Estimated blood loss was minimal. Procedure:                Pre-Anesthesia Assessment:                           - Prior to the procedure, a History and Physical                            was performed, and patient medications and                            allergies were reviewed. The patient's tolerance of                            previous anesthesia was also reviewed. The risks                            and benefits of the procedure and the sedation                            options and risks were discussed with the patient.                            All questions were answered, and informed consent                            was obtained. Prior Anticoagulants: The patient has                            taken no previous anticoagulant or antiplatelet                            agents. ASA Grade Assessment: II - A patient with                            mild systemic disease. After reviewing the risks  and benefits, the patient was deemed in                            satisfactory condition to undergo the procedure.                           After obtaining informed consent, the colonoscope                            was passed under direct vision. Throughout the                            procedure, the patient's blood pressure, pulse, and               oxygen saturations were monitored continuously. The                            CF-HQ190L ([id number]) scope was introduced through                            the anus and advanced to the the cecum, identified                            by appendiceal orifice and ileocecal valve. The                            colonoscopy was performed without difficulty. The                            patient tolerated the procedure well. The quality                            of the bowel preparation was adequate. Scope In: 9:16:20 AM Scope Out: 9:35:46 AM Scope Withdrawal Time: 0 hours 9 minutes 51 seconds  Total Procedure Duration: 0 hours 19 minutes 26 seconds  Findings:      The perianal and digital rectal examinations were normal.      Three semi-pedunculated polyps were found in the splenic flexure,       hepatic flexure and cecum. The polyps were 4 to 6 mm in size. These       polyps were removed with a cold snare. Resection and retrieval were       complete. Estimated blood loss was minimal.      The exam was otherwise without abnormality on direct and retroflexion       views. Impression:               - Three polyps at the splenic flexure, at the                            hepatic flexure and in the cecum, removed with a                            cold snare. Resected and retrieved.                           -  The examination was otherwise normal on direct                            and retroflexion views. Localized area of hives at                            IV site likely caused by a dose of Demerol.                            Resolved with Benadryl 12.5 mg IV Moderate Sedation:      Moderate (conscious) sedation was administered by the endoscopy nurse       and supervised by the endoscopist. The following parameters were       monitored: oxygen saturation, heart rate, blood pressure, and response       to care. Total physician intraservice time was 28 minutes. Recommendation:            - Patient has a contact number available for                            emergencies. The signs and symptoms of potential                            delayed complications were discussed with the                            patient. Return to normal activities tomorrow.                            Written discharge instructions were provided to the                            patient.                           - Advance diet as tolerated.                           - Repeat colonoscopy date to be determined after                            pending pathology results are reviewed for                            surveillance.                           - Return to GI office in 3 weeks. I discussed my                            findings and recommendations with Ike Bene.                            She tells me patient was admitted to [facility] a  couple of months ago with severe diarrhea.                            Describes a flex sig being done. No diagnosis being                            made. Patient states to me prior to the procedure                            that he was not having any GI issues. Wife is                            concerned about some persisting nonbloody watery                            diarrhea. Therefore, we will get him back in the                            office in 3 weeks and reassess any residual GI                            complaints. Avoid Demerol in the future. Procedure Code(s):        --- Professional ---                           [phone], Colonoscopy, flexible; with removal of                            tumor(s), polyp(s), or other lesion(s) by snare                            technique                           99153, Moderate sedation; each additional 15                            minutes intraservice time                           G0500, Moderate sedation services provided by the                            same physician  or other qualified health care                            professional performing a gastrointestinal                            endoscopic service that sedation supports,                            requiring the presence of an independent trained  observer to assist in the monitoring of the                            patient's level of consciousness and physiological                            status; initial 15 minutes of intra-service time;                            patient age 39 years or older (additional time may                            be reported with [phone], as appropriate) Diagnosis Code(s):        --- Professional ---                           Z12.11, Encounter for screening for malignant                            neoplasm of colon                           K63.5, Polyp of colon CPT copyright 2019 American Medical Association. All rights reserved. The codes documented in this report are preliminary and upon coder review may  be revised to meet current compliance requirements. Cristopher Estimable. Evolette Pendell, MD Norvel Richards, MD 10/05/2019 9:53:41 AM This report has been signed electronically. Number of Addenda: 0

## 2019-10-06 ENCOUNTER — Encounter: Payer: Self-pay | Admitting: Internal Medicine

## 2019-10-06 LAB — SURGICAL PATHOLOGY

## 2019-10-15 DIAGNOSIS — G7289 Other specified myopathies: Secondary | ICD-10-CM | POA: Diagnosis not present

## 2019-10-19 ENCOUNTER — Other Ambulatory Visit: Payer: Self-pay | Admitting: *Deleted

## 2019-10-19 DIAGNOSIS — G7289 Other specified myopathies: Secondary | ICD-10-CM | POA: Diagnosis not present

## 2019-10-19 DIAGNOSIS — Z79899 Other long term (current) drug therapy: Secondary | ICD-10-CM | POA: Diagnosis not present

## 2019-10-19 NOTE — Patient Outreach (Signed)
HTA Telephone follow up.  Mr. Baba reports he is doing fair. He feels rather run down lately. He has just returned from [facility] where he received his IV methylprednisoline that is scheduled every 28 days for his autoimmune necrotizing myopathy.  He did have his colonoscopy and will need to have his next one in 3 years.   We discussed if he has had the COVID19 vaccine or not and he says he has not and is a bit wary of it. He did have his flu vaccine in January. He will be following up with his specialist in May and will ask his opinion. I advised if I find some good evidence for having the vaccine for people in his situation I will forward that information to him.  I will follow up with Mr. Morison in June for one final call.  [name]. [name], MSN, [facility] Gerontological Nurse Practitioner [facility] Care Management [phone]

## 2019-10-21 ENCOUNTER — Encounter: Payer: Self-pay | Admitting: *Deleted

## 2019-10-21 DIAGNOSIS — G7289 Other specified myopathies: Secondary | ICD-10-CM | POA: Diagnosis not present

## 2019-10-21 NOTE — Patient Outreach (Signed)
Care coordination:  Called Dr. Melvyn Novas office to request his opinion regarding Mr. Marken getting the COVID19 vaccine. Left a message with his nurse and she called me back advising that Dr. Tillman Abide is advising all his immunocompromised pts get the vaccine.  Sent pt an email letting him know this. Will send him any future information/evidence that is available.  [name]. [name], MSN, [facility] Gerontological Nurse Practitioner [facility] Care Management [phone]

## 2019-10-25 ENCOUNTER — Encounter: Payer: Self-pay | Admitting: Gastroenterology

## 2019-10-25 ENCOUNTER — Ambulatory Visit (INDEPENDENT_AMBULATORY_CARE_PROVIDER_SITE_OTHER): Payer: PPO | Admitting: Gastroenterology

## 2019-10-25 ENCOUNTER — Other Ambulatory Visit: Payer: Self-pay

## 2019-10-25 DIAGNOSIS — K529 Noninfective gastroenteritis and colitis, unspecified: Secondary | ICD-10-CM | POA: Diagnosis not present

## 2019-10-25 MED ORDER — DICYCLOMINE HCL 10 MG PO CAPS: 10.0000 mg | ORAL_CAPSULE | Freq: Three times a day (TID) | ORAL | 1 refills | Status: DC

## 2019-10-25 NOTE — Progress Notes (Signed)
Primary Care Physician: Asencion Noble, MD  Primary Gastroenterologist:  Garfield Cornea, MD   Chief Complaint  Patient presents with  . Diarrhea    3-4 times after eating. No nausea/vomting. has tried imodium/pepto without relief  . Bloated    taking a lot tums  . Gas    HPI: Jacob Hunter is a 64 y.o. male here for follow-up of recent colonoscopy.  Patient initially was scheduled for screening colonoscopy back in November 2020, procedure delayed due to [facility] surge.  Patient has a history of necrotizing myopathy, spinal stenosis.  For his necrotizing myopathy is on immunosuppressive therapy.    Patient reports when his colonoscopy was scheduled he was having no issues with diarrhea.  However while waiting for procedure he did become ill.  Initially patient was hospitalized from December 13 through December 17 with acute onset epigastric pain, back pain, and fever.  Symptoms occurred after some heavy lifting.  In the ED his temperature was 103.  Covid negative.  CT angio chest, abdomen, pelvis unremarkable.  Chest x-ray suspicious for left lower lobe pneumonia at the time.  Provided Zosyn initially and then deescalated to Rocephin and Zithromax, discharged on Omnicef and azithromycin.  Again patient discharged on December 17.  He presented back a week later, seen in the ED 3 separate times on the 24th, 25th, 26.  Presented back to the ED on December 24 with 1 week history of diarrhea, associated with crampy abdominal pain, nausea.  He was released in return to the ED the following day on the 25th.  In the ED there are plans for checking C. difficile patient did not produce a stool during the 8 hours stay.  Again released to go home, return on December 26 with altered mental status.  Per EMS patient was having a bowel movement on the toilet and lost consciousness and found unresponsive by the wife.  Patient was released again on the 26th and at that point patient states that his wife took him  to [facility] ED at which point he was hospitalized.  Work-up at [facility]: CT abdomen pelvis without contrast August 06, 2019 no acute intra-abdominal pelvic pathology to explain his symptoms.  Exam limited due to lack of IV contrast with multiple low-density lesions again noted throughout the liver, likely cyst.  Work-up at [facility]: Flex sig showed 3 shallow-based ulcerations, possibly from enemas, unable to advance scope beyond sigmoid due to large stool burden. Random colon bx and bx from ulcers showed focal active colitis possibly drug-induced versus resolving infection, no chronicity.  GI pathogen panel and C. difficile are both negative.  TSH normal.  Covid negative.  Calprotectin normal at 38.6.  Cortisol was low at 0.9, in the setting of chronic daily prednisone, monthly Solu-Medrol.  Today patient reports that his diarrhea was much worse back in December but it is now although he has persistent symptoms.  Generally has postprandial loose stools.  Limits meals to just 3 times daily because he is trying to avoid diarrhea.  Since diarrhea started back in December he states he is lost 25 pounds.  He reports undergoing sigmoidoscopy while at [facility] and was told he had colitis.  He denies any abdominal pain.  He said rectal bleeding once.  Complains of abdominal noises and gas.  Imodium Pepto-Bismol does not control his diarrhea.  Colonoscopy February 2021 with 3 polyps removed, 2 were tubular adenomas, cecal polyp was  sessile serrated polyp.  Advised 3-year surveillance colonoscopy.  Current Outpatient Medications  Medication Sig Dispense Refill  . acetaminophen (TYLENOL) 500 MG tablet Take 500 mg by mouth every 6 (six) hours as needed.    [name] ascorbic acid (VITAMIN C) 500 MG tablet Take 500 mg by mouth daily.     . calcium carbonate (TUMS - DOSED IN MG ELEMENTAL CALCIUM) 500 MG chewable tablet daily.    . calcium-vitamin D (OSCAL  WITH D) 500-200 MG-UNIT tablet Take 1 tablet by mouth daily with breakfast. 30 tablet 2  . Coenzyme Q10 (CO Q 10) 100 MG CAPS Take 200 mg by mouth daily.     [name] FLUoxetine (PROZAC) 20 MG capsule Take 20 mg by mouth daily.    . folic acid (FOLVITE) 1 MG tablet Take 1 tablet (1 mg total) by mouth daily. 30 tablet 0  . methotrexate (RHEUMATREX) 2.5 MG tablet Take 25 mg by mouth every Monday.     . methylPREDNISolone sodium succinate (SOLU-MEDROL) 125 mg/2 mL injection Inject 350 mg into the vein. Every 5 weeks    . predniSONE (DELTASONE) 20 MG tablet Take 2 tablets (40 mg total) by mouth daily with breakfast. (Patient taking differently: Take 15 mg by mouth daily with breakfast. ) 10 tablet 0  . riTUXimab (RITUXAN) 100 MG/10ML injection Inject 1,000 mg into the vein once a week.      No current facility-administered medications for this visit.    Allergies as of 10/25/2019 - Review Complete 10/25/2019  Allergen Reaction Noted  . Methylprednisolone Anaphylaxis, Shortness Of Breath, and Rash 10/14/2015  . Statins Other (See Comments) 10/14/2015    ROS:  General: Negative for anorexia, weight loss, fever, chills, fatigue, weakness. ENT: Negative for hoarseness, difficulty swallowing , nasal congestion. CV: Negative for chest pain, angina, palpitations, dyspnea on exertion, peripheral edema.  Respiratory: Negative for dyspnea at rest, dyspnea on exertion, cough, sputum, wheezing.  GI: See history of present illness. GU:  Negative for dysuria, hematuria, urinary incontinence, urinary frequency, nocturnal urination.  Endo: Negative for unusual weight change.    Physical Examination:   BP 138/81   Pulse 84   Temp (!) 97.2 F (36.2 C) (Oral)   Ht 6\' 1"  (1.854 m)   Wt 226 lb (102.5 kg)   BMI 29.82 kg/m   General: Well-nourished, well-developed in no acute distress.  Eyes: No icterus. Mouth: Oropharyngeal mucosa moist and pink , no lesions erythema or exudate. Lungs: Clear to auscultation  bilaterally.  Heart: Regular rate and rhythm, no murmurs rubs or gallops.  Abdomen: Bowel sounds are normal, nontender, nondistended, no hepatosplenomegaly or masses, no abdominal bruits or hernia , no rebound or guarding.   Extremities: No lower extremity edema. No clubbing or deformities. Neuro: Alert and oriented x 4   Skin: Warm and dry, no jaundice.   Psych: Alert and cooperative, normal mood and affect.  Labs:  Lab Results  Component Value Date   CREATININE 0.91 08/07/2019   BUN 10 08/07/2019   NA 138 08/07/2019   K 3.2 (L) 08/07/2019   CL 103 08/07/2019   CO2 25 08/07/2019   Lab Results  Component Value Date   LIPASE 35 08/07/2019   Lab Results  Component Value Date   WBC 8.2 08/07/2019   HGB 12.5 (L) 08/07/2019   HCT 39.4 08/07/2019   MCV 97.0 08/07/2019   PLT 283 08/07/2019   Lab Results  Component Value Date   ALT 47 (H) 08/07/2019   AST 26  08/07/2019   ALKPHOS 68 08/07/2019   BILITOT 1.0 08/07/2019   Labs from October 19, 2018: White blood cell count 8500, hemoglobin 12.5, hematocrit 37, MCV 91.1, creatinine 0.9, platelets 167,000, albumin 3.9, total bilirubin 0.4, alkaline phosphatase 63, AST 28, ALT 34.  Imaging Studies: No results found.

## 2019-10-25 NOTE — Patient Instructions (Signed)
1. Try dicyclomine 10mg  before meals (30-60 minutes before) and at bedtime (up to four times daily) for diarrhea. Hold for constipation. 2. I will review records and let you know next step in your work up within the next 1-2 days.

## 2019-10-27 ENCOUNTER — Encounter: Payer: Self-pay | Admitting: Gastroenterology

## 2019-10-27 NOTE — Assessment & Plan Note (Signed)
Very pleasant 64 year old gentleman with chronic diarrhea which began back in December after hospitalized with pneumonia treated with multiple antibiotics.  Extensive evaluation as outlined.  Sigmoidoscopy done in December at [facility] indicated colitis without evidence of chronicity possibly drug-related versus resolving infection.  Patient notes that his diarrhea has significantly improved since that time but is persisting.  He also has lost a significant amount of weight of 25 pounds over the past 3 months.  C. difficile and GI pathogen panel was negative.  He is on chronic steroids and intermittent Solu-Medrol, had a low cortisol level 0.9 back in December during hospitalization.  Recent colonoscopy, several polyps removed.  At the time we were unaware of his history of chronic diarrhea therefore random colon biopsies not obtained nor terminal ileoscopy performed.  Question possible postinfectious IBS.Patient failed Pepto-Bismol and Imodium in the past.  Started him on dicyclomine 10 mg before every meal nightly.  If no response, continue further work-up.  Consider ruling out celiac disease.  Consider endocrinology evaluation for low cortisol level in the setting of chronic steroids.

## 2019-10-28 ENCOUNTER — Telehealth: Payer: Self-pay | Admitting: Gastroenterology

## 2019-10-28 DIAGNOSIS — G7289 Other specified myopathies: Secondary | ICD-10-CM | POA: Diagnosis not present

## 2019-10-28 DIAGNOSIS — K529 Noninfective gastroenteritis and colitis, unspecified: Secondary | ICD-10-CM

## 2019-10-28 NOTE — Telephone Encounter (Addendum)
Please let patient know that I reviewed all of his records at [facility], admission with diarrhea.  Stool tests were negative for infection including C. difficile.  Flexible sigmoidoscopy showed colitis, felt to be either drug-related or resolving infection but no evidence of chronicity, this is good.  They also noted that a hormone, cortisol, was low during that hospitalization.  This is likely from chronic steroid use but may need to have endocrinology evaluation to rule out adrenal insufficiency.  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  However if he does not have good response to dicyclomine, he will need to have further work-up i.e. exclude celiac disease, thyroid labs.  HAS PATIENT NOTED ANY IMPROVEMENT IN [place] ON DICYCLOMINE?

## 2019-10-28 NOTE — Telephone Encounter (Signed)
Lmom, waiting on a return call.  

## 2019-10-29 NOTE — Telephone Encounter (Signed)
Spoke with pt. Pt is aware that his records have been reviewed by [facility] and that he may need further work up. Pt states that in November and December 2020, he was having 10 bowel movements a day. Since pt has started Dicyclomine, he is having up to 4 bowel movements daily. Pt mentioned he saw some blood when he wiped and in the toilet yesterday 10/28/19. Blood was less that a tbs. Pt reports no pain, no active bleeding, no feelings of lightheadedness, no SOB, no N/v. Pt is aware that if he starts feeling any of those symptoms, he should go to the ED.

## 2019-11-04 DIAGNOSIS — G7289 Other specified myopathies: Secondary | ICD-10-CM | POA: Diagnosis not present

## 2019-11-08 NOTE — Addendum Note (Signed)
Addended by: Mahala Menghini on: 11/08/2019 01:20 PM   Modules accepted: Orders

## 2019-11-08 NOTE — Telephone Encounter (Signed)
Sorry for delay.   Let's have patient go ahead and have labs for thyroid and celiac.  TSH, free T4 TTG IgA and IgA level.   Continue bentyl.  I have entered labs.

## 2019-11-08 NOTE — Telephone Encounter (Signed)
Spoke with pt. Orders were mailed to pt.

## 2019-11-16 ENCOUNTER — Other Ambulatory Visit: Payer: Self-pay | Admitting: Gastroenterology

## 2019-11-16 DIAGNOSIS — K529 Noninfective gastroenteritis and colitis, unspecified: Secondary | ICD-10-CM | POA: Diagnosis not present

## 2019-11-17 LAB — TISSUE TRANSGLUTAMINASE, IGA: Transglutaminase IgA: <2 U/mL (ref 0–3)

## 2019-11-17 LAB — TSH+FREE T4
Free T4: 0.86 ng/dL (ref 0.82–1.77)
TSH: 2.81 u[IU]/mL (ref 0.450–4.500)

## 2019-11-17 LAB — IGA: IgA/Immunoglobulin A, Serum: 112 mg/dL (ref 61–437)

## 2019-11-23 DIAGNOSIS — G7289 Other specified myopathies: Secondary | ICD-10-CM | POA: Diagnosis not present

## 2019-12-21 DIAGNOSIS — Z7952 Long term (current) use of systemic steroids: Secondary | ICD-10-CM | POA: Diagnosis not present

## 2019-12-21 DIAGNOSIS — G7281 Critical illness myopathy: Secondary | ICD-10-CM | POA: Diagnosis not present

## 2019-12-21 DIAGNOSIS — G7289 Other specified myopathies: Secondary | ICD-10-CM | POA: Diagnosis not present

## 2019-12-21 DIAGNOSIS — R5383 Other fatigue: Secondary | ICD-10-CM | POA: Diagnosis not present

## 2019-12-21 DIAGNOSIS — Z79899 Other long term (current) drug therapy: Secondary | ICD-10-CM | POA: Diagnosis not present

## 2019-12-22 ENCOUNTER — Ambulatory Visit: Payer: PPO | Attending: Internal Medicine

## 2019-12-22 DIAGNOSIS — Z23 Encounter for immunization: Secondary | ICD-10-CM

## 2019-12-22 NOTE — Progress Notes (Signed)
   Covid-19 Vaccination Clinic  Name:  Jacob Hunter    MRN: [id number] DOB: May 03, 1956  12/22/2019  Mr. Colucci was observed post Covid-19 immunization for 30 minutes based on pre-vaccination screening without incident. He was provided with Vaccine Information Sheet and instruction to access the V-Safe system.   Mr. Zawislak was instructed to call 911 with any severe reactions post vaccine: [name] Difficulty breathing  . Swelling of face and throat  . A fast heartbeat  . A bad rash all over body  . Dizziness and weakness   Immunizations Administered    Name Date Dose VIS Date Route   Pfizer COVID-19 Vaccine 12/22/2019  9:57 AM 0.3 mL 10/06/2018 Intramuscular   Manufacturer: [place]   Lot: [id number]   [place]: [id number]

## 2019-12-28 DIAGNOSIS — G7289 Other specified myopathies: Secondary | ICD-10-CM | POA: Diagnosis not present

## 2019-12-28 DIAGNOSIS — Z79899 Other long term (current) drug therapy: Secondary | ICD-10-CM | POA: Diagnosis not present

## 2020-01-17 ENCOUNTER — Ambulatory Visit: Payer: PPO | Attending: Internal Medicine

## 2020-01-17 DIAGNOSIS — Z23 Encounter for immunization: Secondary | ICD-10-CM

## 2020-01-17 NOTE — Progress Notes (Signed)
   Covid-19 Vaccination Clinic  Name:  CLEVESTER HELZER    MRN: [id number] DOB: Aug 26, 1955  01/17/2020  Mr. Pittsley was observed post Covid-19 immunization for 30 minutes based on pre-vaccination screening without incident. He was provided with Vaccine Information Sheet and instruction to access the V-Safe system.   Mr. Keating was instructed to call 911 with any severe reactions post vaccine: [name] Difficulty breathing  . Swelling of face and throat  . A fast heartbeat  . A bad rash all over body  . Dizziness and weakness   Immunizations Administered    Name Date Dose VIS Date Route   Pfizer COVID-19 Vaccine 01/17/2020  9:59 AM 0.3 mL 10/06/2018 Intramuscular   Manufacturer: [employer]   Lot: [id number]   [place]: [id number]

## 2020-01-21 ENCOUNTER — Encounter: Payer: Self-pay | Admitting: Gastroenterology

## 2020-01-21 ENCOUNTER — Ambulatory Visit (INDEPENDENT_AMBULATORY_CARE_PROVIDER_SITE_OTHER): Payer: PPO | Admitting: Gastroenterology

## 2020-01-21 ENCOUNTER — Ambulatory Visit: Payer: PPO | Admitting: Internal Medicine

## 2020-01-21 ENCOUNTER — Other Ambulatory Visit: Payer: Self-pay

## 2020-01-21 DIAGNOSIS — Z8719 Personal history of other diseases of the digestive system: Secondary | ICD-10-CM | POA: Diagnosis not present

## 2020-01-21 NOTE — Patient Instructions (Signed)
I am glad you are doing well!  Continue to avoid trigger foods.   Please call if any recurrence of diarrhea.   We will arrange a colonoscopy in 2024!  It was a pleasure to see you today. I want to create trusting relationships with patients to provide genuine, compassionate, and quality care. I value your feedback. If you receive a survey regarding your visit,  I greatly appreciate you taking time to fill this out.   Annitta Needs, PhD, ANP-BC [facility] Gastroenterology

## 2020-01-21 NOTE — Progress Notes (Signed)
Referring Provider: Asencion Noble, MD Primary Care Physician:  Asencion Noble, MD Primary GI: Dr. Gala Romney   Chief Complaint  Patient presents with  . Diarrhea    HPI:   Jacob Hunter is a 64 y.o. male presenting today with a history of diarrhea beginning with acute onset in December after hospitalization with pneumonia, prior extensive evaluation as noted in the detailed note on October 25, 2019 when last in office. He did have a colonoscopy in Feb 2021, but we were unaware of the diarrhea at that time so no colonic biopsies were completed. It was felt he had possible post-infectious IBS as etiology for diarrhea. He was prescribed Bentyl at last visit. While at [facility], cortisol level noted to be low. He is on chronic steroids. TSH recently completed and normal. Celiac serologies also normal.   1 BM now daily. When eating fruits and veggies, has solid stool. With sweets will have softer more frequent stool. Had ham and eggs this morning, and notices he won't go till next day. Pancakes caused more urgent stool with syrup on pancakes. Not taking Bentyl, as this caused worsening diarrhea. He is back to his baseline. No GERD symptoms, denying dysphagia. No abdominal pain. No loss of appetite. Weight has fluctuated but on review of weights dating back to several years, he is near his baseline.   Past Medical History:  Diagnosis Date  . Arthritis   . Knee pain   . Necrotizing myopathy   . Polymyositis [facility])     Past Surgical History:  Procedure Laterality Date  . ANKLE SURGERY Left   . COLONOSCOPY N/A 10/05/2019   [place]: 3 colonic polyps removed, 2 were tubular adenomas, cecal polyp was sessile serrated polyp.  Advised 3-year surveillance colonoscopy.  [name] KNEE SURGERY Left   . left wrist surgery    . POLYPECTOMY  10/05/2019   Procedure: POLYPECTOMY;  Surgeon: Daneil Dolin, MD;  Location: [facility] SUITE;  Service: Endoscopy;;    Current Outpatient Medications  Medication Sig Dispense  Refill  . acetaminophen (TYLENOL) 500 MG tablet Take 500 mg by mouth every 6 (six) hours as needed.    [name] ascorbic acid (VITAMIN C) 500 MG tablet Take 500 mg by mouth daily.     . calcium carbonate (TUMS - DOSED IN MG ELEMENTAL CALCIUM) 500 MG chewable tablet daily.    . calcium-vitamin D (OSCAL WITH D) 500-200 MG-UNIT tablet Take 1 tablet by mouth daily with breakfast. 30 tablet 2  . Coenzyme Q10 (CO Q 10) 100 MG CAPS Take 200 mg by mouth daily.     . folic acid (FOLVITE) 1 MG tablet Take 1 tablet (1 mg total) by mouth daily. 30 tablet 0  . methotrexate (RHEUMATREX) 2.5 MG tablet Take 25 mg by mouth every Monday.     . methylPREDNISolone sodium succinate (SOLU-MEDROL) 125 mg/2 mL injection Inject 350 mg into the vein. Every 5 weeks    . predniSONE (DELTASONE) 20 MG tablet Take 2 tablets (40 mg total) by mouth daily with breakfast. (Patient taking differently: Take 15 mg by mouth daily with breakfast. ) 10 tablet 0  . riTUXimab (RITUXAN) 100 MG/10ML injection Inject 1,000 mg into the vein once a week.      No current facility-administered medications for this visit.    Allergies as of 01/21/2020 - Review Complete 01/21/2020  Allergen Reaction Noted  . Methylprednisolone Anaphylaxis, Shortness Of Breath, and Rash 10/14/2015  . Demerol [meperidine] Hives 10/27/2019  . Statins  Other (See Comments) 10/14/2015    Family History  Problem Relation Age of Onset  . Hypertension Father   . Hypercholesterolemia Father   . Hypercholesterolemia Sister   . Hypertension Mother   . Breast cancer Sister   . Colon cancer Neg Hx   . Liver disease Neg Hx     Social History   Socioeconomic History  . Marital status: Married    Spouse name: Not on file  . Number of children: Not on file  . Years of education: Not on file  . Highest education level: Not on file  Occupational History  . Not on file  Tobacco Use  . Smoking status: Never Smoker  . Smokeless tobacco: Never Used  Vaping Use  .  Vaping Use: Never used  Substance and Sexual Activity  . Alcohol use: No    Alcohol/week: 0.0 standard drinks    Comment: Last ETOH was 1-2 drinks in 1990.  . Drug use: No  . Sexual activity: Yes    Birth control/protection: None  Other Topics Concern  . Not on file  Social History Narrative  . Not on file   Social Determinants of Health   Financial Resource Strain:   . Difficulty of Paying Living Expenses:   Food Insecurity:   . Worried About Charity fundraiser in the Last Year:   . Arboriculturist in the Last Year:   Transportation Needs:   . Film/video editor (Medical):   [name] Lack of Transportation (Non-Medical):   Physical Activity:   . Days of Exercise per Week:   . Minutes of Exercise per Session:   Stress:   . Feeling of Stress :   Social Connections:   . Frequency of Communication with Friends and Family:   . Frequency of Social Gatherings with Friends and Family:   . Attends Religious Services:   . Active Member of Clubs or Organizations:   . Attends Archivist Meetings:   [name] Marital Status:     Review of Systems: Gen: Denies fever, chills, anorexia. Denies fatigue, weakness, weight loss.  CV: Denies chest pain, palpitations, syncope, peripheral edema, and claudication. Resp: Denies dyspnea at rest, cough, wheezing, coughing up blood, and pleurisy. GI: see HPI Derm: Denies rash, itching, dry skin Psych: Denies depression, anxiety, memory loss, confusion. No homicidal or suicidal ideation.  Heme: Denies bruising, bleeding, and enlarged lymph nodes.  Physical Exam: BP 125/74   Pulse 82   Temp 98.6 F (37 C) (Temporal)   Ht 6\' 1"  (1.854 m)   Wt 221 lb (100.2 kg)   BMI 29.16 kg/m  General:   Alert and oriented. No distress noted. Pleasant and cooperative.  Head:  Normocephalic and atraumatic. Eyes:  Conjuctiva clear without scleral icterus. Mouth:  Mask in place Abdomen:  +BS, soft, non-tender and non-distended. No rebound or guarding. No  HSM or masses noted. Msk:  Symmetrical without gross deformities. Normal posture. Extremities:  Without edema. Neurologic:  Alert and  oriented x4 Psych:  Alert and cooperative. Normal mood and affect.  ASSESSMENT: Jacob Hunter is a 64 y.o. male presenting today with history of likely post-infectious IBS, now doing well and back to baseline. Interestingly, he states dicyclomine worsened symptoms. He no longer is taking any medications for this and manages with his diet. Colonoscopy earlier this year with adenomas and will need early interval surveillance in 2024.   No other concerning signs/symptoms. We will see him back as needed and arrange  colonoscopy in 2024.    PLAN:   Return prn  Call if recurrent diarrhea  Colonoscopy 2024  Annitta Needs, PhD, [facility] Gastroenterology

## 2020-01-24 ENCOUNTER — Other Ambulatory Visit: Payer: Self-pay | Admitting: *Deleted

## 2020-01-24 NOTE — Progress Notes (Signed)
Cc'ed to pcp °

## 2020-01-24 NOTE — Patient Outreach (Addendum)
[facility]) Care Management  01/24/2020  Jacob Hunter September 26, 1955 [id number]  Final outreach call, was unsuccessful. Left a message that this would be my last outreach but he is welcome to contact me in the future should he have questions or a problem.  [name]. [name], MSN, GNP-BC Gerontological Nurse Practitioner [facility] Care Management [phone]   01/26/20 - Did not receive a return call. Will send closure letter.  [name]. [name], MSN, [facility] Gerontological Nurse Practitioner [facility] Care Management [phone]

## 2020-01-26 ENCOUNTER — Encounter: Payer: Self-pay | Admitting: *Deleted

## 2020-01-31 DIAGNOSIS — G7281 Critical illness myopathy: Secondary | ICD-10-CM | POA: Diagnosis not present

## 2020-02-01 DIAGNOSIS — G7281 Critical illness myopathy: Secondary | ICD-10-CM | POA: Diagnosis not present

## 2020-02-02 DIAGNOSIS — G7281 Critical illness myopathy: Secondary | ICD-10-CM | POA: Diagnosis not present

## 2020-02-02 DIAGNOSIS — Z79899 Other long term (current) drug therapy: Secondary | ICD-10-CM | POA: Diagnosis not present

## 2020-02-03 DIAGNOSIS — Z79899 Other long term (current) drug therapy: Secondary | ICD-10-CM | POA: Diagnosis not present

## 2020-02-03 DIAGNOSIS — G7281 Critical illness myopathy: Secondary | ICD-10-CM | POA: Diagnosis not present

## 2020-02-29 DIAGNOSIS — G7281 Critical illness myopathy: Secondary | ICD-10-CM | POA: Diagnosis not present

## 2020-02-29 DIAGNOSIS — Z79899 Other long term (current) drug therapy: Secondary | ICD-10-CM | POA: Diagnosis not present

## 2020-03-01 DIAGNOSIS — G7281 Critical illness myopathy: Secondary | ICD-10-CM | POA: Diagnosis not present

## 2020-03-01 DIAGNOSIS — Z79899 Other long term (current) drug therapy: Secondary | ICD-10-CM | POA: Diagnosis not present

## 2020-03-30 DIAGNOSIS — Z79899 Other long term (current) drug therapy: Secondary | ICD-10-CM | POA: Diagnosis not present

## 2020-03-30 DIAGNOSIS — G7281 Critical illness myopathy: Secondary | ICD-10-CM | POA: Diagnosis not present

## 2020-03-31 DIAGNOSIS — G7281 Critical illness myopathy: Secondary | ICD-10-CM | POA: Diagnosis not present

## 2020-03-31 DIAGNOSIS — Z79899 Other long term (current) drug therapy: Secondary | ICD-10-CM | POA: Diagnosis not present

## 2020-04-13 DIAGNOSIS — G7289 Other specified myopathies: Secondary | ICD-10-CM | POA: Diagnosis not present

## 2020-04-20 DIAGNOSIS — G7289 Other specified myopathies: Secondary | ICD-10-CM | POA: Diagnosis not present

## 2020-04-25 DIAGNOSIS — Z79899 Other long term (current) drug therapy: Secondary | ICD-10-CM | POA: Diagnosis not present

## 2020-04-25 DIAGNOSIS — G7281 Critical illness myopathy: Secondary | ICD-10-CM | POA: Diagnosis not present

## 2020-04-26 DIAGNOSIS — G7281 Critical illness myopathy: Secondary | ICD-10-CM | POA: Diagnosis not present

## 2020-04-26 DIAGNOSIS — Z79899 Other long term (current) drug therapy: Secondary | ICD-10-CM | POA: Diagnosis not present

## 2020-04-27 DIAGNOSIS — G7289 Other specified myopathies: Secondary | ICD-10-CM | POA: Diagnosis not present

## 2020-05-04 DIAGNOSIS — Z20822 Contact with and (suspected) exposure to covid-19: Secondary | ICD-10-CM | POA: Diagnosis not present

## 2020-05-04 DIAGNOSIS — G7289 Other specified myopathies: Secondary | ICD-10-CM | POA: Diagnosis not present

## 2020-05-23 DIAGNOSIS — G7281 Critical illness myopathy: Secondary | ICD-10-CM | POA: Diagnosis not present

## 2020-05-23 DIAGNOSIS — Z79899 Other long term (current) drug therapy: Secondary | ICD-10-CM | POA: Diagnosis not present

## 2020-05-24 DIAGNOSIS — Z79899 Other long term (current) drug therapy: Secondary | ICD-10-CM | POA: Diagnosis not present

## 2020-05-24 DIAGNOSIS — G7281 Critical illness myopathy: Secondary | ICD-10-CM | POA: Diagnosis not present

## 2020-06-21 DIAGNOSIS — Z79899 Other long term (current) drug therapy: Secondary | ICD-10-CM | POA: Diagnosis not present

## 2020-06-21 DIAGNOSIS — G7281 Critical illness myopathy: Secondary | ICD-10-CM | POA: Diagnosis not present

## 2020-06-22 DIAGNOSIS — G7281 Critical illness myopathy: Secondary | ICD-10-CM | POA: Diagnosis not present

## 2020-06-22 DIAGNOSIS — Z79899 Other long term (current) drug therapy: Secondary | ICD-10-CM | POA: Diagnosis not present

## 2020-06-27 DIAGNOSIS — G7289 Other specified myopathies: Secondary | ICD-10-CM | POA: Diagnosis not present

## 2020-06-27 DIAGNOSIS — Z7952 Long term (current) use of systemic steroids: Secondary | ICD-10-CM | POA: Diagnosis not present

## 2020-06-27 DIAGNOSIS — Z79899 Other long term (current) drug therapy: Secondary | ICD-10-CM | POA: Diagnosis not present

## 2020-07-12 DIAGNOSIS — D485 Neoplasm of uncertain behavior of skin: Secondary | ICD-10-CM | POA: Diagnosis not present

## 2020-07-12 DIAGNOSIS — C44329 Squamous cell carcinoma of skin of other parts of face: Secondary | ICD-10-CM | POA: Diagnosis not present

## 2020-07-12 DIAGNOSIS — B353 Tinea pedis: Secondary | ICD-10-CM | POA: Diagnosis not present

## 2020-07-12 DIAGNOSIS — C44319 Basal cell carcinoma of skin of other parts of face: Secondary | ICD-10-CM | POA: Diagnosis not present

## 2020-07-12 DIAGNOSIS — L570 Actinic keratosis: Secondary | ICD-10-CM | POA: Diagnosis not present

## 2020-07-19 DIAGNOSIS — M1712 Unilateral primary osteoarthritis, left knee: Secondary | ICD-10-CM | POA: Diagnosis not present

## 2020-07-20 DIAGNOSIS — G7281 Critical illness myopathy: Secondary | ICD-10-CM | POA: Diagnosis not present

## 2020-07-20 DIAGNOSIS — Z79899 Other long term (current) drug therapy: Secondary | ICD-10-CM | POA: Diagnosis not present

## 2020-07-21 DIAGNOSIS — G7281 Critical illness myopathy: Secondary | ICD-10-CM | POA: Diagnosis not present

## 2020-07-21 DIAGNOSIS — Z79899 Other long term (current) drug therapy: Secondary | ICD-10-CM | POA: Diagnosis not present

## 2020-08-03 DIAGNOSIS — C44329 Squamous cell carcinoma of skin of other parts of face: Secondary | ICD-10-CM | POA: Diagnosis not present

## 2020-08-07 DIAGNOSIS — M1712 Unilateral primary osteoarthritis, left knee: Secondary | ICD-10-CM | POA: Diagnosis not present

## 2020-08-10 ENCOUNTER — Ambulatory Visit: Payer: PPO | Attending: Internal Medicine

## 2020-08-10 DIAGNOSIS — Z23 Encounter for immunization: Secondary | ICD-10-CM

## 2020-08-10 NOTE — Progress Notes (Signed)
   Covid-19 Vaccination Clinic  Name:  Jacob Hunter    MRN: [id number] DOB: 1955/08/27  08/10/2020  [name] was observed post Covid-19 immunization for 15 minutes without incident. He was provided with Vaccine Information Sheet and instruction to access the V-Safe system.   [name] was instructed to call 911 with any severe reactions post vaccine: [name] Difficulty breathing  . Swelling of face and throat  . A fast heartbeat  . A bad rash all over body  . Dizziness and weakness   Immunizations Administered    Name Date Dose VIS Date Route   Pfizer COVID-19 Vaccine 08/10/2020  3:15 PM 0.3 mL 05/31/2020 Intramuscular   Manufacturer: [place]   Lot: [id number]   [place]: [id number]

## 2020-08-17 DIAGNOSIS — Z79899 Other long term (current) drug therapy: Secondary | ICD-10-CM | POA: Diagnosis not present

## 2020-08-17 DIAGNOSIS — C44319 Basal cell carcinoma of skin of other parts of face: Secondary | ICD-10-CM | POA: Diagnosis not present

## 2020-08-17 DIAGNOSIS — G7281 Critical illness myopathy: Secondary | ICD-10-CM | POA: Diagnosis not present

## 2020-08-17 DIAGNOSIS — C44329 Squamous cell carcinoma of skin of other parts of face: Secondary | ICD-10-CM | POA: Diagnosis not present

## 2020-08-18 DIAGNOSIS — Z79899 Other long term (current) drug therapy: Secondary | ICD-10-CM | POA: Diagnosis not present

## 2020-08-18 DIAGNOSIS — G7281 Critical illness myopathy: Secondary | ICD-10-CM | POA: Diagnosis not present

## 2020-09-13 DIAGNOSIS — Z79899 Other long term (current) drug therapy: Secondary | ICD-10-CM | POA: Diagnosis not present

## 2020-09-13 DIAGNOSIS — G7281 Critical illness myopathy: Secondary | ICD-10-CM | POA: Diagnosis not present

## 2020-09-14 DIAGNOSIS — G7281 Critical illness myopathy: Secondary | ICD-10-CM | POA: Diagnosis not present

## 2020-09-14 DIAGNOSIS — Z79899 Other long term (current) drug therapy: Secondary | ICD-10-CM | POA: Diagnosis not present

## 2020-09-19 DIAGNOSIS — E785 Hyperlipidemia, unspecified: Secondary | ICD-10-CM | POA: Diagnosis not present

## 2020-09-19 DIAGNOSIS — M1000 Idiopathic gout, unspecified site: Secondary | ICD-10-CM | POA: Diagnosis not present

## 2020-09-19 DIAGNOSIS — G7281 Critical illness myopathy: Secondary | ICD-10-CM | POA: Diagnosis not present

## 2020-09-19 DIAGNOSIS — Z125 Encounter for screening for malignant neoplasm of prostate: Secondary | ICD-10-CM | POA: Diagnosis not present

## 2020-09-19 DIAGNOSIS — F419 Anxiety disorder, unspecified: Secondary | ICD-10-CM | POA: Diagnosis not present

## 2020-09-19 DIAGNOSIS — M1990 Unspecified osteoarthritis, unspecified site: Secondary | ICD-10-CM | POA: Diagnosis not present

## 2020-09-22 NOTE — Patient Instructions (Addendum)
DUE TO COVID-19 ONLY ONE VISITOR IS ALLOWED TO COME WITH YOU AND STAY IN THE WAITING ROOM ONLY DURING PRE OP AND PROCEDURE DAY OF SURGERY. THE 1 VISITOR  MAY VISIT WITH YOU AFTER SURGERY IN YOUR PRIVATE ROOM DURING VISITING HOURS ONLY!  YOU NEED TO HAVE A COVID 19 TEST ON_    2-19-22______ @_______ , THIS TEST MUST BE DONE BEFORE SURGERY,  COVID TESTING SITE [address]  [id number], IT IS ON THE RIGHT GOING OUT [address] APPROXIMATELY  2 MINUTES PAST [employer] ON THE RIGHT. ONCE YOUR COVID TEST IS COMPLETED,  PLEASE BEGIN THE QUARANTINE INSTRUCTIONS AS OUTLINED IN YOUR HANDOUT.                BROWNIE NEHME  09/22/2020   Your procedure is scheduled on: 10-04-20   Report to [facility] Main  Entrance   Report to short stay  at      0530  AM     Call this number if you have problems the morning of surgery [phone]    Remember: Do not eat food or drink liquids :After Midnight.NO SOLID FOOD AFTER MIDNIGHT THE NIGHT PRIOR TO SURGERY.   NOTHING BY MOUTH EXCEPT CLEAR LIQUIDS UNTIL     0430 am.   PLEASE FINISH ENSURE DRINK PER SURGEON ORDER  WHICH NEEDS TO BE COMPLETED AT            0430 am    then nothing by mouth.     CLEAR LIQUID DIET   Foods Allowed                                                                                    Black Coffee and tea, regular and decaf                             Plain Jell-O any favor except red or purple                                           Fruit ices (not with fruit pulp)                                                         Iced Popsicles                                                       Carbonated beverages, regular and diet                                    Cranberry, grape and apple juices  Sports drinks like Gatorade Lightly seasoned clear broth or consume(fat free) Sugar, honey syrup   _____________________________________________________________________     BRUSH YOUR  TEETH MORNING OF SURGERY AND RINSE YOUR MOUTH OUT, NO CHEWING GUM CANDY OR MINTS.     Take these medicines the morning of surgery with A SIP OF WATER: tylenol if needed, prednisone                                 You may not have any metal on your body including hair pins and              piercings  Do not wear jewelry,  lotions, powders or perfumes, deodorant                     Men may shave face and neck.   Do not bring valuables to the hospital. [place].  Contacts, dentures or bridgework may not be worn into surgery.      Patients discharged the day of surgery will not be allowed to drive home. IF YOU ARE HAVING SURGERY AND GOING HOME THE SAME DAY, YOU MUST HAVE AN ADULT TO DRIVE YOU HOME AND BE WITH YOU FOR 24 HOURS. YOU MAY GO HOME BY TAXI OR UBER OR ORTHERWISE, BUT AN ADULT MUST ACCOMPANY YOU HOME AND STAY WITH YOU FOR 24 HOURS.  Name and phone number of your driver:  Special Instructions: N/A              Please read over the following fact sheets you were given: _____________________________________________________________________             [facility] - Preparing for Surgery Before surgery, you can play an important role.  Because skin is not sterile, your skin needs to be as free of germs as possible.  You can reduce the number of germs on your skin by washing with CHG (chlorahexidine gluconate) soap before surgery.  CHG is an antiseptic cleaner which kills germs and bonds with the skin to continue killing germs even after washing. Please DO NOT use if you have an allergy to CHG or antibacterial soaps.  If your skin becomes reddened/irritated stop using the CHG and inform your nurse when you arrive at Short Stay. Do not shave (including legs and underarms) for at least 48 hours prior to the first CHG shower.  You may shave your face/neck. Please follow these instructions carefully:  1.  Shower with CHG Soap the night  before surgery and the  morning of Surgery.  2.  If you choose to wash your hair, wash your hair first as usual with your  normal  shampoo.  3.  After you shampoo, rinse your hair and body thoroughly to remove the  shampoo.                           4.  Use CHG as you would any other liquid soap.  You can apply chg directly  to the skin and wash                       Gently with a scrungie or clean washcloth.  5.  Apply the CHG Soap to your body ONLY FROM THE NECK DOWN.  Do not use on face/ open                           Wound or open sores. Avoid contact with eyes, ears mouth and genitals (private parts).                       Wash face,  Genitals (private parts) with your normal soap.             6.  Wash thoroughly, paying special attention to the area where your surgery  will be performed.  7.  Thoroughly rinse your body with warm water from the neck down.  8.  DO NOT shower/wash with your normal soap after using and rinsing off  the CHG Soap.                9.  Pat yourself dry with a clean towel.            10.  Wear clean pajamas.            11.  Place clean sheets on your bed the night of your first shower and do not  sleep with pets. Day of Surgery : Do not apply any lotions/deodorants the morning of surgery.  Please wear clean clothes to the hospital/surgery center.  FAILURE TO FOLLOW THESE INSTRUCTIONS MAY RESULT IN THE CANCELLATION OF YOUR SURGERY PATIENT SIGNATURE_________________________________  NURSE SIGNATURE__________________________________  ________________________________________________________________________   Adam Phenix  An incentive spirometer is a tool that can help keep your lungs clear and active. This tool measures how well you are filling your lungs with each breath. Taking long deep breaths may help reverse or decrease the chance of developing breathing (pulmonary) problems (especially infection) following:  A long period of time when you are  unable to move or be active. BEFORE THE PROCEDURE   If the spirometer includes an indicator to show your best effort, your nurse or respiratory therapist will set it to a desired goal.  If possible, sit up straight or lean slightly forward. Try not to slouch.  Hold the incentive spirometer in an upright position. INSTRUCTIONS FOR USE  1. Sit on the edge of your bed if possible, or sit up as far as you can in bed or on a chair. 2. Hold the incentive spirometer in an upright position. 3. Breathe out normally. 4. Place the mouthpiece in your mouth and seal your lips tightly around it. 5. Breathe in slowly and as deeply as possible, raising the piston or the ball toward the top of the column. 6. Hold your breath for 3-5 seconds or for as long as possible. Allow the piston or ball to fall to the bottom of the column. 7. Remove the mouthpiece from your mouth and breathe out normally. 8. Rest for a few seconds and repeat Steps 1 through 7 at least 10 times every 1-2 hours when you are awake. Take your time and take a few normal breaths between deep breaths. 9. The spirometer may include an indicator to show your best effort. Use the indicator as a goal to work toward during each repetition. 10. After each set of 10 deep breaths, practice coughing to be sure your lungs are clear. If you have an incision (the cut made at the time of surgery), support your incision when coughing by placing a pillow or rolled up towels firmly against it. Once you are able to get out of  bed, walk around indoors and cough well. You may stop using the incentive spirometer when instructed by your caregiver.  RISKS AND COMPLICATIONS  Take your time so you do not get dizzy or light-headed.  If you are in pain, you may need to take or ask for pain medication before doing incentive spirometry. It is harder to take a deep breath if you are having pain. AFTER USE  Rest and breathe slowly and easily.  It can be helpful to  keep track of a log of your progress. Your caregiver can provide you with a simple table to help with this. If you are using the spirometer at home, follow these instructions: [place] IF:   You are having difficultly using the spirometer.  You have trouble using the spirometer as often as instructed.  Your pain medication is not giving enough relief while using the spirometer.  You develop fever of 100.5 F (38.1 C) or higher. SEEK IMMEDIATE MEDICAL CARE IF:   You cough up bloody sputum that had not been present before.  You develop fever of 102 F (38.9 C) or greater.  You develop worsening pain at or near the incision site. MAKE SURE YOU:   Understand these instructions.  Will watch your condition.  Will get help right away if you are not doing well or get worse. Document Released: 12/09/2006 Document Revised: 10/21/2011 Document Reviewed: 02/09/2007 [facility] Patient Information 2014 [place], [place].   ________________________________________________________________________

## 2020-09-22 NOTE — Progress Notes (Signed)
Please place orders in epic pt. Is scheduled for a preop. Thank you.

## 2020-09-22 NOTE — Progress Notes (Addendum)
PCP - Asencion Noble, MD Cardiologist - no Neuro- Zettie Cooley, MD  PPM/ICD -  Device Orders -  Rep Notified -   Chest x-ray -  EKG -  Stress Test -  ECHO -  Cardiac Cath -   Sleep Study -  CPAP -   Fasting Blood Sugar -  Checks Blood Sugar _____ times a day  Blood Thinner Instructions: Aspirin Instructions:  ERAS Protcol - PRE-SURGERY Ensure   COVID TEST- 2-19  Activity--Able to walk to mailbox without SOB   Anesthesia review: Necrotizing myopathy  Patient denies shortness of breath, fever, cough and chest pain at PAT appointment  NONE   All instructions explained to the patient, with a verbal understanding of the material. Patient agrees to go over the instructions while at home for a better understanding. Patient also instructed to self quarantine after being tested for COVID-19. The opportunity to ask questions was provided.

## 2020-09-25 NOTE — H&P (Signed)
TOTAL KNEE ADMISSION H&P  Patient is being admitted for left total knee arthroplasty.  Subjective:  Chief Complaint:left knee pain.  HPI: Jacob Hunter, 65 y.o. male, has a history of pain and functional disability in the left knee due to arthritis and has failed non-surgical conservative treatments for greater than 12 weeks to includeNSAID's and/or analgesics, corticosteriod injections, viscosupplementation injections, flexibility and strengthening excercises and activity modification.  Onset of symptoms was gradual, starting 3 years ago with gradually worsening course since that time. The patient noted prior procedures on the knee to include  arthroscopy and menisectomy on the left knee(s).  Patient currently rates pain in the left knee(s) at 7 out of 10 with activity. Patient has night pain, worsening of pain with activity and weight bearing and pain that interferes with activities of daily living.  Patient has evidence of subchondral sclerosis, periarticular osteophytes and joint space narrowing by imaging studies. This patient has had no previous injury. There is no active infection.  Patient Active Problem List   Diagnosis Date Noted  . History of IBS 01/21/2020  . Chronic diarrhea 10/25/2019  . Back pain 07/26/2019  . Spinal stenosis 07/26/2019  . Polymyositis ([place]) 07/26/2019  . Pain in left knee 07/21/2018  . Anxiety and depression 12/30/2015  . Impaired mobility and activities of daily living 12/30/2015  . 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 Bilateral leg weakness 01/24/2015  . Elevated CPK 01/24/2015  . Transaminitis 10/24/2014   Past Medical History:  Diagnosis Date  . Arthritis   . Knee pain   . Necrotizing myopathy   . Polymyositis [facility])     Past Surgical History:  Procedure Laterality Date  . ANKLE SURGERY Left   . COLONOSCOPY N/A 10/05/2019   [place]: 3 colonic polyps removed, 2 were tubular adenomas, cecal polyp was sessile serrated polyp.  Advised 3-year  surveillance colonoscopy.  [name] KNEE SURGERY Left   . left wrist surgery    . POLYPECTOMY  10/05/2019   Procedure: POLYPECTOMY;  Surgeon: Daneil Dolin, MD;  Location: [facility] SUITE;  Service: Endoscopy;;    No current facility-administered medications for this encounter.   Current Outpatient Medications  Medication Sig Dispense Refill Last Dose  . acetaminophen (TYLENOL) 500 MG tablet Take 1,000 mg by mouth every 6 (six) hours as needed for moderate pain.     [name] ascorbic acid (VITAMIN C) 1000 MG tablet Take 1,000 mg by mouth daily.     . calcium carbonate (TUMS - DOSED IN MG ELEMENTAL CALCIUM) 500 MG chewable tablet Chew 1 tablet by mouth daily as needed for heartburn.     . calcium-vitamin D (OSCAL WITH D) 500-200 MG-UNIT tablet Take 1 tablet by mouth daily with breakfast. 30 tablet 2   . cholecalciferol (VITAMIN D3) 25 MCG (1000 UNIT) tablet Take 1,000 Units by mouth daily.     . Coenzyme Q10 (CO Q 10) 100 MG CAPS Take 100 mg by mouth daily.     . folic acid (FOLVITE) 1 MG tablet Take 1 tablet (1 mg total) by mouth daily. 30 tablet 0   . methotrexate (RHEUMATREX) 2.5 MG tablet Take 25 mg by mouth every Monday.      . predniSONE (DELTASONE) 10 MG tablet Take 10 mg by mouth daily with breakfast.     . PRESCRIPTION MEDICATION IVIG Antibodies 2 visits every 4 weeks     . riTUXimab (RITUXAN) 100 MG/10ML injection Inject 1,000 mg into the vein See admin instructions. 2 times a month in  March and September     . vitamin B-12 (CYANOCOBALAMIN) 1000 MCG tablet Take 1,000 mcg by mouth daily.      Allergies  Allergen Reactions  . Methylprednisolone Anaphylaxis, Shortness Of Breath and Rash  . Demerol [Meperidine] Hives  . 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 Smoking status: Never Smoker  . Smokeless tobacco: Never Used  Substance Use Topics  . Alcohol use: No    Alcohol/week: 0.0  standard drinks    Comment: Last ETOH was 1-2 drinks in 1990.    Family History  Problem Relation Age of Onset  . Hypertension Father   . Hypercholesterolemia Father   . Hypercholesterolemia Sister   . Hypertension Mother   . Breast cancer Sister   . Colon cancer Neg Hx   . Liver disease Neg Hx      Review of Systems  Constitutional: Negative.   HENT: Negative.   Eyes: Negative.   Respiratory: Negative.   Cardiovascular: Negative.   Gastrointestinal: Negative.   Endocrine: Negative.   Genitourinary: Negative.   Musculoskeletal: Positive for arthralgias and joint swelling.  Allergic/Immunologic: Negative.   Neurological: Negative.   Hematological: Negative.   Psychiatric/Behavioral: Negative.   All other systems reviewed and are negative.   Objective:  Physical Exam Vitals reviewed.  Constitutional:      Appearance: Normal appearance. He is normal weight.  HENT:     Head: Normocephalic and atraumatic.  Eyes:     Extraocular Movements: Extraocular movements intact.  Neck:     Vascular: No carotid bruit.  Cardiovascular:     Rate and Rhythm: Normal rate.     Pulses: Normal pulses.     Heart sounds: Normal heart sounds. No murmur heard. No friction rub. No gallop.   Pulmonary:     Effort: Pulmonary effort is normal.     Breath sounds: Normal breath sounds.  Musculoskeletal:     Cervical back: Normal range of motion and neck supple. No tenderness.     Comments: Tenderness over medial joint line Crepitus with ROM  Full ROM of left knee NVI in left lower extremity  Lymphadenopathy:     Cervical: No cervical adenopathy.  Skin:    General: Skin is warm and dry.     Capillary Refill: Capillary refill takes less than 2 seconds.  Neurological:     General: No focal deficit present.     Mental Status: He is alert and oriented to person, place, and time.  Psychiatric:        Mood and Affect: Mood normal.        Behavior: Behavior normal.        Thought Content:  Thought content normal.        Judgment: Judgment normal.     Vital signs in last 24 hours: BP: ()/()  Arterial Line BP: ()/()   Labs:   Estimated body mass index is 29.16 kg/m as calculated from the following:   Height as of 01/21/20: 6\' 1"  (1.854 m).   Weight as of 01/21/20: 100.2 kg.   Imaging Review Plain radiographs demonstrate severe degenerative joint disease of the left knee(s). The overall alignment issignificant varus. The bone quality appears to be good for age and reported activity level.      Assessment/Plan:  End stage arthritis, left knee   The patient history, physical examination, clinical judgment of the provider and  imaging studies are consistent with end stage degenerative joint disease of the left knee(s) and total knee arthroplasty is deemed medically necessary. The treatment options including medical management, injection therapy arthroscopy and arthroplasty were discussed at length. The risks and benefits of total knee arthroplasty were presented and reviewed. The risks due to aseptic loosening, infection, stiffness, patella tracking problems, thromboembolic complications and other imponderables were discussed. The patient acknowledged the explanation, agreed to proceed with the plan and consent was signed. Patient is being admitted for inpatient treatment for surgery, pain control, PT, OT, prophylactic antibiotics, VTE prophylaxis, progressive ambulation and ADL's and discharge planning. The patient is planning to be discharged home with outpatient PT.     Patient's anticipated LOS is less than 2 midnights, meeting these requirements: - Younger than 32 - Lives within 1 hour of care - Has a competent adult at home to recover with post-op recover - NO history of  - Chronic pain requiring opiods  - Diabetes  - Coronary Artery Disease  - Heart failure  - Heart attack  - Stroke  - DVT/VTE  - Cardiac arrhythmia  - Respiratory Failure/COPD  - Renal  failure  - Anemia  - Advanced Liver disease

## 2020-09-27 ENCOUNTER — Encounter (HOSPITAL_COMMUNITY): Payer: Self-pay

## 2020-09-27 ENCOUNTER — Encounter (HOSPITAL_COMMUNITY)
Admission: RE | Admit: 2020-09-27 | Discharge: 2020-09-27 | Disposition: A | Discharge status: Home / Self Care | Payer: PPO | Source: Ambulatory Visit | Attending: Specialist | Admitting: Specialist

## 2020-09-27 ENCOUNTER — Other Ambulatory Visit: Payer: Self-pay

## 2020-09-27 DIAGNOSIS — Z01812 Encounter for preprocedural laboratory examination: Secondary | ICD-10-CM | POA: Diagnosis not present

## 2020-09-27 HISTORY — DX: Pneumonia, unspecified organism: J18.9

## 2020-09-27 LAB — CBC
HCT: 38.8 % — ABNORMAL LOW (ref 39.0–52.0)
Hemoglobin: 12.6 g/dL — ABNORMAL LOW (ref 13.0–17.0)
MCH: 31.2 pg (ref 26.0–34.0)
MCHC: 32.5 g/dL (ref 30.0–36.0)
MCV: 96 fL (ref 80.0–100.0)
Platelets: 180 10*3/uL (ref 150–400)
RBC: 4.04 MIL/uL — ABNORMAL LOW (ref 4.22–5.81)
RDW: 16 % — ABNORMAL HIGH (ref 11.5–15.5)
WBC: 7.5 10*3/uL (ref 4.0–10.5)
nRBC: 0 % (ref 0.0–0.2)

## 2020-09-27 LAB — APTT: aPTT: 26 seconds (ref 24–36)

## 2020-09-27 LAB — COMPREHENSIVE METABOLIC PANEL
ALT: 34 U/L (ref 0–44)
AST: 36 U/L (ref 15–41)
Albumin: 3.7 g/dL (ref 3.5–5.0)
Alkaline Phosphatase: 57 U/L (ref 38–126)
Anion gap: 9 (ref 5–15)
BUN: 19 mg/dL (ref 8–23)
CO2: 28 mmol/L (ref 22–32)
Calcium: 9.6 mg/dL (ref 8.9–10.3)
Chloride: 104 mmol/L (ref 98–111)
Creatinine, Ser: 0.97 mg/dL (ref 0.61–1.24)
GFR, Estimated: >60 mL/min (ref >60)
Glucose, Bld: 90 mg/dL (ref 70–99)
Potassium: 4.6 mmol/L (ref 3.5–5.1)
Sodium: 141 mmol/L (ref 135–145)
Total Bilirubin: 0.9 mg/dL (ref 0.3–1.2)
Total Protein: 7.8 g/dL (ref 6.5–8.1)

## 2020-09-27 LAB — SURGICAL PCR SCREEN
MRSA, PCR: NEGATIVE
Staphylococcus aureus: NEGATIVE

## 2020-09-27 LAB — PROTIME-INR
INR: 1.1 (ref 0.8–1.2)
Prothrombin Time: 13.7 seconds (ref 11.4–15.2)

## 2020-09-30 ENCOUNTER — Other Ambulatory Visit (HOSPITAL_COMMUNITY)
Admission: RE | Admit: 2020-09-30 | Discharge: 2020-09-30 | Disposition: A | Discharge status: Home / Self Care | Payer: PPO | Source: Ambulatory Visit | Attending: Specialist | Admitting: Specialist

## 2020-09-30 DIAGNOSIS — Z01812 Encounter for preprocedural laboratory examination: Secondary | ICD-10-CM | POA: Insufficient documentation

## 2020-09-30 DIAGNOSIS — Z20822 Contact with and (suspected) exposure to covid-19: Secondary | ICD-10-CM | POA: Insufficient documentation

## 2020-09-30 LAB — SARS CORONAVIRUS 2 (TAT 6-24 HRS): SARS Coronavirus 2: NEGATIVE

## 2020-10-03 MED ORDER — BUPIVACAINE LIPOSOME 1.3 % IJ SUSP
20.0000 mL | Freq: Once | INTRAMUSCULAR | Status: AC
  Filled 2020-10-03: qty 20

## 2020-10-03 NOTE — Anesthesia Preprocedure Evaluation (Addendum)
Anesthesia Evaluation  Patient identified by MRN, date of birth, ID band Patient awake    Reviewed: Allergy & Precautions, H&P , NPO status , Patient's Chart, lab work & pertinent test results  Airway Mallampati: II  TM Distance: >3 FB Neck ROM: Full    Dental no notable dental hx.    Pulmonary neg pulmonary ROS,    Pulmonary exam normal breath sounds clear to auscultation       Cardiovascular negative cardio ROS Normal cardiovascular exam Rhythm:Regular Rate:Normal     Neuro/Psych  Neuromuscular disease (polymyositis; necrotizing myopathy) negative psych ROS   GI/Hepatic negative GI ROS, Neg liver ROS,   Endo/Other  negative endocrine ROS  Renal/GU negative Renal ROS  negative genitourinary   Musculoskeletal  (+) Arthritis , Osteoarthritis,    Abdominal   Peds negative pediatric ROS (+)  Hematology negative hematology ROS (+)   Anesthesia Other Findings   Reproductive/Obstetrics negative OB ROS                            Anesthesia Physical Anesthesia Plan  ASA: III  Anesthesia Plan: MAC, Regional and Spinal   Post-op Pain Management:    Induction: Intravenous  PONV Risk Score and Plan: Propofol infusion, TIVA and Treatment may vary due to age or medical condition  Airway Management Planned: Natural Airway and Simple Face Mask  Additional Equipment: None  Intra-op Plan:   Post-operative Plan:   Informed Consent: I have reviewed the patients History and Physical, chart, labs and discussed the procedure including the risks, benefits and alternatives for the proposed anesthesia with the patient or authorized representative who has indicated his/her understanding and acceptance.       Plan Discussed with: CRNA and Anesthesiologist  Anesthesia Plan Comments: (Adductor canal block for postop pain control. Spinal. GA/LMA as backup plan)       Anesthesia Quick  Evaluation

## 2020-10-04 ENCOUNTER — Ambulatory Visit (HOSPITAL_COMMUNITY): Payer: PPO | Admitting: Anesthesiology

## 2020-10-04 ENCOUNTER — Observation Stay (HOSPITAL_COMMUNITY)
Admission: RE | Admit: 2020-10-04 | Discharge: 2020-10-05 | Disposition: A | Discharge status: Home / Self Care | Payer: PPO | Attending: Specialist | Admitting: Specialist

## 2020-10-04 ENCOUNTER — Encounter (HOSPITAL_COMMUNITY)
Admission: RE | Disposition: A | Discharge status: Home / Self Care | Payer: Self-pay | Source: Home / Self Care | Attending: Specialist

## 2020-10-04 ENCOUNTER — Other Ambulatory Visit: Payer: Self-pay

## 2020-10-04 ENCOUNTER — Encounter (HOSPITAL_COMMUNITY): Payer: Self-pay | Admitting: Specialist

## 2020-10-04 DIAGNOSIS — Z79899 Other long term (current) drug therapy: Secondary | ICD-10-CM | POA: Diagnosis not present

## 2020-10-04 DIAGNOSIS — J189 Pneumonia, unspecified organism: Secondary | ICD-10-CM | POA: Diagnosis not present

## 2020-10-04 DIAGNOSIS — F418 Other specified anxiety disorders: Secondary | ICD-10-CM | POA: Diagnosis not present

## 2020-10-04 DIAGNOSIS — M1712 Unilateral primary osteoarthritis, left knee: Principal | ICD-10-CM | POA: Diagnosis present

## 2020-10-04 DIAGNOSIS — G8918 Other acute postprocedural pain: Secondary | ICD-10-CM | POA: Diagnosis not present

## 2020-10-04 HISTORY — PX: TOTAL KNEE ARTHROPLASTY: SHX125

## 2020-10-04 LAB — CBC
HCT: 34.7 % — ABNORMAL LOW (ref 39.0–52.0)
Hemoglobin: 11 g/dL — ABNORMAL LOW (ref 13.0–17.0)
MCH: 31.1 pg (ref 26.0–34.0)
MCHC: 31.7 g/dL (ref 30.0–36.0)
MCV: 98 fL (ref 80.0–100.0)
Platelets: 157 10*3/uL (ref 150–400)
RBC: 3.54 MIL/uL — ABNORMAL LOW (ref 4.22–5.81)
RDW: 15.9 % — ABNORMAL HIGH (ref 11.5–15.5)
WBC: 6 10*3/uL (ref 4.0–10.5)
nRBC: 0 % (ref 0.0–0.2)

## 2020-10-04 LAB — TYPE AND SCREEN
ABO/RH(D): A POS
Antibody Screen: NEGATIVE

## 2020-10-04 LAB — CREATININE, SERUM
Creatinine, Ser: 0.92 mg/dL (ref 0.61–1.24)
GFR, Estimated: >60 mL/min (ref >60)

## 2020-10-04 LAB — HIV ANTIBODY (ROUTINE TESTING W REFLEX): HIV Screen 4th Generation wRfx: NONREACTIVE

## 2020-10-04 LAB — ABO/RH: ABO/RH(D): A POS

## 2020-10-04 SURGERY — ARTHROPLASTY, KNEE, TOTAL
Anesthesia: Monitor Anesthesia Care | Site: Knee | Laterality: Left

## 2020-10-04 MED ORDER — SODIUM CHLORIDE (PF) 0.9 % IJ SOLN
INTRAMUSCULAR | Status: AC
  Filled 2020-10-04: qty 10

## 2020-10-04 MED ORDER — DIPHENHYDRAMINE HCL 50 MG/ML IJ SOLN: 12.5000 mg | Freq: Once | INTRAMUSCULAR | Status: DC | PRN

## 2020-10-04 MED ORDER — FENTANYL CITRATE (PF) 100 MCG/2ML IJ SOLN: 25.0000 ug | INTRAMUSCULAR | Status: DC | PRN

## 2020-10-04 MED ORDER — GABAPENTIN 300 MG PO CAPS
300.0000 mg | ORAL_CAPSULE | Freq: Three times a day (TID) | ORAL | Status: DC
  Administered 2020-10-04 – 2020-10-05 (×4): 300 mg via ORAL
  Filled 2020-10-04 (×4): qty 1

## 2020-10-04 MED ORDER — HYDROMORPHONE HCL 1 MG/ML IJ SOLN
0.5000 mg | INTRAMUSCULAR | Status: DC | PRN
  Administered 2020-10-04 (×2): 1 mg via INTRAVENOUS
  Filled 2020-10-04: qty 1

## 2020-10-04 MED ORDER — EPHEDRINE SULFATE-NACL 50-0.9 MG/10ML-% IV SOSY
PREFILLED_SYRINGE | INTRAVENOUS | Status: DC | PRN
  Administered 2020-10-04: 10 mg via INTRAVENOUS
  Administered 2020-10-04 (×2): 5 mg via INTRAVENOUS
  Administered 2020-10-04: 10 mg via INTRAVENOUS
  Administered 2020-10-04: 5 mg via INTRAVENOUS
  Administered 2020-10-04: 10 mg via INTRAVENOUS

## 2020-10-04 MED ORDER — ONDANSETRON HCL 4 MG/2ML IJ SOLN
INTRAMUSCULAR | Status: DC | PRN
Start: 2020-10-04 — End: 2020-10-04
  Administered 2020-10-04: 4 mg via INTRAVENOUS

## 2020-10-04 MED ORDER — SENNOSIDES-DOCUSATE SODIUM 8.6-50 MG PO TABS: 1.0000 | ORAL_TABLET | Freq: Every evening | ORAL | Status: DC | PRN

## 2020-10-04 MED ORDER — ONDANSETRON HCL 4 MG PO TABS: 4.0000 mg | ORAL_TABLET | Freq: Four times a day (QID) | ORAL | Status: DC | PRN

## 2020-10-04 MED ORDER — IRRISEPT - 450ML BOTTLE WITH 0.05% CHG IN STERILE WATER, USP 99.95% OPTIME
TOPICAL | Status: DC | PRN
  Administered 2020-10-04: 450 mL via TOPICAL

## 2020-10-04 MED ORDER — ACETAMINOPHEN 500 MG PO TABS
1000.0000 mg | ORAL_TABLET | Freq: Four times a day (QID) | ORAL | Status: AC
  Administered 2020-10-04 – 2020-10-05 (×4): 1000 mg via ORAL
  Filled 2020-10-04 (×4): qty 2

## 2020-10-04 MED ORDER — LIDOCAINE HCL (PF) 2 % IJ SOLN
INTRAMUSCULAR | Status: AC
  Filled 2020-10-04: qty 5

## 2020-10-04 MED ORDER — HYDROMORPHONE HCL 1 MG/ML IJ SOLN
INTRAMUSCULAR | Status: AC
  Filled 2020-10-04: qty 1

## 2020-10-04 MED ORDER — CEFAZOLIN SODIUM-DEXTROSE 1-4 GM/50ML-% IV SOLN
1.0000 g | Freq: Four times a day (QID) | INTRAVENOUS | Status: AC
  Administered 2020-10-04 – 2020-10-05 (×3): 1 g via INTRAVENOUS
  Filled 2020-10-04 (×4): qty 50

## 2020-10-04 MED ORDER — ONDANSETRON HCL 4 MG/2ML IJ SOLN: 4.0000 mg | Freq: Four times a day (QID) | INTRAMUSCULAR | Status: DC | PRN

## 2020-10-04 MED ORDER — SODIUM CHLORIDE 0.9 % IV SOLN: INTRAVENOUS | Status: DC

## 2020-10-04 MED ORDER — ACETAMINOPHEN 500 MG PO TABS: 1000.0000 mg | ORAL_TABLET | Freq: Once | ORAL | Status: AC

## 2020-10-04 MED ORDER — CEFAZOLIN SODIUM-DEXTROSE 2-4 GM/100ML-% IV SOLN: 2.0000 g | INTRAVENOUS | Status: AC

## 2020-10-04 MED ORDER — AMISULPRIDE (ANTIEMETIC) 5 MG/2ML IV SOLN: 10.0000 mg | Freq: Once | INTRAVENOUS | Status: DC | PRN

## 2020-10-04 MED ORDER — DEXAMETHASONE SODIUM PHOSPHATE 10 MG/ML IJ SOLN
8.0000 mg | Freq: Once | INTRAMUSCULAR | Status: AC
  Administered 2020-10-04: 5 mg via INTRAVENOUS

## 2020-10-04 MED ORDER — CEFAZOLIN SODIUM-DEXTROSE 2-4 GM/100ML-% IV SOLN
INTRAVENOUS | Status: AC
  Filled 2020-10-04: qty 100

## 2020-10-04 MED ORDER — ONDANSETRON HCL 4 MG/2ML IJ SOLN
INTRAMUSCULAR | Status: AC
  Filled 2020-10-04: qty 2

## 2020-10-04 MED ORDER — BUPIVACAINE HCL (PF) 0.5 % IJ SOLN
INTRAMUSCULAR | Status: DC | PRN
  Administered 2020-10-04: 20 mL via PERINEURAL

## 2020-10-04 MED ORDER — METHOCARBAMOL 500 MG IVPB - SIMPLE MED
500.0000 mg | Freq: Four times a day (QID) | INTRAVENOUS | Status: DC | PRN
  Filled 2020-10-04: qty 50

## 2020-10-04 MED ORDER — ENOXAPARIN SODIUM 40 MG/0.4ML ~~LOC~~ SOLN
40.0000 mg | SUBCUTANEOUS | Status: DC
  Administered 2020-10-05: 40 mg via SUBCUTANEOUS
  Filled 2020-10-04: qty 0.4

## 2020-10-04 MED ORDER — 0.9 % SODIUM CHLORIDE (POUR BTL) OPTIME
TOPICAL | Status: DC | PRN
  Administered 2020-10-04: 1000 mL

## 2020-10-04 MED ORDER — MIDAZOLAM HCL 5 MG/5ML IJ SOLN
INTRAMUSCULAR | Status: DC | PRN
  Administered 2020-10-04 (×2): 1 mg via INTRAVENOUS

## 2020-10-04 MED ORDER — BISACODYL 5 MG PO TBEC: 5.0000 mg | DELAYED_RELEASE_TABLET | Freq: Every day | ORAL | Status: DC | PRN

## 2020-10-04 MED ORDER — POVIDONE-IODINE 10 % EX SWAB
2.0000 "application " | Freq: Once | CUTANEOUS | Status: AC
  Administered 2020-10-04: 2 via TOPICAL

## 2020-10-04 MED ORDER — OXYCODONE HCL 5 MG PO TABS
5.0000 mg | ORAL_TABLET | ORAL | Status: DC | PRN
  Administered 2020-10-04 – 2020-10-05 (×3): 10 mg via ORAL

## 2020-10-04 MED ORDER — BUPIVACAINE IN DEXTROSE 0.75-8.25 % IT SOLN
INTRATHECAL | Status: DC | PRN
  Administered 2020-10-04: 1.8 mL via INTRATHECAL

## 2020-10-04 MED ORDER — DEXAMETHASONE SODIUM PHOSPHATE 10 MG/ML IJ SOLN
INTRAMUSCULAR | Status: AC
  Filled 2020-10-04: qty 1

## 2020-10-04 MED ORDER — TRANEXAMIC ACID-NACL 1000-0.7 MG/100ML-% IV SOLN
INTRAVENOUS | Status: AC
  Filled 2020-10-04: qty 100

## 2020-10-04 MED ORDER — LACTATED RINGERS IV SOLN: INTRAVENOUS | Status: DC

## 2020-10-04 MED ORDER — OXYCODONE HCL 5 MG PO TABS
ORAL_TABLET | ORAL | Status: AC
  Filled 2020-10-04: qty 1

## 2020-10-04 MED ORDER — PROMETHAZINE HCL 25 MG/ML IJ SOLN: 6.2500 mg | INTRAMUSCULAR | Status: DC | PRN

## 2020-10-04 MED ORDER — MIDAZOLAM HCL 2 MG/2ML IJ SOLN
INTRAMUSCULAR | Status: AC
  Filled 2020-10-04: qty 2

## 2020-10-04 MED ORDER — FENTANYL CITRATE (PF) 100 MCG/2ML IJ SOLN
INTRAMUSCULAR | Status: DC | PRN
  Administered 2020-10-04 (×2): 50 ug via INTRAVENOUS

## 2020-10-04 MED ORDER — OXYCODONE HCL 5 MG/5ML PO SOLN: 5.0000 mg | Freq: Once | ORAL | Status: AC | PRN

## 2020-10-04 MED ORDER — TRANEXAMIC ACID-NACL 1000-0.7 MG/100ML-% IV SOLN: 1000.0000 mg | INTRAVENOUS | Status: AC

## 2020-10-04 MED ORDER — METHOCARBAMOL 500 MG PO TABS
500.0000 mg | ORAL_TABLET | Freq: Four times a day (QID) | ORAL | Status: DC | PRN
  Administered 2020-10-04 – 2020-10-05 (×3): 500 mg via ORAL
  Filled 2020-10-04 (×3): qty 1

## 2020-10-04 MED ORDER — ORAL CARE MOUTH RINSE
15.0000 mL | Freq: Once | OROMUCOSAL | Status: AC
  Administered 2020-10-04: 15 mL via OROMUCOSAL

## 2020-10-04 MED ORDER — CEFAZOLIN SODIUM-DEXTROSE 2-3 GM-%(50ML) IV SOLR
INTRAVENOUS | Status: DC | PRN
  Administered 2020-10-04: 2 g via INTRAVENOUS

## 2020-10-04 MED ORDER — DIPHENHYDRAMINE HCL 12.5 MG/5ML PO ELIX: 12.5000 mg | ORAL_SOLUTION | ORAL | Status: DC | PRN

## 2020-10-04 MED ORDER — SODIUM CHLORIDE 0.9 % IV SOLN
INTRAVENOUS | Status: DC | PRN
  Administered 2020-10-04: 30 mL

## 2020-10-04 MED ORDER — MAGNESIUM CITRATE PO SOLN: 1.0000 | Freq: Once | ORAL | Status: DC | PRN

## 2020-10-04 MED ORDER — PROPOFOL 1000 MG/100ML IV EMUL
INTRAVENOUS | Status: AC
  Filled 2020-10-04: qty 100

## 2020-10-04 MED ORDER — OXYCODONE HCL 5 MG PO TABS
5.0000 mg | ORAL_TABLET | Freq: Once | ORAL | Status: AC | PRN
  Administered 2020-10-04: 5 mg via ORAL

## 2020-10-04 MED ORDER — SODIUM CHLORIDE (PF) 0.9 % IJ SOLN
INTRAMUSCULAR | Status: DC | PRN
  Administered 2020-10-04: 60 mL

## 2020-10-04 MED ORDER — ACETAMINOPHEN 500 MG PO TABS
ORAL_TABLET | ORAL | Status: AC
  Administered 2020-10-04: 1000 mg via ORAL
  Filled 2020-10-04: qty 2

## 2020-10-04 MED ORDER — CHLORHEXIDINE GLUCONATE 0.12 % MT SOLN: 15.0000 mL | Freq: Once | OROMUCOSAL | Status: AC

## 2020-10-04 MED ORDER — FENTANYL CITRATE (PF) 100 MCG/2ML IJ SOLN
INTRAMUSCULAR | Status: AC
  Filled 2020-10-04: qty 2

## 2020-10-04 MED ORDER — TRAMADOL HCL 50 MG PO TABS
50.0000 mg | ORAL_TABLET | Freq: Four times a day (QID) | ORAL | Status: DC
Start: 2020-10-04 — End: 2020-10-05
  Administered 2020-10-04 – 2020-10-05 (×5): 50 mg via ORAL
  Filled 2020-10-04 (×5): qty 1

## 2020-10-04 MED ORDER — SODIUM CHLORIDE 0.9 % IR SOLN
Status: DC | PRN
  Administered 2020-10-04: 1000 mL

## 2020-10-04 MED ORDER — PROPOFOL 500 MG/50ML IV EMUL
INTRAVENOUS | Status: DC | PRN
  Administered 2020-10-04: 50 ug/kg/min via INTRAVENOUS

## 2020-10-04 MED ORDER — OXYCODONE HCL 5 MG PO TABS
10.0000 mg | ORAL_TABLET | ORAL | Status: DC | PRN
  Administered 2020-10-05: 15 mg via ORAL
  Filled 2020-10-04: qty 3
  Filled 2020-10-04 (×3): qty 2

## 2020-10-04 MED ORDER — ACETAMINOPHEN 325 MG PO TABS: 325.0000 mg | ORAL_TABLET | Freq: Four times a day (QID) | ORAL | Status: DC | PRN

## 2020-10-04 SURGICAL SUPPLY — 67 items
ADH SKN CLS APL DERMABOND .7 (GAUZE/BANDAGES/DRESSINGS) ×1
ATTUNE MED DOME PAT 41 KNEE (Knees) ×2 IMPLANT
ATTUNE PS FEM LT CEM SZ9 KNEE (Femur) ×2 IMPLANT
ATTUNE PS RP INSR SZ9 8 KNEE (Femur) ×2 IMPLANT
BAG SPEC THK2 15X12 ZIP CLS (MISCELLANEOUS) ×1
BAG ZIPLOCK 12X15 (MISCELLANEOUS) ×2 IMPLANT
BASE TIBIAL ATTUNE KNEE SZ9 (Knees) ×1 IMPLANT
BLADE SAG 18X100X1.27 (BLADE) ×2 IMPLANT
BLADE SAW SGTL 11.0X1.19X90.0M (BLADE) ×2 IMPLANT
BNDG ELASTIC 4X5.8 VLCR STR LF (GAUZE/BANDAGES/DRESSINGS) ×2 IMPLANT
BNDG ELASTIC 6X5.8 VLCR STR LF (GAUZE/BANDAGES/DRESSINGS) ×2 IMPLANT
BOWL SMART MIX CTS (DISPOSABLE) ×2 IMPLANT
BSPLAT TIB 9 CMNT ROT PLAT STR (Knees) ×1 IMPLANT
CEMENT HV SMART SET (Cement) ×4 IMPLANT
COVER SURGICAL LIGHT HANDLE (MISCELLANEOUS) ×2 IMPLANT
COVER WAND RF STERILE (DRAPES) ×2 IMPLANT
CUFF TOURN SGL QUICK 34 (TOURNIQUET CUFF) ×2
CUFF TRNQT CYL 34X4.125X (TOURNIQUET CUFF) ×1 IMPLANT
DECANTER SPIKE VIAL GLASS SM (MISCELLANEOUS) ×2 IMPLANT
DERMABOND ADVANCED (GAUZE/BANDAGES/DRESSINGS) ×1
DERMABOND ADVANCED .7 DNX12 (GAUZE/BANDAGES/DRESSINGS) ×1 IMPLANT
DRAPE U-SHAPE 47X51 STRL (DRAPES) ×2 IMPLANT
DRESSING AQUACEL AG SP 3.5X10 (GAUZE/BANDAGES/DRESSINGS) ×1 IMPLANT
DRSG AQUACEL AG ADV 3.5X10 (GAUZE/BANDAGES/DRESSINGS) ×2 IMPLANT
DRSG AQUACEL AG SP 3.5X10 (GAUZE/BANDAGES/DRESSINGS) ×2
DRSG TEGADERM 4X4.75 (GAUZE/BANDAGES/DRESSINGS) ×2 IMPLANT
DURAPREP 26ML APPLICATOR (WOUND CARE) ×4 IMPLANT
ELECT REM PT RETURN 15FT ADLT (MISCELLANEOUS) ×2 IMPLANT
EVACUATOR 1/8 PVC DRAIN (DRAIN) ×2 IMPLANT
GAUZE SPONGE 2X2 8PLY STRL LF (GAUZE/BANDAGES/DRESSINGS) ×1 IMPLANT
GLOVE ECLIPSE 8.0 STRL XLNG CF (GLOVE) ×2 IMPLANT
GLOVE SRG 8 PF TXTR STRL LF DI (GLOVE) ×1 IMPLANT
GLOVE SURG ORTHO LTX SZ9 (GLOVE) ×2 IMPLANT
GLOVE SURG POLYISO LF SZ7 (GLOVE) ×2 IMPLANT
GLOVE SURG UNDER POLY LF SZ7.5 (GLOVE) ×2 IMPLANT
GLOVE SURG UNDER POLY LF SZ8 (GLOVE) ×2
GOWN STRL REUS W/TWL XL LVL3 (GOWN DISPOSABLE) ×4 IMPLANT
HANDPIECE INTERPULSE COAX TIP (DISPOSABLE) ×2
JET LAVAGE IRRISEPT WOUND (IRRIGATION / IRRIGATOR) ×2
KIT TURNOVER KIT A (KITS) ×2 IMPLANT
LAVAGE JET IRRISEPT WOUND (IRRIGATION / IRRIGATOR) ×1 IMPLANT
NS IRRIG 1000ML POUR BTL (IV SOLUTION) ×2 IMPLANT
PACK TOTAL KNEE CUSTOM (KITS) ×2 IMPLANT
PENCIL SMOKE EVACUATOR (MISCELLANEOUS) IMPLANT
PIN DRILL FIX HALF THREAD (BIT) ×2 IMPLANT
PIN STEINMAN FIXATION KNEE (PIN) ×2 IMPLANT
PROTECTOR NERVE ULNAR (MISCELLANEOUS) IMPLANT
SET HNDPC FAN SPRY TIP SCT (DISPOSABLE) ×1 IMPLANT
SET PAD KNEE POSITIONER (MISCELLANEOUS) ×2 IMPLANT
SPONGE GAUZE 2X2 STER 10/PKG (GAUZE/BANDAGES/DRESSINGS) ×1
SPONGE LAP 18X18 RF (DISPOSABLE) IMPLANT
SPONGE SURGIFOAM ABS GEL 100 (HEMOSTASIS) ×2 IMPLANT
STOCKINETTE 6  STRL (DRAPES) ×2
STOCKINETTE 6 STRL (DRAPES) ×1 IMPLANT
SUT BONE WAX W31G (SUTURE) IMPLANT
SUT MNCRL AB 3-0 PS2 18 (SUTURE) ×2 IMPLANT
SUT VIC AB 1 CT1 27 (SUTURE) ×6
SUT VIC AB 1 CT1 27XBRD ANTBC (SUTURE) ×3 IMPLANT
SUT VIC AB 2-0 CT1 27 (SUTURE) ×4
SUT VIC AB 2-0 CT1 TAPERPNT 27 (SUTURE) ×2 IMPLANT
SUT VLOC 180 0 24IN GS25 (SUTURE) ×2 IMPLANT
SYR 50ML LL SCALE MARK (SYRINGE) ×2 IMPLANT
TAPE STRIPS DRAPE STRL (GAUZE/BANDAGES/DRESSINGS) ×2 IMPLANT
TIBIAL BASE ATTUNE KNEE SZ9 (Knees) ×2 IMPLANT
TRAY CATH INTERMITTENT SS 16FR (CATHETERS) ×2 IMPLANT
WATER STERILE IRR 1000ML POUR (IV SOLUTION) ×4 IMPLANT
WRAP KNEE MAXI GEL POST OP (GAUZE/BANDAGES/DRESSINGS) ×2 IMPLANT

## 2020-10-04 NOTE — Anesthesia Procedure Notes (Signed)
Spinal  Patient location during procedure: OR Start time: 10/04/2020 7:45 AM End time: 10/04/2020 7:50 AM Staffing Performed: anesthesiologist  Anesthesiologist: [name], MD Preanesthetic Checklist Completed: patient identified, IV checked, risks and benefits discussed, surgical consent, monitors and equipment checked, pre-op evaluation and timeout performed Spinal Block Patient position: sitting Prep: DuraPrep Patient monitoring: cardiac monitor, continuous pulse ox and blood pressure Approach: midline Location: L3-4 Injection technique: single-shot Needle Needle type: Pencan  Needle gauge: 24 G Needle length: 9 cm Additional Notes Functioning IV was confirmed and monitors were applied. Sterile prep and drape, including hand hygiene and sterile gloves were used. The patient was positioned and the spine was prepped. The skin was anesthetized with lidocaine.  Free flow of clear CSF was obtained prior to injecting local anesthetic into the CSF.  The spinal needle aspirated freely following injection.  The needle was carefully withdrawn.  The patient tolerated the procedure well.

## 2020-10-04 NOTE — Anesthesia Procedure Notes (Signed)
Anesthesia Regional Block: Adductor canal block   Pre-Anesthetic Checklist: ,, timeout performed, Correct Patient, Correct Site, Correct Laterality, Correct Procedure, Correct Position, site marked, Risks and benefits discussed,  Surgical consent,  Pre-op evaluation,  At surgeon's request and post-op pain management  Laterality: Left and Lower  Prep: chloraprep       Needles:  Injection technique: Single-shot  Needle Type: Echogenic Needle     Needle Length: 9cm  Needle Gauge: 22     Additional Needles:   Procedures:,,,, ultrasound used (permanent image in chart),,,,  Narrative:  Start time: 10/04/2020 7:05 AM End time: 10/04/2020 7:11 AM Injection made incrementally with aspirations every 5 mL.  Performed by: Personally  Anesthesiologist: Barnet Glasgow, MD  Additional Notes: Block assessed prior to surgery. Pt tolerated procedure well.

## 2020-10-04 NOTE — Evaluation (Signed)
Physical Therapy Evaluation Patient Details Name: RAZA BAYLESS MRN: [id number] DOB: [birth year] Today's Date: 10/04/2020   History of Present Illness  patient is a 65 y.o. male s/p Lt TKA on 10/04/2020 with PMH significant for polymyositis, OA, and Lt ankle and wrist surgery.  Clinical Impression  Pt is a 64y.o. male s/p Lt TKA POD 0. Pt reports that he is independent with mobility at baseline. Pt required MIN assist and verbal cues for sit to stand transfers. and required MIN assist for stand pivot to recliner with verbal cues for RW management and step to gait pattern with no LOB or knee buckling. Pt was limited with further mobility by increased pain. PT reviewed therapeutic interventions for promotion of DVT prevention. Pt will have assistance form his wife and family members upon discharge. Pt will benefit from skilled PT to increase independence and safety with mobility. Acute therapy to follow up during stay.      Follow Up Recommendations Follow surgeon's recommendation for DC plan and follow-up therapies;Outpatient PT    Equipment Recommendations  None recommended by PT (pt owns RW)    Recommendations for Other Services       Precautions / Restrictions Precautions Precautions: Fall Restrictions Weight Bearing Restrictions: No Other Position/Activity Restrictions: WBAT      Mobility  Bed Mobility Overal bed mobility: Needs Assistance Bed Mobility: Supine to Sit     Supine to sit: HOB elevated;Supervision     General bed mobility comments: use of bed rail and B UEs to scoot to EOB with supervision for safety.    Transfers Overall transfer level: Needs assistance Equipment used: Rolling walker (2 wheeled) Transfers: Sit to/from [facility] Sit to Stand: From elevated surface;Min assist Stand pivot transfers: Min assist       General transfer comment: MIN assist for power up and steadying in standing and cues for safe hand placement. Pt  performed pre gait marching with MIN assist and use of B UEs on RW with no LOB or knee buckling. MIN assist for stand pivot to recliner for safety with cues for sequencing and RW management. Pt demonstrated intermittent NWB on Lt LE 2/2 increased pain. PT educated pt on incr WB through B UEs on RW to decrease loading on Lt leg for pain management.  Ambulation/Gait                Stairs            Wheelchair Mobility    Modified Rankin (Stroke Patients Only)       Balance Overall balance assessment: Needs assistance Sitting-balance support: Feet supported Sitting balance-Leahy Scale: Good     Standing balance support: Bilateral upper extremity supported;During functional activity Standing balance-Leahy Scale: Poor Standing balance comment: use of RW to maintain standing balance                             Pertinent Vitals/Pain Pain Assessment: 0-10 Pain Score: 9  Pain Location: Lt TKA Pain Descriptors / Indicators: Sore;Tender;Discomfort;Grimacing;Guarding Pain Intervention(s): Limited activity within patient's tolerance;Monitored during session;Repositioned;Ice applied    Home Living Family/patient expects to be discharged to:: Private residence Living Arrangements: Spouse/significant other Available Help at Discharge: Family Type of Home: House Home Access: Magazine: [place]: Environmental consultant - 2 wheels;Cane - single point;Wheelchair - [employer];Shower seat Additional Comments: pt stays on main level of house. Will have assistance from his  wife and family members at home.    Prior Function Level of Independence: Independent               Hand Dominance   Dominant Hand: Right    Extremity/Trunk Assessment   Upper Extremity Assessment Upper Extremity Assessment: Overall WFL for tasks assessed    Lower Extremity Assessment Lower Extremity Assessment: LLE deficits/detail LLE Deficits /  Details: pt with fair quad set strength and 4+/5 B dorsi/plantar flexion strength. Pt with mild extensor lag when performing Rt SLR. LLE Sensation: WNL LLE Coordination: WNL    Cervical / Trunk Assessment Cervical / Trunk Assessment: Normal  Communication   Communication: No difficulties  Cognition Arousal/Alertness: Awake/alert Behavior During Therapy: WFL for tasks assessed/performed Overall Cognitive Status: Within Functional Limits for tasks assessed                                        General Comments      Exercises Total Joint Exercises Ankle Circles/Pumps: AROM;Both;20 reps;Seated   Assessment/Plan    PT Assessment Patient needs continued PT services  PT Problem List Decreased strength;Decreased range of motion;Decreased balance;Decreased activity tolerance;Decreased mobility;Decreased coordination;Decreased knowledge of use of DME;Pain       PT Treatment Interventions DME instruction;Gait training;Stair training;Therapeutic activities;Functional mobility training;Therapeutic exercise;Balance training;Patient/family education    PT Goals (Current goals can be found in the Care Plan section)  Acute Rehab PT Goals Patient Stated Goal: none stated PT Goal Formulation: With patient/family Time For Goal Achievement: 10/11/20 Potential to Achieve Goals: Good    Frequency 7X/week   Barriers to discharge        Co-evaluation               AM-PAC PT "6 Clicks" Mobility  Outcome Measure Help needed turning from your back to your side while in a flat bed without using bedrails?: None Help needed moving from lying on your back to sitting on the side of a flat bed without using bedrails?: A Little Help needed moving to and from a bed to a chair (including a wheelchair)?: A Little Help needed standing up from a chair using your arms (e.g., wheelchair or bedside chair)?: A Little Help needed to walk in hospital room?: A Little Help needed climbing  3-5 steps with a railing? : A Little 6 Click Score: 19    End of Session Equipment Utilized During Treatment: Gait belt Activity Tolerance: Patient limited by pain Patient left: in chair;with call bell/phone within reach;with nursing/sitter in room;with family/visitor present Nurse Communication: Mobility status PT Visit Diagnosis: Unsteadiness on feet (R26.81);Muscle weakness (generalized) (M62.81);Pain Pain - Right/Left: Left Pain - part of body: Knee    Time: 1431-1449 PT Time Calculation (min) (ACUTE ONLY): 18 min   Charges:   PT Evaluation $PT Eval Low Complexity: 1 Low         Ranny Wiebelhaus, SPT  Acute rehab    Lela Murfin 10/04/2020, 3:57 PM

## 2020-10-04 NOTE — Interval H&P Note (Signed)
History and Physical Interval Note:  10/04/2020 7:40 AM  Jacob Hunter  has presented today for surgery, with the diagnosis of Left knee osteoarthritis.  The various methods of treatment have been discussed with the patient and family. After consideration of risks, benefits and other options for treatment, the patient has consented to  Procedure(s) with comments: TOTAL KNEE ARTHROPLASTY (Left) - adductor canal as a surgical intervention.  The patient's history has been reviewed, patient examined, no change in status, stable for surgery.  I have reviewed the patient's chart and labs.  Questions were answered to the patient's satisfaction.     Rodolfo Gaster ANDREW

## 2020-10-04 NOTE — Anesthesia Postprocedure Evaluation (Signed)
Anesthesia Post Note  Patient: Jacob Hunter  Procedure(s) Performed: TOTAL KNEE ARTHROPLASTY (Left Knee)     Patient location during evaluation: PACU Anesthesia Type: Regional Level of consciousness: oriented and awake and alert Pain management: pain level controlled Vital Signs Assessment: post-procedure vital signs reviewed and stable Respiratory status: spontaneous breathing and respiratory function stable Cardiovascular status: blood pressure returned to baseline and stable Postop Assessment: no headache, no backache and no apparent nausea or vomiting Anesthetic complications: no   No complications documented.  Last Vitals:  Vitals:   10/04/20 1323 10/04/20 1359  BP: 131/87 127/82  Pulse: 76 79  Resp: 16 16  Temp: 36.4 C 36.4 C  SpO2: 99% 99%    Last Pain:  Vitals:   10/04/20 1359  TempSrc: Oral  PainSc:                  [name]

## 2020-10-04 NOTE — Transfer of Care (Signed)
Immediate Anesthesia Transfer of Care Note  Patient: Jacob Hunter  Procedure(s) Performed: Procedure(s) with comments: TOTAL KNEE ARTHROPLASTY (Left) - adductor canal  Patient Location: PACU  Anesthesia Type:MAC, Regional and Spinal  Level of Consciousness: Patient easily awoken, sedated, comfortable, cooperative, following commands, responds to stimulation.   Airway & Oxygen Therapy: Patient spontaneously breathing, ventilating well, oxygen via simple oxygen mask.  Post-op Assessment: Report given to PACU RN, vital signs reviewed and stable, moving all extremities.   Post vital signs: Reviewed and stable.  Complications: No apparent anesthesia complications  Last Vitals:  Vitals Value Taken Time  BP 147/78 10/04/20 1010  Temp    Pulse 73 10/04/20 1012  Resp 13 10/04/20 1012  SpO2 100 % 10/04/20 1012  Vitals shown include unvalidated device data.  Last Pain:  Vitals:   10/04/20 0614  TempSrc: Oral         Complications: No complications documented.

## 2020-10-04 NOTE — Op Note (Signed)
DATE OF SURGERY:  10/04/2020  TIME: 9:41 AM  PATIENT NAME:  Jacob Hunter    AGE: 65 y.o.   PRE-OPERATIVE DIAGNOSIS:  Left knee osteoarthritis  POST-OPERATIVE DIAGNOSIS:  Left knee osteoarthritis  PROCEDURE:  Procedure(s): TOTAL KNEE ARTHROPLASTY  SURGEON:  Jazmaine Fuelling ANDREW  ASSISTANT:  Leeanne Haus, PA-C, present and scrubbed throughout the case, critical for assistance with exposure, retraction, instrumentation, and closure.  OPERATIVE IMPLANTS: Depuy PFC Attune Rotating Platform.  Femur size 9, Tibia size 9, Patella size 41 3-peg oval button, with a 8 mm polyethylene insert.   PREOPERATIVE INDICATIONS:   Jacob Hunter is a 65 y.o. year old male with end stage bone on bone arthritis of the knee who failed conservative treatment and elected for Total Knee Arthroplasty.   The risks, benefits, and alternatives were discussed at length including but not limited to the risks of infection, bleeding, nerve injury, stiffness, blood clots, the need for revision surgery, cardiopulmonary complications, among others, and they were willing to proceed.  OPERATIVE DESCRIPTION:  The patient was brought to the operative room and placed in a supine position.  Spinal anesthesia was administered.  IV antibiotics were given.  The lower extremity was prepped and draped in the usual sterile fashion.  Time out was performed.  The leg was elevated and exsanguinated and the tourniquet was inflated.  Anterior quadriceps tendon splitting approach was performed.  The patella was retracted and osteophytes were removed.  The anterior horn of the medial and lateral meniscus was removed and cruciate ligaments resected.   The distal femur was opened with the drill and the intramedullary distal femoral cutting jig was utilized, set at 5 degrees resecting 10 mm off the distal femur.  Care was taken to protect the collateral ligaments.  The distal femoral sizing jig was applied, taking care to avoid  notching.  Then the 4-in-1 cutting jig was applied and the anterior and posterior femur was cut, along with the chamfer cuts.    Then the extramedullary tibial cutting jig was utilized making the appropriate cut using the anterior tibial crest as a reference building in appropriate posterior slope.  Care was taken during the cut to protect the medial and collateral ligaments.  The proximal tibia was removed along with the posterior horns of the menisci.   The posterior medial femoral osteophytes and posterior lateral femoral osteophytes were removed.    The flexion gap was then measured and was symmetric with the extension gap, measured at 8.  I completed the distal femoral preparation using the appropriate jig to prepare the box.  The patella was then measured, and cut with the saw.    The proximal tibia sized and prepared accordingly with the reamer and the punch, and then all components were trialed with the trial insert.  The knee was found to have excellent balance and full motion.    The above named components were then cemented into place and all excess cement was removed.  The trial polyethylene component was in place during cementation, and then was exchanged for the real polyethylene component.    The knee was easily taken through a range of motion and the patella tracked well and the knee irrigated copiously and the parapatellar and subcutaneous tissue closed with vicryl, and monocryl with steri strips for the skin.  The arthrotomy was closed at 90 of flexion. The wounds were dressed with sterile gauze and the tourniquet released and the patient was awakened and returned to the PACU in  stable and satisfactory condition.  There were no complications.  Total tourniquet time was 78 minutes.

## 2020-10-04 NOTE — Anesthesia Procedure Notes (Signed)
Procedure Name: MAC Date/Time: 10/04/2020 7:54 AM Performed by: Deliah Boston, CRNA Pre-anesthesia Checklist: Patient identified, Emergency Drugs available, Suction available and Patient being monitored Patient Re-evaluated:Patient Re-evaluated prior to induction Oxygen Delivery Method: Simple face mask Preoxygenation: Pre-oxygenation with 100% oxygen Induction Type: IV induction Placement Confirmation: positive ETCO2 and breath sounds checked- equal and bilateral

## 2020-10-05 DIAGNOSIS — M1712 Unilateral primary osteoarthritis, left knee: Secondary | ICD-10-CM | POA: Diagnosis not present

## 2020-10-05 MED ORDER — METHOCARBAMOL 500 MG PO TABS: 500.0000 mg | ORAL_TABLET | Freq: Four times a day (QID) | ORAL | 0 refills | Status: DC

## 2020-10-05 MED ORDER — ONDANSETRON HCL 4 MG PO TABS: 4.0000 mg | ORAL_TABLET | Freq: Every day | ORAL | 1 refills | Status: AC | PRN

## 2020-10-05 MED ORDER — OXYCODONE HCL 5 MG PO TABS
5.0000 mg | ORAL_TABLET | ORAL | 0 refills | Status: AC | PRN
Start: 2020-10-05 — End: 2020-10-12

## 2020-10-05 NOTE — Progress Notes (Signed)
Patient discharged to home w/ family. Given all belongings, instructions. Verbalized understanding of instructions. Escorted to pov via w/c. 

## 2020-10-05 NOTE — Progress Notes (Signed)
Subjective: 1 Day Post-Op Procedure(s) (LRB): TOTAL KNEE ARTHROPLASTY (Left) Patient reports pain as 7 on 0-10 scale.   He was able to get up with PT yesterday, min ambulation with RW He has minimal void, -flatus No complaints overnight  Objective: Vital signs in last 24 hours: Temp:  [97.3 F (36.3 C)-98 F (36.7 C)] 97.4 F (36.3 C) (02/24 0559) Pulse Rate:  [65-79] 75 (02/24 0559) Resp:  [11-18] 16 (02/24 0559) BP: (109-143)/(67-92) 127/82 (02/24 0559) SpO2:  [94 %-100 %] 97 % (02/24 0559) Weight:  [101.6 kg] 101.6 kg (02/23 1404)  Intake/Output from previous day: 02/23 0701 - 02/24 0700 In: 1513.5 [P.O.:480; I.V.:1001.9; IV Piggyback:31.6] Out: 1200 [Urine:1150; Blood:50] Intake/Output this shift: No intake/output data recorded.  Recent Labs    10/04/20 1026  HGB 11.0*   Recent Labs    10/04/20 1026  WBC 6.0  RBC 3.54*  HCT 34.7*  PLT 157   Recent Labs    10/04/20 1026  CREATININE 0.92   No results for input(s): LABPT, INR in the last 72 hours.  Neurologically intact Neurovascular intact Sensation intact distally Intact pulses distally Dorsiflexion/Plantar flexion intact Limited ROM of left knee at this time Incision: dressing C/D/I No cellulitis present Compartment soft   Assessment/Plan: 1 Day Post-Op Procedure(s) (LRB): TOTAL KNEE ARTHROPLASTY (Left) Up with therapy, If patient is able to get up with PT this am he is okay to be discharged this afternoon Will get Lovenox for DVT prophylaxis while in hospital, when goes home ASA 81 mg BID starting next morning  All medications will be sent to pharmacy Will follow up in the office in 2 weeks Has OPPT set up already    Patient's anticipated LOS is less than 2 midnights, meeting these requirements: - Younger than 23 - Lives within 1 hour of care - Has a competent adult at home to recover with post-op recover - NO history of  - Chronic pain requiring opiods  - Diabetes  - Coronary Artery  Disease  - Heart failure  - Heart attack  - Stroke  - DVT/VTE  - Cardiac arrhythmia  - Respiratory Failure/COPD  - Renal failure  - Anemia  - Advanced Liver disease       Derrick Ravel [phone] 10/05/2020, 6:35 AM

## 2020-10-05 NOTE — Discharge Summary (Signed)
Physician Discharge Summary  Patient ID: Jacob Hunter MRN: [id number] DOB/AGE: [birth year] 63 y.o.  Admit date: 10/04/2020 Discharge date: 10/05/2020  Admission Diagnoses: Left knee osteoarthritis  Discharge Diagnoses:  Active Problems:   Osteoarthritis of left knee   Discharged Condition: good  Hospital Course: Patient was admitted 10/04/2020 for a left total knee arthroplasty due to end stage osteoarthritis. He was admitted for overnight observation. He did well during surgery no events. Was stable in PACU, sent to postop floor in stable condition. No events overnight. Having difficulty with voiding and flatus. Postop day 1 patient doing well. He was able to work with PT in the am and did well. He was sent home with medications, oxycodone, zofran, methocarbamol. He is going to start ASA 81 mg BID for DVT prophylaxis.   Consults: None  Significant Diagnostic Studies: none  Treatments: IV hydration, antibiotics: Ancef, analgesia: acetaminophen and oxycodone, anticoagulation: Lovenox, therapies: PT and surgery: left TKA  Discharge Exam: Blood pressure 127/82, pulse 75, temperature (!) 97.4 F (36.3 C), temperature source Oral, resp. rate 16, height 6\' 1"  (1.854 m), weight 101.6 kg, SpO2 97 %. General appearance: alert, cooperative, appears stated age and no distress Extremities: extremities normal, atraumatic, no cyanosis or edema and Homans sign is negative, no sign of DVT Pulses: 2+ and symmetric Skin: Skin color, texture, turgor normal. No rashes or lesions Neurologic: Grossly normal Incision/Wound: dressings are clean, dry and intact  Disposition: Discharge disposition: 01-Home or Self Care       Discharge Instructions    Call MD / Call 911   Complete by: As directed    If you experience chest pain or shortness of breath, CALL 911 and be transported to the hospital emergency room.  If you develope a fever above 101 F, pus (white drainage) or increased drainage or  redness at the wound, or calf pain, call your surgeon's office.   Constipation Prevention   Complete by: As directed    Drink plenty of fluids.  Prune juice may be helpful.  You may use a stool softener, such as Colace (over the counter) 100 mg twice a day.  Use MiraLax (over the counter) for constipation as needed.   Diet - low sodium heart healthy   Complete by: As directed    Discharge instructions   Complete by: As directed    Dr. Sydnee Cabal [facility] [address] [place], [place] [address] [phone]  TOTAL KNEE REPLACEMENT POSTOPERATIVE DIRECTIONS  Knee Rehabilitation, Guidelines Following Surgery  Results after knee surgery are often greatly improved when you follow the exercise, range of motion and muscle strengthening exercises prescribed by your doctor. Safety measures are also important to protect the knee from further injury. Any time any of these exercises cause you to have increased pain or swelling in your knee joint, decrease the amount until you are comfortable again and slowly increase them. If you have problems or questions, call your caregiver or physical therapist for advice.   HOME CARE INSTRUCTIONS  Remove items at home which could result in a fall. This includes throw rugs or furniture in walking pathways.  ICE to the affected knee every three hours for 30 minutes at a time and then as needed for pain and swelling.  Continue to use ice on the knee for pain and swelling from surgery. You may notice swelling that will progress down to the foot and ankle.  This is normal after surgery.  Elevate the leg when you are  not up walking on it.   Continue to use the breathing machine which will help keep your temperature down.  It is common for your temperature to cycle up and down following surgery, especially at night when you are not up moving around and exerting yourself.  The breathing machine keeps your lungs expanded and your temperature down. Do not  place pillow under knee, focus on keeping the knee straight while resting   DIET You may resume your previous home diet once your are discharged from the hospital.  DRESSING / WOUND CARE / SHOWERING Keep the surgical dressing until follow up.  The dressing is water proof, but you need to put extra covering over it like plastic wrap.  IF THE DRESSING FALLS OFF or the wound gets wet inside, change the dressing with sterile gauze.  Please use good hand washing techniques before changing the dressing.  Do not use any lotions or creams on the incision until instructed by your surgeon.   You may start showering once you are discharged home but do not submerge the incision under water.  You are sent home with an ACE bandage on over the leg, this can be removed at 3 days from surgery. At this time you can start showering. Please place the white TED stocking on the surgical leg after. This needs to be worn on the surgical leg for 2 weeks after surgery.   ACTIVITY Walk with your walker as instructed. Use walker as long as suggested by your caregivers. Avoid periods of inactivity such as sitting longer than an hour when not asleep. This helps prevent blood clots.  You may resume a sexual relationship in one month or when given the OK by your doctor.  You may return to work once you are cleared by your doctor.  Do not drive a car for 6 weeks or until released by you surgeon.  Do not drive while taking narcotics.  WEIGHT BEARING Weight bearing as tolerated with assist device (walker, cane, etc) as directed, use it as long as suggested by your surgeon or therapist, typically at least 4-6 weeks.  POSTOPERATIVE CONSTIPATION PROTOCOL Constipation - defined medically as fewer than three stools per week and severe constipation as less than one stool per week.  One of the most common issues patients have following surgery is constipation.  Even if you have a regular bowel pattern at home, your normal regimen is  likely to be disrupted due to multiple reasons following surgery.  Combination of anesthesia, postoperative narcotics, change in appetite and fluid intake all can affect your bowels.  In order to avoid complications following surgery, here are some recommendations in order to help you during your recovery period.  Colace (docusate) - Pick up an over-the-counter form of Colace or another stool softener and take twice a day as long as you are requiring postoperative pain medications.  Take with a full glass of water daily.  If you experience loose stools or diarrhea, hold the colace until you stool forms back up.  If your symptoms do not get better within 1 week or if they get worse, check with your doctor.  Dulcolax (bisacodyl) - Pick up over-the-counter and take as directed by the product packaging as needed to assist with the movement of your bowels.  Take with a full glass of water.  Use this product as needed if not relieved by Colace only.   MiraLax (polyethylene glycol) - Pick up over-the-counter to have on hand.  MiraLax is  a solution that will increase the amount of water in your bowels to assist with bowel movements.  Take as directed and can mix with a glass of water, juice, soda, coffee, or tea.  Take if you go more than two days without a movement. Do not use MiraLax more than once per day. Call your doctor if you are still constipated or irregular after using this medication for 7 days in a row.  If you continue to have problems with postoperative constipation, please contact the office for further assistance and recommendations.  If you experience "the worst abdominal pain ever" or develop nausea or vomiting, please contact the office immediatly for further recommendations for treatment.  ITCHING  If you experience itching with your medications, try taking only a single pain pill, or even half a pain pill at a time.  You can also use Benadryl over the counter for itching or also to help with  sleep.   TED HOSE STOCKINGS Wear the elastic stockings on both legs for two weeks following surgery during the day but you may remove then at night for sleeping.  Okay to remove ACE in 3 days, put TED on after  MEDICATIONS See your medication summary on the "After Visit Summary" that the nursing staff will review with you prior to discharge.  You may have some home medications which will be placed on hold until you complete the course of blood thinner medication.  It is important for you to complete the blood thinner medication as prescribed by your surgeon.  Continue your approved medications as instructed at time of discharge. If you were not previously taking any blood thinners prior to surgery please start taking Aspirin 325 mg tabs twice daily for 6 weeks. If you are unable to take Aspirin please let your doctor know.   PRECAUTIONS If you experience chest pain or shortness of breath - call 911 immediately for transfer to the hospital emergency department.  If you develop a fever greater that 101 F, purulent drainage from wound, increased redness or drainage from wound, foul odor from the wound/dressing, or calf pain - CONTACT YOUR SURGEON.                                                   FOLLOW-UP APPOINTMENTS Make sure you keep all of your appointments after your operation with your surgeon and caregivers. You should call the office at the above phone number and make an appointment for approximately two weeks after the date of your surgery or on the date instructed by your surgeon outlined in the "After Visit Summary".   RANGE OF MOTION AND STRENGTHENING EXERCISES  Rehabilitation of the knee is important following a knee injury or an operation. After just a few days of immobilization, the muscles of the thigh which control the knee become weakened and shrink (atrophy). Knee exercises are designed to build up the tone and strength of the thigh muscles and to improve knee motion. Often times  heat used for twenty to thirty minutes before working out will loosen up your tissues and help with improving the range of motion but do not use heat for the first two weeks following surgery. These exercises can be done on a training (exercise) mat, on the floor, on a table or on a bed. Use what ever works the best and  is most comfortable for you Knee exercises include:  Leg Lifts - While your knee is still immobilized in a splint or cast, you can do straight leg raises. Lift the leg to 60 degrees, hold for 3 sec, and slowly lower the leg. Repeat 10-20 times 2-3 times daily. Perform this exercise against resistance later as your knee gets better.  Quad and Hamstring Sets - Tighten up the muscle on the front of the thigh (Quad) and hold for 5-10 sec. Repeat this 10-20 times hourly. Hamstring sets are done by pushing the foot backward against an object and holding for 5-10 sec. Repeat as with quad sets.  Leg Slides: Lying on your back, slowly slide your foot toward your buttocks, bending your knee up off the floor (only go as far as is comfortable). Then slowly slide your foot back down until your leg is flat on the floor again. Angel Wings: Lying on your back spread your legs to the side as far apart as you can without causing discomfort.  A rehabilitation program following serious knee injuries can speed recovery and prevent re-injury in the future due to weakened muscles. Contact your doctor or a physical therapist for more information on knee rehabilitation.   IF YOU ARE TRANSFERRED TO A SKILLED REHAB FACILITY If the patient is transferred to a skilled rehab facility following release from the hospital, a list of the current medications will be sent to the facility for the patient to continue.  When discharged from the skilled rehab facility, please have the facility set up the patient's [place] prior to being released. Also, the skilled facility will be responsible for providing the  patient with their medications at time of release from the facility to include their pain medication, the muscle relaxants, and their blood thinner medication. If the patient is still at the rehab facility at time of the two week follow up appointment, the skilled rehab facility will also need to assist the patient in arranging follow up appointment in our office and any transportation needs.  MAKE SURE YOU:  Understand these instructions.  Get help right away if you are not doing well or get worse.    Pick up stool softner and laxative for home use following surgery while on pain medications. May shower starting three days after surgery. Please use a clean towel to pat the leg dry following showers. Continue to use ice for pain and swelling after surgery. Do not use any lotions or creams on the incision until instructed by you Start Aspirin immediately following surgery.   Do not put a pillow under the knee. Place it under the heel.   Complete by: As directed    Driving restrictions   Complete by: As directed    No driving for two weeks   TED hose   Complete by: As directed    Use stockings (TED hose) for three weeks on both leg(s).  You may remove them at night for sleeping.   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   ascorbic acid 1000 MG tablet Commonly known as: VITAMIN C Take 1,000 mg by mouth daily.   calcium carbonate 500 MG chewable tablet Commonly known as: TUMS - dosed in mg elemental calcium Chew 1 tablet by mouth daily as needed  for heartburn.   calcium-vitamin D 500-200 MG-UNIT tablet Commonly known as: OSCAL WITH D Take 1 tablet by mouth daily with breakfast.   cholecalciferol 25 MCG (1000 UNIT) tablet Commonly known as: VITAMIN D3 Take 1,000 Units by mouth daily.   Co Q 10 100 MG Caps Take 100 mg by mouth daily.   folic acid 1 MG tablet Commonly known as: FOLVITE Take 1 tablet (1 mg total) by mouth daily.   methocarbamol 500 MG tablet Commonly known as: Robaxin Take 1 tablet (500 mg total) by mouth 4 (four) times daily.   methotrexate 2.5 MG tablet Commonly known as: RHEUMATREX Take 25 mg by mouth every Monday.   ondansetron 4 MG tablet Commonly known as: Zofran Take 1 tablet (4 mg total) by mouth daily as needed for nausea or vomiting.   oxyCODONE 5 MG immediate release tablet Commonly known as: Roxicodone Take 1 tablet (5 mg total) by mouth every 4 (four) hours as needed for up to 7 days.   predniSONE 10 MG tablet Commonly known as: DELTASONE Take 10 mg by mouth daily with breakfast.   PRESCRIPTION MEDICATION IVIG Antibodies 2 visits every 4 weeks   riTUXimab 100 MG/10ML injection Commonly known as: RITUXAN Inject 1,000 mg into the vein See admin instructions. 2 times a month in March and September   vitamin B-12 1000 MCG tablet Commonly known as: CYANOCOBALAMIN Take 1,000 mcg by mouth daily.            Discharge Care Instructions  (From admission, onward)         Start     Ordered   10/05/20 0000  Weight bearing as tolerated        10/05/20 0643           Signed: Nettie Elm [facility] [phone] 10/05/2020, 6:43 AM

## 2020-10-05 NOTE — TOC Transition Note (Signed)
Transition of Care [facility]) - CM/SW Discharge Note   Patient Details  Name: Jacob Hunter MRN: [id number] Date of Birth: Jun 21, 1956  [facility] of [facility] CM/SW Contact:  Lia Hopping, [place] Phone Number: 10/05/2020, 9:58 AM   Clinical Narrative:    Prearranged Therapy Plan: OPPT [name])  Patient confirm he has RW and 3 in1  Final next level of care: OP Rehab Barriers to Discharge: No Barriers Identified   Patient Goals and CMS Choice        Discharge Placement                       Discharge Plan and Services                                     Social Determinants of Health (SDOH) Interventions     Readmission Risk Interventions No flowsheet data found.

## 2020-10-05 NOTE — Progress Notes (Signed)
Physical Therapy Treatment Patient Details Name: Jacob Hunter MRN: [id number] DOB: 1955-11-09 Today's Date: 10/05/2020    History of Present Illness patient is a 65 y.o. male s/p Lt TKA on 10/04/2020 with PMH significant for polymyositis, OA, and Lt ankle and wrist surgery.    PT Comments    Pt ambulated 100' with RW, no loss of balance, distance limited by pain/fatigue. Instructed pt/spouse in TKA HEP. Stair training deferred as pt has a ramp to enter the home.  From PT standpoint, he is ready to DC home.   Follow Up Recommendations  Follow surgeon's recommendation for DC plan and follow-up therapies;Outpatient PT     Equipment Recommendations  None recommended by PT (pt owns RW)    Recommendations for Other Services       Precautions / Restrictions Precautions Precautions: Fall Restrictions Weight Bearing Restrictions: No Other Position/Activity Restrictions: WBAT    Mobility  Bed Mobility Overal bed mobility: Modified Independent Bed Mobility: Supine to Sit     Supine to sit: HOB elevated;Modified independent (Device/Increase time)     General bed mobility comments: use of bed rail and B UEs to scoot to EOB    Transfers Overall transfer level: Needs assistance Equipment used: Rolling walker (2 wheeled) Transfers: Sit to/from Stand Sit to Stand: From elevated surface;Supervision         General transfer comment: VCs hand placement  Ambulation/Gait Ambulation/Gait assistance: Min guard Gait Distance (Feet): 100 Feet Assistive device: Rolling walker (2 wheeled) Gait Pattern/deviations: Step-to pattern;Decreased step length - right;Decreased step length - left Gait velocity: decr   General Gait Details: slow but steady, 1 standing rest break 2* fatigue, no LOB   Stairs             Wheelchair Mobility    Modified Rankin (Stroke Patients Only)       Balance Overall balance assessment: Needs assistance Sitting-balance support: Feet  supported Sitting balance-Leahy Scale: Good     Standing balance support: Bilateral upper extremity supported;During functional activity Standing balance-Leahy Scale: Poor Standing balance comment: use of RW to maintain standing balance                            Cognition Arousal/Alertness: Awake/alert Behavior During Therapy: WFL for tasks assessed/performed Overall Cognitive Status: Within Functional Limits for tasks assessed                                        Exercises Total Joint Exercises Ankle Circles/Pumps: AROM;Both;20 reps;Seated Quad Sets: AROM;Left;5 reps;Supine Short Arc Quad: AROM;Left;10 reps;Supine Heel Slides: AAROM;Left;10 reps;Supine Hip ABduction/ADduction: AROM;Left;10 reps;Supine Straight Leg Raises: AROM;Left;5 reps;Supine Long Arc Quad: AROM;Left;5 reps;Seated Knee Flexion: AAROM;Left;10 reps;Seated Goniometric ROM: 5-70* AAROM L knee    General Comments        Pertinent Vitals/Pain Pain Score: 8  Pain Location: Lt knee Pain Descriptors / Indicators: Sore;Tender;Discomfort;Grimacing;Guarding Pain Intervention(s): Limited activity within patient's tolerance;Monitored during session;Premedicated before session;Ice applied;Repositioned    Home Living                      Prior Function            PT Goals (current goals can now be found in the care plan section) Acute Rehab PT Goals Patient Stated Goal: walk farther PT Goal Formulation: With patient/family Time For Goal Achievement: 10/11/20  Potential to Achieve Goals: Good Progress towards PT goals: Progressing toward goals    Frequency    7X/week      PT Plan Current plan remains appropriate    Co-evaluation              AM-PAC PT "6 Clicks" Mobility   Outcome Measure  Help needed turning from your back to your side while in a flat bed without using bedrails?: None Help needed moving from lying on your back to sitting on the side  of a flat bed without using bedrails?: None Help needed moving to and from a bed to a chair (including a wheelchair)?: None Help needed standing up from a chair using your arms (e.g., wheelchair or bedside chair)?: None Help needed to walk in hospital room?: None Help needed climbing 3-5 steps with a railing? : A Little 6 Click Score: 23    End of Session Equipment Utilized During Treatment: Gait belt Activity Tolerance: Patient limited by pain Patient left: in chair;with call bell/phone within reach;with family/visitor present Nurse Communication: Mobility status PT Visit Diagnosis: Unsteadiness on feet (R26.81);Muscle weakness (generalized) (M62.81);Pain Pain - Right/Left: Left Pain - part of body: Knee     Time: 1105-1140 PT Time Calculation (min) (ACUTE ONLY): 35 min  Charges:  $Gait Training: 8-22 mins $Therapeutic Exercise: 8-22 mins                     Blondell Reveal Kistler PT 10/05/2020  Acute Rehabilitation Services Pager [phone] Office [phone]

## 2020-10-06 ENCOUNTER — Other Ambulatory Visit: Payer: Self-pay

## 2020-10-06 ENCOUNTER — Encounter (HOSPITAL_COMMUNITY): Payer: PPO | Admitting: Physical Therapy

## 2020-10-06 ENCOUNTER — Ambulatory Visit (HOSPITAL_COMMUNITY): Payer: PPO | Attending: Physician Assistant

## 2020-10-06 ENCOUNTER — Encounter (HOSPITAL_COMMUNITY): Payer: Self-pay

## 2020-10-06 DIAGNOSIS — M6281 Muscle weakness (generalized): Secondary | ICD-10-CM | POA: Diagnosis not present

## 2020-10-06 DIAGNOSIS — M25562 Pain in left knee: Secondary | ICD-10-CM | POA: Diagnosis not present

## 2020-10-06 DIAGNOSIS — G8929 Other chronic pain: Secondary | ICD-10-CM | POA: Insufficient documentation

## 2020-10-06 DIAGNOSIS — R600 Localized edema: Secondary | ICD-10-CM | POA: Diagnosis not present

## 2020-10-06 NOTE — Therapy (Signed)
[place], [place], [address] Phone: [phone]   Fax:  [phone]  Physical Therapy Evaluation  Patient Details  Name: Jacob Hunter MRN: [id number] Date of Birth: 1956-03-17 Referring Provider (PT): Dr. Sydnee Cabal, MD   Encounter Date: 10/06/2020   PT End of Session - 10/06/20 1547    Visit Number 1    Number of Visits 18    Authorization Type [employer], no VL, no auth    PT Start Time 2979    PT Stop Time 1635    PT Time Calculation (min) 46 min    Activity Tolerance Patient limited by pain    Behavior During Therapy [facility] for tasks assessed/performed           Past Medical History:  Diagnosis Date  . Arthritis   . Knee pain   . Necrotizing myopathy   . Pneumonia   . Polymyositis [facility])     Past Surgical History:  Procedure Laterality Date  . ANKLE SURGERY Left   . COLONOSCOPY N/A 10/05/2019   Rourk: 3 colonic polyps removed, 2 were tubular adenomas, cecal polyp was sessile serrated polyp.  Advised 3-year surveillance colonoscopy.  [name] EYE SURGERY     bil cataracts  . KNEE SURGERY Left    arthroscopy x 2  . left wrist surgery    . POLYPECTOMY  10/05/2019   Procedure: POLYPECTOMY;  Surgeon: Daneil Dolin, MD;  [facility];  Service: Endoscopy;;  . TOTAL KNEE ARTHROPLASTY Left 10/04/2020   Procedure: TOTAL KNEE ARTHROPLASTY;  Surgeon: Sydnee Cabal, MD;  Location: [facility] ORS;  Service: Orthopedics;  Laterality: Left;  adductor canal    There were no vitals filed for this visit.    Subjective Assessment - 10/06/20 1551    Subjective Left TKR on 10/04/20 and since D/C to home with spouse.  No AD prior to surgery not working at this time.    Currently in Pain? Yes    Pain Score 7     Pain Location Knee    Pain Orientation Left    Pain Descriptors / Indicators Aching;Throbbing    Pain Type Surgical pain;Acute pain              [facility] PT Assessment - 10/06/20 0001       Assessment   Medical Diagnosis Left TKR    Referring Provider (PT) Dr. Sydnee Cabal, MD    Onset Date/Surgical Date 10/04/20      Balance Screen   Has the patient fallen in the past 6 months No    Has the patient had a decrease in activity level because of a fear of falling?  No    Is the patient reluctant to leave their home because of a fear of falling?  No      Home Ecologist residence    Living Arrangements Spouse/significant other    Available Help at Discharge Family    Type of [place] entrance    [place] - 2 wheels;Cane - single point;Shower seat - built in;Bedside commode      Prior Function   Level of Independence Independent    Vocation On disability    Leisure golf, fish, hunt      Observation/Other Assessments   Focus on Therapeutic Outcomes (FOTO)  26.4% function      ROM / Strength   AROM / PROM /  Strength AROM;PROM;Strength      AROM   AROM Assessment Site Knee    Right/Left Knee Right;Left    Left Knee Extension 15   lacking   Left Knee Flexion 75      Strength   Strength Assessment Site Knee    Right/Left Knee Right;Left    Right Knee Flexion 5/5    Right Knee Extension 5/5    Left Knee Flexion 3-/5    Left Knee Extension 2+/5      Transfers   Transfers Sit to Stand;Stand Pivot Transfers    Sit to Stand 6: Modified independent (Device/Increase time)      Ambulation/Gait   Ambulation/Gait Yes    Ambulation/Gait Assistance 6: Modified independent (Device/Increase time)    Ambulation Distance (Feet) 70 Feet    Assistive device Rolling walker    Gait Pattern Step-to pattern;Decreased stance time - left;Antalgic    Ambulation Surface Level    Gait velocity decreased    Stairs Yes    Stairs Assistance 4: Min guard    Stair Management Technique Two rails    Number of Stairs 1    Gait Comments 2MWT                      Objective measurements completed on  examination: See above findings.       [facility] Adult PT Treatment/Exercise - 10/06/20 0001      Exercises   Exercises Knee/Hip      Knee/Hip Exercises: Supine   Quad Sets Strengthening;Left;2 sets;10 reps    Heel Slides AAROM;Left;2 sets;10 reps      Manual Therapy   Manual Therapy Joint mobilization    Manual therapy comments completed separate rest of treatment    Joint Mobilization joint oscillations LLE on stability ball to decrease pain/guarding x 10 min                  PT Education - 10/06/20 1647    Education Details education on HEP activities and importance of LLE elevation above the level of the heart to reduce edema    Person(s) Educated Patient    Methods Explanation    Comprehension Verbalized understanding            PT Short Term Goals - 10/06/20 1649      PT SHORT TERM GOAL #1   Title Patient will report at least 25% improvement in symptoms for improved quality of life.    Time 4    Period Weeks    Status New    Target Date 11/03/20      PT SHORT TERM GOAL #2   Title Patient will demonstrate left knee flexion to 90 degrees and 5 degrees lacking extension to improve gait mechanics    Baseline 15 degrees lacking extension, 75 degrees flexion    Time 4    Period Weeks    Status New    Target Date 11/03/20      PT SHORT TERM GOAL #3   Title Demo improved ambulation as evident by distance of 150 ft during 2MWT    Baseline 70 ft with RW    Time 4    Period Weeks    Status New    Target Date 11/03/20             PT Long Term Goals - 10/06/20 1653      PT LONG TERM GOAL #1   Title Patient will be independent with HEP in  order to improve functional outcomes.    Time 6    Period Weeks    Status New    Target Date 11/17/20      PT LONG TERM GOAL #2   Title Patient will improve on FOTO score to meet predicted outcomes to improve functional independence    Baseline 26.4% function    Time 6    Period Weeks    Status New    Target  Date 11/17/20      PT LONG TERM GOAL #3   Title Demo 110 degrees left knee flexion and full extension in order to facilitate normalized gait pattern and transfers    Time 6    Period Weeks    Status New    Target Date 11/17/20                  Plan - 10/06/20 1608    Clinical Impression Statement Pt is 65 yo male s/p left TKR and demonstrates new onset of post-surgical pain, limited ROM, weakness, edema,  difficulty in walking, unsteadiness on feet, and increased need for assistacne in mobility and ADL due to deficits and limitations.  Patient would benefit from PT services to aid in this post-surgical recovery to regain function, decrease pain, eliminate edema, and restore capabilities to PLOF.    Personal Factors and Comorbidities Comorbidity 1    Comorbidities PMH    Examination-Activity Limitations Bed Mobility;Bend;Lift;Toileting;Stand;Stairs;Squat;Locomotion Level;Transfers    Examination-Participation Restrictions Cleaning;Community Activity;Interpersonal Relationship;Yard Work;Shop;Driving    Stability/Clinical Decision Making Stable/Uncomplicated    Clinical Decision Making Low    Rehab Potential Excellent    PT Frequency 3x / week    PT Duration 6 weeks    PT Treatment/Interventions ADLs/Self Care Home Management;Aquatic Therapy;Cryotherapy;Electrical Stimulation;DME Instruction;Moist Heat;Gait training;Stair training;Functional mobility training;Therapeutic activities;Therapeutic exercise;Balance training;Patient/family education;Neuromuscular re-education;Wheelchair mobility training;Manual techniques;Passive range of motion;Taping;Energy conservation;Spinal Manipulations;Joint Manipulations    PT Next Visit Plan Continue with TKR rehab protocol    PT Home Exercise Plan QS, heel slides,    Consulted and Agree with Plan of Care Patient           Patient will benefit from skilled therapeutic intervention in order to improve the following deficits and impairments:   Abnormal gait,Decreased activity tolerance,Decreased balance,Decreased mobility,Decreased endurance,Decreased range of motion,Decreased strength,Increased edema,Difficulty walking,Impaired perceived functional ability,Improper body mechanics,Pain  Visit Diagnosis: Chronic pain of left knee  Muscle weakness (generalized)  Localized edema     Problem List Patient Active Problem List   Diagnosis Date Noted  . Osteoarthritis of left knee 10/04/2020  . History of IBS 01/21/2020  . Chronic diarrhea 10/25/2019  . Back pain 07/26/2019  . Spinal stenosis 07/26/2019  . Polymyositis ([place]) 07/26/2019  . Pain in left knee 07/21/2018  . Anxiety and depression 12/30/2015  . Impaired mobility and activities of daily living 12/30/2015  . 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 Bilateral leg weakness 01/24/2015  . Elevated CPK 01/24/2015  . Transaminitis 10/24/2014   4:58 PM, 10/06/20 M. Sherlyn Lees, PT, DPT Physical Therapist- [place] Office Number: [phone]  [place] [address] [place], [place], [address] Phone: [phone]   Fax:  [phone]  Name: SAYER MASINI MRN: [id number] Date of Birth: [birth year]

## 2020-10-09 ENCOUNTER — Ambulatory Visit (HOSPITAL_COMMUNITY): Payer: PPO

## 2020-10-09 ENCOUNTER — Other Ambulatory Visit: Payer: Self-pay

## 2020-10-09 DIAGNOSIS — M6281 Muscle weakness (generalized): Secondary | ICD-10-CM

## 2020-10-09 DIAGNOSIS — G8929 Other chronic pain: Secondary | ICD-10-CM

## 2020-10-09 DIAGNOSIS — M25562 Pain in left knee: Secondary | ICD-10-CM

## 2020-10-09 DIAGNOSIS — R600 Localized edema: Secondary | ICD-10-CM

## 2020-10-09 NOTE — Therapy (Signed)
[place], [place], [address] Phone: [phone]   Fax:  [phone]  Physical Therapy Treatment  Patient Details  Name: Jacob Hunter MRN: [id number] Date of Birth: [birth year] Referring Provider (PT): Dr. Sydnee Cabal, MD   Encounter Date: 10/09/2020   PT End of Session - 10/09/20 0951    Visit Number 2    Number of Visits 18    Authorization Type [employer], no VL, no auth    PT Start Time [phone]    PT Stop Time 1030    PT Time Calculation (min) 42 min    Activity Tolerance Patient limited by pain    Behavior During Therapy [facility] for tasks assessed/performed           Past Medical History:  Diagnosis Date  . Arthritis   . Knee pain   . Necrotizing myopathy   . Pneumonia   . Polymyositis [facility])     Past Surgical History:  Procedure Laterality Date  . ANKLE SURGERY Left   . COLONOSCOPY N/A 10/05/2019   Rourk: 3 colonic polyps removed, 2 were tubular adenomas, cecal polyp was sessile serrated polyp.  Advised 3-year surveillance colonoscopy.  [name] EYE SURGERY     bil cataracts  . KNEE SURGERY Left    arthroscopy x 2  . left wrist surgery    . POLYPECTOMY  10/05/2019   Procedure: POLYPECTOMY;  Surgeon: Daneil Dolin, MD;  [facility];  Service: Endoscopy;;  . TOTAL KNEE ARTHROPLASTY Left 10/04/2020   Procedure: TOTAL KNEE ARTHROPLASTY;  Surgeon: Sydnee Cabal, MD;  [facility];  Service: Orthopedics;  Laterality: Left;  adductor canal    There were no vitals filed for this visit.   Subjective Assessment - 10/09/20 0954    Subjective Patient reports increased consistency with elevating LLE above level of the heart and this has helped edema in left knee considerably    Currently in Pain? Yes    Pain Score 5     Pain Location Knee    Pain Orientation Left    Pain Descriptors / Indicators Aching;Throbbing    Pain Type Surgical pain              [facility] PT Assessment  - 10/09/20 0001      Assessment   Medical Diagnosis Left TKR    Referring Provider (PT) Dr. Sydnee Cabal, MD    Onset Date/Surgical Date 10/04/20      AROM   Left Knee Extension 10   lacking   Left Knee Flexion 84                         [facility] Adult PT Treatment/Exercise - 10/09/20 0001      Knee/Hip Exercises: Aerobic   Recumbent Bike 6 min for AAROM Left knee      Knee/Hip Exercises: Standing   Heel Raises Both;2 sets;10 reps    Other Standing Knee Exercises knee flexion drive LLE 0S92 on 6" step      Knee/Hip Exercises: Seated   Long Arc Quad AROM;Left;2 sets;10 reps      Knee/Hip Exercises: Supine   Quad Sets Strengthening;Left;2 sets;10 reps    Heel Slides AAROM;Left;2 sets;10 reps                  PT Education - 10/09/20 1022    Education Details continued HEP reinforcement    Person(s) Educated Patient  Methods Explanation    Comprehension Verbalized understanding            PT Short Term Goals - 10/06/20 1649      PT SHORT TERM GOAL #1   Title Patient will report at least 25% improvement in symptoms for improved quality of life.    Time 4    Period Weeks    Status New    Target Date 11/03/20      PT SHORT TERM GOAL #2   Title Patient will demonstrate left knee flexion to 90 degrees and 5 degrees lacking extension to improve gait mechanics    Baseline 15 degrees lacking extension, 75 degrees flexion    Time 4    Period Weeks    Status New    Target Date 11/03/20      PT SHORT TERM GOAL #3   Title Demo improved ambulation as evident by distance of 150 ft during 2MWT    Baseline 70 ft with RW    Time 4    Period Weeks    Status New    Target Date 11/03/20             PT Long Term Goals - 10/06/20 1653      PT LONG TERM GOAL #1   Title Patient will be independent with HEP in order to improve functional outcomes.    Time 6    Period Weeks    Status New    Target Date 11/17/20      PT LONG TERM GOAL #2    Title Patient will improve on FOTO score to meet predicted outcomes to improve functional independence    Baseline 26.4% function    Time 6    Period Weeks    Status New    Target Date 11/17/20      PT LONG TERM GOAL #3   Title Demo 110 degrees left knee flexion and full extension in order to facilitate normalized gait pattern and transfers    Time 6    Period Weeks    Status New    Target Date 11/17/20                 Plan - 10/09/20 1006    Clinical Impression Statement Pt returns to clinic for f/u for tx of LLE TKR and continues to demo decreased knee ROM, difficulty in walking, gait deviations, and generalized weakness.  Improved activity tolerance requiring frequent seated rest periods due to fatigue and pain but able to progress with ROM activities in sitting/standing without adverse effects.  Continued tx indicated to progress ROM and strength to ambulate with least restrictive AD and normalized pattern    Personal Factors and Comorbidities Comorbidity 1    Comorbidities PMH    Examination-Activity Limitations Bed Mobility;Bend;Lift;Toileting;Stand;Stairs;Squat;Locomotion Level;Transfers    Examination-Participation Restrictions Cleaning;Community Activity;Interpersonal Relationship;Yard Work;Shop;Driving    Stability/Clinical Decision Making Stable/Uncomplicated    Rehab Potential Excellent    PT Frequency 3x / week    PT Duration 6 weeks    PT Treatment/Interventions ADLs/Self Care Home Management;Aquatic Therapy;Cryotherapy;Electrical Stimulation;DME Instruction;Moist Heat;Gait training;Stair training;Functional mobility training;Therapeutic activities;Therapeutic exercise;Balance training;Patient/family education;Neuromuscular re-education;Wheelchair mobility training;Manual techniques;Passive range of motion;Taping;Energy conservation;Spinal Manipulations;Joint Manipulations    PT Next Visit Plan Continue with TKR rehab protocol    PT Home Exercise Plan QS, heel slides,  knee flexion drive on 6" stair    Consulted and Agree with Plan of Care Patient           Patient  will benefit from skilled therapeutic intervention in order to improve the following deficits and impairments:  Abnormal gait,Decreased activity tolerance,Decreased balance,Decreased mobility,Decreased endurance,Decreased range of motion,Decreased strength,Increased edema,Difficulty walking,Impaired perceived functional ability,Improper body mechanics,Pain  Visit Diagnosis: Chronic pain of left knee  Muscle weakness (generalized)  Localized edema     Problem List Patient Active Problem List   Diagnosis Date Noted  . Osteoarthritis of left knee 10/04/2020  . History of IBS 01/21/2020  . Chronic diarrhea 10/25/2019  . Back pain 07/26/2019  . Spinal stenosis 07/26/2019  . Polymyositis ([place]) 07/26/2019  . Pain in left knee 07/21/2018  . Anxiety and depression 12/30/2015  . Impaired mobility and activities of daily living 12/30/2015  . 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 Bilateral leg weakness 01/24/2015  . Elevated CPK 01/24/2015  . Transaminitis 10/24/2014   10:31 AM, 10/09/20 M. Sherlyn Lees, PT, DPT Physical Therapist- [place] Office Number: [phone]  [place] [address] [place], [place], [address] Phone: [phone]   Fax:  [phone]  Name: Jacob Hunter MRN: [id number] Date of Birth: 02-01-1956

## 2020-10-10 DIAGNOSIS — Z79899 Other long term (current) drug therapy: Secondary | ICD-10-CM | POA: Diagnosis not present

## 2020-10-10 DIAGNOSIS — G7281 Critical illness myopathy: Secondary | ICD-10-CM | POA: Diagnosis not present

## 2020-10-11 DIAGNOSIS — G7281 Critical illness myopathy: Secondary | ICD-10-CM | POA: Diagnosis not present

## 2020-10-12 ENCOUNTER — Encounter (HOSPITAL_COMMUNITY): Payer: Self-pay

## 2020-10-12 ENCOUNTER — Other Ambulatory Visit: Payer: Self-pay

## 2020-10-12 ENCOUNTER — Ambulatory Visit (HOSPITAL_COMMUNITY): Payer: PPO | Attending: Physician Assistant

## 2020-10-12 DIAGNOSIS — G8929 Other chronic pain: Secondary | ICD-10-CM | POA: Insufficient documentation

## 2020-10-12 DIAGNOSIS — M6281 Muscle weakness (generalized): Secondary | ICD-10-CM | POA: Diagnosis not present

## 2020-10-12 DIAGNOSIS — R29898 Other symptoms and signs involving the musculoskeletal system: Secondary | ICD-10-CM | POA: Diagnosis not present

## 2020-10-12 DIAGNOSIS — R600 Localized edema: Secondary | ICD-10-CM | POA: Diagnosis not present

## 2020-10-12 DIAGNOSIS — M25562 Pain in left knee: Secondary | ICD-10-CM | POA: Diagnosis not present

## 2020-10-12 NOTE — Patient Instructions (Signed)
Toe / Heel Raise (Standing)    Standing with support, raise heels, then rock back on heels and raise toes. Repeat _10_ times.  Copyright  VHI. All rights reserved.   REMEMBER HAND/STRAP OVERPRESSURE AT END OF HEEL SLIDE ON YOUR BACK.

## 2020-10-12 NOTE — Therapy (Signed)
[place], [place], [address] Phone: [phone]   Fax:  [phone]  Physical Therapy Treatment  Patient Details  Name: Jacob Hunter MRN: [id number] Date of Birth: 04-13-1956 Referring Provider (PT): Dr. Sydnee Cabal, MD   Encounter Date: 10/12/2020   PT End of Session - 10/12/20 0858    Visit Number 3    Number of Visits 18    Authorization Type [employer], no VL, no auth    PT Start Time 0848    PT Stop Time 0939    PT Time Calculation (min) 51 min    Activity Tolerance Patient limited by pain    Behavior During Therapy [facility] for tasks assessed/performed           Past Medical History:  Diagnosis Date  . Arthritis   . Knee pain   . Necrotizing myopathy   . Pneumonia   . Polymyositis [facility])     Past Surgical History:  Procedure Laterality Date  . ANKLE SURGERY Left   . COLONOSCOPY N/A 10/05/2019   Rourk: 3 colonic polyps removed, 2 were tubular adenomas, cecal polyp was sessile serrated polyp.  Advised 3-year surveillance colonoscopy.  [name] EYE SURGERY     bil cataracts  . KNEE SURGERY Left    arthroscopy x 2  . left wrist surgery    . POLYPECTOMY  10/05/2019   Procedure: POLYPECTOMY;  Surgeon: Daneil Dolin, MD;  [facility];  Service: Endoscopy;;  . TOTAL KNEE ARTHROPLASTY Left 10/04/2020   Procedure: TOTAL KNEE ARTHROPLASTY;  Surgeon: Sydnee Cabal, MD;  [facility];  Service: Orthopedics;  Laterality: Left;  adductor canal    There were no vitals filed for this visit.   Subjective Assessment - 10/12/20 0854    Subjective Has been sleeping with Polar Pack on knee in 10 minute cold cycles. Pain primarily along incision line. Has been wearing compression sock to knee all day but not at night.    Currently in Pain? Yes    Pain Score 7     Pain Location Knee    Pain Orientation Left;Anterior    Pain Type Surgical pain            [facility] Adult PT  Treatment/Exercise - 10/12/20 0001      Knee/Hip Exercises: Aerobic   Recumbent Bike 6 min for AAROM Left knee   Seat 20     Knee/Hip Exercises: Standing   Heel Raises Both;10 reps;1 set    Other Standing Knee Exercises knee flexion drive LLE 1O84 on 12" step    Other Standing Knee Exercises 10 sec holds      Knee/Hip Exercises: Seated   Long Arc Quad AROM;Left;10 reps;1 set    Heel Slides AROM;Strengthening;Both;1 set;10 reps    Heel Slides Limitations 10 sec hold      Knee/Hip Exercises: Supine   Quad Sets Strengthening;Left;2 sets;10 reps    Quad Sets Limitations 5 sec holds    Short Arc [employer] Strengthening;AROM;Left;1 set;10 reps    Short Arc Quad Sets Limitations 5 sec hold    Heel Slides AAROM;Left;10 reps;1 set    Heel Slides Limitations 10 sec holds    Knee Extension AROM;Left    Knee Extension Limitations 8   lacking; was 10   Knee Flexion AAROM;Left    Knee Flexion Limitations 91   was 84     Manual Therapy   Manual Therapy Soft tissue mobilization  Manual therapy comments completed separate from all other skilled interventions    Soft tissue mobilization edema management supine on wedge             PT Education - 10/12/20 0857    Education Details Discussed purpose and technique of interventions throughout session. Advanced HEP.    Person(s) Educated Patient    Methods Explanation    Comprehension Verbalized understanding            PT Short Term Goals - 10/12/20 1046      PT SHORT TERM GOAL #1   Title Patient will report at least 25% improvement in symptoms for improved quality of life.    Time 4    Period Weeks    Status On-going    Target Date 11/03/20      PT SHORT TERM GOAL #2   Title Patient will demonstrate left knee flexion to 90 degrees and 5 degrees lacking extension to improve gait mechanics    Baseline 15 degrees lacking extension, 75 degrees flexion    Time 4    Period Weeks    Status On-going    Target Date 11/03/20       PT SHORT TERM GOAL #3   Title Demo improved ambulation as evident by distance of 150 ft during 2MWT    Baseline 70 ft with RW    Time 4    Period Weeks    Status On-going    Target Date 11/03/20             PT Long Term Goals - 10/12/20 1046      PT LONG TERM GOAL #1   Title Patient will be independent with HEP in order to improve functional outcomes.    Time 6    Period Weeks    Status On-going      PT LONG TERM GOAL #2   Title Patient will improve on FOTO score to meet predicted outcomes to improve functional independence    Baseline 26.4% function    Time 6    Period Weeks    Status On-going      PT LONG TERM GOAL #3   Title Demo 110 degrees left knee flexion and full extension in order to facilitate normalized gait pattern and transfers    Time 6    Period Weeks    Status On-going            Plan - 10/12/20 1039    Clinical Impression Statement Session focused on left knee ROM, quad/ham strength and edema control. Added supine SAQ and seated heel slides to therapeutic exercises. Increased knee drives to 12" step. Added standing heel/toe raises to HEP. Performed manual soft tissue mobilizations to left leg in supine with legs on wedge for edema management. AROM of left knee lacking 10 degrees extension to 91 degrees flexion; was lacking 10 to 84 last session. Patient performed  revolutions on recumbent bike at seat 20 at end of session for continue ROM performance. Patient had no increase in pain at end of session.    Personal Factors and Comorbidities Comorbidity 1    Comorbidities PMH    Examination-Activity Limitations Bed Mobility;Bend;Lift;Toileting;Stand;Stairs;Squat;Locomotion Level;Transfers    Examination-Participation Restrictions Cleaning;Community Activity;Interpersonal Relationship;Yard Work;Shop;Driving    Stability/Clinical Decision Making Stable/Uncomplicated    Rehab Potential Excellent    PT Frequency 3x / week    PT Duration 6 weeks    PT  Treatment/Interventions ADLs/Self Care Home Management;Aquatic Therapy;Cryotherapy;Electrical Stimulation;DME Instruction;Moist Heat;Gait training;Stair training;Functional  mobility training;Therapeutic activities;Therapeutic exercise;Balance training;Patient/family education;Neuromuscular re-education;Wheelchair mobility training;Manual techniques;Passive range of motion;Taping;Energy conservation;Spinal Manipulations;Joint Manipulations    PT Next Visit Plan Continue with TKR rehab protocol    PT Home Exercise Plan QS, heel slides, knee flexion drive on 6" stair; stand heel/toe raise    Consulted and Agree with Plan of Care Patient           Patient will benefit from skilled therapeutic intervention in order to improve the following deficits and impairments:  Abnormal gait,Decreased activity tolerance,Decreased balance,Decreased mobility,Decreased endurance,Decreased range of motion,Decreased strength,Increased edema,Difficulty walking,Impaired perceived functional ability,Improper body mechanics,Pain  Visit Diagnosis: Chronic pain of left knee  Muscle weakness (generalized)  Localized edema  Other symptoms and signs involving the musculoskeletal system     Problem List Patient Active Problem List   Diagnosis Date Noted  . Osteoarthritis of left knee 10/04/2020  . History of IBS 01/21/2020  . Chronic diarrhea 10/25/2019  . Back pain 07/26/2019  . Spinal stenosis 07/26/2019  . Polymyositis ([place]) 07/26/2019  . Pain in left knee 07/21/2018  . Anxiety and depression 12/30/2015  . Impaired mobility and activities of daily living 12/30/2015  . 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 Bilateral leg weakness 01/24/2015  . Elevated CPK 01/24/2015  . Transaminitis 10/24/2014    Floria Raveling. Hartnett-Rands, MS, PT Per [place] [phone]  Jeannie Done 10/12/2020, 10:47 AM  [place] [address] [place],  [place], [address] Phone: [phone]   Fax:  [phone]  Name: Jacob Hunter MRN: [id number] Date of Birth: 30-Aug-1955

## 2020-10-13 ENCOUNTER — Encounter (HOSPITAL_COMMUNITY): Payer: PPO

## 2020-10-13 ENCOUNTER — Ambulatory Visit (HOSPITAL_COMMUNITY): Payer: PPO

## 2020-10-13 ENCOUNTER — Encounter (HOSPITAL_COMMUNITY): Payer: Self-pay

## 2020-10-13 DIAGNOSIS — M6281 Muscle weakness (generalized): Secondary | ICD-10-CM

## 2020-10-13 DIAGNOSIS — G8929 Other chronic pain: Secondary | ICD-10-CM

## 2020-10-13 DIAGNOSIS — R29898 Other symptoms and signs involving the musculoskeletal system: Secondary | ICD-10-CM

## 2020-10-13 DIAGNOSIS — M25562 Pain in left knee: Secondary | ICD-10-CM | POA: Diagnosis not present

## 2020-10-13 DIAGNOSIS — R600 Localized edema: Secondary | ICD-10-CM

## 2020-10-13 NOTE — Therapy (Addendum)
[place], [place], [address] Phone: [phone]   Fax:  [phone]  Physical Therapy Treatment  Patient Details  Name: JARVIN OGREN MRN: [id number] Date of Birth: 1955/12/04 Referring Provider (PT): Dr. Sydnee Cabal, MD   Encounter Date: 10/13/2020   PT End of Session - 10/13/20 1409    Visit Number 4    Number of Visits 18    Authorization Type [employer], no VL, no auth    Progress Note Due on Visit 10    PT Start Time 1402    PT Stop Time 1451    PT Time Calculation (min) 49 min    Activity Tolerance Patient tolerated treatment well;Patient limited by pain    Behavior During Therapy [facility] for tasks assessed/performed           Past Medical History:  Diagnosis Date  . Arthritis   . Knee pain   . Necrotizing myopathy   . Pneumonia   . Polymyositis [facility])     Past Surgical History:  Procedure Laterality Date  . ANKLE SURGERY Left   . COLONOSCOPY N/A 10/05/2019   Rourk: 3 colonic polyps removed, 2 were tubular adenomas, cecal polyp was sessile serrated polyp.  Advised 3-year surveillance colonoscopy.  [name] EYE SURGERY     bil cataracts  . KNEE SURGERY Left    arthroscopy x 2  . left wrist surgery    . POLYPECTOMY  10/05/2019   Procedure: POLYPECTOMY;  Surgeon: Daneil Dolin, MD;  [facility];  Service: Endoscopy;;  . TOTAL KNEE ARTHROPLASTY Left 10/04/2020   Procedure: TOTAL KNEE ARTHROPLASTY;  Surgeon: Sydnee Cabal, MD;  Location: [facility];  Service: Orthopedics;  Laterality: Left;  adductor canal    There were no vitals filed for this visit.   Subjective Assessment - 10/13/20 1405    Subjective Pt stated he had difficulty sleeping last night, did some exercises, ice and elevation.  Had to move to recliner to get more comfortable.    Currently in Pain? Yes    Pain Score 6     Pain Location Knee    Pain Orientation Left    Pain Descriptors / Indicators  Aching;Tightness;Sore    Pain Type Surgical pain    Pain Onset 1 to 4 weeks ago    Pain Frequency Intermittent    Pain Relieving Factors ice, elevation, get up and walk every hour    Effect of Pain on Daily Activities Limits                             [facility] Adult PT Treatment/Exercise - 10/13/20 0001      Exercises   Exercises Knee/Hip      Knee/Hip Exercises: Stretches   Active Hamstring Stretch 5 reps;20 seconds    Active Hamstring Stretch Limitations standing 12in step alternating between knee drive and hamstring stretch (flexion to extension)    Knee: Self-Stretch to increase Flexion Left;5 reps;10 seconds    Gastroc Stretch 3 reps;30 seconds    Gastroc Stretch Limitations slant board      Knee/Hip Exercises: Aerobic   Recumbent Bike Rocking for mobility, seat 20; 59min      Knee/Hip Exercises: Standing   Heel Raises Both;10 reps;1 set    Heel Raises Limitations toe raises on slope    Rocker Board 2 minutes    Rocker Board Limitations lateral  Knee/Hip Exercises: Seated   Long Arc Quad AROM;Left;10 reps;1 set      Knee/Hip Exercises: Supine   Quad Sets Strengthening;Left;2 sets;10 reps    [employer] Limitations 5" holds    Short Arc [employer];Left;1 set;10 reps    Heel Slides AAROM;Left;10 reps;1 set    Heel Slides Limitations 10 sec holds    Knee Extension AROM;Left    Knee Extension Limitations 8    Knee Flexion AROM    Knee Flexion Limitations 96      Knee/Hip Exercises: Prone   Prone Knee Hang 1 minute   2 sets     Manual Therapy   Manual Therapy Edema management    Manual therapy comments completed separate from all other skilled interventions    Edema Management retrograde extension with LE elevated            A: Session focus with knee mobility.  Added TKE exercises to address extension lag.  Manual retrograde massage complete to address edema proximal knee for mobility and pain control.  Improved flexion wiht  AROM 8-96 degrees. P:  Continue knee mobility exercises and strengthening as appropriate.          PT Short Term Goals - 10/12/20 1046      PT SHORT TERM GOAL #1   Title Patient will report at least 25% improvement in symptoms for improved quality of life.    Time 4    Period Weeks    Status On-going    Target Date 11/03/20      PT SHORT TERM GOAL #2   Title Patient will demonstrate left knee flexion to 90 degrees and 5 degrees lacking extension to improve gait mechanics    Baseline 15 degrees lacking extension, 75 degrees flexion    Time 4    Period Weeks    Status On-going    Target Date 11/03/20      PT SHORT TERM GOAL #3   Title Demo improved ambulation as evident by distance of 150 ft during 2MWT    Baseline 70 ft with RW    Time 4    Period Weeks    Status On-going    Target Date 11/03/20             PT Long Term Goals - 10/12/20 1046      PT LONG TERM GOAL #1   Title Patient will be independent with HEP in order to improve functional outcomes.    Time 6    Period Weeks    Status On-going      PT LONG TERM GOAL #2   Title Patient will improve on FOTO score to meet predicted outcomes to improve functional independence    Baseline 26.4% function    Time 6    Period Weeks    Status On-going      PT LONG TERM GOAL #3   Title Demo 110 degrees left knee flexion and full extension in order to facilitate normalized gait pattern and transfers    Time 6    Period Weeks    Status On-going                  Patient will benefit from skilled therapeutic intervention in order to improve the following deficits and impairments:     Visit Diagnosis: Chronic pain of left knee  Muscle weakness (generalized)  Localized edema  Other symptoms and signs involving the musculoskeletal system     Problem List Patient  Active Problem List   Diagnosis Date Noted  . Osteoarthritis of left knee 10/04/2020  . History of IBS 01/21/2020  . Chronic diarrhea  10/25/2019  . Back pain 07/26/2019  . Spinal stenosis 07/26/2019  . Polymyositis ([place]) 07/26/2019  . Pain in left knee 07/21/2018  . Anxiety and depression 12/30/2015  . Impaired mobility and activities of daily living 12/30/2015  . 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 Bilateral leg weakness 01/24/2015  . Elevated CPK 01/24/2015  . Transaminitis 10/24/2014   Ihor Austin, LPTA/CLT; CBIS [phone]  Aldona Lento 10/13/2020, 3:34 PM  [facility] [address] [place], [place], [address] Phone: [phone]   Fax:  [phone]  Name: NEVADA MULLETT MRN: [id number] Date of Birth: [birth year]

## 2020-10-16 ENCOUNTER — Ambulatory Visit (HOSPITAL_COMMUNITY): Payer: PPO

## 2020-10-16 ENCOUNTER — Encounter (HOSPITAL_COMMUNITY): Payer: Self-pay

## 2020-10-16 ENCOUNTER — Other Ambulatory Visit: Payer: Self-pay

## 2020-10-16 DIAGNOSIS — R600 Localized edema: Secondary | ICD-10-CM

## 2020-10-16 DIAGNOSIS — G8929 Other chronic pain: Secondary | ICD-10-CM

## 2020-10-16 DIAGNOSIS — M25562 Pain in left knee: Secondary | ICD-10-CM | POA: Diagnosis not present

## 2020-10-16 DIAGNOSIS — R29898 Other symptoms and signs involving the musculoskeletal system: Secondary | ICD-10-CM

## 2020-10-16 DIAGNOSIS — M6281 Muscle weakness (generalized): Secondary | ICD-10-CM

## 2020-10-16 NOTE — Patient Instructions (Signed)
KNEE: Flexion / Extension - Sitting    Sit at edge of surface, foot on towel or pillowcase. Bend and straighten knee. Keep foot in line with hip. 10___ reps per set, _2-3__ sets per day, ___ days per week. Use opposite leg to increase knee flexion.  Copyright  VHI. All rights reserved.   KNEE: Extension, Long Arc Quads - Sitting    Raise leg until knee is straight. 10_ reps per set, 1-2___ sets per day.  Copyright  VHI. All rights reserved.

## 2020-10-16 NOTE — Therapy (Signed)
[place], [place], [address] Phone: [phone]   Fax:  [phone]  Physical Therapy Treatment  Patient Details  Name: Jacob Hunter MRN: [id number] Date of Birth: March 05, 1956 Referring Provider (PT): Dr. Sydnee Cabal, MD   Encounter Date: 10/16/2020   PT End of Session - 10/16/20 0858    Visit Number 5    Number of Visits 18    Authorization Type [employer], no VL, no auth    Progress Note Due on Visit 10    PT Start Time 0846    PT Stop Time 0940    PT Time Calculation (min) 54 min    Activity Tolerance Patient tolerated treatment well;Patient limited by pain    Behavior During Therapy [facility] for tasks assessed/performed           Past Medical History:  Diagnosis Date  . Arthritis   . Knee pain   . Necrotizing myopathy   . Pneumonia   . Polymyositis [facility])     Past Surgical History:  Procedure Laterality Date  . ANKLE SURGERY Left   . COLONOSCOPY N/A 10/05/2019   Rourk: 3 colonic polyps removed, 2 were tubular adenomas, cecal polyp was sessile serrated polyp.  Advised 3-year surveillance colonoscopy.  [name] EYE SURGERY     bil cataracts  . KNEE SURGERY Left    arthroscopy x 2  . left wrist surgery    . POLYPECTOMY  10/05/2019   Procedure: POLYPECTOMY;  Surgeon: Daneil Dolin, MD;  [facility];  Service: Endoscopy;;  . TOTAL KNEE ARTHROPLASTY Left 10/04/2020   Procedure: TOTAL KNEE ARTHROPLASTY;  Surgeon: Sydnee Cabal, MD;  Location: [facility];  Service: Orthopedics;  Laterality: Left;  adductor canal    There were no vitals filed for this visit.   Subjective Assessment - 10/16/20 0852    Subjective Had to switch to the recliner and take a pain pill part way through the night. 5/10 pain at night. Tylenol at 5:45 am today. Had left knee on ice machine all night. Feels the swelling his improved and he thinks he can get his knee a bit straighter since last week. Sees MD  Wednesday and gets staples out and bandage off.    Currently in Pain? Yes    Pain Score 5     Pain Location Knee    Pain Orientation Left;Anterior   below knee cap at distal end of bandage.   Pain Onset 1 to 4 weeks ago             [facility] Adult PT Treatment/Exercise - 10/16/20 0001      Exercises   Exercises Knee/Hip      Knee/Hip Exercises: Stretches   Active Hamstring Stretch 5 reps;20 seconds    Active Hamstring Stretch Limitations standing 12in step alternating between knee drive and hamstring stretch (flexion to extension)    Knee: Self-Stretch to increase Flexion Left;5 reps;10 seconds    Gastroc Stretch 3 reps;30 seconds    Gastroc Stretch Limitations slant board      Knee/Hip Exercises: Aerobic   Recumbent Bike Rocking for mobility, seat 20; 14min      Knee/Hip Exercises: Standing   Heel Raises Both;10 reps;1 set   on slope   Heel Raises Limitations toe raises on slope    Rocker Board 2 minutes    Rocker Board Limitations fwd/bwd, lateral      Knee/Hip Exercises: Seated   Long Arc Quad AROM;Left;10 reps;1  set    [employer] Limitations 3 sec hold    Sit to [employer] 1 set;with UE support   x5     Knee/Hip Exercises: Supine   Knee Extension AROM;Left    Knee Extension Limitations 7    Knee Flexion AROM    Knee Flexion Limitations 97      Manual Therapy   Manual Therapy Edema management    Manual therapy comments completed separate from all other skilled interventions    Edema Management retrograde extension with LE elevated             PT Education - 10/16/20 0857    Education Details Discussed purpose and technique of interventions throughout session. Advanced HEP.    Person(s) Educated Patient    Methods Explanation;Handout    Comprehension Verbalized understanding            PT Short Term Goals - 10/12/20 1046      PT SHORT TERM GOAL #1   Title Patient will report at least 25% improvement in symptoms for improved quality of life.    Time 4     Period Weeks    Status On-going    Target Date 11/03/20      PT SHORT TERM GOAL #2   Title Patient will demonstrate left knee flexion to 90 degrees and 5 degrees lacking extension to improve gait mechanics    Baseline 15 degrees lacking extension, 75 degrees flexion    Time 4    Period Weeks    Status On-going    Target Date 11/03/20      PT SHORT TERM GOAL #3   Title Demo improved ambulation as evident by distance of 150 ft during 2MWT    Baseline 70 ft with RW    Time 4    Period Weeks    Status On-going    Target Date 11/03/20             PT Long Term Goals - 10/12/20 1046      PT LONG TERM GOAL #1   Title Patient will be independent with HEP in order to improve functional outcomes.    Time 6    Period Weeks    Status On-going      PT LONG TERM GOAL #2   Title Patient will improve on FOTO score to meet predicted outcomes to improve functional independence    Baseline 26.4% function    Time 6    Period Weeks    Status On-going      PT LONG TERM GOAL #3   Title Demo 110 degrees left knee flexion and full extension in order to facilitate normalized gait pattern and transfers    Time 6    Period Weeks    Status On-going             Plan - 10/16/20 0948    Clinical Impression Statement Session focused on left knee ROM, quad/ham strength and edema control. Patient performed  revolutions on recumbent bike at seat 20 at beginning of session for continue ROM performance. Added rockerboard anterior/posterior. Added seated heel slides and LAQ to HEP. Patient experience shortness of breath and complaints of dizziness with associated palor when performing rockerboard exercise. Patient encouraged to sit and rest and perform breathing exercises. Patient able to recover and normal color returned after sitting for 3 minutes. Performed manual soft tissue mobilizations to left leg in supine with legs on wedge for edema management. AROM of left knee  lacking 7 degrees extension to  97 degrees flexion; was lacking 8 to 96 last session. Patient had no increase in pain at end of session stating he would be icing his knee immediately after session.    Personal Factors and Comorbidities Comorbidity 1    Comorbidities PMH    Examination-Activity Limitations Bed Mobility;Bend;Lift;Toileting;Stand;Stairs;Squat;Locomotion Level;Transfers    Examination-Participation Restrictions Cleaning;Community Activity;Interpersonal Relationship;Yard Work;Shop;Driving    Stability/Clinical Decision Making Stable/Uncomplicated    Rehab Potential Excellent    PT Frequency 3x / week    PT Duration 6 weeks    PT Treatment/Interventions ADLs/Self Care Home Management;Aquatic Therapy;Cryotherapy;Electrical Stimulation;DME Instruction;Moist Heat;Gait training;Stair training;Functional mobility training;Therapeutic activities;Therapeutic exercise;Balance training;Patient/family education;Neuromuscular re-education;Wheelchair mobility training;Manual techniques;Passive range of motion;Taping;Energy conservation;Spinal Manipulations;Joint Manipulations    PT Next Visit Plan Continue with TKR rehab protocol    PT Home Exercise Plan QS, heel slides, knee flexion drive on 6" stair; stand heel/toe raise; 10/16/20 - LAQ, seated heel slide    Consulted and Agree with Plan of Care Patient           Patient will benefit from skilled therapeutic intervention in order to improve the following deficits and impairments:  Abnormal gait,Decreased activity tolerance,Decreased balance,Decreased mobility,Decreased endurance,Decreased range of motion,Decreased strength,Increased edema,Difficulty walking,Impaired perceived functional ability,Improper body mechanics,Pain  Visit Diagnosis: Chronic pain of left knee  Muscle weakness (generalized)  Localized edema  Other symptoms and signs involving the musculoskeletal system     Problem List Patient Active Problem List   Diagnosis Date Noted  . Osteoarthritis of  left knee 10/04/2020  . History of IBS 01/21/2020  . Chronic diarrhea 10/25/2019  . Back pain 07/26/2019  . Spinal stenosis 07/26/2019  . Polymyositis ([place]) 07/26/2019  . Pain in left knee 07/21/2018  . Anxiety and depression 12/30/2015  . Impaired mobility and activities of daily living 12/30/2015  . 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 Bilateral leg weakness 01/24/2015  . Elevated CPK 01/24/2015  . Transaminitis 10/24/2014   Floria Raveling. Hartnett-Rands, MS, PT Per [place] [phone]  Jeannie Done 10/16/2020, 9:49 AM  [facility] [address] [place], [place], [address] Phone: [phone]   Fax:  [phone]  Name: Jacob Hunter MRN: [id number] Date of Birth: 10-05-1955

## 2020-10-18 DIAGNOSIS — Z96652 Presence of left artificial knee joint: Secondary | ICD-10-CM | POA: Diagnosis not present

## 2020-10-18 DIAGNOSIS — Z471 Aftercare following joint replacement surgery: Secondary | ICD-10-CM | POA: Diagnosis not present

## 2020-10-19 ENCOUNTER — Ambulatory Visit (HOSPITAL_COMMUNITY): Payer: PPO

## 2020-10-19 ENCOUNTER — Other Ambulatory Visit: Payer: Self-pay

## 2020-10-19 ENCOUNTER — Telehealth (HOSPITAL_COMMUNITY): Payer: Self-pay | Admitting: Physical Therapy

## 2020-10-19 ENCOUNTER — Emergency Department (HOSPITAL_COMMUNITY)
Admission: EM | Admit: 2020-10-19 | Discharge: 2020-10-19 | Disposition: A | Discharge status: Home / Self Care | Payer: PPO | Attending: Emergency Medicine | Admitting: Emergency Medicine

## 2020-10-19 ENCOUNTER — Emergency Department (HOSPITAL_COMMUNITY): Payer: PPO

## 2020-10-19 ENCOUNTER — Encounter (HOSPITAL_COMMUNITY): Payer: Self-pay | Admitting: Emergency Medicine

## 2020-10-19 DIAGNOSIS — R1110 Vomiting, unspecified: Secondary | ICD-10-CM | POA: Insufficient documentation

## 2020-10-19 DIAGNOSIS — G8929 Other chronic pain: Secondary | ICD-10-CM

## 2020-10-19 DIAGNOSIS — R4020 Unspecified coma: Secondary | ICD-10-CM | POA: Diagnosis not present

## 2020-10-19 DIAGNOSIS — M25562 Pain in left knee: Secondary | ICD-10-CM | POA: Diagnosis not present

## 2020-10-19 DIAGNOSIS — R42 Dizziness and giddiness: Secondary | ICD-10-CM | POA: Insufficient documentation

## 2020-10-19 DIAGNOSIS — Z743 Need for continuous supervision: Secondary | ICD-10-CM | POA: Diagnosis not present

## 2020-10-19 DIAGNOSIS — J9811 Atelectasis: Secondary | ICD-10-CM | POA: Diagnosis not present

## 2020-10-19 DIAGNOSIS — Z96652 Presence of left artificial knee joint: Secondary | ICD-10-CM | POA: Insufficient documentation

## 2020-10-19 DIAGNOSIS — R55 Syncope and collapse: Secondary | ICD-10-CM | POA: Insufficient documentation

## 2020-10-19 DIAGNOSIS — R29898 Other symptoms and signs involving the musculoskeletal system: Secondary | ICD-10-CM

## 2020-10-19 DIAGNOSIS — R0689 Other abnormalities of breathing: Secondary | ICD-10-CM | POA: Diagnosis not present

## 2020-10-19 DIAGNOSIS — M6281 Muscle weakness (generalized): Secondary | ICD-10-CM

## 2020-10-19 DIAGNOSIS — Z7982 Long term (current) use of aspirin: Secondary | ICD-10-CM | POA: Diagnosis not present

## 2020-10-19 DIAGNOSIS — R600 Localized edema: Secondary | ICD-10-CM

## 2020-10-19 DIAGNOSIS — R404 Transient alteration of awareness: Secondary | ICD-10-CM | POA: Diagnosis not present

## 2020-10-19 LAB — COMPREHENSIVE METABOLIC PANEL
ALT: 24 U/L (ref 0–44)
AST: 33 U/L (ref 15–41)
Albumin: 3.3 g/dL — ABNORMAL LOW (ref 3.5–5.0)
Alkaline Phosphatase: 59 U/L (ref 38–126)
Anion gap: 13 (ref 5–15)
BUN: 22 mg/dL (ref 8–23)
CO2: 22 mmol/L (ref 22–32)
Calcium: 9.3 mg/dL (ref 8.9–10.3)
Chloride: 103 mmol/L (ref 98–111)
Creatinine, Ser: 1.03 mg/dL (ref 0.61–1.24)
GFR, Estimated: >60 mL/min (ref >60)
Glucose, Bld: 135 mg/dL — ABNORMAL HIGH (ref 70–99)
Potassium: 3.7 mmol/L (ref 3.5–5.1)
Sodium: 138 mmol/L (ref 135–145)
Total Bilirubin: 0.6 mg/dL (ref 0.3–1.2)
Total Protein: 7.6 g/dL (ref 6.5–8.1)

## 2020-10-19 LAB — CBC WITH DIFFERENTIAL/PLATELET
Abs Immature Granulocytes: 0.06 10*3/uL (ref 0.00–0.07)
Basophils Absolute: 0.1 10*3/uL (ref 0.0–0.1)
Basophils Relative: 1 %
Eosinophils Absolute: 0.1 10*3/uL (ref 0.0–0.5)
Eosinophils Relative: 1 %
HCT: 32.5 % — ABNORMAL LOW (ref 39.0–52.0)
Hemoglobin: 10.1 g/dL — ABNORMAL LOW (ref 13.0–17.0)
Immature Granulocytes: 1 %
Lymphocytes Relative: 5 %
Lymphs Abs: 0.5 10*3/uL — ABNORMAL LOW (ref 0.7–4.0)
MCH: 30.8 pg (ref 26.0–34.0)
MCHC: 31.1 g/dL (ref 30.0–36.0)
MCV: 99.1 fL (ref 80.0–100.0)
Monocytes Absolute: 0.7 10*3/uL (ref 0.1–1.0)
Monocytes Relative: 8 %
Neutro Abs: 7.6 10*3/uL (ref 1.7–7.7)
Neutrophils Relative %: 84 %
Platelets: 316 10*3/uL (ref 150–400)
RBC: 3.28 MIL/uL — ABNORMAL LOW (ref 4.22–5.81)
RDW: 17.2 % — ABNORMAL HIGH (ref 11.5–15.5)
WBC: 9.1 10*3/uL (ref 4.0–10.5)
nRBC: 0 % (ref 0.0–0.2)

## 2020-10-19 LAB — D-DIMER, QUANTITATIVE: D-Dimer, Quant: 7.61 ug/mL-FEU — ABNORMAL HIGH (ref 0.00–0.50)

## 2020-10-19 LAB — CBG MONITORING, ED: Glucose-Capillary: 88 mg/dL (ref 70–99)

## 2020-10-19 MED ORDER — IOHEXOL 350 MG/ML SOLN
100.0000 mL | Freq: Once | INTRAVENOUS | Status: AC | PRN
  Administered 2020-10-19: 100 mL via INTRAVENOUS

## 2020-10-19 MED ORDER — SODIUM CHLORIDE 0.9 % IV SOLN
INTRAVENOUS | Status: DC
Start: 2020-10-19 — End: 2020-10-19

## 2020-10-19 MED ORDER — OXYCODONE-ACETAMINOPHEN 5-325 MG PO TABS
1.0000 | ORAL_TABLET | Freq: Once | ORAL | Status: AC
  Administered 2020-10-19: 1 via ORAL
  Filled 2020-10-19: qty 1

## 2020-10-19 MED ORDER — SODIUM CHLORIDE 0.9 % IV BOLUS
1000.0000 mL | Freq: Once | INTRAVENOUS | Status: AC
  Administered 2020-10-19: 1000 mL via INTRAVENOUS

## 2020-10-19 NOTE — ED Triage Notes (Signed)
Pt was having PT for recent left knee surgery.  Pt became lightheaded, sat down.  EMS arrived was told pt LOC, was responsible to painful stimuli.   Pt a/o x 4 at this time

## 2020-10-19 NOTE — ED Provider Notes (Signed)
[facility] EMERGENCY DEPARTMENT Provider Note   CSN: [id number] Arrival date & time: 10/19/20  5284     History Chief Complaint  Patient presents with  . Near Syncope    Jacob Hunter is a 65 y.o. male with pertinent past medical history of spinal stenosis, anxiety, depression, with recent total knee replacement on the left side in February that presents the emergency department today for syncope.  Patient states that he was at PT for his knee, states that he became lightheaded and sat down, started going to the bathroom and then he became lightheaded again, sat down and vomited and then syncopized.  He states that he then does not remember what happened, no note of patient hitting the floor.  Patient states that he feels fine now, feels slightly sleepy.  Patient states that this happened to him at the beginning of this week as well, however did not pass out.  States this is the first time this is happened.  Denies any chest pain or palpitations prior to the event or currently.  Denies any shortness of breath.  Denies abdominal pain or back pain.  Denies any fevers or chills.  Denies any recent illness.  States that he did eat a full breakfast this morning.  Patient is on oxycodone and Robaxin for knee surgery. Denies any bloody stool or melena.   HPI     Past Medical History:  Diagnosis Date  . Arthritis   . Knee pain   . Necrotizing myopathy   . Pneumonia   . Polymyositis [facility])     Patient Active Problem List   Diagnosis Date Noted  . Osteoarthritis of left knee 10/04/2020  . History of IBS 01/21/2020  . Chronic diarrhea 10/25/2019  . Back pain 07/26/2019  . Spinal stenosis 07/26/2019  . Polymyositis ([place]) 07/26/2019  . Pain in left knee 07/21/2018  . Anxiety and depression 12/30/2015  . Impaired mobility and activities of daily living 12/30/2015  . 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 Bilateral leg weakness 01/24/2015  . Elevated CPK 01/24/2015  . Transaminitis  10/24/2014    Past Surgical History:  Procedure Laterality Date  . ANKLE SURGERY Left   . COLONOSCOPY N/A 10/05/2019   Rourk: 3 colonic polyps removed, 2 were tubular adenomas, cecal polyp was sessile serrated polyp.  Advised 3-year surveillance colonoscopy.  [name] EYE SURGERY     bil cataracts  . KNEE SURGERY Left    arthroscopy x 2  . left wrist surgery    . POLYPECTOMY  10/05/2019   Procedure: POLYPECTOMY;  Surgeon: Daneil Dolin, MD;  [facility];  Service: Endoscopy;;  . TOTAL KNEE ARTHROPLASTY Left 10/04/2020   Procedure: TOTAL KNEE ARTHROPLASTY;  Surgeon: Sydnee Cabal, MD;  Location: [facility] ORS;  Service: Orthopedics;  Laterality: Left;  adductor canal       Family History  Problem Relation Age of Onset  . Hypertension Father   . Hypercholesterolemia Father   . Hypercholesterolemia Sister   . Hypertension Mother   . Breast cancer Sister   . Colon cancer Neg Hx   . Liver disease Neg Hx     Social History   Tobacco Use  . Smoking status: Never Smoker  . Smokeless tobacco: Never Used  Vaping Use  . Vaping Use: Never used  Substance Use Topics  . Alcohol use: No    Alcohol/week: 0.0 standard drinks    Comment: Last ETOH was 1-2 drinks in 1990.  . Drug use: No  Home Medications Prior to Admission medications   Medication Sig Start Date End Date Taking? Authorizing Provider  acetaminophen (TYLENOL) 500 MG tablet Take 1,000 mg by mouth every 6 (six) hours as needed for moderate pain.   Yes [provider]  ascorbic acid (VITAMIN C) 1000 MG tablet Take 1,000 mg by mouth daily.   Yes [provider]  aspirin 81 MG EC tablet Take 81 mg by mouth daily. Swallow whole.   Yes [provider]  calcium carbonate (TUMS - DOSED IN MG ELEMENTAL CALCIUM) 500 MG chewable tablet Chew 1 tablet by mouth daily as needed for heartburn.   Yes [provider]  calcium-vitamin D (OSCAL WITH D) 500-200 MG-UNIT tablet Take 1 tablet by mouth  daily with breakfast. 07/30/19  Yes Emokpae, Courage, MD  cholecalciferol (VITAMIN D3) 25 MCG (1000 UNIT) tablet Take 1,000 Units by mouth daily.   Yes [provider]  Coenzyme Q10 (CO Q 10) 100 MG CAPS Take 100 mg by mouth daily.   Yes [provider]  FLUoxetine (PROZAC) 20 MG capsule Take 20 mg by mouth daily.   Yes [provider]  folic acid (FOLVITE) 1 MG tablet Take 1 tablet (1 mg total) by mouth daily. 07/29/19  Yes Emokpae, Courage, MD  methocarbamol (ROBAXIN) 500 MG tablet Take 1 tablet (500 mg total) by mouth 4 (four) times daily. 10/05/20  Yes Haus, Margarita Mail R, PA  methotrexate (RHEUMATREX) 2.5 MG tablet Take 25 mg by mouth every Monday.    Yes [provider]  oxyCODONE (OXY IR/ROXICODONE) 5 MG immediate release tablet Take 5 mg by mouth every 4 (four) hours as needed for pain.   Yes [provider]  predniSONE (DELTASONE) 10 MG tablet Take 10 mg by mouth daily with breakfast.   Yes [provider]  PRESCRIPTION MEDICATION IVIG Antibodies 2 visits every 4 weeks   Yes [provider]  riTUXimab (RITUXAN) 100 MG/10ML injection Inject 1,000 mg into the vein See admin instructions. 2 times a month in March and September   Yes [provider]  vitamin B-12 (CYANOCOBALAMIN) 1000 MCG tablet Take 1,000 mcg by mouth daily.   Yes [provider]  ondansetron (ZOFRAN) 4 MG tablet Take 1 tablet (4 mg total) by mouth daily as needed for nausea or vomiting. Patient not taking: No sig reported 10/05/20 10/05/21  Elizabeth Sauer R, PA    Allergies    Methylprednisolone, Demerol [meperidine], and Statins  Review of Systems   Review of Systems  Constitutional: Negative for chills, diaphoresis, fatigue and fever.  HENT: Negative for congestion, sore throat and trouble swallowing.   Eyes: Negative for pain and visual disturbance.  Respiratory: Negative for cough, shortness of breath and wheezing.   Cardiovascular: Negative  for chest pain, palpitations and leg swelling.  Gastrointestinal: Negative for abdominal distention, abdominal pain, diarrhea, nausea and vomiting.  Genitourinary: Negative for difficulty urinating.  Musculoskeletal: Negative for back pain, neck pain and neck stiffness.  Skin: Negative for pallor.  Neurological: Positive for facial asymmetry. Negative for dizziness, seizures, syncope, speech difficulty, weakness, light-headedness, numbness and headaches.  Psychiatric/Behavioral: Negative for confusion.    Physical Exam Updated Vital Signs BP 130/75   Pulse 76   Temp 98 F (36.7 C)   Resp 15   Ht 6\' 1"  (1.854 m)   Wt 99.8 kg   SpO2 99%   BMI 29.03 kg/m   Physical Exam Constitutional:      General: He is not in acute  distress.    Appearance: Normal appearance. He is not ill-appearing, toxic-appearing or diaphoretic.  HENT:     Mouth/Throat:     Mouth: Mucous membranes are moist.     Pharynx: Oropharynx is clear.  Eyes:     General: No scleral icterus.    Extraocular Movements: Extraocular movements intact.     Pupils: Pupils are equal, round, and reactive to light.  Cardiovascular:     Rate and Rhythm: Normal rate and regular rhythm.     Pulses: Normal pulses.     Heart sounds: Normal heart sounds.  Pulmonary:     Effort: Pulmonary effort is normal. No respiratory distress.     Breath sounds: Normal breath sounds. No stridor. No wheezing, rhonchi or rales.  Chest:     Chest wall: No tenderness.  Abdominal:     General: Abdomen is flat. There is no distension.     Palpations: Abdomen is soft.     Tenderness: There is no abdominal tenderness. There is no guarding or rebound.  Musculoskeletal:        General: No swelling or tenderness. Normal range of motion.     Cervical back: Normal range of motion and neck supple. No rigidity.     Right lower leg: No edema.     Left lower leg: No edema.  Skin:    General: Skin is warm and dry.     Capillary Refill: Capillary refill  takes less than 2 seconds.     Coloration: Skin is not pale.  Neurological:     General: No focal deficit present.     Mental Status: He is alert and oriented to person, place, and time.     Comments: Alert. Clear speech. No facial droop. CNIII-XII grossly intact. Bilateral upper and lower extremities' sensation grossly intact. 5/5 symmetric strength with grip strength and with plantar and dorsi flexion bilaterally. Normal finger to nose bilaterally. Negative pronator drift. Negative Romberg sign. Gait is steady and intact.    Psychiatric:        Mood and Affect: Mood normal.        Behavior: Behavior normal.     ED Results / Procedures / Treatments   Labs (all labs ordered are listed, but only abnormal results are displayed) Labs Reviewed  CBC WITH DIFFERENTIAL/PLATELET - Abnormal; Notable for the following components:      Result Value   RBC 3.28 (*)    Hemoglobin 10.1 (*)    HCT 32.5 (*)    RDW 17.2 (*)    Lymphs Abs 0.5 (*)    All other components within normal limits  COMPREHENSIVE METABOLIC PANEL - Abnormal; Notable for the following components:   Glucose, Bld 135 (*)    Albumin 3.3 (*)    All other components within normal limits  D-DIMER, QUANTITATIVE - Abnormal; Notable for the following components:   D-Dimer, Quant 7.61 (*)    All other components within normal limits  CBG MONITORING, ED    EKG EKG Interpretation  Date/Time:  Thursday October 19 2020 10:02:08 EST Ventricular Rate:  84 PR Interval:    QRS Duration: 108 QT Interval:  384 QTC Calculation: 454 R Axis:   45 Text Interpretation: Sinus rhythm Confirmed by Fredia Sorrow [phone]) on 10/19/2020 11:38:15 AM   Radiology CT Angio Chest PE W/Cm &/Or Wo Cm  Result Date: 10/19/2020 CLINICAL DATA:  Concern for pulmonary embolism. EXAM: CT ANGIOGRAPHY CHEST WITH CONTRAST TECHNIQUE: Multidetector CT imaging of the chest was performed using  the standard protocol during bolus administration of intravenous  contrast. Multiplanar CT image reconstructions and MIPs were obtained to evaluate the vascular anatomy. CONTRAST:  146mL OMNIPAQUE IOHEXOL 350 MG/ML SOLN COMPARISON:  None. FINDINGS: Cardiovascular: No filling defects within the pulmonary arteries to suggest acute pulmonary embolism. Mediastinum/Nodes: No axillary or supraclavicular adenopathy. No mediastinal or hilar adenopathy. No pericardial fluid. Esophagus normal. Lungs/Pleura: No pulmonary infarction. No infiltrate. Mild basilar atelectasis. Upper Abdomen: Limited view of the liver, kidneys, pancreas are unremarkable. Normal adrenal glands. Multiple benign-appearing cysts within the liver. Musculoskeletal: No aggressive osseous lesion Review of the MIP images confirms the above findings. IMPRESSION: 1. No evidence acute pulmonary embolism. 2. No acute pulmonary parenchymal findings. Electronically Signed   By: Suzy Bouchard M.D.   On: 10/19/2020 14:52    Procedures Procedures   Medications Ordered in ED Medications  sodium chloride 0.9 % bolus 1,000 mL (0 mLs Intravenous Stopped 10/19/20 1520)    And  0.9 %  sodium chloride infusion (has no administration in time range)  oxyCODONE-acetaminophen (PERCOCET/ROXICET) 5-325 MG per tablet 1 tablet (1 tablet Oral Given 10/19/20 1248)  iohexol (OMNIPAQUE) 350 MG/ML injection 100 mL (100 mLs Intravenous Contrast Given 10/19/20 1358)    ED Course  I have reviewed the triage vital signs and the nursing notes.  Pertinent labs & imaging results that were available during my care of the patient were reviewed by me and considered in my medical decision making (see chart for details).    MDM Rules/Calculators/A&P                         KASHIS PENLEY is a 65 y.o. male with pertinent past medical history of spinal stenosis, anxiety, depression, with recent total knee replacement on the left side in February that presents the emergency department today for syncope.    CMP unremarkable, CBC with  hemoglobin of 10, baseline around 11 this is most likely from recent surgery.  D-dimer elevated to 7.61, will obtain CT angio.  CT angio without evidence of PE, cardiac monitoring here without arrhythmias, orthostatic vitals negative.  Vital signs are stable, cardiac monitoring here without any arrhythmias.  Do not think that the syncopal episode was cardiac in origin, will have patient follow-up with PCP.  I think it could be a combination of patient starting narcotics in addition to Robaxin.  Counseled patient on this.  Normal gait, normal neuro exam, patient discharged.  Doubt need for further emergent work up at this time. I explained the diagnosis and have given explicit precautions to return to the ER including for any other new or worsening symptoms. The patient understands and accepts the medical plan as it's been dictated and I have answered their questions. Discharge instructions concerning home care and prescriptions have been given. The patient is STABLE and is discharged to home in good condition.  I discussed this case with my attending physician who cosigned this note including patient's presenting symptoms, physical exam, and planned diagnostics and interventions. Attending physician stated agreement with plan or made changes to plan which were implemented.   Attending physician assessed patient at bedside.  Final Clinical Impression(s) / ED Diagnoses Final diagnoses:  Syncope and collapse    Rx / DC Orders ED Discharge Orders    None       Alfredia Client, PA-C 10/19/20 1526    Fredia Sorrow, MD 10/20/20 0730

## 2020-10-19 NOTE — Telephone Encounter (Signed)
Pt is still in ED doing well and waiting on test results - could go home or be admitted.

## 2020-10-19 NOTE — ED Provider Notes (Incomplete)
[facility] EMERGENCY DEPARTMENT Provider Note   CSN: [id number] Arrival date & time: 10/19/20  2229     History Chief Complaint  Patient presents with  . Near Syncope    Jacob Hunter is a 65 y.o. male.  HPI     Past Medical History:  Diagnosis Date  . Arthritis   . Knee pain   . Necrotizing myopathy   . Pneumonia   . Polymyositis [facility])     Patient Active Problem List   Diagnosis Date Noted  . Osteoarthritis of left knee 10/04/2020  . History of IBS 01/21/2020  . Chronic diarrhea 10/25/2019  . Back pain 07/26/2019  . Spinal stenosis 07/26/2019  . Polymyositis ([place]) 07/26/2019  . Pain in left knee 07/21/2018  . Anxiety and depression 12/30/2015  . Impaired mobility and activities of daily living 12/30/2015  . 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 Bilateral leg weakness 01/24/2015  . Elevated CPK 01/24/2015  . Transaminitis 10/24/2014    Past Surgical History:  Procedure Laterality Date  . ANKLE SURGERY Left   . COLONOSCOPY N/A 10/05/2019   Rourk: 3 colonic polyps removed, 2 were tubular adenomas, cecal polyp was sessile serrated polyp.  Advised 3-year surveillance colonoscopy.  [name] EYE SURGERY     bil cataracts  . KNEE SURGERY Left    arthroscopy x 2  . left wrist surgery    . POLYPECTOMY  10/05/2019   Procedure: POLYPECTOMY;  Surgeon: Daneil Dolin, MD;  Location[facility]O [facility];  Service: Endoscopy;;  . TOTAL KNEE ARTHROPLASTY Left 10/04/2020   Procedure: TOTAL KNEE ARTHROPLASTY;  Surgeon: Sydnee Cabal, MD;  Location: [facility] ORS;  Service: Orthopedics;  Laterality: Left;  adductor canal       Family History  Problem Relation Age of Onset  . Hypertension Father   . Hypercholesterolemia Father   . Hypercholesterolemia Sister   . Hypertension Mother   . Breast cancer Sister   . Colon cancer Neg Hx   . Liver disease Neg Hx     Social History   Tobacco Use  . Smoking status: Never Smoker  . Smokeless tobacco: Never Used  Vaping Use  . Vaping  Use: Never used  Substance Use Topics  . Alcohol use: No    Alcohol/week: 0.0 standard drinks    Comment: Last ETOH was 1-2 drinks in 1990.  . Drug use: No    Home Medications Prior to Admission medications   Medication Sig Start Date End Date Taking? Authorizing Provider  acetaminophen (TYLENOL) 500 MG tablet Take 1,000 mg by mouth every 6 (six) hours as needed for moderate pain.   Yes [provider]  ascorbic acid (VITAMIN C) 1000 MG tablet Take 1,000 mg by mouth daily.   Yes [provider]  aspirin 81 MG EC tablet Take 81 mg by mouth daily. Swallow whole.   Yes [provider]  calcium carbonate (TUMS - DOSED IN MG ELEMENTAL CALCIUM) 500 MG chewable tablet Chew 1 tablet by mouth daily as needed for heartburn.   Yes [provider]  calcium-vitamin D (OSCAL WITH D) 500-200 MG-UNIT tablet Take 1 tablet by mouth daily with breakfast. 07/30/19  Yes Emokpae, Courage, MD  cholecalciferol (VITAMIN D3) 25 MCG (1000 UNIT) tablet Take 1,000 Units by mouth daily.   Yes [provider]  Coenzyme Q10 (CO Q 10) 100 MG CAPS Take 100 mg by mouth daily.   Yes [provider]  FLUoxetine (PROZAC) 20 MG capsule Take 20 mg by mouth  daily.   Yes [provider]  folic acid (FOLVITE) 1 MG tablet Take 1 tablet (1 mg total) by mouth daily. 07/29/19  Yes Emokpae, Courage, MD  methocarbamol (ROBAXIN) 500 MG tablet Take 1 tablet (500 mg total) by mouth 4 (four) times daily. 10/05/20  Yes Haus, Margarita Mail R, PA  methotrexate (RHEUMATREX) 2.5 MG tablet Take 25 mg by mouth every Monday.    Yes [provider]  oxyCODONE (OXY IR/ROXICODONE) 5 MG immediate release tablet Take 5 mg by mouth every 4 (four) hours as needed for pain.   Yes [provider]  predniSONE (DELTASONE) 10 MG tablet Take 10 mg by mouth daily with breakfast.   Yes [provider]  PRESCRIPTION MEDICATION IVIG Antibodies 2 visits every 4 weeks   Yes [provider]  riTUXimab (RITUXAN) 100 MG/10ML injection Inject 1,000 mg into the vein See admin instructions. 2 times a month in March and September   Yes [provider]  vitamin B-12 (CYANOCOBALAMIN) 1000 MCG tablet Take 1,000 mcg by mouth daily.   Yes [provider]  ondansetron (ZOFRAN) 4 MG tablet Take 1 tablet (4 mg total) by mouth daily as needed for nausea or vomiting. Patient not taking: No sig reported 10/05/20 10/05/21  Elizabeth Sauer R, PA    Allergies    Methylprednisolone, Demerol [meperidine], and Statins  Review of Systems   Review of Systems  Physical Exam Updated Vital Signs BP 136/73   Pulse 81   Temp 97.6 F (36.4 C)   Resp 14   Ht 1.854 m (6\' 1" )   Wt 99.8 kg   SpO2 98%   BMI 29.03 kg/m   Physical Exam  ED Results / Procedures / Treatments   Labs (all labs ordered are listed, but only abnormal results are displayed) Labs Reviewed  CBC WITH DIFFERENTIAL/PLATELET  COMPREHENSIVE METABOLIC PANEL  D-DIMER, QUANTITATIVE  CBG MONITORING, ED    EKG None  Radiology No results found.  Procedures Procedures {Remember to document critical care time when appropriate:1}  Medications Ordered in ED Medications  sodium chloride 0.9 % bolus 1,000 mL (1,000 mLs Intravenous New Bag/Given 10/19/20 1104)    And  0.9 %  sodium chloride infusion (has no administration in time range)    ED Course  I have reviewed the triage vital signs and the nursing notes.  Pertinent labs & imaging results that were available during my care of the patient were reviewed by me and considered in my medical decision making (see chart for details).    MDM Rules/Calculators/A&P                          *** Final Clinical Impression(s) / ED Diagnoses Final diagnoses:  None    Rx / DC Orders ED Discharge Orders    None

## 2020-10-19 NOTE — Therapy (Signed)
[place], [place], [address] Phone: [phone]   Fax:  [phone]  Physical Therapy Treatment  Patient Details  Name: Jacob Hunter MRN: [id number] Date of Birth: August 13, 1955 Referring Provider (PT): Dr. Sydnee Cabal, MD   Encounter Date: 10/19/2020   PT End of Session - 10/19/20 0843    Visit Number 6    Number of Visits 18    Authorization Type [employer], no VL, no auth    Progress Note Due on Visit 10    PT Start Time 0845    PT Stop Time 0920    PT Time Calculation (min) 35 min    Activity Tolerance Patient tolerated treatment well;Patient limited by pain    Behavior During Therapy [facility] for tasks assessed/performed           Past Medical History:  Diagnosis Date  . Arthritis   . Knee pain   . Necrotizing myopathy   . Pneumonia   . Polymyositis [facility])     Past Surgical History:  Procedure Laterality Date  . ANKLE SURGERY Left   . COLONOSCOPY N/A 10/05/2019   Rourk: 3 colonic polyps removed, 2 were tubular adenomas, cecal polyp was sessile serrated polyp.  Advised 3-year surveillance colonoscopy.  [name] EYE SURGERY     bil cataracts  . KNEE SURGERY Left    arthroscopy x 2  . left wrist surgery    . POLYPECTOMY  10/05/2019   Procedure: POLYPECTOMY;  Surgeon: Daneil Dolin, MD;  [facility];  Service: Endoscopy;;  . TOTAL KNEE ARTHROPLASTY Left 10/04/2020   Procedure: TOTAL KNEE ARTHROPLASTY;  Surgeon: Sydnee Cabal, MD;  [facility];  Service: Orthopedics;  Laterality: Left;  adductor canal    There were no vitals filed for this visit.   Subjective Assessment - 10/19/20 0843    Subjective Saw MD yesterday to get stitches out and bandage off. Had to walk a lot to get to appointment and more painful today. MD said knee healing well. Elevated legs for 4 hours post appointment so swelling looking better today than after appointment.    Currently in Pain? Yes    Pain  Score 7     Pain Location Knee    Pain Orientation Left    Pain Type Surgical pain    Pain Onset 1 to 4 weeks ago              [facility] Adult PT Treatment/Exercise - 10/19/20 0001      Exercises   Exercises Knee/Hip      Knee/Hip Exercises: Stretches   Active Hamstring Stretch 5 reps;20 seconds    Active Hamstring Stretch Limitations standing 12in step alternating between knee drive and hamstring stretch (flexion to extension)    Knee: Self-Stretch to increase Flexion Left;5 reps;10 seconds    Gastroc Stretch 3 reps;30 seconds    Gastroc Stretch Limitations slant board      Knee/Hip Exercises: Aerobic   Recumbent Bike Rocking for mobility, seat 20; 46min      Knee/Hip Exercises: Standing   Heel Raises Both;10 reps;1 set   on slope   Heel Raises Limitations toe raises on slope    Rocker Board 2 minutes    Rocker Board Limitations fwd/bwd, lateral            PT Education - 10/19/20 [id number]    Education Details Discussed purpose and technique of interventions throughout session.    Person(s) Educated Patient  Methods Explanation    Comprehension Verbalized understanding            PT Short Term Goals - 10/19/20 0844      PT SHORT TERM GOAL #1   Title Patient will report at least 25% improvement in symptoms for improved quality of life.    Time 4    Period Weeks    Status On-going    Target Date 11/03/20      PT SHORT TERM GOAL #2   Title Patient will demonstrate left knee flexion to 90 degrees and 5 degrees lacking extension to improve gait mechanics    Baseline 15 degrees lacking extension, 75 degrees flexion    Time 4    Period Weeks    Status On-going    Target Date 11/03/20      PT SHORT TERM GOAL #3   Title Demo improved ambulation as evident by distance of 150 ft during 2MWT    Baseline 70 ft with RW    Time 4    Period Weeks    Status On-going    Target Date 11/03/20             PT Long Term Goals - 10/19/20 0844      PT LONG TERM GOAL #1    Title Patient will be independent with HEP in order to improve functional outcomes.    Time 6    Period Weeks    Status On-going      PT LONG TERM GOAL #2   Title Patient will improve on FOTO score to meet predicted outcomes to improve functional independence    Baseline 26.4% function    Time 6    Period Weeks    Status On-going      PT LONG TERM GOAL #3   Title Demo 110 degrees left knee flexion and full extension in order to facilitate normalized gait pattern and transfers    Time 6    Period Weeks    Status On-going             Plan - 10/19/20 0844    Clinical Impression Statement Session continued to focus on left knee ROM, quad/ham strength and edema control. Patient performed  revolutions on recumbent bike at seat 20 at beginning of session for continue ROM performance. Continued rockerboard anterior/posterior and medial/lateral. Patient required rest break due to complaints of fatigue. Patient then stated he needed to go to the restroom but became pale and started shaking. Patient seated in a chair with assistance and monitored. Patient lost consciousness x2. EMS was activated at this time and rest of session was cancelled.    Personal Factors and Comorbidities Comorbidity 1    Comorbidities PMH    Examination-Activity Limitations Bed Mobility;Bend;Lift;Toileting;Stand;Stairs;Squat;Locomotion Level;Transfers    Examination-Participation Restrictions Cleaning;Community Activity;Interpersonal Relationship;Yard Work;Shop;Driving    Stability/Clinical Decision Making Stable/Uncomplicated    Rehab Potential Excellent    PT Frequency 3x / week    PT Duration 6 weeks    PT Treatment/Interventions ADLs/Self Care Home Management;Aquatic Therapy;Cryotherapy;Electrical Stimulation;DME Instruction;Moist Heat;Gait training;Stair training;Functional mobility training;Therapeutic activities;Therapeutic exercise;Balance training;Patient/family education;Neuromuscular  re-education;Wheelchair mobility training;Manual techniques;Passive range of motion;Taping;Energy conservation;Spinal Manipulations;Joint Manipulations    PT Next Visit Plan Continue with TKR rehab protocol    PT Home Exercise Plan QS, heel slides, knee flexion drive on 6" stair; stand heel/toe raise; 10/16/20 - LAQ, seated heel slide    Consulted and Agree with Plan of Care Patient  Patient will benefit from skilled therapeutic intervention in order to improve the following deficits and impairments:  Abnormal gait,Decreased activity tolerance,Decreased balance,Decreased mobility,Decreased endurance,Decreased range of motion,Decreased strength,Increased edema,Difficulty walking,Impaired perceived functional ability,Improper body mechanics,Pain  Visit Diagnosis: Chronic pain of left knee  Muscle weakness (generalized)  Localized edema  Other symptoms and signs involving the musculoskeletal system     Problem List Patient Active Problem List   Diagnosis Date Noted  . Osteoarthritis of left knee 10/04/2020  . History of IBS 01/21/2020  . Chronic diarrhea 10/25/2019  . Back pain 07/26/2019  . Spinal stenosis 07/26/2019  . Polymyositis ([place]) 07/26/2019  . Pain in left knee 07/21/2018  . Anxiety and depression 12/30/2015  . Impaired mobility and activities of daily living 12/30/2015  . 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 Bilateral leg weakness 01/24/2015  . Elevated CPK 01/24/2015  . Transaminitis 10/24/2014   Floria Raveling. Hartnett-Rands, MS, PT [address] [place] [phone]  Jeannie Done 10/19/2020, 12:16 PM  [facility] [address] [place], [place], [address] Phone: [phone]   Fax:  [phone]  Name: NATURE VOGELSANG MRN: [id number] Date of Birth: 19-Oct-1955

## 2020-10-19 NOTE — ED Notes (Signed)
No pacemaker present

## 2020-10-19 NOTE — Discharge Instructions (Signed)
When she doesWork-up today was reassuring, which to follow-up with your PCP in the next couple of days.  Please use the attached instructions.  As we discussed your Percocet and your Robaxin, these can cause you to feel dizzy and passed out.  Make sure to eat and drink normally.  If you have any new worsening concerning symptoms please come back to the emergency department.  Please refrain from driving or taking long showers by herself until cleared by PCP or Get help right away if: You have a very bad headache. You pass out once or more than once. You have pain in your chest, belly, or back. You have a very fast or uneven heartbeat (palpitations). It hurts to breathe. You are bleeding from your mouth or your bottom (rectum). You have black or tarry poop (stool). You have jerky movements that you cannot control (seizure). You are confused. You have trouble walking. You are very weak. You have vision problems.

## 2020-10-20 ENCOUNTER — Ambulatory Visit (HOSPITAL_COMMUNITY): Payer: PPO | Admitting: Physical Therapy

## 2020-10-23 ENCOUNTER — Other Ambulatory Visit: Payer: Self-pay

## 2020-10-23 ENCOUNTER — Encounter (HOSPITAL_COMMUNITY): Payer: Self-pay

## 2020-10-23 ENCOUNTER — Ambulatory Visit (HOSPITAL_COMMUNITY): Payer: PPO

## 2020-10-23 DIAGNOSIS — M6281 Muscle weakness (generalized): Secondary | ICD-10-CM

## 2020-10-23 DIAGNOSIS — R29898 Other symptoms and signs involving the musculoskeletal system: Secondary | ICD-10-CM

## 2020-10-23 DIAGNOSIS — G8929 Other chronic pain: Secondary | ICD-10-CM

## 2020-10-23 DIAGNOSIS — M25562 Pain in left knee: Secondary | ICD-10-CM | POA: Diagnosis not present

## 2020-10-23 DIAGNOSIS — R600 Localized edema: Secondary | ICD-10-CM

## 2020-10-23 NOTE — Therapy (Signed)
[place], [place], [address] Phone: [phone]   Fax:  [phone]  Physical Therapy Treatment  Patient Details  Name: Jacob Hunter MRN: [id number] Date of Birth: November 05, 1955 Referring Provider (PT): Dr. Sydnee Cabal, MD   Encounter Date: 10/23/2020   PT End of Session - 10/23/20 0816    Visit Number 7    Number of Visits 18    Authorization Type [employer], no VL, no auth    Progress Note Due on Visit 10    PT Start Time 0813    PT Stop Time 0900    PT Time Calculation (min) 47 min    Activity Tolerance Patient tolerated treatment well;Patient limited by pain    Behavior During Therapy [facility] for tasks assessed/performed           Past Medical History:  Diagnosis Date  . Arthritis   . Knee pain   . Necrotizing myopathy   . Pneumonia   . Polymyositis [facility])     Past Surgical History:  Procedure Laterality Date  . ANKLE SURGERY Left   . COLONOSCOPY N/A 10/05/2019   Rourk: 3 colonic polyps removed, 2 were tubular adenomas, cecal polyp was sessile serrated polyp.  Advised 3-year surveillance colonoscopy.  [name] EYE SURGERY     bil cataracts  . KNEE SURGERY Left    arthroscopy x 2  . left wrist surgery    . POLYPECTOMY  10/05/2019   Procedure: POLYPECTOMY;  Surgeon: Daneil Dolin, MD;  [facility];  Service: Endoscopy;;  . TOTAL KNEE ARTHROPLASTY Left 10/04/2020   Procedure: TOTAL KNEE ARTHROPLASTY;  Surgeon: Sydnee Cabal, MD;  [facility];  Service: Orthopedics;  Laterality: Left;  adductor canal    There were no vitals filed for this visit.   Subjective Assessment - 10/23/20 0814    Subjective Patient reports no incidents over the weekend and he has since discontinued use of muscle relaxer.  Reports his left knee is feeling better and noted swelling has decreased. Rates pain at 5/10 with improved walking tolerance    Currently in Pain? Yes    Pain Score 5     Pain  Location Knee    Pain Orientation Left    Pain Descriptors / Indicators Aching    Pain Type Surgical pain              [facility] PT Assessment - 10/23/20 0001      Assessment   Medical Diagnosis Left TKR    Referring Provider (PT) Dr. Sydnee Cabal, MD    Onset Date/Surgical Date 10/04/20                         [facility] Adult PT Treatment/Exercise - 10/23/20 0001      Knee/Hip Exercises: Stretches   Gastroc Stretch 4 reps;60 seconds    Gastroc Stretch Limitations slant board      Knee/Hip Exercises: Aerobic   Nustep LE only x 6 min resistance 3      Knee/Hip Exercises: Standing   Heel Raises Both;2 sets;10 reps    Other Standing Knee Exercises knee drive stretch 9E17 5 sec hold 12" step    Other Standing Knee Exercises sidestepping with red loop 2x2 min      Knee/Hip Exercises: Seated   Long Arc Quad Strengthening;Left;2 sets;10 reps   red t-loop   Hamstring Curl Strengthening;Left;2 sets;10 reps   red t-band  Knee/Hip Exercises: Supine   Terminal Knee Extension Strengthening;Left;2 sets;10 reps   2-3 plates                   PT Short Term Goals - 10/19/20 0844      PT SHORT TERM GOAL #1   Title Patient will report at least 25% improvement in symptoms for improved quality of life.    Time 4    Period Weeks    Status On-going    Target Date 11/03/20      PT SHORT TERM GOAL #2   Title Patient will demonstrate left knee flexion to 90 degrees and 5 degrees lacking extension to improve gait mechanics    Baseline 15 degrees lacking extension, 75 degrees flexion    Time 4    Period Weeks    Status On-going    Target Date 11/03/20      PT SHORT TERM GOAL #3   Title Demo improved ambulation as evident by distance of 150 ft during 2MWT    Baseline 70 ft with RW    Time 4    Period Weeks    Status On-going    Target Date 11/03/20             PT Long Term Goals - 10/19/20 0844      PT LONG TERM GOAL #1   Title Patient will be  independent with HEP in order to improve functional outcomes.    Time 6    Period Weeks    Status On-going      PT LONG TERM GOAL #2   Title Patient will improve on FOTO score to meet predicted outcomes to improve functional independence    Baseline 26.4% function    Time 6    Period Weeks    Status On-going      PT LONG TERM GOAL #3   Title Demo 110 degrees left knee flexion and full extension in order to facilitate normalized gait pattern and transfers    Time 6    Period Weeks    Status On-going                 Plan - 10/23/20 0840    Clinical Impression Statement Patient reports overall feeling better and no adverse events since last therapy session.  Patient demonstrates improved activity tolerance only requiring minimal rest periods between activities and exhibits increased LLE weight acceptance during gait activities.  Progressing with HEP development to include PRE with resistance bands and no adverse effects or increase in pain reported..  Pt reports pain level at tolerable degree and does not request intervention at this time.    Personal Factors and Comorbidities Comorbidity 1    Comorbidities PMH    Examination-Activity Limitations Bed Mobility;Bend;Lift;Toileting;Stand;Stairs;Squat;Locomotion Level;Transfers    Examination-Participation Restrictions Cleaning;Community Activity;Interpersonal Relationship;Yard Work;Shop;Driving    Stability/Clinical Decision Making Stable/Uncomplicated    Rehab Potential Excellent    PT Frequency 3x / week    PT Duration 6 weeks    PT Treatment/Interventions ADLs/Self Care Home Management;Aquatic Therapy;Cryotherapy;Electrical Stimulation;DME Instruction;Moist Heat;Gait training;Stair training;Functional mobility training;Therapeutic activities;Therapeutic exercise;Balance training;Patient/family education;Neuromuscular re-education;Wheelchair mobility training;Manual techniques;Passive range of motion;Taping;Energy conservation;Spinal  Manipulations;Joint Manipulations    PT Next Visit Plan Continue with TKR rehab protocol    PT Home Exercise Plan QS, heel slides, knee flexion drive on 6" stair; stand heel/toe raise; 10/16/20 - LAQ, seated heel slide    Consulted and Agree with Plan of Care Patient  Patient will benefit from skilled therapeutic intervention in order to improve the following deficits and impairments:  Abnormal gait,Decreased activity tolerance,Decreased balance,Decreased mobility,Decreased endurance,Decreased range of motion,Decreased strength,Increased edema,Difficulty walking,Impaired perceived functional ability,Improper body mechanics,Pain  Visit Diagnosis: Chronic pain of left knee  Muscle weakness (generalized)  Localized edema  Other symptoms and signs involving the musculoskeletal system     Problem List Patient Active Problem List   Diagnosis Date Noted  . Osteoarthritis of left knee 10/04/2020  . History of IBS 01/21/2020  . Chronic diarrhea 10/25/2019  . Back pain 07/26/2019  . Spinal stenosis 07/26/2019  . Polymyositis ([place]) 07/26/2019  . Pain in left knee 07/21/2018  . Anxiety and depression 12/30/2015  . Impaired mobility and activities of daily living 12/30/2015  . 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 Bilateral leg weakness 01/24/2015  . Elevated CPK 01/24/2015  . Transaminitis 10/24/2014   9:01 AM, 10/23/20 M. Sherlyn Lees, PT, DPT Physical Therapist- [place] Office Number: [phone]  [facility] [address] [place], [place], [address] Phone: [phone]   Fax:  [phone]  Name: Jacob Hunter MRN: [id number] Date of Birth: 21-Feb-1956

## 2020-10-24 ENCOUNTER — Ambulatory Visit (HOSPITAL_COMMUNITY): Payer: PPO

## 2020-10-24 ENCOUNTER — Encounter (HOSPITAL_COMMUNITY): Payer: Self-pay

## 2020-10-24 DIAGNOSIS — G8929 Other chronic pain: Secondary | ICD-10-CM

## 2020-10-24 DIAGNOSIS — R600 Localized edema: Secondary | ICD-10-CM

## 2020-10-24 DIAGNOSIS — M6281 Muscle weakness (generalized): Secondary | ICD-10-CM

## 2020-10-24 DIAGNOSIS — M25562 Pain in left knee: Secondary | ICD-10-CM | POA: Diagnosis not present

## 2020-10-24 DIAGNOSIS — R29898 Other symptoms and signs involving the musculoskeletal system: Secondary | ICD-10-CM

## 2020-10-24 NOTE — Therapy (Signed)
[place], [place], [address] Phone: [phone]   Fax:  [phone]  Physical Therapy Treatment  Patient Details  Name: Jacob Hunter MRN: [id number] Date of Birth: November 08, 1955 Referring Provider (PT): Dr. Sydnee Cabal, MD   Encounter Date: 10/24/2020   PT End of Session - 10/24/20 0321    Visit Number 8    Number of Visits 18    Authorization Type [employer], no VL, no auth    Progress Note Due on Visit 10    PT Start Time 0815    PT Stop Time 0900    PT Time Calculation (min) 45 min    Activity Tolerance Patient tolerated treatment well;Patient limited by pain    Behavior During Therapy [facility] for tasks assessed/performed           Past Medical History:  Diagnosis Date  . Arthritis   . Knee pain   . Necrotizing myopathy   . Pneumonia   . Polymyositis [facility])     Past Surgical History:  Procedure Laterality Date  . ANKLE SURGERY Left   . COLONOSCOPY N/A 10/05/2019   Rourk: 3 colonic polyps removed, 2 were tubular adenomas, cecal polyp was sessile serrated polyp.  Advised 3-year surveillance colonoscopy.  [name] EYE SURGERY     bil cataracts  . KNEE SURGERY Left    arthroscopy x 2  . left wrist surgery    . POLYPECTOMY  10/05/2019   Procedure: POLYPECTOMY;  Surgeon: Daneil Dolin, MD;  [facility];  Service: Endoscopy;;  . TOTAL KNEE ARTHROPLASTY Left 10/04/2020   Procedure: TOTAL KNEE ARTHROPLASTY;  Surgeon: Sydnee Cabal, MD;  [facility];  Service: Orthopedics;  Laterality: Left;  adductor canal    There were no vitals filed for this visit.   Subjective Assessment - 10/24/20 0818    Subjective Reports left knee feels tight from his band resistance exercises but other than that feeling better and he has been able to initiate driving    Currently in Pain? Yes    Pain Score 5     Pain Location Knee    Pain Orientation Left    Pain Descriptors / Indicators  Aching;Tightness    Pain Type Surgical pain              [facility] PT Assessment - 10/24/20 0001      Assessment   Medical Diagnosis Left TKR    Referring Provider (PT) Dr. Sydnee Cabal, MD    Onset Date/Surgical Date 10/04/20    Next MD Visit 11/15/20      AROM   Left Knee Extension 6   lacking   Left Knee Flexion 94                         [facility] Adult PT Treatment/Exercise - 10/24/20 0001      Knee/Hip Exercises: Aerobic   Nustep LE only x 6 min resistance 3      Knee/Hip Exercises: Machines for Strengthening   Cybex Knee Flexion 3x10 3 plates    Total Gym Leg Press 30 degree incline 3x10      Knee/Hip Exercises: Seated   Long Arc Quad Strengthening;Both;3 sets;10 reps    Long Arc Quad Weight 3 lbs.      Knee/Hip Exercises: Supine   Quad Sets Strengthening;Left    Quad Sets Limitations 2 min at self-pace with ice applied during time  Heel Slides AROM;Left;3 sets;10 reps    Heel Slides Limitations with ball under heel    Terminal Knee Extension AROM;Left;3 sets;10 reps                  PT Education - 10/24/20 0835    Education Details HEP additions    Person(s) Educated Patient    Methods Explanation    Comprehension Verbalized understanding            PT Short Term Goals - 10/19/20 0844      PT SHORT TERM GOAL #1   Title Patient will report at least 25% improvement in symptoms for improved quality of life.    Time 4    Period Weeks    Status On-going    Target Date 11/03/20      PT SHORT TERM GOAL #2   Title Patient will demonstrate left knee flexion to 90 degrees and 5 degrees lacking extension to improve gait mechanics    Baseline 15 degrees lacking extension, 75 degrees flexion    Time 4    Period Weeks    Status On-going    Target Date 11/03/20      PT SHORT TERM GOAL #3   Title Demo improved ambulation as evident by distance of 150 ft during 2MWT    Baseline 70 ft with RW    Time 4    Period Weeks    Status  On-going    Target Date 11/03/20             PT Long Term Goals - 10/19/20 0844      PT LONG TERM GOAL #1   Title Patient will be independent with HEP in order to improve functional outcomes.    Time 6    Period Weeks    Status On-going      PT LONG TERM GOAL #2   Title Patient will improve on FOTO score to meet predicted outcomes to improve functional independence    Baseline 26.4% function    Time 6    Period Weeks    Status On-going      PT LONG TERM GOAL #3   Title Demo 110 degrees left knee flexion and full extension in order to facilitate normalized gait pattern and transfers    Time 6    Period Weeks    Status On-going                 Plan - 10/24/20 0848    Clinical Impression Statement Progressing with current POC very well and tolerating current progression without adverse effects.  Continued tx indicated to improve ROM and strength to facilitate normalized gait pattern and progress for cane ambulation    Personal Factors and Comorbidities Comorbidity 1    Comorbidities PMH    Examination-Activity Limitations Bed Mobility;Bend;Lift;Toileting;Stand;Stairs;Squat;Locomotion Level;Transfers    Examination-Participation Restrictions Cleaning;Community Activity;Interpersonal Relationship;Yard Work;Shop;Driving    Stability/Clinical Decision Making Stable/Uncomplicated    Rehab Potential Excellent    PT Frequency 3x / week    PT Duration 6 weeks    PT Treatment/Interventions ADLs/Self Care Home Management;Aquatic Therapy;Cryotherapy;Electrical Stimulation;DME Instruction;Moist Heat;Gait training;Stair training;Functional mobility training;Therapeutic activities;Therapeutic exercise;Balance training;Patient/family education;Neuromuscular re-education;Wheelchair mobility training;Manual techniques;Passive range of motion;Taping;Energy conservation;Spinal Manipulations;Joint Manipulations    PT Next Visit Plan Continue with TKR rehab protocol    PT Home Exercise Plan  QS, heel slides, knee flexion drive on 6" stair; stand heel/toe raise; 10/16/20 - LAQ, seated heel slide. Seated [place] PRE with red t-loop  Consulted and Agree with Plan of Care Patient           Patient will benefit from skilled therapeutic intervention in order to improve the following deficits and impairments:  Abnormal gait,Decreased activity tolerance,Decreased balance,Decreased mobility,Decreased endurance,Decreased range of motion,Decreased strength,Increased edema,Difficulty walking,Impaired perceived functional ability,Improper body mechanics,Pain  Visit Diagnosis: Chronic pain of left knee  Muscle weakness (generalized)  Localized edema  Other symptoms and signs involving the musculoskeletal system     Problem List Patient Active Problem List   Diagnosis Date Noted  . Osteoarthritis of left knee 10/04/2020  . History of IBS 01/21/2020  . Chronic diarrhea 10/25/2019  . Back pain 07/26/2019  . Spinal stenosis 07/26/2019  . Polymyositis ([place]) 07/26/2019  . Pain in left knee 07/21/2018  . Anxiety and depression 12/30/2015  . Impaired mobility and activities of daily living 12/30/2015  . 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 Bilateral leg weakness 01/24/2015  . Elevated CPK 01/24/2015  . Transaminitis 10/24/2014   9:58 AM, 10/24/20 M. Sherlyn Lees, PT, DPT Physical Therapist- [place] Office Number: [phone]  [place] [address] [place], [place], [address] Phone: [phone]   Fax:  [phone]  Name: HOGAN HOOBLER MRN: [id number] Date of Birth: September 12, 1955

## 2020-10-25 DIAGNOSIS — G7289 Other specified myopathies: Secondary | ICD-10-CM | POA: Diagnosis not present

## 2020-10-26 ENCOUNTER — Ambulatory Visit (HOSPITAL_COMMUNITY): Payer: PPO

## 2020-10-26 ENCOUNTER — Other Ambulatory Visit: Payer: Self-pay

## 2020-10-26 DIAGNOSIS — M25562 Pain in left knee: Secondary | ICD-10-CM | POA: Diagnosis not present

## 2020-10-26 DIAGNOSIS — M6281 Muscle weakness (generalized): Secondary | ICD-10-CM

## 2020-10-26 DIAGNOSIS — R600 Localized edema: Secondary | ICD-10-CM

## 2020-10-26 DIAGNOSIS — G8929 Other chronic pain: Secondary | ICD-10-CM

## 2020-10-26 DIAGNOSIS — R29898 Other symptoms and signs involving the musculoskeletal system: Secondary | ICD-10-CM

## 2020-10-26 NOTE — Patient Instructions (Signed)
Scar Tissue Massage    Place pad of fingertip on scar area. Apply steady downward pressure while moving up/down length of incision; side to side and in a circular fashion clockwise and counter clockwise. Use another fin-ger on top to assist. Repeat until entire scar has been covered. Repeat _30_ times in each direction. Do 1_ sessions per day.  Copyright  VHI. All rights reserved.

## 2020-10-26 NOTE — Therapy (Signed)
[place], [place], [address] Phone: [phone]   Fax:  [phone]  Physical Therapy Treatment  Patient Details  Name: Jacob Hunter MRN: [id number] Date of Birth: 1956/02/05 Referring Provider (PT): Dr. Sydnee Cabal, MD   Encounter Date: 10/26/2020   PT End of Session - 10/26/20 0915    Visit Number 9    Number of Visits 18    Authorization Type [employer], no VL, no auth    Progress Note Due on Visit 10    PT Start Time 0845    Activity Tolerance Patient tolerated treatment well;Patient limited by pain    Behavior During Therapy [facility] for tasks assessed/performed           Past Medical History:  Diagnosis Date  . Arthritis   . Knee pain   . Necrotizing myopathy   . Pneumonia   . Polymyositis [facility])     Past Surgical History:  Procedure Laterality Date  . ANKLE SURGERY Left   . COLONOSCOPY N/A 10/05/2019   Rourk: 3 colonic polyps removed, 2 were tubular adenomas, cecal polyp was sessile serrated polyp.  Advised 3-year surveillance colonoscopy.  [name] EYE SURGERY     bil cataracts  . KNEE SURGERY Left    arthroscopy x 2  . left wrist surgery    . POLYPECTOMY  10/05/2019   Procedure: POLYPECTOMY;  Surgeon: Daneil Dolin, MD;  [facility];  Service: Endoscopy;;  . TOTAL KNEE ARTHROPLASTY Left 10/04/2020   Procedure: TOTAL KNEE ARTHROPLASTY;  Surgeon: Sydnee Cabal, MD;  [facility];  Service: Orthopedics;  Laterality: Left;  adductor canal    There were no vitals filed for this visit.   Subjective Assessment - 10/26/20 0900    Subjective Left knee was quite swollen after infusion appointment yesterday so spent most of the rest of the day with leg elevated. Swelling improved today. Drove himself to appointment today. All steri-strips are off incision.    Currently in Pain? Yes    Pain Score 5     Pain Location Knee    Pain Type Surgical pain            [facility]  Adult PT Treatment/Exercise - 10/26/20 0001      Knee/Hip Exercises: Stretches   Gastroc Stretch 4 reps;60 seconds    Gastroc Stretch Limitations slant board      Knee/Hip Exercises: Aerobic   Nustep LE only x 6 min resistance 3   seat on 4     Knee/Hip Exercises: Standing   Heel Raises Both;2 sets;10 reps    Other Standing Knee Exercises knee drive stretch [id number] 5 sec hold 12" step      Knee/Hip Exercises: Seated   Long Arc Quad Strengthening;Both;3 sets;10 reps    Long Arc Quad Weight 3 lbs.      Knee/Hip Exercises: Supine   Quad Sets Strengthening;Left;2 sets;10 reps    Heel Slides AROM;Left;3 sets;10 reps    Heel Slides Limitations with ball under heel    Terminal Knee Extension AROM;Left;10 reps;2 sets              PT Education - 10/26/20 0914    Education Details Discussed purpose and technique of interventions throughout session. Self scar massage.    Person(s) Educated Patient    Methods Explanation;Handout    Comprehension Verbalized understanding            PT Short Term Goals - 10/26/20 [phone]  PT SHORT TERM GOAL #1   Title Patient will report at least 25% improvement in symptoms for improved quality of life.    Time 4    Period Weeks    Status On-going    Target Date 11/03/20      PT SHORT TERM GOAL #2   Title Patient will demonstrate left knee flexion to 90 degrees and 5 degrees lacking extension to improve gait mechanics    Baseline 15 degrees lacking extension, 75 degrees flexion    Time 4    Period Weeks    Status On-going    Target Date 11/03/20      PT SHORT TERM GOAL #3   Title Demo improved ambulation as evident by distance of 150 ft during 2MWT    Baseline 70 ft with RW    Time 4    Period Weeks    Status On-going    Target Date 11/03/20             PT Long Term Goals - 10/26/20 0918      PT LONG TERM GOAL #1   Title Patient will be independent with HEP in order to improve functional outcomes.    Time 6    Period Weeks     Status On-going      PT LONG TERM GOAL #2   Title Patient will improve on FOTO score to meet predicted outcomes to improve functional independence    Baseline 26.4% function    Time 6    Period Weeks    Status On-going      PT LONG TERM GOAL #3   Title Demo 110 degrees left knee flexion and full extension in order to facilitate normalized gait pattern and transfers    Time 6    Period Weeks    Status On-going            Plan - 10/26/20 0915    Clinical Impression Statement Session focused on left knee ROM and quad strength. Added self scar tissue massage to HEP. Continued with preious therapeutic exercises. Patient tolerated treatment well. Patient reported a 4/10 pain at end of session; a decrease from 5/10 at beginning of session.    Personal Factors and Comorbidities Comorbidity 1    Comorbidities PMH    Examination-Activity Limitations Bed Mobility;Bend;Lift;Toileting;Stand;Stairs;Squat;Locomotion Level;Transfers    Examination-Participation Restrictions Cleaning;Community Activity;Interpersonal Relationship;Yard Work;Shop;Driving    Stability/Clinical Decision Making Stable/Uncomplicated    Rehab Potential Excellent    PT Frequency 3x / week    PT Duration 6 weeks    PT Treatment/Interventions ADLs/Self Care Home Management;Aquatic Therapy;Cryotherapy;Electrical Stimulation;DME Instruction;Moist Heat;Gait training;Stair training;Functional mobility training;Therapeutic activities;Therapeutic exercise;Balance training;Patient/family education;Neuromuscular re-education;Wheelchair mobility training;Manual techniques;Passive range of motion;Taping;Energy conservation;Spinal Manipulations;Joint Manipulations    PT Next Visit Plan Continue with TKR rehab protocol; 10th visit next session.    PT Home Exercise Plan QS, heel slides, knee flexion drive on 6" stair; stand heel/toe raise; 10/16/20 - LAQ, seated heel slide. Seated OKC PRE with red t-loop; 3/17-22 - self scar massage     Consulted and Agree with Plan of Care Patient           Patient will benefit from skilled therapeutic intervention in order to improve the following deficits and impairments:  Abnormal gait,Decreased activity tolerance,Decreased balance,Decreased mobility,Decreased endurance,Decreased range of motion,Decreased strength,Increased edema,Difficulty walking,Impaired perceived functional ability,Improper body mechanics,Pain  Visit Diagnosis: Chronic pain of left knee  Muscle weakness (generalized)  Localized edema  Other symptoms and signs involving the musculoskeletal system  Problem List Patient Active Problem List   Diagnosis Date Noted  . Osteoarthritis of left knee 10/04/2020  . History of IBS 01/21/2020  . Chronic diarrhea 10/25/2019  . Back pain 07/26/2019  . Spinal stenosis 07/26/2019  . Polymyositis ([place]) 07/26/2019  . Pain in left knee 07/21/2018  . Anxiety and depression 12/30/2015  . Impaired mobility and activities of daily living 12/30/2015  . 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 Bilateral leg weakness 01/24/2015  . Elevated CPK 01/24/2015  . Transaminitis 10/24/2014   Floria Raveling. Hartnett-Rands, MS, PT [place] [phone]  Jeannie Done 10/26/2020, 9:45 AM  [place] [address] [place], [place], [address] Phone: [phone]   Fax:  [phone]  Name: Jacob Hunter MRN: [id number] Date of Birth: [birth year]

## 2020-10-30 ENCOUNTER — Ambulatory Visit (HOSPITAL_COMMUNITY): Payer: PPO

## 2020-10-30 ENCOUNTER — Other Ambulatory Visit: Payer: Self-pay

## 2020-10-30 ENCOUNTER — Encounter (HOSPITAL_COMMUNITY): Payer: Self-pay

## 2020-10-30 DIAGNOSIS — G8929 Other chronic pain: Secondary | ICD-10-CM

## 2020-10-30 DIAGNOSIS — M6281 Muscle weakness (generalized): Secondary | ICD-10-CM

## 2020-10-30 DIAGNOSIS — M25562 Pain in left knee: Secondary | ICD-10-CM

## 2020-10-30 DIAGNOSIS — R600 Localized edema: Secondary | ICD-10-CM

## 2020-10-30 NOTE — Therapy (Signed)
.[place] [address] [place], [place], [address] Phone: [phone]   Fax:  [phone]  Physical Therapy Treatment and Progress Note  Patient Details  Name: Jacob Hunter MRN: [id number] Date of Birth: 11-20-1955 Referring Provider (PT): Dr. Sydnee Cabal, MD  Progress Note Reporting Period  to 10/30/20  See note below for Objective Data and Assessment of Progress/Goals.       Encounter Date: 10/30/2020   PT End of Session - 10/30/20 0819    Visit Number 10    Number of Visits 18    Authorization Type [employer], no VL, no auth    Progress Note Due on Visit 20    PT Start Time 0812    PT Stop Time 0900    PT Time Calculation (min) 48 min    Activity Tolerance Patient tolerated treatment well;Patient limited by pain    Behavior During Therapy [facility] for tasks assessed/performed           Past Medical History:  Diagnosis Date  . Arthritis   . Knee pain   . Necrotizing myopathy   . Pneumonia   . Polymyositis [facility])     Past Surgical History:  Procedure Laterality Date  . ANKLE SURGERY Left   . COLONOSCOPY N/A 10/05/2019   Rourk: 3 colonic polyps removed, 2 were tubular adenomas, cecal polyp was sessile serrated polyp.  Advised 3-year surveillance colonoscopy.  [name] EYE SURGERY     bil cataracts  . KNEE SURGERY Left    arthroscopy x 2  . left wrist surgery    . POLYPECTOMY  10/05/2019   Procedure: POLYPECTOMY;  Surgeon: Daneil Dolin, MD;  [facility];  Service: Endoscopy;;  . TOTAL KNEE ARTHROPLASTY Left 10/04/2020   Procedure: TOTAL KNEE ARTHROPLASTY;  Surgeon: Sydnee Cabal, MD;  [facility];  Service: Orthopedics;  Laterality: Left;  adductor canal    There were no vitals filed for this visit.   Subjective Assessment - 10/30/20 0818    Subjective Patient reports feeling better overall and has been using a cycling device at home for ROM    Currently in Pain? Yes    Pain Score  3     Pain Location Knee    Pain Orientation Left    Pain Descriptors / Indicators Aching;Tightness    Pain Type Surgical pain              [facility] PT Assessment - 10/30/20 0001      Assessment   Medical Diagnosis Left TKR    Referring Provider (PT) Dr. Sydnee Cabal, MD    Onset Date/Surgical Date 10/04/20    Next MD Visit 11/06/20      Observation/Other Assessments   Focus on Therapeutic Outcomes (FOTO)  40.9% function      AROM   Left Knee Extension 5   lacking   Left Knee Flexion 105                         [facility] Adult PT Treatment/Exercise - 10/30/20 0001      Ambulation/Gait   Ambulation/Gait Yes    Ambulation/Gait Assistance 6: Modified independent (Device/Increase time)    Ambulation Distance (Feet) 300 Feet    Assistive device Rolling walker    Gait Pattern Lateral trunk lean to left    Ambulation Surface Level;Indoor    Gait velocity WFL    Gait Comments 2MWT  Knee/Hip Exercises: Stretches   Sports administrator Left;4 reps;60 seconds   prone with belt   Gastroc Stretch 4 reps;60 seconds    Gastroc Stretch Limitations slant board      Knee/Hip Exercises: Aerobic   Nustep LE only x 6 min resistance 5      Knee/Hip Exercises: Machines for Strengthening   Cybex Knee Flexion 3x10 5 plates      Knee/Hip Exercises: Seated   Sit to Sand 2 sets;10 reps;without UE support   24" seat height     Knee/Hip Exercises: Supine   Heel Prop for Knee Extension Other (comment)   3x10                 PT Education - 10/30/20 0839    Education Details educated on HEP continuation and use of cane vs RW at this time    Person(s) Educated Patient    Methods Explanation    Comprehension Verbalized understanding;Returned demonstration            PT Short Term Goals - 10/30/20 0820      PT SHORT TERM GOAL #1   Title Patient will report at least 25% improvement in symptoms for improved quality of life.    Baseline 50% improvement    Time 4     Period Weeks    Status Achieved    Target Date 11/03/20      PT SHORT TERM GOAL #2   Title Patient will demonstrate left knee flexion to 90 degrees and 5 degrees lacking extension to improve gait mechanics    Baseline lacking 5 degrees extension, 105 flexion    Time 4    Period Weeks    Status Achieved    Target Date 11/03/20      PT SHORT TERM GOAL #3   Title Demo improved ambulation as evident by distance of 150 ft during 2MWT    Baseline 70 ft with RW; 300 ft with RW    Time 4    Period Weeks    Status Achieved    Target Date 11/03/20             PT Long Term Goals - 10/30/20 0827      PT LONG TERM GOAL #1   Title Patient will be independent with HEP in order to improve functional outcomes.    Time 6    Period Weeks    Status Achieved      PT LONG TERM GOAL #2   Title Patient will improve on FOTO score to meet predicted outcomes to improve functional independence    Baseline 26.4% function; 40.9% function    Time 6    Period Weeks    Status On-going      PT LONG TERM GOAL #3   Title Demo 110 degrees left knee flexion and full extension in order to facilitate normalized gait pattern and transfers    Baseline lacking 5 degrees extension, 105 flexion    Time 6    Period Weeks    Status On-going                 Plan - 10/30/20 2952    Clinical Impression Statement Pt progressing well with POC details and able to meet several STG/LTG and significant improvement in FOTO score.  Continued POC indicated to progress for more complex, compound movements to improve functional performance    Personal Factors and Comorbidities Comorbidity 1    Comorbidities PMH    Examination-Activity  Limitations Bed Mobility;Bend;Lift;Toileting;Stand;Stairs;Squat;Locomotion Level;Transfers    Examination-Participation Restrictions Cleaning;Community Activity;Interpersonal Relationship;Yard Work;Shop;Driving    Stability/Clinical Decision Making Stable/Uncomplicated    Rehab  Potential Excellent    PT Frequency 3x / week    PT Duration 6 weeks    PT Treatment/Interventions ADLs/Self Care Home Management;Aquatic Therapy;Cryotherapy;Electrical Stimulation;DME Instruction;Moist Heat;Gait training;Stair training;Functional mobility training;Therapeutic activities;Therapeutic exercise;Balance training;Patient/family education;Neuromuscular re-education;Wheelchair mobility training;Manual techniques;Passive range of motion;Taping;Energy conservation;Spinal Manipulations;Joint Manipulations    PT Next Visit Plan Continue with TKR rehab protocol; 10th visit next session.    PT Home Exercise Plan QS, heel slides, knee flexion drive on 6" stair; stand heel/toe raise; 10/16/20 - LAQ, seated heel slide. Seated OKC PRE with red t-loop; 3/17-22 - self scar massage    Consulted and Agree with Plan of Care Patient           Patient will benefit from skilled therapeutic intervention in order to improve the following deficits and impairments:  Abnormal gait,Decreased activity tolerance,Decreased balance,Decreased mobility,Decreased endurance,Decreased range of motion,Decreased strength,Increased edema,Difficulty walking,Impaired perceived functional ability,Improper body mechanics,Pain  Visit Diagnosis: Chronic pain of left knee  Muscle weakness (generalized)  Localized edema     Problem List Patient Active Problem List   Diagnosis Date Noted  . Osteoarthritis of left knee 10/04/2020  . History of IBS 01/21/2020  . Chronic diarrhea 10/25/2019  . Back pain 07/26/2019  . Spinal stenosis 07/26/2019  . Polymyositis ([place]) 07/26/2019  . Pain in left knee 07/21/2018  . Anxiety and depression 12/30/2015  . Impaired mobility and activities of daily living 12/30/2015  . 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 Bilateral leg weakness 01/24/2015  . Elevated CPK 01/24/2015  . Transaminitis 10/24/2014   8:59 AM, 10/30/20 M. Sherlyn Lees, PT, DPT Physical Therapist- [place] Office  Number: [phone]  [place] [address] [place], [place], [address] Phone: [phone]   Fax:  [phone]  Name: Jacob Hunter MRN: [id number] Date of Birth: 1955-12-21

## 2020-11-01 ENCOUNTER — Encounter (HOSPITAL_COMMUNITY): Payer: PPO

## 2020-11-01 DIAGNOSIS — G7289 Other specified myopathies: Secondary | ICD-10-CM | POA: Diagnosis not present

## 2020-11-02 ENCOUNTER — Encounter (HOSPITAL_COMMUNITY): Payer: Self-pay | Admitting: Physical Therapy

## 2020-11-02 ENCOUNTER — Other Ambulatory Visit: Payer: Self-pay

## 2020-11-02 ENCOUNTER — Ambulatory Visit (HOSPITAL_COMMUNITY): Payer: PPO | Admitting: Physical Therapy

## 2020-11-02 DIAGNOSIS — M6281 Muscle weakness (generalized): Secondary | ICD-10-CM

## 2020-11-02 DIAGNOSIS — R600 Localized edema: Secondary | ICD-10-CM

## 2020-11-02 DIAGNOSIS — G8929 Other chronic pain: Secondary | ICD-10-CM

## 2020-11-02 DIAGNOSIS — M25562 Pain in left knee: Secondary | ICD-10-CM | POA: Diagnosis not present

## 2020-11-02 NOTE — Patient Instructions (Signed)
Access Code: [facility] URL: [email or web address] Date: 11/02/2020 Prepared by: Mitzi Hansen Ernest Orr  Exercises Step Up - 1 x daily - 7 x weekly - 2 sets - 10 reps Lateral Step Up - 1 x daily - 7 x weekly - 2 sets - 10 reps

## 2020-11-02 NOTE — Therapy (Signed)
[place], [place], [address] Phone: [phone]   Fax:  [phone]  Physical Therapy Treatment  Patient Details  Name: Jacob Hunter MRN: [id number] Date of Birth: 01-10-1956 Referring Provider (PT): Dr. Sydnee Cabal, MD   Encounter Date: 11/02/2020   PT End of Session - 11/02/20 0958    Visit Number 11    Number of Visits 18    Date for PT Re-Evaluation 11/17/20    Authorization Type [employer], no VL, no auth    Progress Note Due on Visit 20    PT Start Time 0959    PT Stop Time 1039    PT Time Calculation (min) 40 min    Activity Tolerance Patient tolerated treatment well;Patient limited by pain    Behavior During Therapy [facility] for tasks assessed/performed           Past Medical History:  Diagnosis Date  . Arthritis   . Knee pain   . Necrotizing myopathy   . Pneumonia   . Polymyositis [facility])     Past Surgical History:  Procedure Laterality Date  . ANKLE SURGERY Left   . COLONOSCOPY N/A 10/05/2019   Rourk: 3 colonic polyps removed, 2 were tubular adenomas, cecal polyp was sessile serrated polyp.  Advised 3-year surveillance colonoscopy.  [name] EYE SURGERY     bil cataracts  . KNEE SURGERY Left    arthroscopy x 2  . left wrist surgery    . POLYPECTOMY  10/05/2019   Procedure: POLYPECTOMY;  Surgeon: Daneil Dolin, MD;  [facility];  Service: Endoscopy;;  . TOTAL KNEE ARTHROPLASTY Left 10/04/2020   Procedure: TOTAL KNEE ARTHROPLASTY;  Surgeon: Sydnee Cabal, MD;  [facility];  Service: Orthopedics;  Laterality: Left;  adductor canal    There were no vitals filed for this visit.   Subjective Assessment - 11/02/20 0959    Subjective Patient states his knee hasnt been as painful and knee has bending better. He uses a little pedal machine at home. He had an infusion yesterday.    Currently in Pain? Yes    Pain Score 4     Pain Location Knee    Pain Orientation Left     Pain Descriptors / Indicators Aching;Tightness    Pain Type Surgical pain                             [facility] Adult PT Treatment/Exercise - 11/02/20 0001      Knee/Hip Exercises: Stretches   Sports administrator Left;5 reps;30 seconds    Quad Stretch Limitations with strap    Press photographer Both;3 reps;30 seconds      Knee/Hip Exercises: Aerobic   Recumbent Bike 5 minutes dynamic warm up and ROM seat 18 to seat 16      Knee/Hip Exercises: Standing   Knee Flexion Left;10 reps;1 set    Knee Flexion Limitations 2#, 5 second holds    Lateral Step Up Left;2 sets;10 reps;Hand Hold: 2;Step Height: 6"    Forward Step Up Left;2 sets;10 reps;Hand Hold: 1;Step Height: 4";Step Height: 6"      Knee/Hip Exercises: Supine   Knee Extension AROM;Left    Knee Extension Limitations lacking 5    Knee Flexion AROM    Knee Flexion Limitations 113   improves to 115 following prone quad stretch  PT Education - 11/02/20 0959    Education Details HEP, exercise mechanics, scar massage    Person(s) Educated Patient    Methods Explanation;Demonstration;Handout    Comprehension Verbalized understanding;Returned demonstration            PT Short Term Goals - 10/30/20 0820      PT SHORT TERM GOAL #1   Title Patient will report at least 25% improvement in symptoms for improved quality of life.    Baseline 50% improvement    Time 4    Period Weeks    Status Achieved    Target Date 11/03/20      PT SHORT TERM GOAL #2   Title Patient will demonstrate left knee flexion to 90 degrees and 5 degrees lacking extension to improve gait mechanics    Baseline lacking 5 degrees extension, 105 flexion    Time 4    Period Weeks    Status Achieved    Target Date 11/03/20      PT SHORT TERM GOAL #3   Title Demo improved ambulation as evident by distance of 150 ft during 2MWT    Baseline 70 ft with RW; 300 ft with RW    Time 4    Period Weeks    Status Achieved     Target Date 11/03/20             PT Long Term Goals - 10/30/20 0827      PT LONG TERM GOAL #1   Title Patient will be independent with HEP in order to improve functional outcomes.    Time 6    Period Weeks    Status Achieved      PT LONG TERM GOAL #2   Title Patient will improve on FOTO score to meet predicted outcomes to improve functional independence    Baseline 26.4% function; 40.9% function    Time 6    Period Weeks    Status On-going      PT LONG TERM GOAL #3   Title Demo 110 degrees left knee flexion and full extension in order to facilitate normalized gait pattern and transfers    Baseline lacking 5 degrees extension, 105 flexion    Time 6    Period Weeks    Status On-going                 Plan - 11/02/20 0958    Clinical Impression Statement Patient able to complete full revolutions on bike at seat 18 after several revolutions with compensation and is able to progress to seat 16 and then 15 after several minutes at each seat position as knee ROM and stiffness improves. Patient showing improving ROM today from lacking 5 to 113 following bike. Patient educated on and shown scar mobilizations for reducing scar appearance and decreasing tissue tension. Added functional strengthening today with stair exercises which patient tolerates well. He requires HHA for balance and strength deficits. Patient begins on 4 inch step but is able to perform on 7inch step with good mechanics. Patient will continue to benefit from skilled physical therapy in order to reduce impairment and improve function.    Personal Factors and Comorbidities Comorbidity 1    Comorbidities PMH    Examination-Activity Limitations Bed Mobility;Bend;Lift;Toileting;Stand;Stairs;Squat;Locomotion Level;Transfers    Examination-Participation Restrictions Cleaning;Community Activity;Interpersonal Relationship;Yard Work;Shop;Driving    Stability/Clinical Decision Making Stable/Uncomplicated    Rehab Potential  Excellent    PT Frequency 3x / week    PT Duration 6 weeks  PT Treatment/Interventions ADLs/Self Care Home Management;Aquatic Therapy;Cryotherapy;Electrical Stimulation;DME Instruction;Moist Heat;Gait training;Stair training;Functional mobility training;Therapeutic activities;Therapeutic exercise;Balance training;Patient/family education;Neuromuscular re-education;Wheelchair mobility training;Manual techniques;Passive range of motion;Taping;Energy conservation;Spinal Manipulations;Joint Manipulations    PT Next Visit Plan Continue with TKR rehab protocol; add lunges as able and progress to standing <> floor transfers as patient wants to be able to transfer to and from floor easily    PT Home Exercise Plan QS, heel slides, knee flexion drive on 6" stair; stand heel/toe raise; 10/16/20 - LAQ, seated heel slide. Seated OKC PRE with red t-loop; 3/17-22 - self scar massage 3/24 fwd and lateral step up    Consulted and Agree with Plan of Care Patient           Patient will benefit from skilled therapeutic intervention in order to improve the following deficits and impairments:  Abnormal gait,Decreased activity tolerance,Decreased balance,Decreased mobility,Decreased endurance,Decreased range of motion,Decreased strength,Increased edema,Difficulty walking,Impaired perceived functional ability,Improper body mechanics,Pain  Visit Diagnosis: Chronic pain of left knee  Muscle weakness (generalized)  Localized edema     Problem List Patient Active Problem List   Diagnosis Date Noted  . Osteoarthritis of left knee 10/04/2020  . History of IBS 01/21/2020  . Chronic diarrhea 10/25/2019  . Back pain 07/26/2019  . Spinal stenosis 07/26/2019  . Polymyositis ([place]) 07/26/2019  . Pain in left knee 07/21/2018  . Anxiety and depression 12/30/2015  . Impaired mobility and activities of daily living 12/30/2015  . 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 Bilateral leg weakness 01/24/2015  . Elevated CPK  01/24/2015  . Transaminitis 10/24/2014    10:42 AM, 11/02/20 Mearl Latin PT, DPT Physical Therapist at [place], [place], [address] Phone: [phone]   Fax:  [phone]  Name: JARQUAVIOUS FENTRESS MRN: [id number] Date of Birth: 1956-01-27

## 2020-11-03 ENCOUNTER — Ambulatory Visit (HOSPITAL_COMMUNITY): Payer: PPO

## 2020-11-03 DIAGNOSIS — M25562 Pain in left knee: Secondary | ICD-10-CM

## 2020-11-03 DIAGNOSIS — M6281 Muscle weakness (generalized): Secondary | ICD-10-CM

## 2020-11-03 DIAGNOSIS — G8929 Other chronic pain: Secondary | ICD-10-CM

## 2020-11-03 NOTE — Therapy (Signed)
[place], [place], [address] Phone: [phone]   Fax:  [phone]  Physical Therapy Treatment  Patient Details  Name: Jacob Hunter MRN: [id number] Date of Birth: [birth year] Referring Provider (PT): Dr. Sydnee Cabal, MD   Encounter Date: 11/03/2020   PT End of Session - 11/03/20 0821    Visit Number 12    Number of Visits 18    Date for PT Re-Evaluation 11/17/20    Authorization Type [employer], no VL, no auth    Progress Note Due on Visit 20    PT Start Time 0815    PT Stop Time 0900    PT Time Calculation (min) 45 min    Activity Tolerance Patient tolerated treatment well;Patient limited by pain    Behavior During Therapy [facility] for tasks assessed/performed           Past Medical History:  Diagnosis Date  . Arthritis   . Knee pain   . Necrotizing myopathy   . Pneumonia   . Polymyositis [facility])     Past Surgical History:  Procedure Laterality Date  . ANKLE SURGERY Left   . COLONOSCOPY N/A 10/05/2019   Rourk: 3 colonic polyps removed, 2 were tubular adenomas, cecal polyp was sessile serrated polyp.  Advised 3-year surveillance colonoscopy.  [name] EYE SURGERY     bil cataracts  . KNEE SURGERY Left    arthroscopy x 2  . left wrist surgery    . POLYPECTOMY  10/05/2019   Procedure: POLYPECTOMY;  Surgeon: Daneil Dolin, MD;  [facility];  Service: Endoscopy;;  . TOTAL KNEE ARTHROPLASTY Left 10/04/2020   Procedure: TOTAL KNEE ARTHROPLASTY;  Surgeon: Sydnee Cabal, MD;  Location: [facility];  Service: Orthopedics;  Laterality: Left;  adductor canal    There were no vitals filed for this visit.   Subjective Assessment - 11/03/20 0830    Subjective Pt reports feeling sore from yesterday's therapy session but no significant complaints    Currently in Pain? Yes    Pain Score 4     Pain Location Knee    Pain Orientation Left    Pain Descriptors / Indicators Aching;Tightness    Pain  Type Surgical pain              [facility] PT Assessment - 11/03/20 0001      Assessment   Medical Diagnosis Left TKR    Referring Provider (PT) Dr. Sydnee Cabal, MD    Onset Date/Surgical Date 10/04/20                         [facility] Adult PT Treatment/Exercise - 11/03/20 0001      Knee/Hip Exercises: Stretches   Passive Hamstring Stretch Left;3 reps;30 seconds    Quad Stretch Left;4 reps;60 seconds    Quad Stretch Limitations with strap, prone      Knee/Hip Exercises: Aerobic   Nustep LE only level 6 x 6 min      Knee/Hip Exercises: Machines for Strengthening   Cybex Knee Flexion 3x10 5 plates      Knee/Hip Exercises: Standing   Knee Flexion Strengthening;Both;3 sets;10 reps    Knee Flexion Limitations 10 lbs    Hip Flexion Stengthening;Both;1 set   2 min 12" stair taps w/ 10 lbs ankle weights   Other Standing Knee Exercises sidestepping x 2 min 10 lbs ankle weights      Knee/Hip Exercises: Seated  Long [employer] Strengthening;Both;3 sets;10 reps    Long [employer] 10 lbs.    Sit to Sand 2 sets;10 reps;without UE support   with 5kg med ball chest pass     Knee/Hip Exercises: Supine   Heel Slides AAROM;Left;1 set;10 reps    Heel Slides Limitations 110    Terminal Knee Extension AROM;1 set;10 reps;Left    Terminal Knee Extension Limitations lacking 5 degrees                    PT Short Term Goals - 10/30/20 0820      PT SHORT TERM GOAL #1   Title Patient will report at least 25% improvement in symptoms for improved quality of life.    Baseline 50% improvement    Time 4    Period Weeks    Status Achieved    Target Date 11/03/20      PT SHORT TERM GOAL #2   Title Patient will demonstrate left knee flexion to 90 degrees and 5 degrees lacking extension to improve gait mechanics    Baseline lacking 5 degrees extension, 105 flexion    Time 4    Period Weeks    Status Achieved    Target Date 11/03/20      PT SHORT TERM GOAL #3    Title Demo improved ambulation as evident by distance of 150 ft during 2MWT    Baseline 70 ft with RW; 300 ft with RW    Time 4    Period Weeks    Status Achieved    Target Date 11/03/20             PT Long Term Goals - 10/30/20 0827      PT LONG TERM GOAL #1   Title Patient will be independent with HEP in order to improve functional outcomes.    Time 6    Period Weeks    Status Achieved      PT LONG TERM GOAL #2   Title Patient will improve on FOTO score to meet predicted outcomes to improve functional independence    Baseline 26.4% function; 40.9% function    Time 6    Period Weeks    Status On-going      PT LONG TERM GOAL #3   Title Demo 110 degrees left knee flexion and full extension in order to facilitate normalized gait pattern and transfers    Baseline lacking 5 degrees extension, 105 flexion    Time 6    Period Weeks    Status On-going                 Plan - 11/03/20 3662    Clinical Impression Statement tolerating tx sessions well and able to ambulate with improved pattern and now using cane vs RW and demo left knee flexion 110 degrees and lacking 5 degrees extension. Continued POC to improve gait mechanics and transfer symmetry to improve functional performance and reduce risk for falls    Personal Factors and Comorbidities Comorbidity 1    Comorbidities PMH    Examination-Activity Limitations Bed Mobility;Bend;Lift;Toileting;Stand;Stairs;Squat;Locomotion Level;Transfers    Examination-Participation Restrictions Cleaning;Community Activity;Interpersonal Relationship;Yard Work;Shop;Driving    Stability/Clinical Decision Making Stable/Uncomplicated    Rehab Potential Excellent    PT Frequency 3x / week    PT Duration 6 weeks    PT Treatment/Interventions ADLs/Self Care Home Management;Aquatic Therapy;Cryotherapy;Electrical Stimulation;DME Instruction;Moist Heat;Gait training;Stair training;Functional mobility training;Therapeutic activities;Therapeutic  exercise;Balance training;Patient/family education;Neuromuscular re-education;Wheelchair mobility training;Manual techniques;Passive range of  motion;Taping;Energy conservation;Spinal Manipulations;Joint Manipulations    PT Next Visit Plan Continue with TKR rehab protocol; add lunges as able and progress to standing <> floor transfers as patient wants to be able to transfer to and from floor easily    PT Home Exercise Plan QS, heel slides, knee flexion drive on 6" stair; stand heel/toe raise; 10/16/20 - LAQ, seated heel slide. Seated OKC PRE with red t-loop; 3/17-22 - self scar massage 3/24 fwd and lateral step up    Consulted and Agree with Plan of Care Patient           Patient will benefit from skilled therapeutic intervention in order to improve the following deficits and impairments:  Abnormal gait,Decreased activity tolerance,Decreased balance,Decreased mobility,Decreased endurance,Decreased range of motion,Decreased strength,Increased edema,Difficulty walking,Impaired perceived functional ability,Improper body mechanics,Pain  Visit Diagnosis: Chronic pain of left knee  Muscle weakness (generalized)     Problem List Patient Active Problem List   Diagnosis Date Noted  . Osteoarthritis of left knee 10/04/2020  . History of IBS 01/21/2020  . Chronic diarrhea 10/25/2019  . Back pain 07/26/2019  . Spinal stenosis 07/26/2019  . Polymyositis ([place]) 07/26/2019  . Pain in left knee 07/21/2018  . Anxiety and depression 12/30/2015  . Impaired mobility and activities of daily living 12/30/2015  . 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 Bilateral leg weakness 01/24/2015  . Elevated CPK 01/24/2015  . Transaminitis 10/24/2014   8:59 AM, 11/03/20 M. Sherlyn Lees, PT, DPT Physical Therapist- [place] Office Number: [phone]  [place] [address] [place], [place], [address] Phone: [phone]   Fax:  [phone]  Name: SAIF PETER MRN: [id number] Date of Birth: 01-Nov-1955

## 2020-11-06 ENCOUNTER — Encounter (HOSPITAL_COMMUNITY): Payer: Self-pay

## 2020-11-06 ENCOUNTER — Other Ambulatory Visit: Payer: Self-pay

## 2020-11-06 ENCOUNTER — Ambulatory Visit (HOSPITAL_COMMUNITY): Payer: PPO

## 2020-11-06 DIAGNOSIS — M6281 Muscle weakness (generalized): Secondary | ICD-10-CM

## 2020-11-06 DIAGNOSIS — G8929 Other chronic pain: Secondary | ICD-10-CM

## 2020-11-06 DIAGNOSIS — M25562 Pain in left knee: Secondary | ICD-10-CM

## 2020-11-06 NOTE — Therapy (Signed)
[place], [place], [address] Phone: [phone]   Fax:  [phone]  Physical Therapy Treatment  Patient Details  Name: Jacob Hunter MRN: [id number] Date of Birth: 12/05/1955 Referring Provider (PT): Dr. Sydnee Cabal, MD   Encounter Date: 11/06/2020   PT End of Session - 11/06/20 0815    Visit Number 13    Number of Visits 18    Date for PT Re-Evaluation 11/17/20    Authorization Type [employer], no VL, no auth    Progress Note Due on Visit 20    PT Start Time 0810    PT Stop Time 0900    PT Time Calculation (min) 50 min    Activity Tolerance Patient tolerated treatment well;Patient limited by pain    Behavior During Therapy [facility] for tasks assessed/performed           Past Medical History:  Diagnosis Date  . Arthritis   . Knee pain   . Necrotizing myopathy   . Pneumonia   . Polymyositis [facility])     Past Surgical History:  Procedure Laterality Date  . ANKLE SURGERY Left   . COLONOSCOPY N/A 10/05/2019   Rourk: 3 colonic polyps removed, 2 were tubular adenomas, cecal polyp was sessile serrated polyp.  Advised 3-year surveillance colonoscopy.  [name] EYE SURGERY     bil cataracts  . KNEE SURGERY Left    arthroscopy x 2  . left wrist surgery    . POLYPECTOMY  10/05/2019   Procedure: POLYPECTOMY;  Surgeon: Daneil Dolin, MD;  [facility];  Service: Endoscopy;;  . TOTAL KNEE ARTHROPLASTY Left 10/04/2020   Procedure: TOTAL KNEE ARTHROPLASTY;  Surgeon: Sydnee Cabal, MD;  [facility];  Service: Orthopedics;  Laterality: Left;  adductor canal    There were no vitals filed for this visit.   Subjective Assessment - 11/06/20 0815    Subjective Pt reports feeling sore from exercising over the weekend and notes left lateral knee discomfort    Currently in Pain? Yes    Pain Score 4     Pain Location Knee    Pain Orientation Left    Pain Descriptors / Indicators Aching;Tightness     Pain Type Surgical pain              [facility] PT Assessment - 11/06/20 0001      Assessment   Medical Diagnosis Left TKR    Referring Provider (PT) Dr. Sydnee Cabal, MD    Onset Date/Surgical Date 10/04/20    Next MD Visit 11/06/20                         [facility] Adult PT Treatment/Exercise - 11/06/20 0001      Knee/Hip Exercises: Aerobic   Tread Mill 2x2 min reverse at 0.6 mph, 2x2 min 1 mph at 8% incline    Recumbent Bike 5 minutes dynamic warm up and ROM seat 18 to seat 16      Knee/Hip Exercises: Machines for Strengthening   Cybex Knee Flexion 8 plates, 3x10   RPE 0-10: rates at 7     Knee/Hip Exercises: Standing   SLS high step march 1x10. Stair lift-offs 1x10 from 6" step      Knee/Hip Exercises: Seated   Sit to Sand 3 sets;10 reps   holding 10 lbs dumbells in front  PT Education - 11/06/20 0845    Education Details education on RPE to scale exercise intensity    Person(s) Educated Patient    Methods Explanation;Demonstration    Comprehension Verbalized understanding;Returned demonstration            PT Short Term Goals - 10/30/20 0820      PT SHORT TERM GOAL #1   Title Patient will report at least 25% improvement in symptoms for improved quality of life.    Baseline 50% improvement    Time 4    Period Weeks    Status Achieved    Target Date 11/03/20      PT SHORT TERM GOAL #2   Title Patient will demonstrate left knee flexion to 90 degrees and 5 degrees lacking extension to improve gait mechanics    Baseline lacking 5 degrees extension, 105 flexion    Time 4    Period Weeks    Status Achieved    Target Date 11/03/20      PT SHORT TERM GOAL #3   Title Demo improved ambulation as evident by distance of 150 ft during 2MWT    Baseline 70 ft with RW; 300 ft with RW    Time 4    Period Weeks    Status Achieved    Target Date 11/03/20             PT Long Term Goals - 10/30/20 0827      PT LONG TERM GOAL  #1   Title Patient will be independent with HEP in order to improve functional outcomes.    Time 6    Period Weeks    Status Achieved      PT LONG TERM GOAL #2   Title Patient will improve on FOTO score to meet predicted outcomes to improve functional independence    Baseline 26.4% function; 40.9% function    Time 6    Period Weeks    Status On-going      PT LONG TERM GOAL #3   Title Demo 110 degrees left knee flexion and full extension in order to facilitate normalized gait pattern and transfers    Baseline lacking 5 degrees extension, 105 flexion    Time 6    Period Weeks    Status On-going                 Plan - 11/06/20 0848    Clinical Impression Statement Pt tolerating tx sessions well and able to progress to marked increase in resistance ith PRE and performing bodyweight with added resistance demonstrating good body mechanics with occasional cues for form. Difficulty with single limb support LLE in single leg stance position. Pt experienced episode of dizziness and feeling unwell at end of session with notable shaking in hands. BP taken and reveals 140/85 mmHg and HR 103 bpm. Pt positioned to supine and able to recover after several minutes    Personal Factors and Comorbidities Comorbidity 1    Comorbidities PMH    Examination-Activity Limitations Bed Mobility;Bend;Lift;Toileting;Stand;Stairs;Squat;Locomotion Level;Transfers    Examination-Participation Restrictions Cleaning;Community Activity;Interpersonal Relationship;Yard Work;Shop;Driving    Stability/Clinical Decision Making Stable/Uncomplicated    Rehab Potential Excellent    PT Frequency 3x / week    PT Duration 6 weeks    PT Treatment/Interventions ADLs/Self Care Home Management;Aquatic Therapy;Cryotherapy;Electrical Stimulation;DME Instruction;Moist Heat;Gait training;Stair training;Functional mobility training;Therapeutic activities;Therapeutic exercise;Balance training;Patient/family education;Neuromuscular  re-education;Wheelchair mobility training;Manual techniques;Passive range of motion;Taping;Energy conservation;Spinal Manipulations;Joint Manipulations    PT Next Visit Plan Continue with TKR  rehab protocol; add lunges as able and progress to standing <> floor transfers as patient wants to be able to transfer to and from floor easily    PT Home Exercise Plan QS, heel slides, knee flexion drive on 6" stair; stand heel/toe raise; 10/16/20 - LAQ, seated heel slide. Seated OKC PRE with red t-loop; 3/17-22 - self scar massage 3/24 fwd and lateral step up    Consulted and Agree with Plan of Care Patient           Patient will benefit from skilled therapeutic intervention in order to improve the following deficits and impairments:  Abnormal gait,Decreased activity tolerance,Decreased balance,Decreased mobility,Decreased endurance,Decreased range of motion,Decreased strength,Increased edema,Difficulty walking,Impaired perceived functional ability,Improper body mechanics,Pain  Visit Diagnosis: Chronic pain of left knee  Muscle weakness (generalized)     Problem List Patient Active Problem List   Diagnosis Date Noted  . Osteoarthritis of left knee 10/04/2020  . History of IBS 01/21/2020  . Chronic diarrhea 10/25/2019  . Back pain 07/26/2019  . Spinal stenosis 07/26/2019  . Polymyositis ([place]) 07/26/2019  . Pain in left knee 07/21/2018  . Anxiety and depression 12/30/2015  . Impaired mobility and activities of daily living 12/30/2015  . 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 Bilateral leg weakness 01/24/2015  . Elevated CPK 01/24/2015  . Transaminitis 10/24/2014   9:05 AM, 11/06/20 M. Sherlyn Lees, PT, DPT Physical Therapist- [place] Office Number: [phone]  [place] [address] [place], [place], [address] Phone: [phone]   Fax:  [phone]  Name: Jacob Hunter MRN: [id number] Date of Birth: [birth year]

## 2020-11-07 DIAGNOSIS — G7281 Critical illness myopathy: Secondary | ICD-10-CM | POA: Diagnosis not present

## 2020-11-08 ENCOUNTER — Encounter (HOSPITAL_COMMUNITY): Payer: PPO

## 2020-11-08 DIAGNOSIS — G7281 Critical illness myopathy: Secondary | ICD-10-CM | POA: Diagnosis not present

## 2020-11-09 ENCOUNTER — Ambulatory Visit (HOSPITAL_COMMUNITY): Payer: PPO | Admitting: Physical Therapy

## 2020-11-09 ENCOUNTER — Other Ambulatory Visit: Payer: Self-pay

## 2020-11-09 DIAGNOSIS — R600 Localized edema: Secondary | ICD-10-CM

## 2020-11-09 DIAGNOSIS — M25562 Pain in left knee: Secondary | ICD-10-CM

## 2020-11-09 DIAGNOSIS — M6281 Muscle weakness (generalized): Secondary | ICD-10-CM

## 2020-11-09 DIAGNOSIS — G8929 Other chronic pain: Secondary | ICD-10-CM

## 2020-11-09 DIAGNOSIS — R29898 Other symptoms and signs involving the musculoskeletal system: Secondary | ICD-10-CM

## 2020-11-09 NOTE — Therapy (Signed)
[place], [place], [address] Phone: [phone]   Fax:  [phone]  Physical Therapy Treatment  Patient Details  Name: Jacob Hunter MRN: [id number] Date of Birth: 03/22/1956 Referring Provider (PT): Dr. Sydnee Cabal, MD   Encounter Date: 11/09/2020    Past Medical History:  Diagnosis Date  . Arthritis   . Knee pain   . Necrotizing myopathy   . Pneumonia   . Polymyositis [facility])     Past Surgical History:  Procedure Laterality Date  . ANKLE SURGERY Left   . COLONOSCOPY N/A 10/05/2019   Rourk: 3 colonic polyps removed, 2 were tubular adenomas, cecal polyp was sessile serrated polyp.  Advised 3-year surveillance colonoscopy.  [name] EYE SURGERY     bil cataracts  . KNEE SURGERY Left    arthroscopy x 2  . left wrist surgery    . POLYPECTOMY  10/05/2019   Procedure: POLYPECTOMY;  Surgeon: Daneil Dolin, MD;  [facility];  Service: Endoscopy;;  . TOTAL KNEE ARTHROPLASTY Left 10/04/2020   Procedure: TOTAL KNEE ARTHROPLASTY;  Surgeon: Sydnee Cabal, MD;  [facility];  Service: Orthopedics;  Laterality: Left;  adductor canal    There were no vitals filed for this visit.   Subjective Assessment - 11/09/20 1112    Subjective Minimal reports of pain, more tightness than anything.  continues to moisturize and massage his scar.    Currently in Pain? Yes    Pain Score 2     Pain Location Knee    Pain Orientation Left    Pain Descriptors / Indicators Aching;Tightness    Pain Type Surgical pain                             [facility] Adult PT Treatment/Exercise - 11/09/20 0001      Knee/Hip Exercises: Aerobic   Recumbent Bike 5 minutes dynamic warm up and ROM seat 16      Knee/Hip Exercises: Machines for Strengthening   Cybex Knee Flexion 8 plates, 3x10    Cybex Leg Press 6 plates 3X10      Knee/Hip Exercises: Standing   Lateral Step Up Left;10 reps;Step Height: 6";3  sets;Hand Hold: 1    Lateral Step Up Limitations eccentric lowering    Forward Step Up Left;10 reps;Hand Hold: 1;Step Height: 6";1 set    Forward Step Up Limitations with power up    SLS with Vectors 10X5" with 1 HHA lt only    Gait Training without AD and cues      Knee/Hip Exercises: Seated   Sit to Sand 3 sets;10 reps                  PT Education - 11/09/20 1109    Education Details continue with current HEP with stabilization/single leg focus.  continue scar massage and mobilization.    Person(s) Educated Patient    Methods Explanation    Comprehension Verbalized understanding            PT Short Term Goals - 10/30/20 0820      PT SHORT TERM GOAL #1   Title Patient will report at least 25% improvement in symptoms for improved quality of life.    Baseline 50% improvement    Time 4    Period Weeks    Status Achieved    Target Date 11/03/20      PT SHORT TERM GOAL #2  Title Patient will demonstrate left knee flexion to 90 degrees and 5 degrees lacking extension to improve gait mechanics    Baseline lacking 5 degrees extension, 105 flexion    Time 4    Period Weeks    Status Achieved    Target Date 11/03/20      PT SHORT TERM GOAL #3   Title Demo improved ambulation as evident by distance of 150 ft during 2MWT    Baseline 70 ft with RW; 300 ft with RW    Time 4    Period Weeks    Status Achieved    Target Date 11/03/20             PT Long Term Goals - 10/30/20 0827      PT LONG TERM GOAL #1   Title Patient will be independent with HEP in order to improve functional outcomes.    Time 6    Period Weeks    Status Achieved      PT LONG TERM GOAL #2   Title Patient will improve on FOTO score to meet predicted outcomes to improve functional independence    Baseline 26.4% function; 40.9% function    Time 6    Period Weeks    Status On-going      PT LONG TERM GOAL #3   Title Demo 110 degrees left knee flexion and full extension in order to  facilitate normalized gait pattern and transfers    Baseline lacking 5 degrees extension, 105 flexion    Time 6    Period Weeks    Status On-going                 Plan - 11/09/20 1107    Clinical Impression Statement Began session on bike for dynamic warm up.  Focused on improving LE strength and stability.  Added vectors to improve single leg stance with cues for posturing.  Noted challenge with lateral and posterior LE movements.  Worked on step ups (eccentric control) and ambulation without AD.  Verbal cues for heel-toe gait and trunk stabilization.  ROM measured today with AROM of 115 degrees flexion.  No dizziness or episodes this session.  Pt is overall improving.    Personal Factors and Comorbidities Comorbidity 1    Comorbidities PMH    Examination-Activity Limitations Bed Mobility;Bend;Lift;Toileting;Stand;Stairs;Squat;Locomotion Level;Transfers    Examination-Participation Restrictions Cleaning;Community Activity;Interpersonal Relationship;Yard Work;Shop;Driving    Stability/Clinical Decision Making Stable/Uncomplicated    Rehab Potential Excellent    PT Frequency 3x / week    PT Duration 6 weeks    PT Treatment/Interventions ADLs/Self Care Home Management;Aquatic Therapy;Cryotherapy;Electrical Stimulation;DME Instruction;Moist Heat;Gait training;Stair training;Functional mobility training;Therapeutic activities;Therapeutic exercise;Balance training;Patient/family education;Neuromuscular re-education;Wheelchair mobility training;Manual techniques;Passive range of motion;Taping;Energy conservation;Spinal Manipulations;Joint Manipulations    PT Next Visit Plan Continue with TKR rehab protocol.  add lunges as able and progress to standing <> floor transfers as patient wants to be able to transfer to and from floor easily.  add reciprocal stairs as well.    PT Home Exercise Plan QS, heel slides, knee flexion drive on 6" stair; stand heel/toe raise; 10/16/20 - LAQ, seated heel slide.  Seated OKC PRE with red t-loop; 3/17-22 - self scar massage 3/24 fwd and lateral step up    Consulted and Agree with Plan of Care Patient           Patient will benefit from skilled therapeutic intervention in order to improve the following deficits and impairments:  Abnormal gait,Decreased activity tolerance,Decreased balance,Decreased mobility,Decreased endurance,Decreased  range of motion,Decreased strength,Increased edema,Difficulty walking,Impaired perceived functional ability,Improper body mechanics,Pain  Visit Diagnosis: Chronic pain of left knee  Muscle weakness (generalized)  Localized edema  Other symptoms and signs involving the musculoskeletal system     Problem List Patient Active Problem List   Diagnosis Date Noted  . Osteoarthritis of left knee 10/04/2020  . History of IBS 01/21/2020  . Chronic diarrhea 10/25/2019  . Back pain 07/26/2019  . Spinal stenosis 07/26/2019  . Polymyositis ([place]) 07/26/2019  . Pain in left knee 07/21/2018  . Anxiety and depression 12/30/2015  . Impaired mobility and activities of daily living 12/30/2015  . 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 Bilateral leg weakness 01/24/2015  . Elevated CPK 01/24/2015  . Transaminitis 10/24/2014   Teena Irani, PTA/CLT [phone]  Teena Irani 11/09/2020, 11:14 AM  [place], [place], [address] Phone: [phone]   Fax:  [phone]  Name: Jacob Hunter MRN: [id number] Date of Birth: November 04, 1955

## 2020-11-10 ENCOUNTER — Encounter (HOSPITAL_COMMUNITY): Payer: Self-pay

## 2020-11-10 ENCOUNTER — Ambulatory Visit (HOSPITAL_COMMUNITY): Payer: PPO | Attending: Physician Assistant

## 2020-11-10 DIAGNOSIS — R29898 Other symptoms and signs involving the musculoskeletal system: Secondary | ICD-10-CM | POA: Diagnosis not present

## 2020-11-10 DIAGNOSIS — M6281 Muscle weakness (generalized): Secondary | ICD-10-CM | POA: Diagnosis not present

## 2020-11-10 DIAGNOSIS — G8929 Other chronic pain: Secondary | ICD-10-CM | POA: Diagnosis not present

## 2020-11-10 DIAGNOSIS — R600 Localized edema: Secondary | ICD-10-CM | POA: Insufficient documentation

## 2020-11-10 DIAGNOSIS — M25562 Pain in left knee: Secondary | ICD-10-CM | POA: Insufficient documentation

## 2020-11-10 NOTE — Therapy (Signed)
[place], [place], [address] Phone: [phone]   Fax:  [phone]  Physical Therapy Treatment  Patient Details  Name: Jacob Hunter MRN: [id number] Date of Birth: 02-Aug-1956 Referring Provider (PT): Dr. Sydnee Cabal, MD   Encounter Date: 11/10/2020   PT End of Session - 11/10/20 0817    Visit Number 14    Number of Visits 18    Date for PT Re-Evaluation 11/17/20    Authorization Type [employer], no VL, no auth    Progress Note Due on Visit 20    PT Start Time 0815    PT Stop Time 0900    PT Time Calculation (min) 45 min    Activity Tolerance Patient tolerated treatment well;Patient limited by pain    Behavior During Therapy [facility] for tasks assessed/performed           Past Medical History:  Diagnosis Date  . Arthritis   . Knee pain   . Necrotizing myopathy   . Pneumonia   . Polymyositis [facility])     Past Surgical History:  Procedure Laterality Date  . ANKLE SURGERY Left   . COLONOSCOPY N/A 10/05/2019   Rourk: 3 colonic polyps removed, 2 were tubular adenomas, cecal polyp was sessile serrated polyp.  Advised 3-year surveillance colonoscopy.  [name] EYE SURGERY     bil cataracts  . KNEE SURGERY Left    arthroscopy x 2  . left wrist surgery    . POLYPECTOMY  10/05/2019   Procedure: POLYPECTOMY;  Surgeon: Daneil Dolin, MD;  [facility];  Service: Endoscopy;;  . TOTAL KNEE ARTHROPLASTY Left 10/04/2020   Procedure: TOTAL KNEE ARTHROPLASTY;  Surgeon: Sydnee Cabal, MD;  [facility];  Service: Orthopedics;  Laterality: Left;  adductor canal    There were no vitals filed for this visit.   Subjective Assessment - 11/10/20 0850    Subjective Pt reports some muscle soreness from ther ex yesterday but overall feels fine    Currently in Pain? Yes    Pain Score 2     Pain Location Knee    Pain Orientation Left    Pain Descriptors / Indicators Aching              [facility] PT  Assessment - 11/10/20 0001      Assessment   Medical Diagnosis Left TKR    Referring Provider (PT) Dr. Sydnee Cabal, MD    Onset Date/Surgical Date 10/04/20                         [facility] Adult PT Treatment/Exercise - 11/10/20 0001      Knee/Hip Exercises: Aerobic   Recumbent Bike 5 minutes dynamic warm up and ROM seat 16      Knee/Hip Exercises: Machines for Strengthening   Cybex Knee Flexion 8 plates, 3x10      Knee/Hip Exercises: Standing   Knee Flexion Strengthening;Both;3 sets;10 reps    Knee Flexion Limitations 10 lbs    Hip Flexion Stengthening;Both;1 set    Functional Squat 2 sets;10 reps    Functional Squat Limitations 22" seat height goblet squat    Gait Training static standing on airex pad 3x30 sec and 3x30 sec eyes closed for proprioception    Other Standing Knee Exercises "freeze frame" gait cycle to improve heel strike and minimize left lateral trunk lean    Other Standing Knee Exercises sidestepping x 2 min 10 lbs  ankle weights      Knee/Hip Exercises: Seated   Long Arc Quad Strengthening;Both;3 sets;10 reps    Long Arc Quad Weight 10 lbs.                  PT Education - 11/10/20 0851    Education Details Discussion regarding D/C to HEP next week    Person(s) Educated Patient    Methods Explanation    Comprehension Verbalized understanding            PT Short Term Goals - 10/30/20 0820      PT SHORT TERM GOAL #1   Title Patient will report at least 25% improvement in symptoms for improved quality of life.    Baseline 50% improvement    Time 4    Period Weeks    Status Achieved    Target Date 11/03/20      PT SHORT TERM GOAL #2   Title Patient will demonstrate left knee flexion to 90 degrees and 5 degrees lacking extension to improve gait mechanics    Baseline lacking 5 degrees extension, 105 flexion    Time 4    Period Weeks    Status Achieved    Target Date 11/03/20      PT SHORT TERM GOAL #3   Title Demo improved  ambulation as evident by distance of 150 ft during 2MWT    Baseline 70 ft with RW; 300 ft with RW    Time 4    Period Weeks    Status Achieved    Target Date 11/03/20             PT Long Term Goals - 10/30/20 0827      PT LONG TERM GOAL #1   Title Patient will be independent with HEP in order to improve functional outcomes.    Time 6    Period Weeks    Status Achieved      PT LONG TERM GOAL #2   Title Patient will improve on FOTO score to meet predicted outcomes to improve functional independence    Baseline 26.4% function; 40.9% function    Time 6    Period Weeks    Status On-going      PT LONG TERM GOAL #3   Title Demo 110 degrees left knee flexion and full extension in order to facilitate normalized gait pattern and transfers    Baseline lacking 5 degrees extension, 105 flexion    Time 6    Period Weeks    Status On-going                 Plan - 11/10/20 0847    Clinical Impression Statement Demo improved gait mechanics after "freeze frame" drill to minimize left lateral trunk displacement in stance phase. Demo good performance and carryover of fall/floor recovery and able to complete without UE support on external objects. Plan for D/C to HEP after 11/16/20    Personal Factors and Comorbidities Comorbidity 1    Comorbidities PMH    Examination-Activity Limitations Bed Mobility;Bend;Lift;Toileting;Stand;Stairs;Squat;Locomotion Level;Transfers    Examination-Participation Restrictions Cleaning;Community Activity;Interpersonal Relationship;Yard Work;Shop;Driving    Stability/Clinical Decision Making Stable/Uncomplicated    Rehab Potential Excellent    PT Frequency 3x / week    PT Duration 6 weeks    PT Treatment/Interventions ADLs/Self Care Home Management;Aquatic Therapy;Cryotherapy;Electrical Stimulation;DME Instruction;Moist Heat;Gait training;Stair training;Functional mobility training;Therapeutic activities;Therapeutic exercise;Balance training;Patient/family  education;Neuromuscular re-education;Wheelchair mobility training;Manual techniques;Passive range of motion;Taping;Energy conservation;Spinal Manipulations;Joint Manipulations    PT Next  Visit Plan Continue with TKR rehab protocol.  add lunges as able and progress to standing <> floor transfers as patient wants to be able to transfer to and from floor easily.  add reciprocal stairs as well.    PT Home Exercise Plan QS, heel slides, knee flexion drive on 6" stair; stand heel/toe raise; 10/16/20 - LAQ, seated heel slide. Seated OKC PRE with red t-loop; 3/17-22 - self scar massage 3/24 fwd and lateral step up    Consulted and Agree with Plan of Care Patient           Patient will benefit from skilled therapeutic intervention in order to improve the following deficits and impairments:  Abnormal gait,Decreased activity tolerance,Decreased balance,Decreased mobility,Decreased endurance,Decreased range of motion,Decreased strength,Increased edema,Difficulty walking,Impaired perceived functional ability,Improper body mechanics,Pain  Visit Diagnosis: Chronic pain of left knee  Muscle weakness (generalized)  Localized edema  Other symptoms and signs involving the musculoskeletal system     Problem List Patient Active Problem List   Diagnosis Date Noted  . Osteoarthritis of left knee 10/04/2020  . History of IBS 01/21/2020  . Chronic diarrhea 10/25/2019  . Back pain 07/26/2019  . Spinal stenosis 07/26/2019  . Polymyositis ([place]) 07/26/2019  . Pain in left knee 07/21/2018  . Anxiety and depression 12/30/2015  . Impaired mobility and activities of daily living 12/30/2015  . 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 Bilateral leg weakness 01/24/2015  . Elevated CPK 01/24/2015  . Transaminitis 10/24/2014   8:58 AM, 11/10/20 M. Sherlyn Lees, PT, DPT Physical Therapist- [place] Office Number: [phone]  [place] [address] [place], [place],  [address] Phone: [phone]   Fax:  [phone]  Name: Jacob Hunter MRN: [id number] Date of Birth: Dec 01, 1955

## 2020-11-13 ENCOUNTER — Encounter (HOSPITAL_COMMUNITY): Payer: Self-pay

## 2020-11-13 ENCOUNTER — Other Ambulatory Visit: Payer: Self-pay

## 2020-11-13 ENCOUNTER — Ambulatory Visit (HOSPITAL_COMMUNITY): Payer: PPO

## 2020-11-13 DIAGNOSIS — M6281 Muscle weakness (generalized): Secondary | ICD-10-CM

## 2020-11-13 DIAGNOSIS — M25562 Pain in left knee: Secondary | ICD-10-CM | POA: Diagnosis not present

## 2020-11-13 DIAGNOSIS — G8929 Other chronic pain: Secondary | ICD-10-CM

## 2020-11-13 DIAGNOSIS — R600 Localized edema: Secondary | ICD-10-CM

## 2020-11-13 DIAGNOSIS — R29898 Other symptoms and signs involving the musculoskeletal system: Secondary | ICD-10-CM

## 2020-11-13 NOTE — Therapy (Signed)
[place], [place], [address] Phone: [phone]   Fax:  [phone]  Physical Therapy Treatment  Patient Details  Name: Jacob Hunter MRN: [id number] Date of Birth: 1956-08-03 Referring Provider (PT): Dr. Sydnee Cabal, MD   Encounter Date: 11/13/2020   PT End of Session - 11/13/20 1116    Visit Number 15    Number of Visits 18    Date for PT Re-Evaluation 11/17/20    Authorization Type [employer], no VL, no auth    Progress Note Due on Visit 20    PT Start Time 1112    PT Stop Time 1200    PT Time Calculation (min) 48 min    Activity Tolerance Patient tolerated treatment well;Patient limited by pain    Behavior During Therapy [facility] for tasks assessed/performed           Past Medical History:  Diagnosis Date  . Arthritis   . Knee pain   . Necrotizing myopathy   . Pneumonia   . Polymyositis [facility])     Past Surgical History:  Procedure Laterality Date  . ANKLE SURGERY Left   . COLONOSCOPY N/A 10/05/2019   Rourk: 3 colonic polyps removed, 2 were tubular adenomas, cecal polyp was sessile serrated polyp.  Advised 3-year surveillance colonoscopy.  [name] EYE SURGERY     bil cataracts  . KNEE SURGERY Left    arthroscopy x 2  . left wrist surgery    . POLYPECTOMY  10/05/2019   Procedure: POLYPECTOMY;  Surgeon: Daneil Dolin, MD;  [facility];  Service: Endoscopy;;  . TOTAL KNEE ARTHROPLASTY Left 10/04/2020   Procedure: TOTAL KNEE ARTHROPLASTY;  Surgeon: Sydnee Cabal, MD;  [facility];  Service: Orthopedics;  Laterality: Left;  adductor canal    There were no vitals filed for this visit.   Subjective Assessment - 11/13/20 1115    Subjective reports 2 hour shopping trip over the weekend which left him feeling very fatigued and increased knee soreness    Currently in Pain? Yes    Pain Score 4     Pain Location Knee    Pain Orientation Left    Pain Descriptors / Indicators  Aching    Pain Type Surgical pain    Pain Relieving Factors ice, elevation, recumbent bike              [facility] PT Assessment - 11/13/20 0001      Assessment   Medical Diagnosis Left TKR    Referring Provider (PT) Dr. Sydnee Cabal, MD    Onset Date/Surgical Date 10/04/20                         [facility] Adult PT Treatment/Exercise - 11/13/20 0001      Knee/Hip Exercises: Stretches   Passive Hamstring Stretch Left;2 reps;60 seconds    Quad Stretch Left;2 reps;60 seconds    Gastroc Stretch Left;2 reps;60 seconds    Gastroc Stretch Limitations long-sitting with sheet      Knee/Hip Exercises: Aerobic   Recumbent Bike 5 minutes dynamic warm up and ROM seat 16      Knee/Hip Exercises: Machines for Strengthening   Cybex Knee Flexion 8 plates, 3x10      Knee/Hip Exercises: Standing   Knee Flexion Strengthening;Both;3 sets;10 reps    Knee Flexion Limitations 10 lbs    Hip Flexion Stengthening;2 sets   2 min stair taps 12" step  Other Standing Knee Exercises sidestepping x 2 min 10 lbs ankle weights      Knee/Hip Exercises: Seated   Long Arc Quad Strengthening;Both;3 sets;10 reps    Long Arc Quad Weight 10 lbs.    Sit to Sand 3 sets;10 reps   20 lbs front squat     Knee/Hip Exercises: Supine   Knee Extension AROM;Left    Knee Extension Limitations lacking 3-4    Knee Flexion AROM    Knee Flexion Limitations 119                  PT Education - 11/13/20 1148    Education Details Discussion regarding POC details and continued mindfullness for ambulation practices to reduce left lateral lean in stance    Person(s) Educated Patient    Methods Explanation;Demonstration    Comprehension Verbalized understanding;Returned demonstration            PT Short Term Goals - 10/30/20 0820      PT SHORT TERM GOAL #1   Title Patient will report at least 25% improvement in symptoms for improved quality of life.    Baseline 50% improvement    Time 4    Period  Weeks    Status Achieved    Target Date 11/03/20      PT SHORT TERM GOAL #2   Title Patient will demonstrate left knee flexion to 90 degrees and 5 degrees lacking extension to improve gait mechanics    Baseline lacking 5 degrees extension, 105 flexion    Time 4    Period Weeks    Status Achieved    Target Date 11/03/20      PT SHORT TERM GOAL #3   Title Demo improved ambulation as evident by distance of 150 ft during 2MWT    Baseline 70 ft with RW; 300 ft with RW    Time 4    Period Weeks    Status Achieved    Target Date 11/03/20             PT Long Term Goals - 10/30/20 0827      PT LONG TERM GOAL #1   Title Patient will be independent with HEP in order to improve functional outcomes.    Time 6    Period Weeks    Status Achieved      PT LONG TERM GOAL #2   Title Patient will improve on FOTO score to meet predicted outcomes to improve functional independence    Baseline 26.4% function; 40.9% function    Time 6    Period Weeks    Status On-going      PT LONG TERM GOAL #3   Title Demo 110 degrees left knee flexion and full extension in order to facilitate normalized gait pattern and transfers    Baseline lacking 5 degrees extension, 105 flexion    Time 6    Period Weeks    Status On-going                 Plan - 11/13/20 1150    Clinical Impression Statement Progressing with POC details and demonstrating independence and proficiency with HEP activities and has been able to increase time/tolerance for ambulation in the community. Demo improved knee ROM and activity tolerance with decreased need for rest periods    Personal Factors and Comorbidities Comorbidity 1    Comorbidities PMH    Examination-Activity Limitations Bed Mobility;Bend;Lift;Toileting;Stand;Stairs;Squat;Locomotion Level;Transfers    Examination-Participation Restrictions Cleaning;Community Activity;Interpersonal Relationship;Yard Work;Shop;Driving  Stability/Clinical Decision Making  Stable/Uncomplicated    Rehab Potential Excellent    PT Frequency 3x / week    PT Duration 6 weeks    PT Treatment/Interventions ADLs/Self Care Home Management;Aquatic Therapy;Cryotherapy;Electrical Stimulation;DME Instruction;Moist Heat;Gait training;Stair training;Functional mobility training;Therapeutic activities;Therapeutic exercise;Balance training;Patient/family education;Neuromuscular re-education;Wheelchair mobility training;Manual techniques;Passive range of motion;Taping;Energy conservation;Spinal Manipulations;Joint Manipulations    PT Next Visit Plan Continue with TKR rehab protocol.  add lunges as able and progress to standing <> floor transfers as patient wants to be able to transfer to and from floor easily.  add reciprocal stairs as well.    PT Home Exercise Plan QS, heel slides, knee flexion drive on 6" stair; stand heel/toe raise; 10/16/20 - LAQ, seated heel slide. Seated OKC PRE with red t-loop; 3/17-22 - self scar massage 3/24 fwd and lateral step up    Consulted and Agree with Plan of Care Patient           Patient will benefit from skilled therapeutic intervention in order to improve the following deficits and impairments:  Abnormal gait,Decreased activity tolerance,Decreased balance,Decreased mobility,Decreased endurance,Decreased range of motion,Decreased strength,Increased edema,Difficulty walking,Impaired perceived functional ability,Improper body mechanics,Pain  Visit Diagnosis: Chronic pain of left knee  Muscle weakness (generalized)  Localized edema  Other symptoms and signs involving the musculoskeletal system     Problem List Patient Active Problem List   Diagnosis Date Noted  . Osteoarthritis of left knee 10/04/2020  . History of IBS 01/21/2020  . Chronic diarrhea 10/25/2019  . Back pain 07/26/2019  . Spinal stenosis 07/26/2019  . Polymyositis ([place]) 07/26/2019  . Pain in left knee 07/21/2018  . Anxiety and depression 12/30/2015  . Impaired mobility  and activities of daily living 12/30/2015  . 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 Bilateral leg weakness 01/24/2015  . Elevated CPK 01/24/2015  . Transaminitis 10/24/2014   12:04 PM, 11/13/20 M. Sherlyn Lees, PT, DPT Physical Therapist- [place] Office Number: [phone]  [place] [address] [place], [place], [address] Phone: [phone]   Fax:  [phone]  Name: Jacob Hunter MRN: [id number] Date of Birth: 1955-09-29

## 2020-11-14 ENCOUNTER — Ambulatory Visit (HOSPITAL_COMMUNITY): Payer: PPO | Admitting: Physical Therapy

## 2020-11-14 ENCOUNTER — Encounter (HOSPITAL_COMMUNITY): Payer: Self-pay | Admitting: Physical Therapy

## 2020-11-14 DIAGNOSIS — M6281 Muscle weakness (generalized): Secondary | ICD-10-CM

## 2020-11-14 DIAGNOSIS — R600 Localized edema: Secondary | ICD-10-CM

## 2020-11-14 DIAGNOSIS — M25562 Pain in left knee: Secondary | ICD-10-CM

## 2020-11-14 DIAGNOSIS — R29898 Other symptoms and signs involving the musculoskeletal system: Secondary | ICD-10-CM

## 2020-11-14 DIAGNOSIS — G8929 Other chronic pain: Secondary | ICD-10-CM

## 2020-11-14 NOTE — Addendum Note (Signed)
Addended by: Jerene Pitch R on: 11/14/2020 08:31 AM   Modules accepted: Orders

## 2020-11-14 NOTE — Therapy (Signed)
[place] [address] [place], [place], [address] Phone: [phone]   Fax:  [phone]  Physical Therapy Treatment and Progress Note  Patient Details  Name: Jacob Hunter MRN: [id number] Date of Birth: Jun 12, 1956 Referring Provider (PT): Dr. Sydnee Cabal, MD   Progress Note Reporting Period 10/30/20 to 11/14/20  See note below for Objective Data and Assessment of Progress/Goals.       Encounter Date: 11/14/2020   PT End of Session - 11/14/20 0754    Visit Number 16    Number of Visits 20    Date for PT Re-Evaluation 11/28/20    Authorization Type [employer], no VL, no auth    Progress Note Due on Visit 26    PT Start Time 0745    PT Stop Time 0825    PT Time Calculation (min) 40 min    Activity Tolerance Patient tolerated treatment well;Patient limited by pain    Behavior During Therapy [facility] for tasks assessed/performed           Past Medical History:  Diagnosis Date  . Arthritis   . Knee pain   . Necrotizing myopathy   . Pneumonia   . Polymyositis [facility])     Past Surgical History:  Procedure Laterality Date  . ANKLE SURGERY Left   . COLONOSCOPY N/A 10/05/2019   Rourk: 3 colonic polyps removed, 2 were tubular adenomas, cecal polyp was sessile serrated polyp.  Advised 3-year surveillance colonoscopy.  [name] EYE SURGERY     bil cataracts  . KNEE SURGERY Left    arthroscopy x 2  . left wrist surgery    . POLYPECTOMY  10/05/2019   Procedure: POLYPECTOMY;  Surgeon: Daneil Dolin, MD;  [facility];  Service: Endoscopy;;  . TOTAL KNEE ARTHROPLASTY Left 10/04/2020   Procedure: TOTAL KNEE ARTHROPLASTY;  Surgeon: Sydnee Cabal, MD;  [facility];  Service: Orthopedics;  Laterality: Left;  adductor canal    There were no vitals filed for this visit.   Subjective Assessment - 11/14/20 0753    Subjective States that he has about 3/10 pain in his knee and this is his normal. States no  difficulties since start of PT. States overall he feels 85% better since the start of PT reports that the last 15% is really the swelling. States the swelling is all the time. States that he is elevating every day and icing but not wearing compression garments. States he is icing everyday.    Pain Score 3     Pain Location Knee    Pain Orientation Left    Pain Descriptors / Indicators Aching    Pain Type Surgical pain              [facility] PT Assessment - 11/14/20 0001      Assessment   Medical Diagnosis Left TKR    Referring Provider (PT) Dr. Sydnee Cabal, MD    Onset Date/Surgical Date 10/04/20    Next MD Visit 01/05/21      Observation/Other Assessments   Focus on Therapeutic Outcomes (FOTO)  56% function, was 49% function      Observation/Other Assessments-Edema    Edema Circumferential   at joint line L 44.7cm, R 42.0cm     AROM   Left Knee Extension 4   lacking   Left Knee Flexion 122      Strength   Right Knee Flexion 5/5    Right Knee Extension 5/5  Left Knee Flexion 4+/5    Left Knee Extension 5/5                         [facility] Adult PT Treatment/Exercise - 11/14/20 0001      Knee/Hip Exercises: Standing   Other Standing Knee Exercises lunges - cues to be elevator and go straight up towards sky 2x10 left forward with UE assist for balance                  PT Education - 11/14/20 0827    Education Details on compression garments, on benefits and sizing of garments and when to wear them. on FOTO score and progress made.    Person(s) Educated Patient    Methods Explanation    Comprehension Verbalized understanding            PT Short Term Goals - 10/30/20 0820      PT SHORT TERM GOAL #1   Title Patient will report at least 25% improvement in symptoms for improved quality of life.    Baseline 50% improvement    Time 4    Period Weeks    Status Achieved    Target Date 11/03/20      PT SHORT TERM GOAL #2   Title Patient will  demonstrate left knee flexion to 90 degrees and 5 degrees lacking extension to improve gait mechanics    Baseline lacking 5 degrees extension, 105 flexion    Time 4    Period Weeks    Status Achieved    Target Date 11/03/20      PT SHORT TERM GOAL #3   Title Demo improved ambulation as evident by distance of 150 ft during 2MWT    Baseline 70 ft with RW; 300 ft with RW    Time 4    Period Weeks    Status Achieved    Target Date 11/03/20             PT Long Term Goals - 11/14/20 0802      PT LONG TERM GOAL #1   Title Patient will be independent with HEP in order to improve functional outcomes.    Time 6    Period Weeks    Status Achieved      PT LONG TERM GOAL #2   Title Patient will improve on FOTO score to meet predicted outcomes to improve functional independence    Baseline 26.4% function; 40.9% function    Time 6    Period Weeks    Status On-going      PT LONG TERM GOAL #3   Title Demo 110 degrees left knee flexion and full extension in order to facilitate normalized gait pattern and transfers    Baseline lacking 5 degrees extension, 105 flexion    Time 6    Period Weeks    Status On-going                 Plan - 11/14/20 0826    Clinical Impression Statement Progress note performed on this date overall patient is doing well and has met all short term goals and has met1/3 long term goals at this time. Focused on educating patient and continued limitations. Swelling still noted in left knee, measured patient for compression garments. Extending POC additional 2x/week for 2 weeks to continue to work on current POC.    Personal Factors and Comorbidities Comorbidity 1    Comorbidities PMH  Examination-Activity Limitations Bed Mobility;Bend;Lift;Toileting;Stand;Stairs;Squat;Locomotion Level;Transfers    Examination-Participation Restrictions Cleaning;Community Activity;Interpersonal Relationship;Yard Work;Shop;Driving    Stability/Clinical Decision Making  Stable/Uncomplicated    Rehab Potential Excellent    PT Frequency 2x / week    PT Duration 2 weeks    PT Treatment/Interventions ADLs/Self Care Home Management;Aquatic Therapy;Cryotherapy;Electrical Stimulation;DME Instruction;Moist Heat;Gait training;Stair training;Functional mobility training;Therapeutic activities;Therapeutic exercise;Balance training;Patient/family education;Neuromuscular re-education;Wheelchair mobility training;Manual techniques;Passive range of motion;Taping;Energy conservation;Spinal Manipulations;Joint Manipulations    PT Next Visit Plan f/u with compression garments, focus on lunges and end knee range strength and end range TKE    PT Home Exercise Plan QS, heel slides, knee flexion drive on 6" stair; stand heel/toe raise; 10/16/20 - LAQ, seated heel slide. Seated OKC PRE with red t-loop; 3/17-22 - self scar massage 3/24 fwd and lateral step up    Consulted and Agree with Plan of Care Patient           Patient will benefit from skilled therapeutic intervention in order to improve the following deficits and impairments:  Abnormal gait,Decreased activity tolerance,Decreased balance,Decreased mobility,Decreased endurance,Decreased range of motion,Decreased strength,Increased edema,Difficulty walking,Impaired perceived functional ability,Improper body mechanics,Pain  Visit Diagnosis: Chronic pain of left knee  Muscle weakness (generalized)  Localized edema  Other symptoms and signs involving the musculoskeletal system     Problem List Patient Active Problem List   Diagnosis Date Noted  . Osteoarthritis of left knee 10/04/2020  . History of IBS 01/21/2020  . Chronic diarrhea 10/25/2019  . Back pain 07/26/2019  . Spinal stenosis 07/26/2019  . Polymyositis ([place]) 07/26/2019  . Pain in left knee 07/21/2018  . Anxiety and depression 12/30/2015  . Impaired mobility and activities of daily living 12/30/2015  . 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 Bilateral leg  weakness 01/24/2015  . Elevated CPK 01/24/2015  . Transaminitis 10/24/2014    8:29 AM, 11/14/20 Jerene Pitch, DPT Physical Therapy with [facility]  [phone] office  [place] [address] [place], [place], [address] Phone: [phone]   Fax:  [phone]  Name: Jacob Hunter MRN: [id number] Date of Birth: 11/02/1955

## 2020-11-16 ENCOUNTER — Ambulatory Visit (HOSPITAL_COMMUNITY): Payer: PPO | Admitting: Physical Therapy

## 2020-11-16 ENCOUNTER — Other Ambulatory Visit: Payer: Self-pay

## 2020-11-16 DIAGNOSIS — M25562 Pain in left knee: Secondary | ICD-10-CM

## 2020-11-16 DIAGNOSIS — R600 Localized edema: Secondary | ICD-10-CM

## 2020-11-16 DIAGNOSIS — M6281 Muscle weakness (generalized): Secondary | ICD-10-CM

## 2020-11-16 DIAGNOSIS — G8929 Other chronic pain: Secondary | ICD-10-CM

## 2020-11-16 NOTE — Therapy (Signed)
[place], [place], [address] Phone: [phone]   Fax:  [phone]  Physical Therapy Treatment  Patient Details  Name: Jacob Hunter MRN: [id number] Date of Birth: 1956-01-14 Referring Provider (PT): Dr. Sydnee Cabal, MD   Encounter Date: 11/16/2020   PT End of Session - 11/16/20 0758    Visit Number 17    Number of Visits 20    Date for PT Re-Evaluation 11/28/20    Authorization Type [employer], no VL, no auth    Progress Note Due on Visit 26    PT Start Time 0745    PT Stop Time 0825    PT Time Calculation (min) 40 min    Activity Tolerance Patient tolerated treatment well;Patient limited by pain    Behavior During Therapy [facility] for tasks assessed/performed           Past Medical History:  Diagnosis Date  . Arthritis   . Knee pain   . Necrotizing myopathy   . Pneumonia   . Polymyositis [facility])     Past Surgical History:  Procedure Laterality Date  . ANKLE SURGERY Left   . COLONOSCOPY N/A 10/05/2019   Rourk: 3 colonic polyps removed, 2 were tubular adenomas, cecal polyp was sessile serrated polyp.  Advised 3-year surveillance colonoscopy.  [name] EYE SURGERY     bil cataracts  . KNEE SURGERY Left    arthroscopy x 2  . left wrist surgery    . POLYPECTOMY  10/05/2019   Procedure: POLYPECTOMY;  Surgeon: Daneil Dolin, MD;  [facility];  Service: Endoscopy;;  . TOTAL KNEE ARTHROPLASTY Left 10/04/2020   Procedure: TOTAL KNEE ARTHROPLASTY;  Surgeon: Sydnee Cabal, MD;  Location: [facility] ORS;  Service: Orthopedics;  Laterality: Left;  adductor canal    There were no vitals filed for this visit.   Subjective Assessment - 11/16/20 0753    Subjective States that last night before he went to bed did a massage and that helped. States that the swelling went down a little bit. States that he had compression garments from previous knee surgery but they were painful by the end of the day.     Currently in Pain? No/denies              [facility] PT Assessment - 11/16/20 0001      Assessment   Medical Diagnosis Left TKR    Referring Provider (PT) Dr. Sydnee Cabal, MD                         [facility] Adult PT Treatment/Exercise - 11/16/20 0001      Knee/Hip Exercises: Stretches   Active Hamstring Stretch 5 reps;20 seconds;Left    Other Knee/Hip Stretches knee flexion stretch on 12" step x5 20" holds Left      Knee/Hip Exercises: Standing   Forward Lunges Both;3 sets   UE assist, 6 reps   Other Standing Knee Exercises knee flexion - intermittently throughout session    Other Standing Knee Exercises star slides with standing on left leg 3x5 each; hamstring curls with hold 2x10 5" holds L - at bar to prevent forward trunk movement      Knee/Hip Exercises: Seated   Sit to Sand 5 reps   with yellow weight ball, cues to shift weigt to left side 4 sets  PT Short Term Goals - 10/30/20 0820      PT SHORT TERM GOAL #1   Title Patient will report at least 25% improvement in symptoms for improved quality of life.    Baseline 50% improvement    Time 4    Period Weeks    Status Achieved    Target Date 11/03/20      PT SHORT TERM GOAL #2   Title Patient will demonstrate left knee flexion to 90 degrees and 5 degrees lacking extension to improve gait mechanics    Baseline lacking 5 degrees extension, 105 flexion    Time 4    Period Weeks    Status Achieved    Target Date 11/03/20      PT SHORT TERM GOAL #3   Title Demo improved ambulation as evident by distance of 150 ft during 2MWT    Baseline 70 ft with RW; 300 ft with RW    Time 4    Period Weeks    Status Achieved    Target Date 11/03/20             PT Long Term Goals - 11/14/20 0802      PT LONG TERM GOAL #1   Title Patient will be independent with HEP in order to improve functional outcomes.    Time 6    Period Weeks    Status Achieved      PT LONG TERM GOAL #2    Title Patient will improve on FOTO score to meet predicted outcomes to improve functional independence    Baseline 26.4% function; 40.9% function    Time 6    Period Weeks    Status On-going      PT LONG TERM GOAL #3   Title Demo 110 degrees left knee flexion and full extension in order to facilitate normalized gait pattern and transfers    Baseline lacking 5 degrees extension, 105 flexion    Time 6    Period Weeks    Status On-going                 Plan - 11/16/20 4010    Clinical Impression Statement Initially lunges with too much forward movement, this improved with tactile and verbal cues. With fatigue patient had tendency to collapse trunk, this also improved with rest and with verbal cues. Longer rest break taken after lunges secondary to increased respiratory rate and fatigue. Continued with terminal knee extension strength and lunge form. Patient performing well and would continue to benefit from skilled PT to improve overall form and strength.    Personal Factors and Comorbidities Comorbidity 1    Comorbidities PMH    Examination-Activity Limitations Bed Mobility;Bend;Lift;Toileting;Stand;Stairs;Squat;Locomotion Level;Transfers    Examination-Participation Restrictions Cleaning;Community Activity;Interpersonal Relationship;Yard Work;Shop;Driving    Stability/Clinical Decision Making Stable/Uncomplicated    Rehab Potential Excellent    PT Frequency 2x / week    PT Duration 2 weeks    PT Treatment/Interventions ADLs/Self Care Home Management;Aquatic Therapy;Cryotherapy;Electrical Stimulation;DME Instruction;Moist Heat;Gait training;Stair training;Functional mobility training;Therapeutic activities;Therapeutic exercise;Balance training;Patient/family education;Neuromuscular re-education;Wheelchair mobility training;Manual techniques;Passive range of motion;Taping;Energy conservation;Spinal Manipulations;Joint Manipulations    PT Next Visit Plan f/u with compression garments,  focus on lunges and end knee range strength and end range TKE    PT Home Exercise Plan QS, heel slides, knee flexion drive on 6" stair; stand heel/toe raise; 10/16/20 - LAQ, seated heel slide. Seated OKC PRE with red t-loop; 3/17-22 - self scar massage 3/24 fwd and lateral step up  Consulted and Agree with Plan of Care Patient           Patient will benefit from skilled therapeutic intervention in order to improve the following deficits and impairments:  Abnormal gait,Decreased activity tolerance,Decreased balance,Decreased mobility,Decreased endurance,Decreased range of motion,Decreased strength,Increased edema,Difficulty walking,Impaired perceived functional ability,Improper body mechanics,Pain  Visit Diagnosis: Chronic pain of left knee  Muscle weakness (generalized)  Localized edema     Problem List Patient Active Problem List   Diagnosis Date Noted  . Osteoarthritis of left knee 10/04/2020  . History of IBS 01/21/2020  . Chronic diarrhea 10/25/2019  . Back pain 07/26/2019  . Spinal stenosis 07/26/2019  . Polymyositis ([place]) 07/26/2019  . Pain in left knee 07/21/2018  . Anxiety and depression 12/30/2015  . Impaired mobility and activities of daily living 12/30/2015  . 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 Bilateral leg weakness 01/24/2015  . Elevated CPK 01/24/2015  . Transaminitis 10/24/2014   8:25 AM, 11/16/20 Jerene Pitch, DPT Physical Therapy with [facility]  [phone] office  [place] [address] [place], [place], [address] Phone: [phone]   Fax:  [phone]  Name: Jacob Hunter MRN: [id number] Date of Birth: 1956-07-01

## 2020-11-20 ENCOUNTER — Ambulatory Visit (HOSPITAL_COMMUNITY): Payer: PPO

## 2020-11-20 ENCOUNTER — Encounter (HOSPITAL_COMMUNITY): Payer: Self-pay

## 2020-11-20 ENCOUNTER — Other Ambulatory Visit: Payer: Self-pay

## 2020-11-20 DIAGNOSIS — M6281 Muscle weakness (generalized): Secondary | ICD-10-CM

## 2020-11-20 DIAGNOSIS — M25562 Pain in left knee: Secondary | ICD-10-CM

## 2020-11-20 DIAGNOSIS — R600 Localized edema: Secondary | ICD-10-CM

## 2020-11-20 DIAGNOSIS — R29898 Other symptoms and signs involving the musculoskeletal system: Secondary | ICD-10-CM

## 2020-11-20 DIAGNOSIS — G8929 Other chronic pain: Secondary | ICD-10-CM

## 2020-11-20 NOTE — Therapy (Signed)
[place], [place], [address] Phone: [phone]   Fax:  [phone]  Physical Therapy Treatment  Patient Details  Name: Jacob Hunter MRN: [id number] Date of Birth: 01-15-1956 Referring Provider (PT): Dr. Sydnee Cabal, MD   Encounter Date: 11/20/2020   PT End of Session - 11/20/20 0912    Visit Number 17    Number of Visits 20    Date for PT Re-Evaluation 11/28/20    Authorization Type [employer], no VL, no auth    Progress Note Due on Visit 26    PT Start Time 0848    Activity Tolerance Patient tolerated treatment well;Patient limited by pain    Behavior During Therapy [facility] for tasks assessed/performed           Past Medical History:  Diagnosis Date  . Arthritis   . Knee pain   . Necrotizing myopathy   . Pneumonia   . Polymyositis [facility])     Past Surgical History:  Procedure Laterality Date  . ANKLE SURGERY Left   . COLONOSCOPY N/A 10/05/2019   Rourk: 3 colonic polyps removed, 2 were tubular adenomas, cecal polyp was sessile serrated polyp.  Advised 3-year surveillance colonoscopy.  [name] EYE SURGERY     bil cataracts  . KNEE SURGERY Left    arthroscopy x 2  . left wrist surgery    . POLYPECTOMY  10/05/2019   Procedure: POLYPECTOMY;  Surgeon: Daneil Dolin, MD;  [facility];  Service: Endoscopy;;  . TOTAL KNEE ARTHROPLASTY Left 10/04/2020   Procedure: TOTAL KNEE ARTHROPLASTY;  Surgeon: Sydnee Cabal, MD;  [facility];  Service: Orthopedics;  Laterality: Left;  adductor canal    There were no vitals filed for this visit.   Subjective Assessment - 11/20/20 0850    Subjective Was up until 2 am because dad has temp of 103. Tired today. 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 Hamstrings and quads were sore after lunges last session. Only needed Tylenol for soreness.             [facility] Adult PT Treatment/Exercise - 11/20/20 0001      Knee/Hip  Exercises: Stretches   Active Hamstring Stretch 5 reps;20 seconds;Left    Other Knee/Hip Stretches knee flexion stretch on 12" step x5 20" holds Left      Knee/Hip Exercises: Standing   Forward Lunges Both;2 sets   UE assist,5 reps   Other Standing Knee Exercises star slides with standing on left leg 3x5 each; hamstring curls with hold 2x10 5" holds L - at bar to prevent forward trunk movement      Knee/Hip Exercises: Seated   Sit to Sand 10 reps   with pink ball adduction, cues to shift weigt to left side 2 sets     Knee/Hip Exercises: Supine   Knee Extension AROM;Left    Knee Extension Limitations lacking 3-4    Knee Flexion AROM    Knee Flexion Limitations 124           PT Education - 11/20/20 0911    Education Details Discussed purpose and technique of interventions throughout session.    Person(s) Educated Patient    Methods Explanation    Comprehension Verbalized understanding            PT Short Term Goals - 10/30/20 0820      PT SHORT TERM GOAL #1   Title Patient will  report at least 25% improvement in symptoms for improved quality of life.    Baseline 50% improvement    Time 4    Period Weeks    Status Achieved    Target Date 11/03/20      PT SHORT TERM GOAL #2   Title Patient will demonstrate left knee flexion to 90 degrees and 5 degrees lacking extension to improve gait mechanics    Baseline lacking 5 degrees extension, 105 flexion    Time 4    Period Weeks    Status Achieved    Target Date 11/03/20      PT SHORT TERM GOAL #3   Title Demo improved ambulation as evident by distance of 150 ft during 2MWT    Baseline 70 ft with RW; 300 ft with RW    Time 4    Period Weeks    Status Achieved    Target Date 11/03/20             PT Long Term Goals - 11/20/20 0921      PT LONG TERM GOAL #1   Title Patient will be independent with HEP in order to improve functional outcomes.    Time 6    Period Weeks    Status Achieved      PT LONG TERM GOAL #2    Title Patient will improve on FOTO score to meet predicted outcomes to improve functional independence    Baseline 26.4% function; 40.9% function    Time 6    Period Weeks    Status On-going      PT LONG TERM GOAL #3   Title Demo 110 degrees left knee flexion and full extension in order to facilitate normalized gait pattern and transfers    Baseline lacking 5 degrees extension, 105 flexion    Time 6    Period Weeks    Status On-going              Plan - 11/20/20 0920    Clinical Impression Statement Session focused on hamstring and quad strengthening with proper form. Much improved form for sit to stand, forward lunges and hamstring curls with use of mirror for visual feedback. Fairly frequent rest breaks during session but patient overall performed well. Patient noted quad/VMo soreness and fatigue during session. PT explained performing exercises as last session to reduce lactic acid and soreness post session. Patient performing well and would continue to benefit from skilled PT to improve overall form and strength.    Personal Factors and Comorbidities Comorbidity 1    Comorbidities PMH    Examination-Activity Limitations Bed Mobility;Bend;Lift;Toileting;Stand;Stairs;Squat;Locomotion Level;Transfers    Examination-Participation Restrictions Cleaning;Community Activity;Interpersonal Relationship;Yard Work;Shop;Driving    Stability/Clinical Decision Making Stable/Uncomplicated    Rehab Potential Excellent    PT Frequency 2x / week    PT Duration 2 weeks    PT Treatment/Interventions ADLs/Self Care Home Management;Aquatic Therapy;Cryotherapy;Electrical Stimulation;DME Instruction;Moist Heat;Gait training;Stair training;Functional mobility training;Therapeutic activities;Therapeutic exercise;Balance training;Patient/family education;Neuromuscular re-education;Wheelchair mobility training;Manual techniques;Passive range of motion;Taping;Energy conservation;Spinal Manipulations;Joint  Manipulations    PT Next Visit Plan f/u with compression garments, focus on lunges and end knee range strength and end range TKE    PT Home Exercise Plan QS, heel slides, knee flexion drive on 6" stair; stand heel/toe raise; 10/16/20 - LAQ, seated heel slide. Seated OKC PRE with red t-loop; 3/17-22 - self scar massage 3/24 fwd and lateral step up    Consulted and Agree with Plan of Care Patient  Patient will benefit from skilled therapeutic intervention in order to improve the following deficits and impairments:  Abnormal gait,Decreased activity tolerance,Decreased balance,Decreased mobility,Decreased endurance,Decreased range of motion,Decreased strength,Increased edema,Difficulty walking,Impaired perceived functional ability,Improper body mechanics,Pain  Visit Diagnosis: Chronic pain of left knee  Muscle weakness (generalized)  Localized edema  Other symptoms and signs involving the musculoskeletal system     Problem List Patient Active Problem List   Diagnosis Date Noted  . Osteoarthritis of left knee 10/04/2020  . History of IBS 01/21/2020  . Chronic diarrhea 10/25/2019  . Back pain 07/26/2019  . Spinal stenosis 07/26/2019  . Polymyositis ([place]) 07/26/2019  . Pain in left knee 07/21/2018  . Anxiety and depression 12/30/2015  . Impaired mobility and activities of daily living 12/30/2015  . 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 Bilateral leg weakness 01/24/2015  . Elevated CPK 01/24/2015  . Transaminitis 10/24/2014   Floria Raveling. Hartnett-Rands, MS, PT Per Ferguson [phone] Jeannie Done 11/20/2020, 9:30 AM  [place] [address] [place], [place], [address] Phone: [phone]   Fax:  [phone]  Name: Jacob Hunter MRN: [id number] Date of Birth: 1956/06/03

## 2020-11-22 ENCOUNTER — Encounter (HOSPITAL_COMMUNITY): Payer: PPO | Admitting: Physical Therapy

## 2020-11-23 ENCOUNTER — Encounter (HOSPITAL_COMMUNITY): Payer: PPO | Admitting: Physical Therapy

## 2020-11-27 ENCOUNTER — Encounter (HOSPITAL_COMMUNITY): Payer: Self-pay | Admitting: Physical Therapy

## 2020-11-27 ENCOUNTER — Other Ambulatory Visit: Payer: Self-pay

## 2020-11-27 ENCOUNTER — Ambulatory Visit (HOSPITAL_COMMUNITY): Payer: PPO | Admitting: Physical Therapy

## 2020-11-27 DIAGNOSIS — R29898 Other symptoms and signs involving the musculoskeletal system: Secondary | ICD-10-CM

## 2020-11-27 DIAGNOSIS — M6281 Muscle weakness (generalized): Secondary | ICD-10-CM

## 2020-11-27 DIAGNOSIS — G8929 Other chronic pain: Secondary | ICD-10-CM

## 2020-11-27 DIAGNOSIS — M25562 Pain in left knee: Secondary | ICD-10-CM | POA: Diagnosis not present

## 2020-11-27 DIAGNOSIS — R600 Localized edema: Secondary | ICD-10-CM

## 2020-11-27 NOTE — Therapy (Signed)
[facility] [address] [place], [place], [address] Phone: [phone]   Fax:  [phone]  Physical Therapy Treatment and Discharge Note  Patient Details  Name: Jacob Hunter MRN: [id number] Date of Birth: [birth year] Referring Provider (PT): Dr. Sydnee Cabal, MD  PHYSICAL THERAPY DISCHARGE SUMMARY  Visits from Start of Care: 19  Current functional level related to goals / functional outcomes: See below   Remaining deficits: See below   Education / Equipment: See below  Plan: Patient agrees to discharge.  Patient goals were partially met. Patient is being discharged due to being pleased with the current functional level.  ?????       Encounter Date: 11/27/2020   PT End of Session - 11/27/20 0746    Visit Number 19    Number of Visits 20    Date for PT Re-Evaluation 11/28/20    Authorization Type [employer], no VL, no auth    Progress Note Due on Visit 26    PT Start Time 0745    PT Stop Time 0803   requested to be done early   PT Time Calculation (min) 18 min    Activity Tolerance Patient tolerated treatment well;Patient limited by pain    Behavior During Therapy [facility] for tasks assessed/performed           Past Medical History:  Diagnosis Date  . Arthritis   . Knee pain   . Necrotizing myopathy   . Pneumonia   . Polymyositis [facility])     Past Surgical History:  Procedure Laterality Date  . ANKLE SURGERY Left   . COLONOSCOPY N/A 10/05/2019   Rourk: 3 colonic polyps removed, 2 were tubular adenomas, cecal polyp was sessile serrated polyp.  Advised 3-year surveillance colonoscopy.  [name] EYE SURGERY     bil cataracts  . KNEE SURGERY Left    arthroscopy x 2  . left wrist surgery    . POLYPECTOMY  10/05/2019   Procedure: POLYPECTOMY;  Surgeon: Daneil Dolin, MD;  [facility];  Service: Endoscopy;;  . TOTAL KNEE ARTHROPLASTY Left 10/04/2020   Procedure: TOTAL KNEE ARTHROPLASTY;  Surgeon:  Sydnee Cabal, MD;  [facility];  Service: Orthopedics;  Laterality: Left;  adductor canal    There were no vitals filed for this visit.   Subjective Assessment - 11/27/20 0751    Subjective States he feels good. States he does his exercises every other day and feels good about them. States squats and lunges are getting better. States overall he feels about 90% better, states that the last 10% is swelling in his knee with prolonged activity. States he was not wearing his compression garments this weekend when he was at the farmers [place].    Currently in Pain? No/denies              [facility] PT Assessment - 11/27/20 0001      Assessment   Medical Diagnosis Left TKR    Referring Provider (PT) Dr. Sydnee Cabal, MD    Next MD Visit 01/05/21      Observation/Other Assessments   Focus on Therapeutic Outcomes (FOTO)  current 65% function, predicted 68%      AROM   Right Knee Extension 2   lacking   Right Knee Flexion 130   lacking   Left Knee Extension 3    Left Knee Flexion 127      Strength   Right Knee Flexion 5/5    Right Knee  Extension 5/5    Left Knee Flexion 4+/5    Left Knee Extension 5/5      Transfers   Sit to Stand 7: Independent                                 PT Education - 11/27/20 0806    Education Details on continued use of compression gaments, on HEP and focus moving forward    Person(s) Educated Patient    Methods Explanation    Comprehension Verbalized understanding            PT Short Term Goals - 10/30/20 0820      PT SHORT TERM GOAL #1   Title Patient will report at least 25% improvement in symptoms for improved quality of life.    Baseline 50% improvement    Time 4    Period Weeks    Status Achieved    Target Date 11/03/20      PT SHORT TERM GOAL #2   Title Patient will demonstrate left knee flexion to 90 degrees and 5 degrees lacking extension to improve gait mechanics    Baseline lacking 5 degrees  extension, 105 flexion    Time 4    Period Weeks    Status Achieved    Target Date 11/03/20      PT SHORT TERM GOAL #3   Title Demo improved ambulation as evident by distance of 150 ft during 2MWT    Baseline 70 ft with RW; 300 ft with RW    Time 4    Period Weeks    Status Achieved    Target Date 11/03/20             PT Long Term Goals - 11/27/20 0756      PT LONG TERM GOAL #1   Title Patient will be independent with HEP in order to improve functional outcomes.    Time 6    Period Weeks    Status Achieved      PT LONG TERM GOAL #2   Title Patient will improve on FOTO score to meet predicted outcomes to improve functional independence    Baseline current 65% an dpredicted is 68%    Time 6    Period Weeks    Status On-going      PT LONG TERM GOAL #3   Title Demo 110 degrees left knee flexion and full extension in order to facilitate normalized gait pattern and transfers    Baseline 3-127    Time 6    Period Weeks    Status On-going                 Plan - 11/27/20 1448    Clinical Impression Statement Patient with all short term goals met and 1/3 long term goals, but with patient almost meeting FOTO and ROM goal. Reviewed HEP and answered all questions, patient to discharge from PT to HEP at this time.    Personal Factors and Comorbidities Comorbidity 1    Comorbidities PMH    Examination-Activity Limitations Bed Mobility;Bend;Lift;Toileting;Stand;Stairs;Squat;Locomotion Level;Transfers    Examination-Participation Restrictions Cleaning;Community Activity;Interpersonal Relationship;Yard Work;Shop;Driving    Stability/Clinical Decision Making Stable/Uncomplicated    Rehab Potential Excellent    PT Frequency 2x / week    PT Duration 2 weeks    PT Treatment/Interventions ADLs/Self Care Home Management;Aquatic Therapy;Cryotherapy;Electrical Stimulation;DME Instruction;Moist Heat;Gait training;Stair training;Functional mobility training;Therapeutic  activities;Therapeutic exercise;Balance training;Patient/family education;Neuromuscular re-education;Wheelchair  mobility training;Manual techniques;Passive range of motion;Taping;Energy conservation;Spinal Manipulations;Joint Manipulations    PT Next Visit Plan DC to HEP    PT Home Exercise Plan QS, heel slides, knee flexion drive on 6" stair; stand heel/toe raise; 10/16/20 - LAQ, seated heel slide. Seated OKC PRE with red t-loop; 3/17-22 - self scar massage 3/24 fwd and lateral step up    Consulted and Agree with Plan of Care Patient           Patient will benefit from skilled therapeutic intervention in order to improve the following deficits and impairments:  Abnormal gait,Decreased activity tolerance,Decreased balance,Decreased mobility,Decreased endurance,Decreased range of motion,Decreased strength,Increased edema,Difficulty walking,Impaired perceived functional ability,Improper body mechanics,Pain  Visit Diagnosis: Chronic pain of left knee  Muscle weakness (generalized)  Localized edema  Other symptoms and signs involving the musculoskeletal system     Problem List Patient Active Problem List   Diagnosis Date Noted  . Osteoarthritis of left knee 10/04/2020  . History of IBS 01/21/2020  . Chronic diarrhea 10/25/2019  . Back pain 07/26/2019  . Spinal stenosis 07/26/2019  . Polymyositis ([place]) 07/26/2019  . Pain in left knee 07/21/2018  . Anxiety and depression 12/30/2015  . Impaired mobility and activities of daily living 12/30/2015  . 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 Bilateral leg weakness 01/24/2015  . Elevated CPK 01/24/2015  . Transaminitis 10/24/2014   8:12 AM, 11/27/20 Jerene Pitch, DPT Physical Therapy with [facility]  [phone] office  [place] [address] [place], [place], [address] Phone: [phone]   Fax:  [phone]  Name: Jacob LAUDER MRN: [id number] Date of Birth:  07-May-1956

## 2020-12-01 ENCOUNTER — Emergency Department (HOSPITAL_COMMUNITY)
Admission: EM | Admit: 2020-12-01 | Discharge: 2020-12-01 | Disposition: A | Discharge status: Home / Self Care | Payer: PPO | Attending: Emergency Medicine | Admitting: Emergency Medicine

## 2020-12-01 ENCOUNTER — Other Ambulatory Visit: Payer: Self-pay

## 2020-12-01 ENCOUNTER — Emergency Department (HOSPITAL_COMMUNITY): Payer: PPO

## 2020-12-01 ENCOUNTER — Encounter (HOSPITAL_COMMUNITY): Payer: Self-pay | Admitting: Emergency Medicine

## 2020-12-01 DIAGNOSIS — R55 Syncope and collapse: Secondary | ICD-10-CM | POA: Diagnosis not present

## 2020-12-01 DIAGNOSIS — K7689 Other specified diseases of liver: Secondary | ICD-10-CM | POA: Diagnosis not present

## 2020-12-01 DIAGNOSIS — N131 Hydronephrosis with ureteral stricture, not elsewhere classified: Secondary | ICD-10-CM | POA: Insufficient documentation

## 2020-12-01 DIAGNOSIS — N200 Calculus of kidney: Secondary | ICD-10-CM

## 2020-12-01 DIAGNOSIS — R1084 Generalized abdominal pain: Secondary | ICD-10-CM | POA: Diagnosis not present

## 2020-12-01 DIAGNOSIS — R0689 Other abnormalities of breathing: Secondary | ICD-10-CM | POA: Diagnosis not present

## 2020-12-01 DIAGNOSIS — N201 Calculus of ureter: Secondary | ICD-10-CM | POA: Diagnosis not present

## 2020-12-01 DIAGNOSIS — R300 Dysuria: Secondary | ICD-10-CM | POA: Diagnosis not present

## 2020-12-01 DIAGNOSIS — R109 Unspecified abdominal pain: Secondary | ICD-10-CM | POA: Diagnosis present

## 2020-12-01 DIAGNOSIS — Z7982 Long term (current) use of aspirin: Secondary | ICD-10-CM | POA: Diagnosis not present

## 2020-12-01 DIAGNOSIS — N3289 Other specified disorders of bladder: Secondary | ICD-10-CM | POA: Diagnosis not present

## 2020-12-01 DIAGNOSIS — N1330 Unspecified hydronephrosis: Secondary | ICD-10-CM | POA: Diagnosis not present

## 2020-12-01 DIAGNOSIS — R000 Tachycardia, unspecified: Secondary | ICD-10-CM | POA: Diagnosis not present

## 2020-12-01 LAB — CBC WITH DIFFERENTIAL/PLATELET
Abs Immature Granulocytes: 0.04 10*3/uL (ref 0.00–0.07)
Basophils Absolute: 0 10*3/uL (ref 0.0–0.1)
Basophils Relative: 0 %
Eosinophils Absolute: 0.1 10*3/uL (ref 0.0–0.5)
Eosinophils Relative: 1 %
HCT: 36.9 % — ABNORMAL LOW (ref 39.0–52.0)
Hemoglobin: 11.6 g/dL — ABNORMAL LOW (ref 13.0–17.0)
Immature Granulocytes: 0 %
Lymphocytes Relative: 12 %
Lymphs Abs: 1.3 10*3/uL (ref 0.7–4.0)
MCH: 29.4 pg (ref 26.0–34.0)
MCHC: 31.4 g/dL (ref 30.0–36.0)
MCV: 93.7 fL (ref 80.0–100.0)
Monocytes Absolute: 1 10*3/uL (ref 0.1–1.0)
Monocytes Relative: 9 %
Neutro Abs: 8.3 10*3/uL — ABNORMAL HIGH (ref 1.7–7.7)
Neutrophils Relative %: 78 %
Platelets: 226 10*3/uL (ref 150–400)
RBC: 3.94 MIL/uL — ABNORMAL LOW (ref 4.22–5.81)
RDW: 16.5 % — ABNORMAL HIGH (ref 11.5–15.5)
WBC: 10.8 10*3/uL — ABNORMAL HIGH (ref 4.0–10.5)
nRBC: 0 % (ref 0.0–0.2)

## 2020-12-01 LAB — BASIC METABOLIC PANEL
Anion gap: 15 (ref 5–15)
BUN: 22 mg/dL (ref 8–23)
CO2: 22 mmol/L (ref 22–32)
Calcium: 9.2 mg/dL (ref 8.9–10.3)
Chloride: 103 mmol/L (ref 98–111)
Creatinine, Ser: 1.42 mg/dL — ABNORMAL HIGH (ref 0.61–1.24)
GFR, Estimated: 56 mL/min — ABNORMAL LOW (ref >60)
Glucose, Bld: 90 mg/dL (ref 70–99)
Potassium: 3.7 mmol/L (ref 3.5–5.1)
Sodium: 140 mmol/L (ref 135–145)

## 2020-12-01 LAB — URINALYSIS, ROUTINE W REFLEX MICROSCOPIC
Bacteria, UA: NONE SEEN
Bilirubin Urine: NEGATIVE
Glucose, UA: NEGATIVE mg/dL
Ketones, ur: NEGATIVE mg/dL
Leukocytes,Ua: NEGATIVE
Nitrite: NEGATIVE
Protein, ur: NEGATIVE mg/dL
RBC / HPF: >50 RBC/hpf — ABNORMAL HIGH (ref 0–5)
Specific Gravity, Urine: 1.018 (ref 1.005–1.030)
pH: 5 (ref 5.0–8.0)

## 2020-12-01 MED ORDER — OXYCODONE-ACETAMINOPHEN 5-325 MG PO TABS: 1.0000 | ORAL_TABLET | Freq: Four times a day (QID) | ORAL | 0 refills | Status: DC | PRN

## 2020-12-01 MED ORDER — TAMSULOSIN HCL 0.4 MG PO CAPS: 0.4000 mg | ORAL_CAPSULE | Freq: Every day | ORAL | 0 refills | Status: DC

## 2020-12-01 MED ORDER — KETOROLAC TROMETHAMINE 30 MG/ML IJ SOLN
30.0000 mg | Freq: Once | INTRAMUSCULAR | Status: AC
Start: 2020-12-01 — End: 2020-12-01
  Administered 2020-12-01: 30 mg via INTRAVENOUS
  Filled 2020-12-01: qty 1

## 2020-12-01 MED ORDER — MORPHINE SULFATE (PF) 4 MG/ML IV SOLN
4.0000 mg | Freq: Once | INTRAVENOUS | Status: AC
  Administered 2020-12-01: 4 mg via INTRAVENOUS
  Filled 2020-12-01: qty 1

## 2020-12-01 MED ORDER — OXYCODONE-ACETAMINOPHEN 5-325 MG PO TABS: 2.0000 | ORAL_TABLET | ORAL | 0 refills | Status: DC | PRN

## 2020-12-01 MED ORDER — ONDANSETRON HCL 4 MG/2ML IJ SOLN
4.0000 mg | Freq: Once | INTRAMUSCULAR | Status: AC
  Administered 2020-12-01: 4 mg via INTRAVENOUS
  Filled 2020-12-01: qty 2

## 2020-12-01 NOTE — ED Provider Notes (Signed)
[facility] EMERGENCY DEPARTMENT Provider Note   CSN: [id number] Arrival date & time: 12/01/20  0241     History Chief Complaint  Patient presents with  . Flank Pain    Jacob Hunter is a 65 y.o. male.  Patient is a 65 year old male with past medical history of necrotizing myopathy, polymyositis, arthritis.  Patient presents by EMS with complaints of left flank pain.  This woke him up at approximately midnight tonight.  He denies any bowel complaints, but does report noticing bloody urine for the past 2 days.  He denies any dysuria, fevers, or chills.  He has no prior history of kidney stones.  The history is provided by the patient.  Flank Pain This is a new problem. Episode onset: 3 hours ago. The problem occurs constantly. The problem has been rapidly worsening. Pertinent negatives include no chest pain. Nothing aggravates the symptoms. Nothing relieves the symptoms. He has tried nothing for the symptoms.       Past Medical History:  Diagnosis Date  . Arthritis   . Knee pain   . Necrotizing myopathy   . Pneumonia   . Polymyositis [facility])     Patient Active Problem List   Diagnosis Date Noted  . Osteoarthritis of left knee 10/04/2020  . History of IBS 01/21/2020  . Chronic diarrhea 10/25/2019  . Back pain 07/26/2019  . Spinal stenosis 07/26/2019  . Polymyositis ([facility]) 07/26/2019  . Pain in left knee 07/21/2018  . Anxiety and depression 12/30/2015  . Impaired mobility and activities of daily living 12/30/2015  . 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 Bilateral leg weakness 01/24/2015  . Elevated CPK 01/24/2015  . Transaminitis 10/24/2014    Past Surgical History:  Procedure Laterality Date  . ANKLE SURGERY Left   . COLONOSCOPY N/A 10/05/2019   Rourk: 3 colonic polyps removed, 2 were tubular adenomas, cecal polyp was sessile serrated polyp.  Advised 3-year surveillance colonoscopy.  [name] EYE SURGERY     bil cataracts  . KNEE SURGERY Left    arthroscopy x 2  . left  wrist surgery    . POLYPECTOMY  10/05/2019   Procedure: POLYPECTOMY;  Surgeon: Daneil Dolin, MD;  Location: [facility] ENDO SUITE;  Service: Endoscopy;;  . TOTAL KNEE ARTHROPLASTY Left 10/04/2020   Procedure: TOTAL KNEE ARTHROPLASTY;  Surgeon: Sydnee Cabal, MD;  Location: [facility] ORS;  Service: Orthopedics;  Laterality: Left;  adductor canal       Family History  Problem Relation Age of Onset  . Hypertension Father   . Hypercholesterolemia Father   . Hypercholesterolemia Sister   . Hypertension Mother   . Breast cancer Sister   . Colon cancer Neg Hx   . Liver disease Neg Hx     Social History   Tobacco Use  . Smoking status: Never Smoker  . Smokeless tobacco: Never Used  Vaping Use  . Vaping Use: Never used  Substance Use Topics  . Alcohol use: No    Alcohol/week: 0.0 standard drinks    Comment: Last ETOH was 1-2 drinks in 1990.  . Drug use: No    Home Medications Prior to Admission medications   Medication Sig Start Date End Date Taking? Authorizing Provider  acetaminophen (TYLENOL) 500 MG tablet Take 1,000 mg by mouth every 6 (six) hours as needed for moderate pain.    [provider]  ascorbic acid (VITAMIN C) 1000 MG tablet Take 1,000 mg by mouth daily.    [provider]  aspirin 81 MG EC  tablet Take 81 mg by mouth daily. Swallow whole.    [provider]  calcium carbonate (TUMS - DOSED IN MG ELEMENTAL CALCIUM) 500 MG chewable tablet Chew 1 tablet by mouth daily as needed for heartburn.    [provider]  calcium-vitamin D (OSCAL WITH D) 500-200 MG-UNIT tablet Take 1 tablet by mouth daily with breakfast. 07/30/19   Roxan Hockey, MD  cholecalciferol (VITAMIN D3) 25 MCG (1000 UNIT) tablet Take 1,000 Units by mouth daily.    [provider]  Coenzyme Q10 (CO Q 10) 100 MG CAPS Take 100 mg by mouth daily.    [provider]  FLUoxetine (PROZAC) 20 MG capsule Take 20 mg by mouth daily.    [provider]  folic  acid (FOLVITE) 1 MG tablet Take 1 tablet (1 mg total) by mouth daily. 07/29/19   Roxan Hockey, MD  methocarbamol (ROBAXIN) 500 MG tablet Take 1 tablet (500 mg total) by mouth 4 (four) times daily. 10/05/20   Drue Novel, PA  methotrexate (RHEUMATREX) 2.5 MG tablet Take 25 mg by mouth every Monday.     [provider]  ondansetron (ZOFRAN) 4 MG tablet Take 1 tablet (4 mg total) by mouth daily as needed for nausea or vomiting. Patient not taking: No sig reported 10/05/20 10/05/21  Elizabeth Sauer R, PA  oxyCODONE (OXY IR/ROXICODONE) 5 MG immediate release tablet Take 5 mg by mouth every 4 (four) hours as needed for pain.    [provider]  predniSONE (DELTASONE) 10 MG tablet Take 10 mg by mouth daily with breakfast.    [provider]  PRESCRIPTION MEDICATION IVIG Antibodies 2 visits every 4 weeks    [provider]  riTUXimab (RITUXAN) 100 MG/10ML injection Inject 1,000 mg into the vein See admin instructions. 2 times a month in March and September    [provider]  vitamin B-12 (CYANOCOBALAMIN) 1000 MCG tablet Take 1,000 mcg by mouth daily.    [provider]    Allergies    Methylprednisolone, Demerol [meperidine], and Statins  Review of Systems   Review of Systems  Cardiovascular: Negative for chest pain.  Genitourinary: Positive for flank pain.  All other systems reviewed and are negative.   Physical Exam Updated Vital Signs BP (!) 133/102   Pulse 81   Temp 98.1 F (36.7 C)   Resp 18   Ht 6\' 1"  (1.854 m)   Wt 102.1 kg   SpO2 99%   BMI 29.69 kg/m   Physical Exam Vitals and nursing note reviewed.  Constitutional:      General: He is not in acute distress.    Appearance: He is well-developed. He is not diaphoretic.  HENT:     Head: Normocephalic and atraumatic.  Cardiovascular:     Rate and Rhythm: Normal rate and regular rhythm.     Heart sounds: No murmur heard. No friction rub.  Pulmonary:     Effort:  Pulmonary effort is normal. No respiratory distress.     Breath sounds: Normal breath sounds. No wheezing or rales.  Abdominal:     General: Bowel sounds are normal. There is no distension.     Palpations: Abdomen is soft.     Tenderness: There is abdominal tenderness. There is no right CVA tenderness or left CVA tenderness.     Comments: There is tenderness to palpation on the left lower quadrant.  Musculoskeletal:        General: Normal range of motion.  Cervical back: Normal range of motion and neck supple.  Skin:    General: Skin is warm and dry.  Neurological:     Mental Status: He is alert and oriented to person, place, and time.     Coordination: Coordination normal.     ED Results / Procedures / Treatments   Labs (all labs ordered are listed, but only abnormal results are displayed) Labs Reviewed  URINALYSIS, ROUTINE W REFLEX MICROSCOPIC  BASIC METABOLIC PANEL  CBC WITH DIFFERENTIAL/PLATELET    EKG None  Radiology No results found.  Procedures Procedures   Medications Ordered in ED Medications  ondansetron (ZOFRAN) injection 4 mg (has no administration in time range)  morphine 4 MG/ML injection 4 mg (has no administration in time range)  ketorolac (TORADOL) 30 MG/ML injection 30 mg (has no administration in time range)    ED Course  I have reviewed the triage vital signs and the nursing notes.  Pertinent labs & imaging results that were available during my care of the patient were reviewed by me and considered in my medical decision making (see chart for details).    MDM Rules/Calculators/A&P  Patient presenting with left flank pain since earlier this evening and hematuria for the past 2 days.  Presentation most consistent with a renal calculus.  This was confirmed by CT scan which shows a 7 mm stone in the distal ureter with hydronephrosis.  Patient is afebrile with reassuring laboratory studies and urinalysis not consistent with infection.  Patient  feeling better after receiving medications here in the ER.  He will be discharged with Percocet and Flomax, and follow-up with urology as needed.  Final Clinical Impression(s) / ED Diagnoses Final diagnoses:  None    Rx / DC Orders ED Discharge Orders    None       Veryl Speak, MD 12/01/20 [phone]

## 2020-12-01 NOTE — Discharge Instructions (Signed)
Begin taking Percocet as prescribed as needed for pain.  Begin taking Flomax as prescribed.  Follow-up with urology if the stone has not passed by Monday morning.  The contact information for [facility] here in [place] has been provided in this discharge summary for you to call and make these arrangements.  Return to the emergency department if you develop worsening pain, high fevers, or other new and concerning symptoms.

## 2020-12-01 NOTE — ED Triage Notes (Addendum)
[facility] - pt has been c/o left sided flank that started tonight and hematuria x 2-3 days. Pt had syncopal episode during triage

## 2020-12-05 DIAGNOSIS — G7289 Other specified myopathies: Secondary | ICD-10-CM | POA: Diagnosis not present

## 2020-12-05 DIAGNOSIS — G7281 Critical illness myopathy: Secondary | ICD-10-CM | POA: Diagnosis not present

## 2020-12-06 DIAGNOSIS — G7281 Critical illness myopathy: Secondary | ICD-10-CM | POA: Diagnosis not present

## 2020-12-26 DIAGNOSIS — G729 Myopathy, unspecified: Secondary | ICD-10-CM | POA: Diagnosis not present

## 2020-12-26 DIAGNOSIS — G7281 Critical illness myopathy: Secondary | ICD-10-CM | POA: Diagnosis not present

## 2020-12-26 DIAGNOSIS — Z7952 Long term (current) use of systemic steroids: Secondary | ICD-10-CM | POA: Diagnosis not present

## 2020-12-26 DIAGNOSIS — Z79899 Other long term (current) drug therapy: Secondary | ICD-10-CM | POA: Diagnosis not present

## 2020-12-26 DIAGNOSIS — M791 Myalgia, unspecified site: Secondary | ICD-10-CM | POA: Diagnosis not present

## 2020-12-26 DIAGNOSIS — T380X5A Adverse effect of glucocorticoids and synthetic analogues, initial encounter: Secondary | ICD-10-CM | POA: Diagnosis not present

## 2021-01-02 DIAGNOSIS — G7289 Other specified myopathies: Secondary | ICD-10-CM | POA: Diagnosis not present

## 2021-01-02 DIAGNOSIS — D849 Immunodeficiency, unspecified: Secondary | ICD-10-CM | POA: Diagnosis not present

## 2021-01-05 DIAGNOSIS — Z471 Aftercare following joint replacement surgery: Secondary | ICD-10-CM | POA: Diagnosis not present

## 2021-01-05 DIAGNOSIS — Z96652 Presence of left artificial knee joint: Secondary | ICD-10-CM | POA: Diagnosis not present

## 2021-01-29 DIAGNOSIS — Z1283 Encounter for screening for malignant neoplasm of skin: Secondary | ICD-10-CM | POA: Diagnosis not present

## 2021-01-29 DIAGNOSIS — Z85828 Personal history of other malignant neoplasm of skin: Secondary | ICD-10-CM | POA: Diagnosis not present

## 2021-01-29 DIAGNOSIS — D0422 Carcinoma in situ of skin of left ear and external auricular canal: Secondary | ICD-10-CM | POA: Diagnosis not present

## 2021-01-29 DIAGNOSIS — C44319 Basal cell carcinoma of skin of other parts of face: Secondary | ICD-10-CM | POA: Diagnosis not present

## 2021-01-29 DIAGNOSIS — D0439 Carcinoma in situ of skin of other parts of face: Secondary | ICD-10-CM | POA: Diagnosis not present

## 2021-01-29 DIAGNOSIS — B353 Tinea pedis: Secondary | ICD-10-CM | POA: Diagnosis not present

## 2021-01-29 DIAGNOSIS — L570 Actinic keratosis: Secondary | ICD-10-CM | POA: Diagnosis not present

## 2021-01-29 DIAGNOSIS — D485 Neoplasm of uncertain behavior of skin: Secondary | ICD-10-CM | POA: Diagnosis not present

## 2021-01-29 DIAGNOSIS — L814 Other melanin hyperpigmentation: Secondary | ICD-10-CM | POA: Diagnosis not present

## 2021-02-06 DIAGNOSIS — G7289 Other specified myopathies: Secondary | ICD-10-CM | POA: Diagnosis not present

## 2021-03-06 DIAGNOSIS — G7281 Critical illness myopathy: Secondary | ICD-10-CM | POA: Diagnosis not present

## 2021-03-15 DIAGNOSIS — D0422 Carcinoma in situ of skin of left ear and external auricular canal: Secondary | ICD-10-CM | POA: Diagnosis not present

## 2021-03-15 DIAGNOSIS — C44229 Squamous cell carcinoma of skin of left ear and external auricular canal: Secondary | ICD-10-CM | POA: Diagnosis not present

## 2021-03-29 DIAGNOSIS — C44329 Squamous cell carcinoma of skin of other parts of face: Secondary | ICD-10-CM | POA: Diagnosis not present

## 2021-04-03 DIAGNOSIS — G7281 Critical illness myopathy: Secondary | ICD-10-CM | POA: Diagnosis not present

## 2021-04-18 DIAGNOSIS — G7289 Other specified myopathies: Secondary | ICD-10-CM | POA: Diagnosis not present

## 2021-04-25 DIAGNOSIS — G7289 Other specified myopathies: Secondary | ICD-10-CM | POA: Diagnosis not present

## 2021-05-01 DIAGNOSIS — G7281 Critical illness myopathy: Secondary | ICD-10-CM | POA: Diagnosis not present

## 2021-05-01 DIAGNOSIS — Z79899 Other long term (current) drug therapy: Secondary | ICD-10-CM | POA: Diagnosis not present

## 2021-05-29 DIAGNOSIS — G7281 Critical illness myopathy: Secondary | ICD-10-CM | POA: Diagnosis not present

## 2021-06-27 DIAGNOSIS — G7249 Other inflammatory and immune myopathies, not elsewhere classified: Secondary | ICD-10-CM | POA: Diagnosis not present

## 2021-06-27 DIAGNOSIS — Z79899 Other long term (current) drug therapy: Secondary | ICD-10-CM | POA: Diagnosis not present

## 2021-07-03 DIAGNOSIS — G7289 Other specified myopathies: Secondary | ICD-10-CM | POA: Diagnosis not present

## 2021-07-03 DIAGNOSIS — Z79899 Other long term (current) drug therapy: Secondary | ICD-10-CM | POA: Diagnosis not present

## 2021-07-03 DIAGNOSIS — Z7952 Long term (current) use of systemic steroids: Secondary | ICD-10-CM | POA: Diagnosis not present

## 2021-07-24 DIAGNOSIS — G7249 Other inflammatory and immune myopathies, not elsewhere classified: Secondary | ICD-10-CM | POA: Diagnosis not present

## 2021-08-21 DIAGNOSIS — G7249 Other inflammatory and immune myopathies, not elsewhere classified: Secondary | ICD-10-CM | POA: Diagnosis not present

## 2021-08-28 DIAGNOSIS — L570 Actinic keratosis: Secondary | ICD-10-CM | POA: Diagnosis not present

## 2021-09-18 DIAGNOSIS — D509 Iron deficiency anemia, unspecified: Secondary | ICD-10-CM | POA: Diagnosis not present

## 2021-09-18 DIAGNOSIS — F419 Anxiety disorder, unspecified: Secondary | ICD-10-CM | POA: Diagnosis not present

## 2021-09-18 DIAGNOSIS — G7241 Inclusion body myositis [IBM]: Secondary | ICD-10-CM | POA: Diagnosis not present

## 2021-09-18 DIAGNOSIS — E785 Hyperlipidemia, unspecified: Secondary | ICD-10-CM | POA: Diagnosis not present

## 2021-09-18 DIAGNOSIS — Z79899 Other long term (current) drug therapy: Secondary | ICD-10-CM | POA: Diagnosis not present

## 2021-09-18 DIAGNOSIS — M1712 Unilateral primary osteoarthritis, left knee: Secondary | ICD-10-CM | POA: Diagnosis not present

## 2021-09-18 DIAGNOSIS — M109 Gout, unspecified: Secondary | ICD-10-CM | POA: Diagnosis not present

## 2021-09-18 DIAGNOSIS — Z125 Encounter for screening for malignant neoplasm of prostate: Secondary | ICD-10-CM | POA: Diagnosis not present

## 2021-09-18 DIAGNOSIS — K760 Fatty (change of) liver, not elsewhere classified: Secondary | ICD-10-CM | POA: Diagnosis not present

## 2021-09-19 DIAGNOSIS — G7249 Other inflammatory and immune myopathies, not elsewhere classified: Secondary | ICD-10-CM | POA: Diagnosis not present

## 2021-09-19 DIAGNOSIS — G7289 Other specified myopathies: Secondary | ICD-10-CM | POA: Diagnosis not present

## 2021-09-25 DIAGNOSIS — K760 Fatty (change of) liver, not elsewhere classified: Secondary | ICD-10-CM | POA: Diagnosis not present

## 2021-09-25 DIAGNOSIS — E785 Hyperlipidemia, unspecified: Secondary | ICD-10-CM | POA: Diagnosis not present

## 2021-09-25 DIAGNOSIS — Z0000 Encounter for general adult medical examination without abnormal findings: Secondary | ICD-10-CM | POA: Diagnosis not present

## 2021-09-25 DIAGNOSIS — G720 Drug-induced myopathy: Secondary | ICD-10-CM | POA: Diagnosis not present

## 2021-09-25 DIAGNOSIS — Z6829 Body mass index (BMI) 29.0-29.9, adult: Secondary | ICD-10-CM | POA: Diagnosis not present

## 2021-09-25 DIAGNOSIS — F419 Anxiety disorder, unspecified: Secondary | ICD-10-CM | POA: Diagnosis not present

## 2021-09-25 DIAGNOSIS — Z23 Encounter for immunization: Secondary | ICD-10-CM | POA: Diagnosis not present

## 2021-10-16 DIAGNOSIS — G7289 Other specified myopathies: Secondary | ICD-10-CM | POA: Diagnosis not present

## 2021-10-16 DIAGNOSIS — G7249 Other inflammatory and immune myopathies, not elsewhere classified: Secondary | ICD-10-CM | POA: Diagnosis not present

## 2021-10-24 DIAGNOSIS — Z79620 Long term (current) use of immunosuppressive biologic: Secondary | ICD-10-CM | POA: Diagnosis not present

## 2021-10-24 DIAGNOSIS — G7289 Other specified myopathies: Secondary | ICD-10-CM | POA: Diagnosis not present

## 2021-10-31 DIAGNOSIS — G7289 Other specified myopathies: Secondary | ICD-10-CM | POA: Diagnosis not present

## 2021-11-13 DIAGNOSIS — G7289 Other specified myopathies: Secondary | ICD-10-CM | POA: Diagnosis not present

## 2021-11-13 DIAGNOSIS — G7249 Other inflammatory and immune myopathies, not elsewhere classified: Secondary | ICD-10-CM | POA: Diagnosis not present

## 2021-12-11 DIAGNOSIS — D899 Disorder involving the immune mechanism, unspecified: Secondary | ICD-10-CM | POA: Diagnosis not present

## 2021-12-11 DIAGNOSIS — G7289 Other specified myopathies: Secondary | ICD-10-CM | POA: Diagnosis not present

## 2022-01-08 DIAGNOSIS — Z79899 Other long term (current) drug therapy: Secondary | ICD-10-CM | POA: Diagnosis not present

## 2022-01-08 DIAGNOSIS — G7289 Other specified myopathies: Secondary | ICD-10-CM | POA: Diagnosis not present

## 2022-01-08 DIAGNOSIS — D849 Immunodeficiency, unspecified: Secondary | ICD-10-CM | POA: Diagnosis not present

## 2022-01-09 DIAGNOSIS — D899 Disorder involving the immune mechanism, unspecified: Secondary | ICD-10-CM | POA: Diagnosis not present

## 2022-01-09 DIAGNOSIS — G7289 Other specified myopathies: Secondary | ICD-10-CM | POA: Diagnosis not present

## 2022-01-09 DIAGNOSIS — Z79899 Other long term (current) drug therapy: Secondary | ICD-10-CM | POA: Diagnosis not present

## 2022-02-05 DIAGNOSIS — G7289 Other specified myopathies: Secondary | ICD-10-CM | POA: Diagnosis not present

## 2022-02-11 DIAGNOSIS — G7281 Critical illness myopathy: Secondary | ICD-10-CM | POA: Diagnosis not present

## 2022-02-11 DIAGNOSIS — R7401 Elevation of levels of liver transaminase levels: Secondary | ICD-10-CM | POA: Diagnosis not present

## 2022-02-11 DIAGNOSIS — R768 Other specified abnormal immunological findings in serum: Secondary | ICD-10-CM | POA: Diagnosis not present

## 2022-02-19 DIAGNOSIS — L309 Dermatitis, unspecified: Secondary | ICD-10-CM | POA: Diagnosis not present

## 2022-02-19 DIAGNOSIS — Z85828 Personal history of other malignant neoplasm of skin: Secondary | ICD-10-CM | POA: Diagnosis not present

## 2022-02-19 DIAGNOSIS — D485 Neoplasm of uncertain behavior of skin: Secondary | ICD-10-CM | POA: Diagnosis not present

## 2022-02-19 DIAGNOSIS — L570 Actinic keratosis: Secondary | ICD-10-CM | POA: Diagnosis not present

## 2022-02-20 DIAGNOSIS — C44329 Squamous cell carcinoma of skin of other parts of face: Secondary | ICD-10-CM | POA: Diagnosis not present

## 2022-02-20 DIAGNOSIS — B359 Dermatophytosis, unspecified: Secondary | ICD-10-CM | POA: Diagnosis not present

## 2022-02-20 DIAGNOSIS — D0422 Carcinoma in situ of skin of left ear and external auricular canal: Secondary | ICD-10-CM | POA: Diagnosis not present

## 2022-02-28 DIAGNOSIS — C44222 Squamous cell carcinoma of skin of right ear and external auricular canal: Secondary | ICD-10-CM | POA: Diagnosis not present

## 2022-02-28 DIAGNOSIS — C44329 Squamous cell carcinoma of skin of other parts of face: Secondary | ICD-10-CM | POA: Diagnosis not present

## 2022-03-05 DIAGNOSIS — G7289 Other specified myopathies: Secondary | ICD-10-CM | POA: Diagnosis not present

## 2022-03-05 DIAGNOSIS — Z1159 Encounter for screening for other viral diseases: Secondary | ICD-10-CM | POA: Diagnosis not present

## 2022-04-02 ENCOUNTER — Other Ambulatory Visit: Payer: Self-pay | Admitting: *Deleted

## 2022-04-02 DIAGNOSIS — G7289 Other specified myopathies: Secondary | ICD-10-CM | POA: Diagnosis not present

## 2022-04-02 NOTE — Patient Outreach (Signed)
  Care Coordination   04/02/2022  Name: Jacob Hunter MRN: [id number] DOB: 01-04-1956   Care Coordination Outreach Attempts:  An unsuccessful telephone outreach was attempted today to offer the patient information about available care coordination services as a benefit of their health plan. HIPAA compliant messages left on voicemail, providing contact information for CSW, encouraging patient to return CSW's call at his earliest convenience.  Follow Up Plan:  Additional outreach attempts will be made to offer the patient care coordination information and services.   Encounter Outcome:  No Answer.   Care Coordination Interventions Activated:  No.    Care Coordination Interventions:  No, not indicated.    Nat Christen, BSW, MSW, LCSW  Licensed Education officer, [facility]  M[address] [place], [place] [address] Physical Address[address] [place], [place] [address] Toll Free Main # [phone] Fax # [phone] Cell # [phone] [email or web address]

## 2022-04-09 ENCOUNTER — Telehealth: Payer: Self-pay | Admitting: *Deleted

## 2022-04-09 ENCOUNTER — Encounter: Payer: Self-pay | Admitting: *Deleted

## 2022-04-09 NOTE — Patient Outreach (Signed)
  Care Coordination   Initial Visit Note   04/09/2022 Name: Jacob Hunter MRN: [id number] DOB: Apr 18, 1956  Jacob Hunter is a 66 y.o. year old male who sees Asencion Noble, MD for primary care. I spoke with  Dante Gang by phone today.  What matters to the patients health and wellness today?  Ongoing self health management    Goals Addressed             This Visit's Progress    Care Coordination Services (no follow-up needed)       Care Coordination Interventions: Reviewed medications with patient and discussed affordability Provided patient and/or caregiver with verbal information about [place] (community resource) Assessed social determinant of health barriers Encouraged patient to request a referral from PCP if services are needed in the future         SDOH assessments and interventions completed:  Yes  SDOH Interventions Today    Flowsheet Row Most Recent Value  SDOH Interventions   Financial Strain Interventions Intervention Not Indicated  Housing Interventions Intervention Not Indicated  Transportation Interventions Intervention Not Indicated        Care Coordination Interventions Activated:  Yes  Care Coordination Interventions:  Yes, provided   Follow up plan: No further intervention required.   Encounter Outcome:  No Answer   Chong Sicilian, BSN, RN-BC [place] / [facility] Pharmacist, community Dial: [phone]

## 2022-04-25 DIAGNOSIS — G7289 Other specified myopathies: Secondary | ICD-10-CM | POA: Diagnosis not present

## 2022-04-25 DIAGNOSIS — Z79620 Long term (current) use of immunosuppressive biologic: Secondary | ICD-10-CM | POA: Diagnosis not present

## 2022-04-30 DIAGNOSIS — G7289 Other specified myopathies: Secondary | ICD-10-CM | POA: Diagnosis not present

## 2022-05-09 DIAGNOSIS — Z79620 Long term (current) use of immunosuppressive biologic: Secondary | ICD-10-CM | POA: Diagnosis not present

## 2022-05-09 DIAGNOSIS — G7289 Other specified myopathies: Secondary | ICD-10-CM | POA: Diagnosis not present

## 2022-05-28 DIAGNOSIS — G7289 Other specified myopathies: Secondary | ICD-10-CM | POA: Diagnosis not present

## 2022-06-09 ENCOUNTER — Emergency Department (HOSPITAL_COMMUNITY)
Admission: EM | Admit: 2022-06-09 | Discharge: 2022-06-09 | Disposition: A | Discharge status: Home / Self Care | Payer: PPO | Attending: Emergency Medicine | Admitting: Emergency Medicine

## 2022-06-09 ENCOUNTER — Other Ambulatory Visit: Payer: Self-pay

## 2022-06-09 ENCOUNTER — Encounter (HOSPITAL_COMMUNITY): Payer: Self-pay

## 2022-06-09 ENCOUNTER — Emergency Department (HOSPITAL_COMMUNITY): Payer: PPO

## 2022-06-09 DIAGNOSIS — S81811A Laceration without foreign body, right lower leg, initial encounter: Secondary | ICD-10-CM

## 2022-06-09 DIAGNOSIS — Z23 Encounter for immunization: Secondary | ICD-10-CM | POA: Insufficient documentation

## 2022-06-09 DIAGNOSIS — Z7982 Long term (current) use of aspirin: Secondary | ICD-10-CM | POA: Diagnosis not present

## 2022-06-09 DIAGNOSIS — S8991XA Unspecified injury of right lower leg, initial encounter: Secondary | ICD-10-CM | POA: Diagnosis present

## 2022-06-09 DIAGNOSIS — W232XXA Caught, crushed, jammed or pinched between a moving and stationary object, initial encounter: Secondary | ICD-10-CM | POA: Insufficient documentation

## 2022-06-09 MED ORDER — LIDOCAINE-EPINEPHRINE (PF) 2 %-1:200000 IJ SOLN
10.0000 mL | Freq: Once | INTRAMUSCULAR | Status: AC
  Administered 2022-06-09: 10 mL
  Filled 2022-06-09: qty 20

## 2022-06-09 MED ORDER — HYDROCODONE-ACETAMINOPHEN 5-325 MG PO TABS: 1.0000 | ORAL_TABLET | Freq: Four times a day (QID) | ORAL | 0 refills | Status: DC | PRN

## 2022-06-09 MED ORDER — TETANUS-DIPHTH-ACELL PERTUSSIS 5-2.5-18.5 LF-MCG/0.5 IM SUSY
0.5000 mL | PREFILLED_SYRINGE | Freq: Once | INTRAMUSCULAR | Status: AC
  Administered 2022-06-09: 0.5 mL via INTRAMUSCULAR
  Filled 2022-06-09: qty 0.5

## 2022-06-09 MED ORDER — OXYCODONE-ACETAMINOPHEN 5-325 MG PO TABS
1.0000 | ORAL_TABLET | Freq: Once | ORAL | Status: AC
  Administered 2022-06-09: 1 via ORAL
  Filled 2022-06-09: qty 1

## 2022-06-09 NOTE — Discharge Instructions (Addendum)
As we discussed, 12 stitches and several Steri-Strips were placed in your wound to assist with healing.  Unfortunately, given the friable nature of your skin, it was quite difficult to get the wound to come together properly.  I do have some concern that your stitches may pull through your skin before they are ready to be removed, please keep a close eye on this and if any signs of complication follow-up with your primary care doctor at your earliest convenience.  Return if development of any new or worsening symptoms.

## 2022-06-09 NOTE — ED Notes (Signed)
Wound irrigated and suture cart at bedside

## 2022-06-09 NOTE — ED Provider Notes (Signed)
[facility] EMERGENCY DEPARTMENT Provider Note   CSN: [id number] Arrival date & time: 06/09/22  1411     History  Chief Complaint  Patient presents with   Laceration    Jacob Hunter is a 66 y.o. male.  Patient with history of polymyositis presents today with complaints of right lower leg injury. States that immediately prior to arrival today he went to close his car door and the corner of the door caught his right shin and caused a laceration to same. States that bleeding has been minimal. Has been able to walk since but with pain. He is not on anticoagulation. Unsure last tetanus. No other injuries or complaints.   The history is provided by the patient. No language interpreter was used.  Laceration      Home Medications Prior to Admission medications   Medication Sig Start Date End Date Taking? Authorizing Provider  acetaminophen (TYLENOL) 500 MG tablet Take 1,000 mg by mouth every 6 (six) hours as needed for moderate pain.    [provider]  ascorbic acid (VITAMIN C) 1000 MG tablet Take 1,000 mg by mouth daily.    [provider]  aspirin 81 MG EC tablet Take 81 mg by mouth daily. Swallow whole.    [provider]  calcium carbonate (TUMS - DOSED IN MG ELEMENTAL CALCIUM) 500 MG chewable tablet Chew 1 tablet by mouth daily as needed for heartburn.    [provider]  calcium-vitamin D (OSCAL WITH D) 500-200 MG-UNIT tablet Take 1 tablet by mouth daily with breakfast. 07/30/19   Roxan Hockey, MD  cholecalciferol (VITAMIN D3) 25 MCG (1000 UNIT) tablet Take 1,000 Units by mouth daily.    [provider]  Coenzyme Q10 (CO Q 10) 100 MG CAPS Take 100 mg by mouth daily.    [provider]  FLUoxetine (PROZAC) 20 MG capsule Take 20 mg by mouth daily.    [provider]  folic acid (FOLVITE) 1 MG tablet Take 1 tablet (1 mg total) by mouth daily. 07/29/19   Roxan Hockey, MD  methocarbamol (ROBAXIN) 500 MG tablet  Take 1 tablet (500 mg total) by mouth 4 (four) times daily. 10/05/20   Drue Novel, PA  methotrexate (RHEUMATREX) 2.5 MG tablet Take 25 mg by mouth every Monday.     [provider]  oxyCODONE (OXY IR/ROXICODONE) 5 MG immediate release tablet Take 5 mg by mouth every 4 (four) hours as needed for pain.    [provider]  oxyCODONE-acetaminophen (PERCOCET) 5-325 MG tablet Take 2 tablets by mouth every 4 (four) hours as needed. 12/01/20   Veryl Speak, MD  oxyCODONE-acetaminophen (PERCOCET) 5-325 MG tablet Take 1-2 tablets by mouth every 6 (six) hours as needed. 12/01/20   Veryl Speak, MD  predniSONE (DELTASONE) 10 MG tablet Take 10 mg by mouth daily with breakfast.    [provider]  PRESCRIPTION MEDICATION IVIG Antibodies 2 visits every 4 weeks    [provider]  riTUXimab (RITUXAN) 100 MG/10ML injection Inject 1,000 mg into the vein See admin instructions. 2 times a month in March and September    [provider]  tamsulosin (FLOMAX) 0.4 MG CAPS capsule Take 1 capsule (0.4 mg total) by mouth daily. 12/01/20   Veryl Speak, MD  vitamin B-12 (CYANOCOBALAMIN) 1000 MCG tablet Take 1,000 mcg by mouth daily.    [provider]      Allergies    Methylprednisolone, Demerol [meperidine], and Statins    Review of  Systems   Review of Systems  Musculoskeletal:  Positive for arthralgias.  Skin:  Positive for wound.  All other systems reviewed and are negative.   Physical Exam Updated Vital Signs BP (!) 163/92 (BP Location: Right Arm)   Pulse 84   Temp 97.8 F (36.6 C) (Oral)   Resp (!) 28   Ht 6\' 1"  (1.854 m)   Wt 101.2 kg   SpO2 100%   BMI 29.42 kg/m  Physical Exam Vitals and nursing note reviewed.  Constitutional:      General: He is not in acute distress.    Appearance: Normal appearance. He is normal weight. He is not ill-appearing, toxic-appearing or diaphoretic.  HENT:     Head: Normocephalic and atraumatic.   Cardiovascular:     Rate and Rhythm: Normal rate.  Pulmonary:     Effort: Pulmonary effort is normal. No respiratory distress.  Musculoskeletal:        General: Normal range of motion.     Cervical back: Normal range of motion.  Skin:    General: Skin is warm and dry.     Capillary Refill: Capillary refill takes less than 2 seconds.     Comments: Gaping laceration noted to the right lower shin. No active bleeding. DP and PT pulses intact and 2+. Distal sensation intact. Capillary refill less than 2 seconds. Compartments soft. See images below for further  Neurological:     General: No focal deficit present.     Mental Status: He is alert.  Psychiatric:        Mood and Affect: Mood normal.        Behavior: Behavior normal.     ED Results / Procedures / Treatments   Labs (all labs ordered are listed, but only abnormal results are displayed) Labs Reviewed - No data to display  EKG None  Radiology DG Tibia/Fibula Right  Result Date: 06/09/2022 CLINICAL DATA:  Laceration to the right lower leg after slamming a car door on the leg. EXAM: RIGHT TIBIA AND FIBULA - 2 VIEW COMPARISON:  None Available. FINDINGS: There is no evidence of fracture. A posterior calcaneal enthesophyte is noted. There is a laceration to the anterior aspect of the leg. No radiopaque foreign body is identified. IMPRESSION: Laceration to the anterior aspect of the leg without evidence of fracture or radiopaque foreign body. Electronically Signed   By: Zerita Boers M.D.   On: 06/09/2022 14:53    Procedures .Marland KitchenLaceration Repair  Date/Time: 06/09/2022 5:42 PM  Performed by: Bud Face, PA-C Authorized by: Bud Face, PA-C   Consent:    Consent obtained:  Verbal   Consent given by:  Patient   Risks, benefits, and alternatives were discussed: yes     Risks discussed:  Infection, pain, retained foreign body, tendon damage, vascular damage, poor wound healing, poor cosmetic result, need for additional  repair and nerve damage   Alternatives discussed:  No treatment, delayed treatment, referral and observation Universal protocol:    Procedure explained and questions answered to patient or proxy's satisfaction: yes     Imaging studies available: yes     Patient identity confirmed:  Verbally with patient Anesthesia:    Anesthesia method:  Local infiltration   Local anesthetic:  Lidocaine 2% WITH epi Laceration details:    Location:  Leg   Leg location:  R lower leg   Length (cm):  13   Depth (mm):  4 Exploration:    Imaging obtained: x-ray  Imaging outcome: foreign body not noted     Wound exploration: wound explored through full range of motion   Treatment:    Area cleansed with:  Soap and water   Amount of cleaning:  Extensive   Irrigation solution:  Sterile saline   Irrigation volume:  1 L   Irrigation method:  Pressure wash Skin repair:    Repair method:  Sutures and Steri-Strips   Suture size:  3-0   Suture material:  Prolene   Suture technique:  Horizontal mattress, simple interrupted and vertical mattress   Number of sutures:  12   Number of Steri-Strips:  15 Approximation:    Approximation:  Close Repair type:    Repair type:  Complex Post-procedure details:    Dressing:  Sterile dressing and bulky dressing   Procedure completion:  Tolerated with difficulty Comments:     Patient on chronic steroids with unfortunately extremely friable skin and therefore despite extensive attempts, unable to fully close. Additionally some concern that existing sutures will pull through the patients skin and cause dehiscence, therefore steri-strips placed over wound as well.      Medications Ordered in ED Medications  lidocaine-EPINEPHrine (XYLOCAINE W/EPI) 2 %-1:200000 (PF) injection 10 mL (has no administration in time range)  Tdap (BOOSTRIX) injection 0.5 mL (has no administration in time range)  oxyCODONE-acetaminophen (PERCOCET/ROXICET) 5-325 MG per tablet 1 tablet (1  tablet Oral Given 06/09/22 1459)    ED Course/ Medical Decision Making/ A&P                           Medical Decision Making Amount and/or Complexity of Data Reviewed Radiology: ordered.  Risk Prescription drug management.   Patient presents today with complaints of right lower leg wound.  He is afebrile, nontoxic-appearing, and in no acute distress with reassuring vital signs.  Physical exam reveals gaping lower extremity wound.  No signs of foreign body.  X-ray imaging obtained of same which was reassuring for acute findings.  I personally reviewed and interpreted this imaging and agree with radiology interpretation. Pressure irrigation performed of wound. Wound explored and base of wound visualized in a bloodless field without evidence of foreign body.  Laceration occurred < 8 hours prior to repair.  As stated above, patient on chronic steroids with unfortunately extremely friable skin and therefore despite extensive attempts, unable to fully close. Additionally some concern that existing sutures will pull through the patients skin and cause dehiscence, therefore steri-strips placed over wound as well.  I have informed the patient of this concern and recommended close monitoring of the wound for any changes as well as close PCP follow-up for wound check. Tdap updated.  Discussed suture home care with patient and answered questions. Pt to follow-up for wound check and suture removal in 10-14 days; they are to return to the ED sooner for signs of infection. Pt is hemodynamically stable with no complaints prior to dc. Also given Norco pre-pack to go home with per patients request. PDMP reviewed. Patient understanding and amenable with plan, educated on red flag symptoms that would prompt immediate return.  Patient discharged in stable condition.   This is a shared visit with supervising physician Dr. Roderic Palau who has independently evaluated patient & provided guidance in  evaluation/management/disposition, in agreement with care     Final Clinical Impression(s) / ED Diagnoses Final diagnoses:  Laceration of right lower extremity, initial encounter    Rx / DC Orders ED Discharge Orders  Ordered    HYDROcodone-acetaminophen (NORCO/VICODIN) 5-325 MG tablet  Every 6 hours PRN        06/09/22 1759          An After Visit Summary was printed and given to the patient.     Nestor Lewandowsky 06/09/22 1806    Milton Ferguson, MD 06/11/22 1045

## 2022-06-09 NOTE — ED Triage Notes (Signed)
Pt presents to ED via car for laceration to right lower leg, states slammed car door on leg. Bleeding controlled at this time.

## 2022-06-10 MED FILL — Hydrocodone-Acetaminophen Tab 5-325 MG: ORAL | Qty: 6 | Status: AC

## 2022-06-12 DIAGNOSIS — S81801A Unspecified open wound, right lower leg, initial encounter: Secondary | ICD-10-CM | POA: Diagnosis not present

## 2022-06-12 DIAGNOSIS — Z23 Encounter for immunization: Secondary | ICD-10-CM | POA: Diagnosis not present

## 2022-06-24 DIAGNOSIS — S81801A Unspecified open wound, right lower leg, initial encounter: Secondary | ICD-10-CM | POA: Diagnosis not present

## 2022-06-25 DIAGNOSIS — G7289 Other specified myopathies: Secondary | ICD-10-CM | POA: Diagnosis not present

## 2022-07-01 DIAGNOSIS — H4311 Vitreous hemorrhage, right eye: Secondary | ICD-10-CM | POA: Diagnosis not present

## 2022-07-01 DIAGNOSIS — H43811 Vitreous degeneration, right eye: Secondary | ICD-10-CM | POA: Diagnosis not present

## 2022-07-01 DIAGNOSIS — H26493 Other secondary cataract, bilateral: Secondary | ICD-10-CM | POA: Diagnosis not present

## 2022-07-23 DIAGNOSIS — G7289 Other specified myopathies: Secondary | ICD-10-CM | POA: Diagnosis not present

## 2022-07-23 DIAGNOSIS — M10071 Idiopathic gout, right ankle and foot: Secondary | ICD-10-CM | POA: Diagnosis not present

## 2022-08-16 DIAGNOSIS — H26493 Other secondary cataract, bilateral: Secondary | ICD-10-CM | POA: Diagnosis not present

## 2022-08-16 DIAGNOSIS — H43811 Vitreous degeneration, right eye: Secondary | ICD-10-CM | POA: Diagnosis not present

## 2022-08-16 DIAGNOSIS — H4311 Vitreous hemorrhage, right eye: Secondary | ICD-10-CM | POA: Diagnosis not present

## 2022-08-20 DIAGNOSIS — Z7969 Long term (current) use of other immunomodulators and immunosuppressants: Secondary | ICD-10-CM | POA: Diagnosis not present

## 2022-08-20 DIAGNOSIS — G7289 Other specified myopathies: Secondary | ICD-10-CM | POA: Diagnosis not present

## 2022-08-21 DIAGNOSIS — Z1283 Encounter for screening for malignant neoplasm of skin: Secondary | ICD-10-CM | POA: Diagnosis not present

## 2022-08-21 DIAGNOSIS — D239 Other benign neoplasm of skin, unspecified: Secondary | ICD-10-CM | POA: Diagnosis not present

## 2022-08-21 DIAGNOSIS — Z85828 Personal history of other malignant neoplasm of skin: Secondary | ICD-10-CM | POA: Diagnosis not present

## 2022-08-21 DIAGNOSIS — L570 Actinic keratosis: Secondary | ICD-10-CM | POA: Diagnosis not present

## 2022-09-17 DIAGNOSIS — G7289 Other specified myopathies: Secondary | ICD-10-CM | POA: Diagnosis not present

## 2022-09-26 ENCOUNTER — Encounter: Payer: Self-pay | Admitting: *Deleted

## 2022-09-26 DIAGNOSIS — F419 Anxiety disorder, unspecified: Secondary | ICD-10-CM | POA: Diagnosis not present

## 2022-09-26 DIAGNOSIS — D509 Iron deficiency anemia, unspecified: Secondary | ICD-10-CM | POA: Diagnosis not present

## 2022-09-26 DIAGNOSIS — E785 Hyperlipidemia, unspecified: Secondary | ICD-10-CM | POA: Diagnosis not present

## 2022-09-26 DIAGNOSIS — M5186 Other intervertebral disc disorders, lumbar region: Secondary | ICD-10-CM | POA: Diagnosis not present

## 2022-09-26 DIAGNOSIS — K760 Fatty (change of) liver, not elsewhere classified: Secondary | ICD-10-CM | POA: Diagnosis not present

## 2022-09-26 DIAGNOSIS — G7289 Other specified myopathies: Secondary | ICD-10-CM | POA: Diagnosis not present

## 2022-09-26 DIAGNOSIS — Z79899 Other long term (current) drug therapy: Secondary | ICD-10-CM | POA: Diagnosis not present

## 2022-09-26 DIAGNOSIS — Z125 Encounter for screening for malignant neoplasm of prostate: Secondary | ICD-10-CM | POA: Diagnosis not present

## 2022-09-26 DIAGNOSIS — M1000 Idiopathic gout, unspecified site: Secondary | ICD-10-CM | POA: Diagnosis not present

## 2022-10-03 ENCOUNTER — Ambulatory Visit (HOSPITAL_COMMUNITY)
Admission: RE | Admit: 2022-10-03 | Discharge: 2022-10-03 | Disposition: A | Discharge status: Home / Self Care | Payer: PPO | Source: Ambulatory Visit | Attending: Internal Medicine | Admitting: Internal Medicine

## 2022-10-03 ENCOUNTER — Other Ambulatory Visit: Payer: Self-pay | Admitting: Internal Medicine

## 2022-10-03 ENCOUNTER — Other Ambulatory Visit (HOSPITAL_COMMUNITY): Payer: Self-pay | Admitting: Internal Medicine

## 2022-10-03 DIAGNOSIS — R310 Gross hematuria: Secondary | ICD-10-CM

## 2022-10-03 DIAGNOSIS — G7289 Other specified myopathies: Secondary | ICD-10-CM | POA: Diagnosis not present

## 2022-10-03 DIAGNOSIS — E785 Hyperlipidemia, unspecified: Secondary | ICD-10-CM | POA: Diagnosis not present

## 2022-10-03 DIAGNOSIS — Z0000 Encounter for general adult medical examination without abnormal findings: Secondary | ICD-10-CM | POA: Diagnosis not present

## 2022-10-03 DIAGNOSIS — G000 Hemophilus meningitis: Secondary | ICD-10-CM | POA: Diagnosis not present

## 2022-10-03 DIAGNOSIS — R0602 Shortness of breath: Secondary | ICD-10-CM | POA: Insufficient documentation

## 2022-10-03 DIAGNOSIS — M109 Gout, unspecified: Secondary | ICD-10-CM | POA: Diagnosis not present

## 2022-10-10 ENCOUNTER — Ambulatory Visit (HOSPITAL_BASED_OUTPATIENT_CLINIC_OR_DEPARTMENT_OTHER): Payer: PPO

## 2022-10-15 DIAGNOSIS — G7289 Other specified myopathies: Secondary | ICD-10-CM | POA: Diagnosis not present

## 2022-10-18 ENCOUNTER — Telehealth: Payer: Self-pay | Admitting: *Deleted

## 2022-10-18 NOTE — Progress Notes (Signed)
  Care Coordination   Note   10/18/2022 Name: Jacob Hunter MRN: [id number] DOB: 12/18/1955  Jacob Hunter is a 67 y.o. year old male who sees Asencion Noble, MD for primary care. I reached out to Dante Gang by phone today to offer care coordination services.  [name] was given information about Care Coordination services today including:   The Care Coordination services include support from the care team which includes your Nurse Coordinator, Clinical Social Worker, or Pharmacist.  The Care Coordination team is here to help remove barriers to the health concerns and goals most important to you. Care Coordination services are voluntary, and the patient may decline or stop services at any time by request to their care team member.   Care Coordination Consent Status: Patient agreed to services and verbal consent obtained.   Follow up plan:  Telephone appointment with care coordination team member scheduled for:  11/01/2022  Encounter Outcome:  Pt. Scheduled  Julian Hy, [place] Direct Dial: [phone]

## 2022-10-23 DIAGNOSIS — G7289 Other specified myopathies: Secondary | ICD-10-CM | POA: Diagnosis not present

## 2022-10-27 ENCOUNTER — Ambulatory Visit (HOSPITAL_BASED_OUTPATIENT_CLINIC_OR_DEPARTMENT_OTHER)
Admission: RE | Admit: 2022-10-27 | Discharge: 2022-10-27 | Disposition: A | Discharge status: Home / Self Care | Payer: PPO | Source: Ambulatory Visit | Attending: Internal Medicine | Admitting: Internal Medicine

## 2022-10-27 DIAGNOSIS — R310 Gross hematuria: Secondary | ICD-10-CM | POA: Diagnosis not present

## 2022-10-27 DIAGNOSIS — N3289 Other specified disorders of bladder: Secondary | ICD-10-CM | POA: Diagnosis not present

## 2022-10-27 DIAGNOSIS — K769 Liver disease, unspecified: Secondary | ICD-10-CM | POA: Diagnosis not present

## 2022-10-27 MED ORDER — IOHEXOL 300 MG/ML  SOLN
100.0000 mL | Freq: Once | INTRAMUSCULAR | Status: AC | PRN
  Administered 2022-10-27: 100 mL via INTRAVENOUS

## 2022-10-29 ENCOUNTER — Other Ambulatory Visit: Payer: Self-pay | Admitting: *Deleted

## 2022-10-29 ENCOUNTER — Encounter: Payer: Self-pay | Admitting: *Deleted

## 2022-10-29 ENCOUNTER — Encounter: Payer: Self-pay | Admitting: Gastroenterology

## 2022-10-29 ENCOUNTER — Other Ambulatory Visit: Payer: Self-pay | Admitting: Gastroenterology

## 2022-10-29 ENCOUNTER — Ambulatory Visit (INDEPENDENT_AMBULATORY_CARE_PROVIDER_SITE_OTHER): Payer: PPO | Admitting: Gastroenterology

## 2022-10-29 ENCOUNTER — Telehealth: Payer: Self-pay

## 2022-10-29 VITALS — BP 125/78 | HR 76 | Temp 97.6°F | Ht 73.0 in | Wt 236.4 lb

## 2022-10-29 DIAGNOSIS — Z860100 Personal history of colon polyps, unspecified: Secondary | ICD-10-CM | POA: Insufficient documentation

## 2022-10-29 DIAGNOSIS — K529 Noninfective gastroenteritis and colitis, unspecified: Secondary | ICD-10-CM

## 2022-10-29 DIAGNOSIS — D649 Anemia, unspecified: Secondary | ICD-10-CM | POA: Diagnosis not present

## 2022-10-29 MED ORDER — RIFAXIMIN 550 MG PO TABS: 550.0000 mg | ORAL_TABLET | Freq: Three times a day (TID) | ORAL | 0 refills | Status: AC

## 2022-10-29 MED ORDER — PEG 3350-KCL-NA BICARB-NACL 420 G PO SOLR: 4000.0000 mL | Freq: Once | ORAL | 0 refills | Status: AC

## 2022-10-29 NOTE — Telephone Encounter (Signed)
PA done for Xifaxan 550 mg. Tried/failed: Lopermide, Dx used: IBS-D and K52.9. waiting on a response from Cover My Meds.

## 2022-10-29 NOTE — Telephone Encounter (Signed)
PA approved for Xifaxan 550 mg. Pt sent a message in his MyChart and Manuela Schwartz will scan to pt's chart

## 2022-10-29 NOTE — H&P (View-Only) (Signed)
       Gastroenterology Office Note    Referring Provider: Fagan, Roy, MD Primary Care Physician:  Fagan, Roy, MD  Primary GI: Dr. Rourk    Chief Complaint   Chief Complaint  Patient presents with   Diarrhea    Pt here for diarrhea and colonoscopy visit     History of Present Illness   Willaim M Babson is a 67 y.o. male presenting today prior to colonoscopy due to history of chronic diarrhea. Last seen in 2021. Celiac serologies normal historically. Colonoscopy in 2021 with tubular adenomas and one sessile serrated polyp. Due for 3 year surveillance.    Diarrhea persistent. Takes imodium and will stop up for a few days. Postprandial loose stools, anywhere from 2-4. No rectal bleeding. CT on 3/17 (Sunday) for hematuria but radiologist hasn't read yet. Dicyclomine worsened symptoms. No unexplained weight loss. Few weeks ago had RLQ pain but was associated with hematuria. Not typically with abdominal pain. Stool floats. Has had ongoing diarrhea for years.   2021 colonoscopy: three polyps at splenic, hepatic, and cecum. Tubular adenomas and one sessile serrated polyp.   No FH colon cancer or IBD.   Past Medical History:  Diagnosis Date   Arthritis    Knee pain    Necrotizing myopathy    Pneumonia    Polymyositis (HCC)     Past Surgical History:  Procedure Laterality Date   ANKLE SURGERY Left    COLONOSCOPY N/A 10/05/2019   Rourk: 3 colonic polyps removed, 2 were tubular adenomas, cecal polyp was sessile serrated polyp.  Advised 3-year surveillance colonoscopy.   EYE SURGERY     bil cataracts   KNEE SURGERY Left    arthroscopy x 2   left wrist surgery     POLYPECTOMY  10/05/2019   Procedure: POLYPECTOMY;  Surgeon: Rourk, Robert M, MD;  [facility];  Service: Endoscopy;;   TOTAL KNEE ARTHROPLASTY Left 10/04/2020   Procedure: TOTAL KNEE ARTHROPLASTY;  Surgeon: Collins, Robert, MD;  Location: [facility];  Service: Orthopedics;  Laterality: Left;  adductor canal     Current Outpatient Medications  Medication Sig Dispense Refill   ascorbic acid (VITAMIN C) 1000 MG tablet Take 1,000 mg by mouth daily.     calcium-vitamin D (OSCAL WITH D) 500-200 MG-UNIT tablet Take 1 tablet by mouth daily with breakfast. 30 tablet 2   Coenzyme Q10 (CO Q 10) 100 MG CAPS Take 100 mg by mouth daily.     FLUoxetine (PROZAC) 20 MG capsule Take 20 mg by mouth daily.     folic acid (FOLVITE) 1 MG tablet Take 1 tablet (1 mg total) by mouth daily. 30 tablet 0   methotrexate (RHEUMATREX) 2.5 MG tablet Take 25 mg by mouth every Monday.      Multiple Vitamin (MULTIVITAMIN) tablet Take 1 tablet by mouth daily.     predniSONE (DELTASONE) 10 MG tablet Take 12.5 mg by mouth daily with breakfast.     PRESCRIPTION MEDICATION IVIG Antibodies 2 visits every 4 weeks     rifaximin (XIFAXAN) 550 MG TABS tablet Take 1 tablet (550 mg total) by mouth 3 (three) times daily for 14 days. 42 tablet 0   riTUXimab (RITUXAN) 100 MG/10ML injection Inject 1,000 mg into the vein See admin instructions. 2 times a month in March and September     vitamin B-12 (CYANOCOBALAMIN) 1000 MCG tablet Take 1,000 mcg by mouth daily.     vitamin k 100 MCG tablet Take 150 mcg by   mouth daily.     No current facility-administered medications for this visit.    Allergies as of 10/29/2022 - Review Complete 10/29/2022  Allergen Reaction Noted   Methylprednisolone Anaphylaxis, Shortness Of Breath, and Rash 10/14/2015   Demerol [meperidine] Hives 10/27/2019   Statins Other (See Comments) 10/14/2015    Family History  Problem Relation Age of Onset   Hypertension Mother    Hypertension Father    Hypercholesterolemia Father    Hypercholesterolemia Sister    Breast cancer Sister    Colon cancer Neg Hx    Liver disease Neg Hx    Inflammatory bowel disease Neg Hx     Social History   Socioeconomic History   Marital status: Married    Spouse name: Not on file   Number of children: Not on file   Years of  education: Not on file   Highest education level: Not on file  Occupational History   Not on file  Tobacco Use   Smoking status: Never   Smokeless tobacco: Never  Vaping Use   Vaping Use: Never used  Substance and Sexual Activity   Alcohol use: No    Alcohol/week: 0.0 standard drinks of alcohol    Comment: Last ETOH was 1-2 drinks in 1990.   Drug use: No   Sexual activity: Yes    Birth control/protection: None  Other Topics Concern   Not on file  Social History Narrative   Not on file   Social Determinants of Health   Financial Resource Strain: Low Risk  (04/09/2022)   Overall Financial Resource Strain (CARDIA)    Difficulty of Paying Living Expenses: Not hard at all  Food Insecurity: Not on file  Transportation Needs: No Transportation Needs (04/09/2022)   PRAPARE - Transportation    Lack of Transportation (Medical): No    Lack of Transportation (Non-Medical): No  Physical Activity: Not on file  Stress: Not on file  Social Connections: Not on file  Intimate Partner Violence: Not on file     Review of Systems   Gen: Denies any fever, chills, fatigue, weight loss, lack of appetite.  CV: Denies chest pain, heart palpitations, peripheral edema, syncope.  Resp: Denies shortness of breath at rest or with exertion. Denies wheezing or cough.  GI: see HPI GU : Denies urinary burning, urinary frequency, urinary hesitancy MS: Denies joint pain, muscle weakness, cramps, or limitation of movement.  Derm: Denies rash, itching, dry skin Psych: Denies depression, anxiety, memory loss, and confusion Heme: Denies bruising, bleeding, and enlarged lymph nodes.   Physical Exam   BP 125/78   Pulse 76   Temp 97.6 F (36.4 C)   Ht 6' 1" (1.854 m)   Wt 236 lb 6.4 oz (107.2 kg)   BMI 31.19 kg/m  General:   Alert and oriented. Pleasant and cooperative. Well-nourished and well-developed.  Head:  Normocephalic and atraumatic. Eyes:  Without icterus Ears:  Normal auditory  acuity. Lungs:  Clear to auscultation bilaterally.  Heart:  S1, S2 present without murmurs appreciated.  Abdomen:  +BS, soft, non-tender and non-distended. No HSM noted. No guarding or rebound. No masses appreciated.  Rectal:  Deferred  Msk:  Symmetrical without gross deformities. Normal posture. Extremities:  Without edema. Neurologic:  Alert and  oriented x4;  grossly normal neurologically. Skin:  Intact without significant lesions or rashes. Psych:  Alert and cooperative. Normal mood and affect.   Assessment   Jacob Hunter is a 67 y.o. male presenting today prior to colonoscopy   due to history of chronic diarrhea. Last seen in 2021. Celiac serologies normal historically. Colonoscopy in 2021 with tubular adenomas and one sessile serrated polyp. Due for 3 year surveillance.   Chronic diarrhea: celiac serologies negative in the past. Colonoscopy in 2021 but no random biopsies as we were unaware of diarrhea issue at the time. No improvement with dicyclomine. Imodium prn. Will trial Xifaxan course. I have also ordered fecal elastase and fecal calprotectin. Suspect dealing with IBS but need to exclude other etiologies.   Mild anemia: chronic. Suspect due to chronic disease. Check iron studies. Will need EGD if evidence for IDA. No upper GI symptoms.      PLAN   Xifaxan 550 mg po TID for 14 days  Iron studies, fecal cal, fecal elastase  Proceed with ileocolonoscopy +/- EGD (EGD if evidence for IDA) by Dr. Rourk in near future: the risks, benefits, and alternatives have been discussed with the patient in detail. The patient states understanding and desires to proceed. Recommend random colonic biopsies.   Follow-up in 2 months.    Aislinn Feliz W. Montrae Braithwaite, PhD, ANP-BC [facility]    

## 2022-10-29 NOTE — Progress Notes (Signed)
Gastroenterology Office Note    Referring Provider: Asencion Noble, MD Primary Care Physician:  Asencion Noble, MD  Primary GI: Dr. Gala Romney    Chief Complaint   Chief Complaint  Patient presents with   Diarrhea    Pt here for diarrhea and colonoscopy visit     History of Present Illness   TOMMY EWING is a 67 y.o. male presenting today prior to colonoscopy due to history of chronic diarrhea. Last seen in 2021. Celiac serologies normal historically. Colonoscopy in 2021 with tubular adenomas and one sessile serrated polyp. Due for 3 year surveillance.    Diarrhea persistent. Takes imodium and will stop up for a few days. Postprandial loose stools, anywhere from 2-4. No rectal bleeding. CT on 3/17 (Sunday) for hematuria but radiologist hasn't read yet. Dicyclomine worsened symptoms. No unexplained weight loss. Few weeks ago had RLQ pain but was associated with hematuria. Not typically with abdominal pain. Stool floats. Has had ongoing diarrhea for years.   2021 colonoscopy: three polyps at splenic, hepatic, and cecum. Tubular adenomas and one sessile serrated polyp.   No FH colon cancer or IBD.   Past Medical History:  Diagnosis Date   Arthritis    Knee pain    Necrotizing myopathy    Pneumonia    Polymyositis ([place])     Past Surgical History:  Procedure Laterality Date   ANKLE SURGERY Left    COLONOSCOPY N/A 10/05/2019   Rourk: 3 colonic polyps removed, 2 were tubular adenomas, cecal polyp was sessile serrated polyp.  Advised 3-year surveillance colonoscopy.   EYE SURGERY     bil cataracts   KNEE SURGERY Left    arthroscopy x 2   left wrist surgery     POLYPECTOMY  10/05/2019   Procedure: POLYPECTOMY;  Surgeon: Daneil Dolin, MD;  [facility];  Service: Endoscopy;;   TOTAL KNEE ARTHROPLASTY Left 10/04/2020   Procedure: TOTAL KNEE ARTHROPLASTY;  Surgeon: Sydnee Cabal, MD;  Location: [facility];  Service: Orthopedics;  Laterality: Left;  adductor canal     Current Outpatient Medications  Medication Sig Dispense Refill   ascorbic acid (VITAMIN C) 1000 MG tablet Take 1,000 mg by mouth daily.     calcium-vitamin D (OSCAL WITH D) 500-200 MG-UNIT tablet Take 1 tablet by mouth daily with breakfast. 30 tablet 2   Coenzyme Q10 (CO Q 10) 100 MG CAPS Take 100 mg by mouth daily.     FLUoxetine (PROZAC) 20 MG capsule Take 20 mg by mouth daily.     folic acid (FOLVITE) 1 MG tablet Take 1 tablet (1 mg total) by mouth daily. 30 tablet 0   methotrexate (RHEUMATREX) 2.5 MG tablet Take 25 mg by mouth every Monday.      Multiple Vitamin (MULTIVITAMIN) tablet Take 1 tablet by mouth daily.     predniSONE (DELTASONE) 10 MG tablet Take 12.5 mg by mouth daily with breakfast.     PRESCRIPTION MEDICATION IVIG Antibodies 2 visits every 4 weeks     rifaximin (XIFAXAN) 550 MG TABS tablet Take 1 tablet (550 mg total) by mouth 3 (three) times daily for 14 days. 42 tablet 0   riTUXimab (RITUXAN) 100 MG/10ML injection Inject 1,000 mg into the vein See admin instructions. 2 times a month in March and September     vitamin B-12 (CYANOCOBALAMIN) 1000 MCG tablet Take 1,000 mcg by mouth daily.     vitamin k 100 MCG tablet Take 150 mcg by  mouth daily.     No current facility-administered medications for this visit.    Allergies as of 10/29/2022 - Review Complete 10/29/2022  Allergen Reaction Noted   Methylprednisolone Anaphylaxis, Shortness Of Breath, and Rash 10/14/2015   Demerol [meperidine] Hives 10/27/2019   Statins Other (See Comments) 10/14/2015    Family History  Problem Relation Age of Onset   Hypertension Mother    Hypertension Father    Hypercholesterolemia Father    Hypercholesterolemia Sister    Breast cancer Sister    Colon cancer Neg Hx    Liver disease Neg Hx    Inflammatory bowel disease Neg Hx     Social History   Socioeconomic History   Marital status: Married    Spouse name: Not on file   Number of children: Not on file   Years of  education: Not on file   Highest education level: Not on file  Occupational History   Not on file  Tobacco Use   Smoking status: Never   Smokeless tobacco: Never  Vaping Use   Vaping Use: Never used  Substance and Sexual Activity   Alcohol use: No    Alcohol/week: 0.0 standard drinks of alcohol    Comment: Last ETOH was 1-2 drinks in 1990.   Drug use: No   Sexual activity: Yes    Birth control/protection: None  Other Topics Concern   Not on file  Social History Narrative   Not on file   Social Determinants of Health   Financial Resource Strain: Low Risk  (04/09/2022)   Overall Financial Resource Strain (CARDIA)    Difficulty of Paying Living Expenses: Not hard at all  Food Insecurity: Not on file  Transportation Needs: No Transportation Needs (04/09/2022)   PRAPARE - Transportation    Lack of Transportation (Medical): No    Lack of Transportation (Non-Medical): No  Physical Activity: Not on file  Stress: Not on file  Social Connections: Not on file  Intimate Partner Violence: Not on file     Review of Systems   Gen: Denies any fever, chills, fatigue, weight loss, lack of appetite.  CV: Denies chest pain, heart palpitations, peripheral edema, syncope.  Resp: Denies shortness of breath at rest or with exertion. Denies wheezing or cough.  GI: see HPI GU : Denies urinary burning, urinary frequency, urinary hesitancy MS: Denies joint pain, muscle weakness, cramps, or limitation of movement.  Derm: Denies rash, itching, dry skin Psych: Denies depression, anxiety, memory loss, and confusion Heme: Denies bruising, bleeding, and enlarged lymph nodes.   Physical Exam   BP 125/78   Pulse 76   Temp 97.6 F (36.4 C)   Ht 6\' 1"  (1.854 m)   Wt 236 lb 6.4 oz (107.2 kg)   BMI 31.19 kg/m  General:   Alert and oriented. Pleasant and cooperative. Well-nourished and well-developed.  Head:  Normocephalic and atraumatic. Eyes:  Without icterus Ears:  Normal auditory  acuity. Lungs:  Clear to auscultation bilaterally.  Heart:  S1, S2 present without murmurs appreciated.  Abdomen:  +BS, soft, non-tender and non-distended. No HSM noted. No guarding or rebound. No masses appreciated.  Rectal:  Deferred  Msk:  Symmetrical without gross deformities. Normal posture. Extremities:  Without edema. Neurologic:  Alert and  oriented x4;  grossly normal neurologically. Skin:  Intact without significant lesions or rashes. Psych:  Alert and cooperative. Normal mood and affect.   Assessment   KUNAAL MALAVE is a 67 y.o. male presenting today prior to colonoscopy  due to history of chronic diarrhea. Last seen in 2021. Celiac serologies normal historically. Colonoscopy in 2021 with tubular adenomas and one sessile serrated polyp. Due for 3 year surveillance.   Chronic diarrhea: celiac serologies negative in the past. Colonoscopy in 2021 but no random biopsies as we were unaware of diarrhea issue at the time. No improvement with dicyclomine. Imodium prn. Will trial Xifaxan course. I have also ordered fecal elastase and fecal calprotectin. Suspect dealing with IBS but need to exclude other etiologies.   Mild anemia: chronic. Suspect due to chronic disease. Check iron studies. Will need EGD if evidence for IDA. No upper GI symptoms.      PLAN   Xifaxan 550 mg po TID for 14 days  Iron studies, fecal cal, fecal elastase  Proceed with ileocolonoscopy +/- EGD (EGD if evidence for IDA) by Dr. Gala Romney in near future: the risks, benefits, and alternatives have been discussed with the patient in detail. The patient states understanding and desires to proceed. Recommend random colonic biopsies.   Follow-up in 2 months.    Annitta Needs, PhD, ANP-BC [facility]

## 2022-10-29 NOTE — Patient Instructions (Signed)
Please have blood work at [facility] and collect the stool kits to return to [place].  We are arranging a colonoscopy. You might need an upper endoscopy if you have evidence for low iron.   I have sent in Xifaxan to take 3 times a day for 14 days, then stop. Let me know how this works for you. If no improvement, we will start investigating other things!  We will see you in 2 months regardless!  I enjoyed seeing you again today! At our first visit, I mentioned how I value our relationship and want to provide genuine, compassionate, and quality care. You may receive a survey regarding your visit with me, and I welcome your feedback! Thanks so much for taking the time to complete this. I look forward to seeing you again.   Annitta Needs, PhD, ANP-BC [facility]

## 2022-10-30 LAB — IRON,TIBC AND FERRITIN PANEL
Ferritin: 204 ng/mL (ref 30–400)
Iron Saturation: 45 % (ref 15–55)
Iron: 137 ug/dL (ref 38–169)
Total Iron Binding Capacity: 302 ug/dL (ref 250–450)
UIBC: 165 ug/dL (ref 111–343)

## 2022-10-30 NOTE — Telephone Encounter (Signed)
Pt approved for Xifaxan for 10/29/2022-11/12/2022

## 2022-11-01 ENCOUNTER — Encounter: Payer: Self-pay | Admitting: *Deleted

## 2022-11-01 ENCOUNTER — Ambulatory Visit: Payer: Self-pay | Admitting: *Deleted

## 2022-11-01 NOTE — Patient Outreach (Signed)
  Care Coordination   Initial Visit Note   11/01/2022 Name: DWANE VONGUNTEN MRN: [id number] DOB: February 07, 1956  MIROSLAV GANCI is a 67 y.o. year old male who sees Asencion Noble, MD for primary care. I spoke with  Dante Gang by phone today.  What matters to the patients health and wellness today?  Ongoing self management of medical conditions    Goals Addressed             This Visit's Progress    COMPLETED: Care Coordination Services (No Follow-up Required)       Care Coordination Goals: Patient will follow-up with PCP and/or specialist(s) as recommended Patient will take medications as prescribed Patient will reach out to [facility] Management Dept at [phone] with any care coordination or resource needs  Patient will utilize the [facility] 24 Hour Nurse/Concierge Line as needed [phone]         SDOH assessments and interventions completed:  Yes  SDOH Interventions Today    Flowsheet Row Most Recent Value  SDOH Interventions   Food Insecurity Interventions Intervention Not Indicated  Housing Interventions Intervention Not Indicated  Transportation Interventions Intervention Not Indicated  Utilities Interventions Intervention Not Indicated  Financial Strain Interventions Intervention Not Indicated        Care Coordination Interventions:  Yes, provided   Follow up plan: No further intervention required.   Encounter Outcome:  Pt. Visit Completed   Chong Sicilian, BSN, RN-BC RN Care Coordinator [facility] Direct Dial: [phone] Main #: [phone]

## 2022-11-03 LAB — CALPROTECTIN, FECAL: Calprotectin, Fecal: 9 ug/g (ref 0–120)

## 2022-11-03 LAB — PANCREATIC ELASTASE, FECAL: Pancreatic Elastase, Fecal: 467 ug Elast./g (ref >200)

## 2022-11-04 ENCOUNTER — Ambulatory Visit (HOSPITAL_COMMUNITY)
Admission: RE | Admit: 2022-11-04 | Discharge: 2022-11-04 | Disposition: A | Discharge status: Home / Self Care | Payer: PPO | Source: Ambulatory Visit | Attending: Urology | Admitting: Urology

## 2022-11-04 ENCOUNTER — Encounter: Payer: Self-pay | Admitting: Urology

## 2022-11-04 ENCOUNTER — Other Ambulatory Visit: Payer: Self-pay

## 2022-11-04 ENCOUNTER — Emergency Department (HOSPITAL_COMMUNITY)
Admission: EM | Admit: 2022-11-04 | Discharge: 2022-11-04 | Disposition: A | Discharge status: Home / Self Care | Payer: PPO | Attending: Emergency Medicine | Admitting: Emergency Medicine

## 2022-11-04 ENCOUNTER — Emergency Department (HOSPITAL_COMMUNITY): Payer: PPO

## 2022-11-04 ENCOUNTER — Ambulatory Visit (INDEPENDENT_AMBULATORY_CARE_PROVIDER_SITE_OTHER): Payer: PPO | Admitting: Urology

## 2022-11-04 VITALS — BP 104/66 | HR 89

## 2022-11-04 DIAGNOSIS — R1030 Lower abdominal pain, unspecified: Secondary | ICD-10-CM | POA: Diagnosis present

## 2022-11-04 DIAGNOSIS — N390 Urinary tract infection, site not specified: Secondary | ICD-10-CM | POA: Insufficient documentation

## 2022-11-04 DIAGNOSIS — N201 Calculus of ureter: Secondary | ICD-10-CM | POA: Diagnosis not present

## 2022-11-04 DIAGNOSIS — N200 Calculus of kidney: Secondary | ICD-10-CM

## 2022-11-04 DIAGNOSIS — R9431 Abnormal electrocardiogram [ECG] [EKG]: Secondary | ICD-10-CM | POA: Diagnosis not present

## 2022-11-04 DIAGNOSIS — K5730 Diverticulosis of large intestine without perforation or abscess without bleeding: Secondary | ICD-10-CM | POA: Diagnosis not present

## 2022-11-04 DIAGNOSIS — N132 Hydronephrosis with renal and ureteral calculous obstruction: Secondary | ICD-10-CM | POA: Diagnosis not present

## 2022-11-04 DIAGNOSIS — N23 Unspecified renal colic: Secondary | ICD-10-CM | POA: Diagnosis not present

## 2022-11-04 LAB — LIPASE, BLOOD: Lipase: 59 U/L — ABNORMAL HIGH (ref 11–51)

## 2022-11-04 LAB — URINALYSIS, ROUTINE W REFLEX MICROSCOPIC
Bilirubin Urine: NEGATIVE
Glucose, UA: NEGATIVE mg/dL
Ketones, ur: NEGATIVE mg/dL
Leukocytes,Ua: NEGATIVE
Nitrite: NEGATIVE
Protein, ur: 30 mg/dL — AB
RBC / HPF: >50 RBC/hpf (ref 0–5)
Specific Gravity, Urine: 1.016 (ref 1.005–1.030)
pH: 5 (ref 5.0–8.0)

## 2022-11-04 LAB — COMPREHENSIVE METABOLIC PANEL
ALT: 40 U/L (ref 0–44)
AST: 40 U/L (ref 15–41)
Albumin: 3.8 g/dL (ref 3.5–5.0)
Alkaline Phosphatase: 60 U/L (ref 38–126)
Anion gap: 10 (ref 5–15)
BUN: 17 mg/dL (ref 8–23)
CO2: 24 mmol/L (ref 22–32)
Calcium: 9.7 mg/dL (ref 8.9–10.3)
Chloride: 102 mmol/L (ref 98–111)
Creatinine, Ser: 1.16 mg/dL (ref 0.61–1.24)
GFR, Estimated: >60 mL/min (ref >60)
Glucose, Bld: 95 mg/dL (ref 70–99)
Potassium: 3.6 mmol/L (ref 3.5–5.1)
Sodium: 136 mmol/L (ref 135–145)
Total Bilirubin: 0.5 mg/dL (ref 0.3–1.2)
Total Protein: 7.7 g/dL (ref 6.5–8.1)

## 2022-11-04 LAB — CBC
HCT: 40.6 % (ref 39.0–52.0)
Hemoglobin: 13.3 g/dL (ref 13.0–17.0)
MCH: 31.1 pg (ref 26.0–34.0)
MCHC: 32.8 g/dL (ref 30.0–36.0)
MCV: 94.9 fL (ref 80.0–100.0)
Platelets: 192 10*3/uL (ref 150–400)
RBC: 4.28 MIL/uL (ref 4.22–5.81)
RDW: 17.2 % — ABNORMAL HIGH (ref 11.5–15.5)
WBC: 13.4 10*3/uL — ABNORMAL HIGH (ref 4.0–10.5)
nRBC: 0 % (ref 0.0–0.2)

## 2022-11-04 LAB — CBG MONITORING, ED: Glucose-Capillary: 98 mg/dL (ref 70–99)

## 2022-11-04 MED ORDER — ONDANSETRON 8 MG PO TBDP: 8.0000 mg | ORAL_TABLET | Freq: Three times a day (TID) | ORAL | 0 refills | Status: DC | PRN

## 2022-11-04 MED ORDER — HYDROCODONE-ACETAMINOPHEN 5-325 MG PO TABS: 1.0000 | ORAL_TABLET | ORAL | 0 refills | Status: DC | PRN

## 2022-11-04 MED ORDER — CEPHALEXIN 500 MG PO CAPS: 500.0000 mg | ORAL_CAPSULE | Freq: Three times a day (TID) | ORAL | 0 refills | Status: DC

## 2022-11-04 MED ORDER — SODIUM CHLORIDE 0.9 % IV BOLUS
1000.0000 mL | Freq: Once | INTRAVENOUS | Status: AC
  Administered 2022-11-04: 1000 mL via INTRAVENOUS

## 2022-11-04 MED ORDER — TAMSULOSIN HCL 0.4 MG PO CAPS: 0.4000 mg | ORAL_CAPSULE | Freq: Every day | ORAL | 3 refills | Status: DC

## 2022-11-04 MED ORDER — CEPHALEXIN 500 MG PO CAPS
500.0000 mg | ORAL_CAPSULE | Freq: Once | ORAL | Status: AC
  Administered 2022-11-04: 500 mg via ORAL
  Filled 2022-11-04: qty 1

## 2022-11-04 MED ORDER — TAMSULOSIN HCL 0.4 MG PO CAPS: 0.4000 mg | ORAL_CAPSULE | Freq: Every day | ORAL | 0 refills | Status: DC

## 2022-11-04 MED ORDER — ONDANSETRON HCL 4 MG/2ML IJ SOLN
4.0000 mg | Freq: Once | INTRAMUSCULAR | Status: AC
  Administered 2022-11-04: 4 mg via INTRAVENOUS
  Filled 2022-11-04: qty 2

## 2022-11-04 MED ORDER — MORPHINE SULFATE (PF) 4 MG/ML IV SOLN
4.0000 mg | Freq: Once | INTRAVENOUS | Status: AC
  Administered 2022-11-04: 4 mg via INTRAVENOUS
  Filled 2022-11-04: qty 1

## 2022-11-04 MED ORDER — KETOROLAC TROMETHAMINE 30 MG/ML IJ SOLN
30.0000 mg | Freq: Once | INTRAMUSCULAR | Status: AC
  Administered 2022-11-04: 30 mg via INTRAVENOUS
  Filled 2022-11-04: qty 1

## 2022-11-04 NOTE — Telephone Encounter (Signed)
We have no samples of it here, I have called Thurston Hole to see if she could bring some by both offices.

## 2022-11-04 NOTE — Discharge Instructions (Addendum)
Drink plenty of fluids.  For less severe pain, you may take acetaminophen.  Just be careful not to take too much acetaminophen since it is also part of your pain prescription.  Return to the emergency department if pain is not being adequately controlled at home, vomiting is not being adequately controlled at home, or if you develop a fever.

## 2022-11-04 NOTE — Progress Notes (Signed)
11/04/2022 11:01 AM   Jacob Hunter Apr 28, 1956 [id number]  Referring provider: Asencion Noble, MD [address] [place],  [place] [address]  Nephrolithiasis   HPI: Jacob Hunter is a 67yo here for evaluation of nephrolithiasis. He was diagnosed with microhematuria with Dr. Willey Blade and underwent CT which showed a left ureteral calculus and thickened bladder wall. He then developed left flank pain Sunday and presented to the ER which showed a 41mm left mid ureteral calculus. His pain is currently controlled on norco. This is his second stone event. No fevers. IPSS 24 QOL 6. He was started on flomax in the ER.    PMH: Past Medical History:  Diagnosis Date   Arthritis    Knee pain    Necrotizing myopathy    Pneumonia    Polymyositis ([place])     Surgical History: Past Surgical History:  Procedure Laterality Date   ANKLE SURGERY Left    COLONOSCOPY N/A 10/05/2019   [facility]: 3 colonic polyps removed, 2 were tubular adenomas, cecal polyp was sessile serrated polyp.  Advised 3-year surveillance colonoscopy.   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 If you have any questions, ask your nurse or doctor.          ascorbic acid 1000 MG tablet Commonly known as: VITAMIN C Take  1,000 mg by mouth daily.   calcium-vitamin D 500-200 MG-UNIT tablet Commonly known as: OSCAL WITH D Take 1 tablet by mouth daily with breakfast.   cephALEXin 500 MG capsule Commonly known as: KEFLEX Take 1 capsule (500 mg total) by mouth 3 (three) times daily.   Co Q 10 100 MG Caps Take 100 mg by mouth daily.   cyanocobalamin 1000 MCG tablet Commonly known as: VITAMIN B12 Take 1,000 mcg by mouth daily.   FLUoxetine 20 MG capsule Commonly known as: PROZAC Take 20 mg by mouth daily.   folic acid 1 MG tablet Commonly known as: FOLVITE Take 1 tablet (1 mg total) by mouth daily.   HYDROcodone-acetaminophen 5-325 MG tablet Commonly known as: Norco Take 1 tablet by mouth every 4 (four) hours as needed for moderate pain.   HYDROcodone-acetaminophen 5-325 MG tablet Commonly known as: Norco Take 1 tablet by mouth every 4 (four) hours as needed for moderate pain.   methotrexate 2.5 MG tablet Commonly known as: RHEUMATREX Take 25 mg by mouth every Monday.   multivitamin tablet Take 1 tablet by mouth daily.   ondansetron 8 MG disintegrating tablet Commonly known as: ZOFRAN-ODT Take 1 tablet (8 mg total) by mouth every 8 (eight) hours as needed for nausea or vomiting.   predniSONE 10 MG tablet Commonly known as: DELTASONE Take  12.5 mg by mouth daily with breakfast.   PRESCRIPTION MEDICATION IVIG Antibodies 2 visits every 4 weeks   rifaximin 550 MG Tabs tablet Commonly known as: Xifaxan Take 1 tablet (550 mg total) by mouth 3 (three) times daily for 14 days.   riTUXimab 100 MG/10ML injection Commonly known as: RITUXAN Inject 1,000 mg into the vein See admin instructions. 2 times a month in March and September   tamsulosin 0.4 MG Caps capsule Commonly known as: FLOMAX Take 1 capsule (0.4 mg total) by mouth daily.   vitamin k 100 MCG tablet Take 150 mcg by mouth daily.        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 He has never used smokeless tobacco. He reports that he does not drink alcohol and does not use drugs.  ROS: All other review of systems were reviewed and are negative except what is noted above in HPI  Physical Exam: BP 104/66   Pulse 89   Constitutional:  Alert and oriented, No acute distress. HEENT: [place] AT, moist mucus membranes.  Trachea midline, no masses. Cardiovascular: No clubbing, cyanosis, or edema. Respiratory: Normal respiratory effort, no increased work of breathing. GI: Abdomen is soft, nontender, nondistended, no abdominal masses GU: No CVA tenderness.  Lymph: No cervical or inguinal lymphadenopathy. Skin: No rashes, bruises or suspicious lesions. Neurologic: Grossly intact, no focal deficits, moving all 4 extremities. Psychiatric: Normal mood and affect.  Laboratory Data: Lab Results  Component Value Date   WBC 13.4 (H) 11/04/2022   HGB 13.3 11/04/2022   HCT 40.6 11/04/2022   MCV 94.9 11/04/2022   PLT 192 11/04/2022    Lab Results  Component Value Date   CREATININE 1.16 11/04/2022    No results found for: "PSA"  No results found for: "TESTOSTERONE"  No results found for: "HGBA1C"  Urinalysis    Component Value Date/Time   COLORURINE AMBER (A) 11/04/2022 0016   APPEARANCEUR CLOUDY (A) 11/04/2022 0016   LABSPEC 1.016 11/04/2022 0016   PHURINE 5.0 11/04/2022 0016   GLUCOSEU NEGATIVE 11/04/2022 0016   HGBUR LARGE (A) 11/04/2022 0016   BILIRUBINUR  NEGATIVE 11/04/2022 0016   KETONESUR NEGATIVE 11/04/2022 0016   PROTEINUR 30 (A) 11/04/2022 0016   UROBILINOGEN 0.2 01/24/2015 1748   NITRITE NEGATIVE 11/04/2022 0016   LEUKOCYTESUR NEGATIVE 11/04/2022 0016    Lab Results  Component Value Date   BACTERIA RARE (A) 11/04/2022    Pertinent Imaging: Ct stone study yesterday and KUB today: Images reviewed and discussed with the patient  No results found for this or any previous visit.  Results for orders placed during the hospital encounter of 08/05/19  US Venous Img Lower Bilateral (DVT)  Narrative CLINICAL DATA:  Bilateral lower extremity edema.  EXAM: BILATERAL LOWER EXTREMITY VENOUS DOPPLER ULTRASOUND  TECHNIQUE: Gray-scale sonography with graded compression, as well as color Doppler and duplex ultrasound were performed to evaluate  the lower extremity deep venous systems from the level of the common femoral vein and including the common femoral, femoral, profunda femoral, popliteal and calf veins including the posterior tibial, peroneal and gastrocnemius veins when visible. The superficial great saphenous vein was also interrogated. Spectral Doppler was utilized to evaluate flow at rest and with distal augmentation maneuvers in the common femoral, femoral and popliteal veins.  COMPARISON:  None.  FINDINGS: RIGHT LOWER EXTREMITY  Common Femoral Vein: No evidence of thrombus. Normal compressibility, respiratory phasicity and response to augmentation.  Saphenofemoral Junction: No evidence of thrombus. Normal compressibility and flow on color Doppler imaging.  Profunda Femoral Vein: No evidence of thrombus. Normal compressibility and flow on color Doppler imaging.  Femoral Vein: No evidence of thrombus. Normal compressibility, respiratory phasicity and response to augmentation.  Popliteal Vein: No evidence of thrombus. Normal compressibility, respiratory phasicity and response to augmentation.  Calf Veins: No  evidence of thrombus. Normal compressibility and flow on color Doppler imaging.  Superficial Great Saphenous Vein: No evidence of thrombus. Normal compressibility.  Venous Reflux:  None.  Other Findings: No evidence of superficial thrombophlebitis or abnormal fluid collection.  LEFT LOWER EXTREMITY  Common Femoral Vein: No evidence of thrombus. Normal compressibility, respiratory phasicity and response to augmentation.  Saphenofemoral Junction: No evidence of thrombus. Normal compressibility and flow on color Doppler imaging.  Profunda Femoral Vein: No evidence of thrombus. Normal compressibility and flow on color Doppler imaging.  Femoral Vein: No evidence of thrombus. Normal compressibility, respiratory phasicity and response to augmentation.  Popliteal Vein: No evidence of thrombus. Normal compressibility, respiratory phasicity and response to augmentation.  Calf Veins: No evidence of thrombus. Normal compressibility and flow on color Doppler imaging.  Superficial Great Saphenous Vein: No evidence of thrombus. Normal compressibility.  Venous Reflux:  None.  Other Findings: No evidence of superficial thrombophlebitis or abnormal fluid collection.  IMPRESSION: No evidence of deep venous thrombosis in either lower extremity.   Electronically Signed By: Aletta Edouard M.D. On: 08/05/2019 17:30  No results found for this or any previous visit.  No results found for this or any previous visit.  No results found for this or any previous visit.  No valid procedures specified. No results found for this or any previous visit.  Results for orders placed during the hospital encounter of 11/04/22  CT Renal Stone Study  Narrative CLINICAL DATA:  c/o lower abdominal pain, N/V/D. States has not been able to void like normal today and also noticed small amount of blood in his urine. Denies any other urinary symptoms Abdominal/flank pain, stone suspected  EXAM: CT  ABDOMEN AND PELVIS WITHOUT CONTRAST  TECHNIQUE: Multidetector CT imaging of the abdomen and pelvis was performed following the standard protocol without IV contrast.  RADIATION DOSE REDUCTION: This exam was performed according to the departmental dose-optimization program which includes automated exposure control, adjustment of the mA and/or kV according to patient size and/or use of iterative reconstruction technique.  COMPARISON:  CT abdomen pelvis 10/27/2022, CT abdomen pelvis 12/01/2020  FINDINGS: Lower chest: No acute abnormality.  Hepatobiliary:  Similar-appearing scattered hepatic hypodensities. The lesions greater than 1 cm measure simple fluid. Subcentimeter hypodensities are too small to characterize. No gallstones, gallbladder wall thickening, or pericholecystic fluid. No biliary dilatation.  Pancreas: No focal lesion. Normal pancreatic contour. No surrounding inflammatory changes. No main pancreatic ductal dilatation.  Spleen: Normal in size without focal abnormality.  Adrenals/Urinary Tract:  No adrenal nodule bilaterally.  4 mm calcified stone within the distal left  ureter with associated mild proximal hydronephrosis. Punctate left nephrolithiasis. Trace periureteral fat stranding. No right hydronephrosis. No right nephroureterolithiasis.  The urinary bladder is unremarkable.  Stomach/Bowel: Stomach is within normal limits. No evidence of bowel wall thickening or dilatation. Colonic diverticulosis. Appendix appears normal.  Vascular/Lymphatic: No abdominal aorta or iliac aneurysm. Mild atherosclerotic plaque of the aorta and its branches. No abdominal, pelvic, or inguinal lymphadenopathy.  Reproductive: Prostate is unremarkable.  Other: No intraperitoneal free fluid. No intraperitoneal free gas. No organized fluid collection.  Musculoskeletal:  No abdominal wall hernia or abnormality.  No suspicious lytic or blastic osseous lesions. No acute  displaced fracture. Multilevel degenerative changes of the spine.  IMPRESSION: 1. Obstructive 4 mm distal left ureterolithiasis. Correlate with urinalysis to exclude superimposed infection. 2. Nonobstructive punctate left nephrolithiasis. 3. Colonic diverticulosis with no acute diverticulitis.   Electronically Signed By: Iven Finn M.D. On: 11/04/2022 01:32   Assessment & Plan:    1. Kidney stones -We discussed the management of kidney stones. These options include observation, ureteroscopy, shockwave lithotripsy (ES[facility]) and percutaneous nephrolithotomy (PCNL). We discussed which options are relevant to the patient's stone(s). We discussed the natural history of kidney stones as well as the complications of untreated stones and the impact on quality of life without treatment as well as with each of the above listed treatments. We also discussed the efficacy of each treatment in its ability to clear the stone burden. With any of these management options I discussed the signs and symptoms of infection and the need for emergent treatment should these be experienced. For each option we discussed the ability of each procedure to clear the patient of their stone burden.   For observation I described the risks which include but are not limited to silent renal damage, life-threatening infection, need for emergent surgery, failure to pass stone and pain.   For ureteroscopy I described the risks which include bleeding, infection, damage to contiguous structures, positioning injury, ureteral stricture, ureteral avulsion, ureteral injury, need for prolonged ureteral stent, inability to perform ureteroscopy, need for an interval procedure, inability to clear stone burden, stent discomfort/pain, heart attack, stroke, pulmonary embolus and the inherent risks with general anesthesia.   For shockwave lithotripsy I described the risks which include arrhythmia, kidney contusion, kidney hemorrhage, need for  transfusion, pain, inability to adequately break up stone, inability to pass stone fragments, Steinstrasse, infection associated with obstructing stones, need for alternate surgical procedure, need for repeat shockwave lithotripsy, MI, CVA, PE and the inherent risks with anesthesia/conscious sedation.   For PCNL I described the risks including positioning injury, pneumothorax, hydrothorax, need for chest tube, inability to clear stone burden, renal laceration, arterial venous fistula or malformation, need for embolization of kidney, loss of kidney or renal function, need for repeat procedure, need for prolonged nephrostomy tube, ureteral avulsion, MI, CVA, PE and the inherent risks of general anesthesia.   - The patient would like to proceed with medical expulsive therapy.   No follow-ups on file.  Nicolette Bang, MD  [facility] [place]

## 2022-11-04 NOTE — Telephone Encounter (Signed)
Dena, can we see if this needs a PA?

## 2022-11-04 NOTE — Telephone Encounter (Signed)
We do not have any samples here either

## 2022-11-04 NOTE — Telephone Encounter (Signed)
Can we see about samples? Needs Xifaxan 550 mg TID for 14 days (#42).  I am copying [place] as well in case they have samples.

## 2022-11-04 NOTE — ED Triage Notes (Signed)
Pt c/o lower abdominal pain, N/V/D. States has not been able to void like normal today and also noticed small amount of blood in his urine. Denies any other urinary symptoms

## 2022-11-04 NOTE — Patient Instructions (Signed)

## 2022-11-04 NOTE — Telephone Encounter (Signed)
See other note

## 2022-11-04 NOTE — ED Provider Notes (Signed)
[facility] Provider Note   CSN: [id number] Arrival date & time: 11/04/22  0003     History  Chief Complaint  Patient presents with   Abdominal Pain    Jacob Hunter is a 67 y.o. male.  The history is provided by the patient.  Abdominal Pain He complains of sudden onset of suprapubic pain this evening.  There has been associated dry heaves.  There is no radiation of the pain and he specifically denies radiation to the back.  He has not tried anything to help the pain.  He has noted some blood in his urine.  He saw his primary care provider about a week ago who had ordered a CT scan because of blood in his urine.   Home Medications Prior to Admission medications   Medication Sig Start Date End Date Taking? Authorizing Provider  ascorbic acid (VITAMIN C) 1000 MG tablet Take 1,000 mg by mouth daily.    [provider]  calcium-vitamin D (OSCAL WITH D) 500-200 MG-UNIT tablet Take 1 tablet by mouth daily with breakfast. 07/30/19   Denton Brick, Courage, MD  Coenzyme Q10 (CO Q 10) 100 MG CAPS Take 100 mg by mouth daily.    [provider]  FLUoxetine (PROZAC) 20 MG capsule Take 20 mg by mouth daily.    [provider]  folic acid (FOLVITE) 1 MG tablet Take 1 tablet (1 mg total) by mouth daily. 07/29/19   Roxan Hockey, MD  methotrexate (RHEUMATREX) 2.5 MG tablet Take 25 mg by mouth every Monday.     [provider]  Multiple Vitamin (MULTIVITAMIN) tablet Take 1 tablet by mouth daily.    [provider]  predniSONE (DELTASONE) 10 MG tablet Take 12.5 mg by mouth daily with breakfast.    [provider]  PRESCRIPTION MEDICATION IVIG Antibodies 2 visits every 4 weeks    [provider]  rifaximin (XIFAXAN) 550 MG TABS tablet Take 1 tablet (550 mg total) by mouth 3 (three) times daily for 14 days. 10/29/22 11/12/22  Annitta Needs, NP  riTUXimab (RITUXAN) 100 MG/10ML injection Inject 1,000  mg into the vein See admin instructions. 2 times a month in March and September    [provider]  vitamin B-12 (CYANOCOBALAMIN) 1000 MCG tablet Take 1,000 mcg by mouth daily.    [provider]  vitamin k 100 MCG tablet Take 150 mcg by mouth daily.    [provider]      Allergies    Methylprednisolone, Demerol [meperidine], and Statins    Review of Systems   Review of Systems  Gastrointestinal:  Positive for abdominal pain.  All other systems reviewed and are negative.   Physical Exam Updated Vital Signs BP (!) 153/90   Pulse 80   Temp 97.8 F (36.6 C) (Oral)   Resp (!) 23   Ht 6\' 1"  (1.854 m)   Wt 107.2 kg   SpO2 99%   BMI 31.18 kg/m  Physical Exam Vitals and nursing note reviewed.   67 year old male, appears pale and uncomfortable, but is in no acute distress. Vital signs are significant for elevated blood pressure. Oxygen saturation is 99%, which is normal.  He is intermittently having dry heaves. Head is normocephalic and atraumatic. PERRLA, EOMI. Oropharynx is clear. Neck is nontender and supple without adenopathy or JVD. Back is nontender in the midline.  There is moderate left CVA tenderness. Lungs are clear without rales, wheezes, or rhonchi.  Chest is nontender. Heart has regular rate and rhythm without murmur. Abdomen is soft, flat, with moderate tenderness of the left mid and lower abdomen.  There is no rebound or guarding. Extremities have no cyanosis or edema, full range of motion is present. Skin is warm and dry without rash. Neurologic: Mental status is normal, cranial nerves are intact, moves all extremities equally.  ED Results / Procedures / Treatments   Labs (all labs ordered are listed, but only abnormal results are displayed) Labs Reviewed  LIPASE, BLOOD - Abnormal; Notable for the following components:      Result Value   Lipase 59 (*)    All other components within normal limits  CBC - Abnormal; Notable for the  following components:   WBC 13.4 (*)    RDW 17.2 (*)    All other components within normal limits  URINALYSIS, ROUTINE W REFLEX MICROSCOPIC - Abnormal; Notable for the following components:   Color, Urine AMBER (*)    APPearance CLOUDY (*)    Hgb urine dipstick LARGE (*)    Protein, ur 30 (*)    Bacteria, UA RARE (*)    All other components within normal limits  URINE CULTURE  COMPREHENSIVE METABOLIC PANEL  CBG MONITORING, ED   Radiology CT Renal Stone Study  Result Date: 11/04/2022 CLINICAL DATA:  c/o lower abdominal pain, N/V/D. States has not been able to void like normal today and also noticed small amount of blood in his urine. Denies any other urinary symptoms Abdominal/flank pain, stone suspected EXAM: CT ABDOMEN AND PELVIS WITHOUT CONTRAST TECHNIQUE: Multidetector CT imaging of the abdomen and pelvis was performed following the standard protocol without IV contrast. RADIATION DOSE REDUCTION: This exam was performed according to the departmental dose-optimization program which includes automated exposure control, adjustment of the mA and/or kV according to patient size and/or use of iterative reconstruction technique. COMPARISON:  CT abdomen pelvis 10/27/2022, CT abdomen pelvis 12/01/2020 FINDINGS: Lower chest: No acute abnormality. Hepatobiliary: Similar-appearing scattered hepatic hypodensities. The lesions greater than 1 cm measure simple fluid. Subcentimeter hypodensities are too small to characterize. No gallstones, gallbladder wall thickening, or pericholecystic fluid. No biliary dilatation. Pancreas: No focal lesion. Normal pancreatic contour. No surrounding inflammatory changes. No main pancreatic ductal dilatation. Spleen: Normal in size without focal abnormality. Adrenals/Urinary Tract: No adrenal nodule bilaterally. 4 mm calcified stone within the distal left ureter with associated mild proximal hydronephrosis. Punctate left nephrolithiasis. Trace periureteral fat stranding. No  right hydronephrosis. No right nephroureterolithiasis. The urinary bladder is unremarkable. Stomach/Bowel: Stomach is within normal limits. No evidence of bowel wall thickening or dilatation. Colonic diverticulosis. Appendix appears normal. Vascular/Lymphatic: No abdominal aorta or iliac aneurysm. Mild atherosclerotic plaque of the aorta and its branches. No abdominal, pelvic, or inguinal lymphadenopathy. Reproductive: Prostate is unremarkable. Other: No intraperitoneal free fluid. No intraperitoneal free gas. No organized fluid collection. Musculoskeletal: No abdominal wall hernia or abnormality. No suspicious lytic or blastic osseous lesions. No acute displaced fracture. Multilevel degenerative changes of the spine. IMPRESSION: 1. Obstructive 4 mm distal left ureterolithiasis. Correlate with urinalysis to exclude superimposed infection. 2. Nonobstructive punctate left nephrolithiasis. 3. Colonic diverticulosis with no acute diverticulitis. Electronically Signed   By: Iven Finn M.D.   On: 11/04/2022 01:32    Procedures Procedures  Cardiac monitor shows normal sinus rhythm, per my interpretation.  Medications Ordered in ED Medications  cephALEXin (KEFLEX) capsule 500 mg (has no administration in time range)  sodium chloride 0.9 % bolus 1,000 mL (0 mLs Intravenous Stopped  11/04/22 0206)  ondansetron (ZOFRAN) injection 4 mg (4 mg Intravenous Given 11/04/22 0053)  morphine (PF) 4 MG/ML injection 4 mg (4 mg Intravenous Given 11/04/22 0054)  ketorolac (TORADOL) 30 MG/ML injection 30 mg (30 mg Intravenous Given 11/04/22 0053)    ED Course/ Medical Decision Making/ A&P                             Medical Decision Making Amount and/or Complexity of Data Reviewed Labs: ordered. Radiology: ordered.  Risk Prescription drug management.   Lower abdominal pain with nausea and vomiting and hematuria.  Differential diagnosis is broad, and includes, but is not limited to, urinary tract infection,  bladder calculi, renal colic, diverticulitis, abdominal aortic aneurysm, bowel obstruction.  Patient does state that he has had kidney stones in the past, but cannot tell me if this feels anything similar to that.  He does relate that he was not told of anything found on his recent CT scan.  I have reviewed his past records, and CT scan of abdomen and pelvis with and without contrast on 10/27/2022 had reports of no renal or ureteral or bladder calculi.  However, I have reviewed the images, and I see a left ureteral calculus in the left mid ureter.  Given his history of hematuria and left-sided pain, I strongly suspect that this calculus has moved and is now causing obstruction at the ureterovesical junction.  I have ordered a renal stone protocol CT scan to evaluate this.  I have ordered IV fluids, morphine for pain, ondansetron for nausea, and a dose of ketorolac for pain.  I have ordered laboratory workup of CBC, comprehensive metabolic panel, lipase, urinalysis.  He had excellent relief of symptoms with above-noted treatment, color has returned to normal.  CT scan shows obstructing calculus in the distal left ureter.  This does appear to be the calculus and noted on the prior CT scan.  I have independently viewed the images, and agree with radiologist interpretation.  I have reviewed and interpreted his laboratory test, and my interpretation is mildly elevated lipase which is not felt to be clinically significant, normal comprehensive metabolic panel, mild leukocytosis which is felt to be stress related.  Urinalysis has greater than 50 RBCs consistent with kidney stone, but also 21-50 WBCs with WBC clumps concerning for possible UTI.  Rare bacteria present.  I have ordered a dose of cephalexin.  I am discharging him with prescriptions for cephalexin, tamsulosin, hydrocodone-acetaminophen, ondansetron oral dissolving tablet.  He is already scheduled to see a urologist regarding his hematuria, he is to keep that  appointment.  I have advised him to return for inadequate pain control or if he develops a fever.  Final Clinical Impression(s) / ED Diagnoses Final diagnoses:  Renal colic on left side  Ureterolithiasis  Urinary tract infection with hematuria, site unspecified    Rx / DC Orders ED Discharge Orders          Ordered    HYDROcodone-acetaminophen (NORCO) 5-325 MG tablet  Every 4 hours PRN        11/04/22 0333    HYDROcodone-acetaminophen (NORCO) 5-325 MG tablet  Every 4 hours PRN        11/04/22 0333    ondansetron (ZOFRAN-ODT) 8 MG disintegrating tablet  Every 8 hours PRN        11/04/22 0333    tamsulosin (FLOMAX) 0.4 MG CAPS capsule  Daily  11/04/22 [id number]              Delora Fuel, MD [id number] [phone]

## 2022-11-05 LAB — URINE CULTURE: Culture: NO GROWTH

## 2022-11-05 MED FILL — Hydrocodone-Acetaminophen Tab 5-325 MG: ORAL | Qty: 6 | Status: AC

## 2022-11-06 ENCOUNTER — Telehealth: Payer: Self-pay | Admitting: *Deleted

## 2022-11-06 DIAGNOSIS — Z79620 Long term (current) use of immunosuppressive biologic: Secondary | ICD-10-CM | POA: Diagnosis not present

## 2022-11-06 DIAGNOSIS — G7289 Other specified myopathies: Secondary | ICD-10-CM | POA: Diagnosis not present

## 2022-11-06 NOTE — Telephone Encounter (Signed)
.         Patient  visited [facility] on 11/04/2022  for treatment    Telephone encounter attempt :  1st  A HIPAA compliant voice message was left requesting a return call.  Instructed patient to call back at [phone]. [place] [phone] [address] , [place] [address] Email : [email or web address] [email or web address]. [email or web address]

## 2022-11-07 ENCOUNTER — Telehealth: Payer: Self-pay | Admitting: *Deleted

## 2022-11-07 NOTE — Telephone Encounter (Signed)
        Patient  visited [facility] on 11/04/2022  for treatment    Telephone encounter attempt :  2nd  A HIPAA compliant voice message was left requesting a return call.  Instructed patient to call back at [phone]. [facility] [phone] [address] , [place] [address] Email : Ashby [email or web address]. [email or web address]

## 2022-11-11 ENCOUNTER — Encounter (HOSPITAL_COMMUNITY): Payer: Self-pay | Admitting: Internal Medicine

## 2022-11-11 ENCOUNTER — Encounter (HOSPITAL_COMMUNITY)
Admission: RE | Disposition: A | Discharge status: Home / Self Care | Payer: Self-pay | Source: Home / Self Care | Attending: Internal Medicine

## 2022-11-11 ENCOUNTER — Other Ambulatory Visit: Payer: Self-pay

## 2022-11-11 ENCOUNTER — Ambulatory Visit (HOSPITAL_BASED_OUTPATIENT_CLINIC_OR_DEPARTMENT_OTHER): Payer: PPO | Admitting: Anesthesiology

## 2022-11-11 ENCOUNTER — Ambulatory Visit (HOSPITAL_COMMUNITY)
Admission: RE | Admit: 2022-11-11 | Discharge: 2022-11-11 | Disposition: A | Discharge status: Home / Self Care | Payer: PPO | Attending: Internal Medicine | Admitting: Internal Medicine

## 2022-11-11 ENCOUNTER — Ambulatory Visit (HOSPITAL_COMMUNITY): Payer: PPO | Admitting: Anesthesiology

## 2022-11-11 DIAGNOSIS — Z86010 Personal history of colonic polyps: Secondary | ICD-10-CM

## 2022-11-11 DIAGNOSIS — D124 Benign neoplasm of descending colon: Secondary | ICD-10-CM | POA: Insufficient documentation

## 2022-11-11 DIAGNOSIS — K5730 Diverticulosis of large intestine without perforation or abscess without bleeding: Secondary | ICD-10-CM

## 2022-11-11 DIAGNOSIS — K529 Noninfective gastroenteritis and colitis, unspecified: Secondary | ICD-10-CM | POA: Insufficient documentation

## 2022-11-11 DIAGNOSIS — D649 Anemia, unspecified: Secondary | ICD-10-CM

## 2022-11-11 DIAGNOSIS — Z09 Encounter for follow-up examination after completed treatment for conditions other than malignant neoplasm: Secondary | ICD-10-CM | POA: Diagnosis present

## 2022-11-11 DIAGNOSIS — D120 Benign neoplasm of cecum: Secondary | ICD-10-CM | POA: Insufficient documentation

## 2022-11-11 DIAGNOSIS — D123 Benign neoplasm of transverse colon: Secondary | ICD-10-CM | POA: Insufficient documentation

## 2022-11-11 DIAGNOSIS — Z1211 Encounter for screening for malignant neoplasm of colon: Secondary | ICD-10-CM | POA: Diagnosis not present

## 2022-11-11 DIAGNOSIS — D125 Benign neoplasm of sigmoid colon: Secondary | ICD-10-CM | POA: Diagnosis not present

## 2022-11-11 DIAGNOSIS — F32A Depression, unspecified: Secondary | ICD-10-CM | POA: Insufficient documentation

## 2022-11-11 DIAGNOSIS — F419 Anxiety disorder, unspecified: Secondary | ICD-10-CM | POA: Insufficient documentation

## 2022-11-11 DIAGNOSIS — K635 Polyp of colon: Secondary | ICD-10-CM | POA: Diagnosis not present

## 2022-11-11 HISTORY — PX: COLONOSCOPY WITH PROPOFOL: SHX5780

## 2022-11-11 HISTORY — PX: BIOPSY: SHX5522

## 2022-11-11 HISTORY — PX: POLYPECTOMY: SHX5525

## 2022-11-11 LAB — C DIFFICILE QUICK SCREEN W PCR REFLEX
C Diff antigen: NEGATIVE
C Diff interpretation: NOT DETECTED
C Diff toxin: NEGATIVE

## 2022-11-11 SURGERY — COLONOSCOPY WITH PROPOFOL
Anesthesia: General

## 2022-11-11 MED ORDER — STERILE WATER FOR IRRIGATION IR SOLN
Status: DC | PRN
  Administered 2022-11-11: 180 mL

## 2022-11-11 MED ORDER — LIDOCAINE HCL (CARDIAC) PF 100 MG/5ML IV SOSY
PREFILLED_SYRINGE | INTRAVENOUS | Status: DC | PRN
  Administered 2022-11-11: 50 mg via INTRATRACHEAL

## 2022-11-11 MED ORDER — PROPOFOL 10 MG/ML IV BOLUS
INTRAVENOUS | Status: DC | PRN
  Administered 2022-11-11: 100 mg via INTRAVENOUS

## 2022-11-11 MED ORDER — LACTATED RINGERS IV SOLN: INTRAVENOUS | Status: DC

## 2022-11-11 MED ORDER — PROPOFOL 500 MG/50ML IV EMUL
INTRAVENOUS | Status: DC | PRN
  Administered 2022-11-11: 180 ug/kg/min via INTRAVENOUS

## 2022-11-11 NOTE — Discharge Instructions (Signed)
  Colonoscopy Discharge Instructions  Read the instructions outlined below and refer to this sheet in the next few weeks. These discharge instructions provide you with general information on caring for yourself after you leave the hospital. Your doctor may also give you specific instructions. While your treatment has been planned according to the most current medical practices available, unavoidable complications occasionally occur. If you have any problems or questions after discharge, call Dr. Gala Romney at [phone]. ACTIVITY You may resume your regular activity, but move at a slower pace for the next 24 hours.  Take frequent rest periods for the next 24 hours.  Walking will help get rid of the air and reduce the bloated feeling in your belly (abdomen).  No driving for 24 hours (because of the medicine (anesthesia) used during the test).   Do not sign any important legal documents or operate any machinery for 24 hours (because of the anesthesia used during the test).  NUTRITION Drink plenty of fluids.  You may resume your normal diet as instructed by your doctor.  Begin with a light meal and progress to your normal diet. Heavy or fried foods are harder to digest and may make you feel sick to your stomach (nauseated).  Avoid alcoholic beverages for 24 hours or as instructed.  MEDICATIONS You may resume your normal medications unless your doctor tells you otherwise.  WHAT YOU CAN EXPECT TODAY Some feelings of bloating in the abdomen.  Passage of more gas than usual.  Spotting of blood in your stool or on the toilet paper.  IF YOU HAD POLYPS REMOVED DURING THE COLONOSCOPY: No aspirin products for 7 days or as instructed.  No alcohol for 7 days or as instructed.  Eat a soft diet for the next 24 hours.  FINDING OUT THE RESULTS OF YOUR TEST Not all test results are available during your visit. If your test results are not back during the visit, make an appointment with your caregiver to find out the  results. Do not assume everything is normal if you have not heard from your caregiver or the medical facility. It is important for you to follow up on all of your test results.  SEEK IMMEDIATE MEDICAL ATTENTION IF: You have more than a spotting of blood in your stool.  Your belly is swollen (abdominal distention).  You are nauseated or vomiting.  You have a temperature over 101.  You have abdominal pain or discomfort that is severe or gets worse throughout the day.    Multiple polyps removed today.  Samples taken to further evaluate the cause of your diarrhea.      further recommendations to follow pending review of sample reports       office visit with [place] in 6 weeks       at patient request, called Kela Millin at [phone] -  reviewed findings and recommendations

## 2022-11-11 NOTE — Anesthesia Preprocedure Evaluation (Signed)
Anesthesia Evaluation  Patient identified by MRN, date of birth, ID band Patient awake    Reviewed: Allergy & Precautions, H&P , NPO status , Patient's Chart, lab work & pertinent test results, reviewed documented beta blocker date and time   Airway Mallampati: II  TM Distance: >3 FB Neck ROM: full    Dental no notable dental hx.    Pulmonary neg pulmonary ROS, pneumonia   Pulmonary exam normal breath sounds clear to auscultation       Cardiovascular Exercise Tolerance: Good negative cardio ROS  Rhythm:regular Rate:Normal     Neuro/Psych  PSYCHIATRIC DISORDERS Anxiety Depression     Neuromuscular disease negative neurological ROS  negative psych ROS   GI/Hepatic negative GI ROS, Neg liver ROS,,,  Endo/Other  negative endocrine ROS    Renal/GU negative Renal ROS  negative genitourinary   Musculoskeletal   Abdominal   Peds  Hematology negative hematology ROS (+) Blood dyscrasia, anemia   Anesthesia Other Findings   Reproductive/Obstetrics negative OB ROS                             Anesthesia Physical Anesthesia Plan  ASA: 2  Anesthesia Plan: General   Post-op Pain Management:    Induction:   PONV Risk Score and Plan: Propofol infusion  Airway Management Planned:   Additional Equipment:   Intra-op Plan:   Post-operative Plan:   Informed Consent: I have reviewed the patients History and Physical, chart, labs and discussed the procedure including the risks, benefits and alternatives for the proposed anesthesia with the patient or authorized representative who has indicated his/her understanding and acceptance.     Dental Advisory Given  Plan Discussed with: CRNA  Anesthesia Plan Comments:        Anesthesia Quick Evaluation

## 2022-11-11 NOTE — Interval H&P Note (Signed)
History and Physical Interval Note:  11/11/2022 8:22 AM  Jacob Hunter  has presented today for surgery, with the diagnosis of history of polyps,anemia,chronic diarrhea.  The various methods of treatment have been discussed with the patient and family. After consideration of risks, benefits and other options for treatment, the patient has consented to  Procedure(s) with comments: COLONOSCOPY WITH PROPOFOL (N/A) - 8:15 am ileocolonoscopy ESOPHAGOGASTRODUODENOSCOPY (EGD) WITH PROPOFOL (N/A) as a surgical intervention.  The patient's history has been reviewed, patient examined, no change in status, stable for surgery.  I have reviewed the patient's chart and labs.  Questions were answered to the patient's satisfaction.     Dora Simeone  No change.  He does not have iron deficiency.  He does not have any upper GI tract symptoms (GI review of symptoms negative for upper GI tract symptoms) Nearly 3-year history of nearly incessant diarrhea started acutely 3 years ago.  Celiac markers negative.  Ileocolonoscopy today per plan.  No EGD per plan. .rmr

## 2022-11-11 NOTE — Op Note (Signed)
[facility] Patient Name: Jacob Hunter Procedure Date: 11/11/2022 8:15 AM MRN: [id number] Date of Birth: August 04, 1956 Attending MD: Norvel Richards , MD, [id number] CSN: [id number] Age: 67 Admit Type: Outpatient Procedure:                Colonoscopy Indications:              High risk colon cancer surveillance: Personal                            history of colonic polyps Providers:                Norvel Richards, MD, Lurline Del, RN, Dereck Leep, Technician Referring MD:              Medicines:                Propofol per Anesthesia Complications:            No immediate complications. Estimated Blood Loss:     Estimated blood loss was minimal. Procedure:                Pre-Anesthesia Assessment:                           - Prior to the procedure, a History and Physical                            was performed, and patient medications and                            allergies were reviewed. The patient's tolerance of                            previous anesthesia was also reviewed. The risks                            and benefits of the procedure and the sedation                            options and risks were discussed with the patient.                            All questions were answered, and informed consent                            was obtained. Prior Anticoagulants: The patient has                            taken no anticoagulant or antiplatelet agents. ASA                            Grade Assessment: III - A patient with severe  systemic disease. After reviewing the risks and                            benefits, the patient was deemed in satisfactory                            condition to undergo the procedure.                           After obtaining informed consent, the colonoscope                            was passed under direct vision. Throughout the                             procedure, the patient's blood pressure, pulse, and                            oxygen saturations were monitored continuously. The                            [id number] scope was introduced through the                            anus and advanced to the 6 cm into the ileum. The                            colonoscopy was performed without difficulty. The                            patient tolerated the procedure well. The quality                            of the bowel preparation was adequate. The terminal                            ileum, ileocecal valve, appendiceal orifice, and                            rectum were photographed. The colonoscopy was                            performed without difficulty. The patient tolerated                            the procedure well. The entire colon was well                            visualized. Scope In: 8:31:15 AM Scope Out: 9:19:28 AM Scope Withdrawal Time: 0 hours 36 minutes 39 seconds  Total Procedure Duration: 0 hours 48 minutes 13 seconds  Findings:      The perianal and digital rectal examinations were normal.      Scattered small-mouthed diverticula were found in the sigmoid colon.  There was no evidence of diverticular bleeding. Distal 6 cm of TI       appeared normal.      8 sessile polyps were found in the descending colon, transverse colon       and cecum. The polyps were 4 to 6 mm in size. These polyps were removed       with a cold snare. Resection and retrieval were complete. Estimated       blood loss was minimal.      Two sessile polyps were found in the transverse colon. The polyps were 7       to 8 mm in size. These polyps were removed with a hot snare. Resection       and retrieval were complete. Estimated blood loss: none.      Sigmoid biopsies of the ascending and descending colon were taken for       histologic study. Stool was submitted for GI P, C. difficile Impression:               - Diverticulosis in  the sigmoid colon. There was no                            evidence of diverticular bleeding.                           - Nine 4 to 6 mm polyps in the descending colon, in                            the transverse colon and in the cecum, removed with                            a cold snare. Resected and retrieved.                           - Two 7 to 8 mm polyps in the transverse colon,                            removed with a hot snare. Resected and retrieved.                            Status post segmental biopsy. Normal-appearing TI.                            Stool sample submitted to microbiology Moderate Sedation:      Moderate (conscious) sedation was personally administered by an       anesthesia professional. The following parameters were monitored: oxygen       saturation, heart rate, blood pressure, respiratory rate, EKG, adequacy       of pulmonary ventilation, and response to care. Recommendation:           - Patient has a contact number available for                            emergencies. The signs and symptoms of potential  delayed complications were discussed with the                            patient. Return to normal activities tomorrow.                            Written discharge instructions were provided to the                            patient.                           - Advance diet as tolerated. Follow-up on                            pathology. Stool studies.                           - Continue present medications.                           - Repeat colonoscopy after studies are complete for                            surveillance.                           - Return to GI office in 6 weeks. Procedure Code(s):        --- Professional ---                           [id number], Colonoscopy, flexible; with removal of                            tumor(s), polyp(s), or other lesion(s) by snare                            technique Diagnosis  Code(s):        --- Professional ---                           Z86.010, Personal history of colonic polyps                           D12.4, Benign neoplasm of descending colon                           D12.0, Benign neoplasm of cecum                           D12.3, Benign neoplasm of transverse colon (hepatic                            flexure or splenic flexure)                           K57.30, Diverticulosis of large intestine without  perforation or abscess without bleeding CPT copyright 2022 American [employer]. All rights reserved. The codes documented in this report are preliminary and upon coder review may  be revised to meet current compliance requirements. Cristopher Estimable. Caymen Dubray, MD Norvel Richards, MD 11/11/2022 9:29:39 AM This report has been signed electronically. Number of Addenda: 0

## 2022-11-11 NOTE — Transfer of Care (Signed)
Immediate Anesthesia Transfer of Care Note  Patient: Jacob Hunter  Procedure(s) Performed: COLONOSCOPY WITH PROPOFOL POLYPECTOMY BIOPSY  Patient Location: Endoscopy Unit  Anesthesia Type:General  Level of Consciousness: awake, alert , oriented, and patient cooperative  Airway & Oxygen Therapy: Patient Spontanous Breathing  Post-op Assessment: Report given to RN, Post -op Vital signs reviewed and stable, and Patient moving all extremities  Post vital signs: Reviewed and stable  Last Vitals:  Vitals Value Taken Time  BP 126/59 11/11/22 0924  Temp 36.4 C 11/11/22 0924  Pulse 60 11/11/22 0924  Resp 16 11/11/22 0924  SpO2 98 % 11/11/22 0924    Last Pain:  Vitals:   11/11/22 0924  TempSrc: Oral  PainSc: 0-No pain      Patients Stated Pain Goal: 4 ([id number])  Complications: No notable events documented.

## 2022-11-12 DIAGNOSIS — G7289 Other specified myopathies: Secondary | ICD-10-CM | POA: Diagnosis not present

## 2022-11-12 LAB — GASTROINTESTINAL PANEL BY PCR, STOOL (REPLACES STOOL CULTURE)

## 2022-11-12 LAB — SURGICAL PATHOLOGY

## 2022-11-13 ENCOUNTER — Encounter: Payer: Self-pay | Admitting: Internal Medicine

## 2022-11-14 NOTE — Anesthesia Postprocedure Evaluation (Signed)
Anesthesia Post Note  Patient: Jacob Hunter  Procedure(s) Performed: COLONOSCOPY WITH PROPOFOL POLYPECTOMY BIOPSY  Patient location during evaluation: Phase II Anesthesia Type: General Level of consciousness: awake Pain management: pain level controlled Vital Signs Assessment: post-procedure vital signs reviewed and stable Respiratory status: spontaneous breathing and respiratory function stable Cardiovascular status: blood pressure returned to baseline and stable Postop Assessment: no headache and no apparent nausea or vomiting Anesthetic complications: no Comments: Late entry   No notable events documented.   Last Vitals:  Vitals:   11/11/22 0716 11/11/22 0924  BP: (!) 161/94 (!) 126/59  Pulse: 74 60  Resp:  16  Temp: 36.5 C 36.4 C  SpO2: 97% 98%    Last Pain:  Vitals:   11/11/22 0924  TempSrc: Oral  PainSc: 0-No pain                 Louann Sjogren

## 2022-11-18 ENCOUNTER — Encounter (HOSPITAL_COMMUNITY): Payer: Self-pay | Admitting: Internal Medicine

## 2022-11-20 ENCOUNTER — Telehealth: Payer: Self-pay

## 2022-11-20 NOTE — Telephone Encounter (Signed)
Pt phoned to advise that he is having way more diarrhea being on the Xifaxan than before starting it. Pt states every time he eats, it goes straight through him. Please advise   Also the pt wants to know if he could do his next colonoscopy in 2 years instead of 3 as advised by Dr Jena Gauss. Please advise

## 2022-11-21 ENCOUNTER — Other Ambulatory Visit: Payer: Self-pay

## 2022-11-21 DIAGNOSIS — N200 Calculus of kidney: Secondary | ICD-10-CM | POA: Diagnosis not present

## 2022-11-21 NOTE — Telephone Encounter (Signed)
We do not have but 2 boxes of Creon. No Zenpep. Please advise

## 2022-11-21 NOTE — Telephone Encounter (Signed)
I recommend three year surveillance unless something clinically changes. We can discuss the rationale at next office visit regarding the timing intervals.   Stop Xifaxan as it is worsening symptoms. We can provide samples of either Zenpep or Creon. Just let me know which one is available, and I will give instructions.

## 2022-11-22 DIAGNOSIS — H43811 Vitreous degeneration, right eye: Secondary | ICD-10-CM | POA: Diagnosis not present

## 2022-11-22 DIAGNOSIS — H26492 Other secondary cataract, left eye: Secondary | ICD-10-CM | POA: Diagnosis not present

## 2022-11-22 DIAGNOSIS — H4311 Vitreous hemorrhage, right eye: Secondary | ICD-10-CM | POA: Diagnosis not present

## 2022-11-25 NOTE — Telephone Encounter (Signed)
Phoned and LMOVM for the pt to return call 

## 2022-11-25 NOTE — Telephone Encounter (Signed)
We can try Creon 40,000 units. Take 2 capsules with meals (WHILE EATING), and 1 capsule with snacks while eating. It is best if taking during the meal/snack, not before or after.

## 2022-11-26 NOTE — Telephone Encounter (Signed)
Pt contacted by MyChart regarding samples of Creon being  left up front with the directions

## 2022-11-28 LAB — CALCULI, WITH PHOTOGRAPH (CLINICAL LAB)
Calcium Oxalate Dihydrate: 40 %
Calcium Oxalate Monohydrate: 60 %
Weight Calculi: 3 mg

## 2022-12-04 ENCOUNTER — Ambulatory Visit (HOSPITAL_COMMUNITY)
Admission: RE | Admit: 2022-12-04 | Discharge: 2022-12-04 | Disposition: A | Discharge status: Home / Self Care | Payer: PPO | Source: Ambulatory Visit | Attending: Urology | Admitting: Urology

## 2022-12-04 ENCOUNTER — Ambulatory Visit (INDEPENDENT_AMBULATORY_CARE_PROVIDER_SITE_OTHER): Payer: PPO | Admitting: Urology

## 2022-12-04 VITALS — BP 126/75 | HR 87

## 2022-12-04 DIAGNOSIS — N200 Calculus of kidney: Secondary | ICD-10-CM | POA: Insufficient documentation

## 2022-12-04 DIAGNOSIS — R109 Unspecified abdominal pain: Secondary | ICD-10-CM | POA: Diagnosis not present

## 2022-12-04 LAB — URINALYSIS, ROUTINE W REFLEX MICROSCOPIC
Bilirubin, UA: NEGATIVE
Glucose, UA: NEGATIVE
Ketones, UA: NEGATIVE
Leukocytes,UA: NEGATIVE
Nitrite, UA: NEGATIVE
Protein,UA: NEGATIVE
RBC, UA: NEGATIVE
Specific Gravity, UA: 1.025 (ref 1.005–1.030)
Urobilinogen, Ur: 0.2 mg/dL (ref 0.2–1.0)
pH, UA: 5 (ref 5.0–7.5)

## 2022-12-04 NOTE — Patient Instructions (Signed)

## 2022-12-04 NOTE — Progress Notes (Signed)
12/04/2022 9:41 AM   Penni Bombard 1955-12-16 [id number]  Referring provider: Carylon Perches, MD [address] [place],  [place] [address]  Followup nephrolithiasis   HPI: Mr Bollig is a 67yo here for followup for nephrolithiasis. He passed the 6mm calculus. He then passed a few small calculi this morning. He denies any flank pain. No significant LUTS   PMH: Past Medical History:  Diagnosis Date   Arthritis    Knee pain    Necrotizing myopathy    Pneumonia    Polymyositis     Surgical History: Past Surgical History:  Procedure Laterality Date   ANKLE SURGERY Left    BIOPSY  11/11/2022   Procedure: BIOPSY;  Surgeon: Corbin Ade, MD;  [facility];  Service: Endoscopy;;   COLONOSCOPY N/A 10/05/2019   Rourk: 3 colonic polyps removed, 2 were tubular adenomas, cecal polyp was sessile serrated polyp.  Advised 3-year surveillance colonoscopy.   COLONOSCOPY WITH PROPOFOL N/A 11/11/2022   Procedure: COLONOSCOPY WITH PROPOFOL;  Surgeon: Corbin Ade, MD;  [facility];  Service: Endoscopy;  Laterality: N/A;  8:15 am ileocolonoscopy   EYE SURGERY     bil cataracts   KNEE SURGERY Left    arthroscopy x 2   left wrist surgery     POLYPECTOMY  10/05/2019   Procedure: POLYPECTOMY;  Surgeon: Corbin Ade, MD;  [facility];  Service: Endoscopy;;   POLYPECTOMY  11/11/2022   Procedure: POLYPECTOMY;  Surgeon: Corbin Ade, MD;  [facility];  Service: Endoscopy;;   TOTAL KNEE ARTHROPLASTY Left 10/04/2020   Procedure: TOTAL KNEE ARTHROPLASTY;  Surgeon: Eugenia Mcalpine, MD;  Location: [facility];  Service: Orthopedics;  Laterality: Left;  adductor canal    Home Medications:  Allergies as of 12/04/2022       Reactions   Methylprednisolone Anaphylaxis, Shortness Of Breath, Rash   Demerol [meperidine] Hives   Statins Other (See Comments)   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 If you have any questions, ask your nurse or doctor.          ascorbic acid 1000 MG tablet Commonly known as: VITAMIN C Take 1,000 mg by mouth daily.   calcium-vitamin D 500-200 MG-UNIT tablet Commonly known as: OSCAL WITH D Take 1 tablet by mouth daily with breakfast.   cephALEXin 500 MG capsule Commonly known as: KEFLEX Take 1 capsule (500 mg total) by mouth 3 (three) times daily.   Co Q 10 100 MG Caps Take 100 mg by mouth daily.   cyanocobalamin 1000 MCG tablet Commonly known as: VITAMIN B12 Take 1,000 mcg by mouth daily.   FLUoxetine 20 MG capsule Commonly known as: PROZAC Take 20 mg by mouth daily.   folic acid 1 MG tablet Commonly known as: FOLVITE Take 1 tablet (1 mg total) by mouth daily.   HYDROcodone-acetaminophen 5-325 MG tablet Commonly known as: Norco Take 1 tablet by mouth every 4 (four) hours as needed for moderate pain.   HYDROcodone-acetaminophen 5-325 MG tablet Commonly known as: Norco Take 1 tablet by mouth every 4 (four) hours as needed for moderate pain.   methotrexate 2.5 MG tablet Commonly known as: RHEUMATREX Take 25 mg by mouth every Monday.   multivitamin tablet Take 1 tablet by mouth daily.   ondansetron 8 MG disintegrating  tablet Commonly known as: ZOFRAN-ODT Take 1 tablet (8 mg total) by mouth every 8 (eight) hours as needed for nausea or vomiting.   predniSONE 10 MG tablet Commonly known as: DELTASONE Take 12.5 mg by mouth daily with breakfast.   PRESCRIPTION MEDICATION IVIG Antibodies 2 visits every 4 weeks   riTUXimab 100 MG/10ML injection Commonly known as: RITUXAN Inject 1,000 mg into the vein See admin instructions. 2 times a month in March and September   tamsulosin 0.4 MG Caps capsule Commonly known as: FLOMAX Take 1 capsule (0.4 mg total) by mouth daily.   vitamin k 100 MCG tablet Take 150 mcg by mouth daily.        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 He has never used smokeless tobacco. He reports that he does not drink alcohol and does not use drugs.  ROS: All other review of systems were reviewed and are negative except what is noted above in HPI  Physical Exam: BP 126/75   Pulse 87   Constitutional:  Alert and oriented, No acute distress. HEENT: [place] AT, moist mucus membranes.  Trachea midline, no masses. Cardiovascular: No clubbing, cyanosis, or edema. Respiratory: Normal respiratory effort, no increased work of breathing. GI: Abdomen is soft, nontender, nondistended, no abdominal masses GU: No CVA tenderness.  Lymph: No cervical or inguinal lymphadenopathy. Skin: No rashes, bruises or suspicious lesions. Neurologic: Grossly intact, no focal deficits, moving all 4 extremities. Psychiatric: Normal mood and affect.  Laboratory Data: Lab Results  Component Value Date   WBC 13.4 (H) 11/04/2022   HGB 13.3 11/04/2022   HCT 40.6 11/04/2022   MCV 94.9 11/04/2022   PLT 192 11/04/2022    Lab Results  Component Value Date   CREATININE 1.16 11/04/2022    No results found for: "PSA"  No results found for: "TESTOSTERONE"  No results found for: "HGBA1C"  Urinalysis    Component Value Date/Time   COLORURINE AMBER (A) 11/04/2022 0016   APPEARANCEUR CLOUDY (A) 11/04/2022 0016   LABSPEC 1.016 11/04/2022 0016   PHURINE 5.0 11/04/2022 0016   GLUCOSEU NEGATIVE  11/04/2022 0016   HGBUR LARGE (A) 11/04/2022 0016   BILIRUBINUR NEGATIVE 11/04/2022 0016   KETONESUR NEGATIVE 11/04/2022 0016   PROTEINUR 30 (A) 11/04/2022 0016   UROBILINOGEN 0.2 01/24/2015 1748   NITRITE NEGATIVE 11/04/2022 0016   LEUKOCYTESUR NEGATIVE 11/04/2022 0016    Lab Results  Component Value Date   BACTERIA RARE (A) 11/04/2022    Pertinent Imaging: KUB today: Images reviewed and discussed with the patient  Results for orders placed in visit on 11/04/22  DG Abd 1 View  Narrative CLINICAL DATA:  Left ureteral stone.  EXAM: ABDOMEN - 1 VIEW  COMPARISON:  CT 11/04/2022.  FINDINGS: 5 mm calcification overlying the mid left pelvis consistent with a stone observed on CT. Unremarkable bowel gas pattern.  IMPRESSION: Left pelvic calcification consistent with  distal left ureteral stone.   Electronically Signed By: Layla Maw M.D. On: 11/05/2022 13:49  Results for orders placed during the hospital encounter of 08/05/19  US Venous Img Lower Bilateral (DVT)  Narrative CLINICAL DATA:  Bilateral lower extremity edema.  EXAM: BILATERAL LOWER EXTREMITY VENOUS DOPPLER ULTRASOUND  TECHNIQUE: Gray-scale sonography with graded compression, as well as color Doppler and duplex ultrasound were performed to evaluate the lower extremity deep venous systems from the level of the common femoral vein and including the common femoral, femoral, profunda femoral, popliteal and calf veins including the posterior tibial, peroneal and gastrocnemius veins when visible. The superficial great saphenous vein was also interrogated. Spectral Doppler was utilized to evaluate flow at rest and with distal augmentation maneuvers in the common femoral, femoral and popliteal veins.  COMPARISON:  None.  FINDINGS: RIGHT LOWER EXTREMITY  Common Femoral Vein: No evidence of thrombus. Normal compressibility, respiratory phasicity and response to augmentation.  Saphenofemoral  Junction: No evidence of thrombus. Normal compressibility and flow on color Doppler imaging.  Profunda Femoral Vein: No evidence of thrombus. Normal compressibility and flow on color Doppler imaging.  Femoral Vein: No evidence of thrombus. Normal compressibility, respiratory phasicity and response to augmentation.  Popliteal Vein: No evidence of thrombus. Normal compressibility, respiratory phasicity and response to augmentation.  Calf Veins: No evidence of thrombus. Normal compressibility and flow on color Doppler imaging.  Superficial Great Saphenous Vein: No evidence of thrombus. Normal compressibility.  Venous Reflux:  None.  Other Findings: No evidence of superficial thrombophlebitis or abnormal fluid collection.  LEFT LOWER EXTREMITY  Common Femoral Vein: No evidence of thrombus. Normal compressibility, respiratory phasicity and response to augmentation.  Saphenofemoral Junction: No evidence of thrombus. Normal compressibility and flow on color Doppler imaging.  Profunda Femoral Vein: No evidence of thrombus. Normal compressibility and flow on color Doppler imaging.  Femoral Vein: No evidence of thrombus. Normal compressibility, respiratory phasicity and response to augmentation.  Popliteal Vein: No evidence of thrombus. Normal compressibility, respiratory phasicity and response to augmentation.  Calf Veins: No evidence of thrombus. Normal compressibility and flow on color Doppler imaging.  Superficial Great Saphenous Vein: No evidence of thrombus. Normal compressibility.  Venous Reflux:  None.  Other Findings: No evidence of superficial thrombophlebitis or abnormal fluid collection.  IMPRESSION: No evidence of deep venous thrombosis in either lower extremity.   Electronically Signed By: Irish Lack M.D. On: 08/05/2019 17:30  No results found for this or any previous visit.  No results found for this or any previous visit.  No results found for  this or any previous visit.  No valid procedures specified. No results found for this or any previous visit.  Results for orders placed during the hospital encounter of 11/04/22  CT Renal Stone Study  Narrative CLINICAL DATA:  c/o lower abdominal pain, N/V/D. States has not been able to void like normal today and also noticed small amount of blood in his urine. Denies any other urinary symptoms Abdominal/flank pain, stone suspected  EXAM: CT ABDOMEN AND PELVIS WITHOUT CONTRAST  TECHNIQUE: Multidetector CT imaging of the abdomen and pelvis was performed following the standard protocol without IV contrast.  RADIATION DOSE REDUCTION: This exam was performed according to the departmental dose-optimization program which includes automated exposure control, adjustment of the mA and/or kV according to patient size and/or use of iterative reconstruction technique.  COMPARISON:  CT abdomen pelvis 10/27/2022, CT abdomen pelvis 12/01/2020  FINDINGS: Lower chest: No acute abnormality.  Hepatobiliary:  Similar-appearing scattered hepatic  hypodensities. The lesions greater than 1 cm measure simple fluid. Subcentimeter hypodensities are too small to characterize. No gallstones, gallbladder wall thickening, or pericholecystic fluid. No biliary dilatation.  Pancreas: No focal lesion. Normal pancreatic contour. No surrounding inflammatory changes. No main pancreatic ductal dilatation.  Spleen: Normal in size without focal abnormality.  Adrenals/Urinary Tract:  No adrenal nodule bilaterally.  4 mm calcified stone within the distal left ureter with associated mild proximal hydronephrosis. Punctate left nephrolithiasis. Trace periureteral fat stranding. No right hydronephrosis. No right nephroureterolithiasis.  The urinary bladder is unremarkable.  Stomach/Bowel: Stomach is within normal limits. No evidence of bowel wall thickening or dilatation. Colonic diverticulosis.  Appendix appears normal.  Vascular/Lymphatic: No abdominal aorta or iliac aneurysm. Mild atherosclerotic plaque of the aorta and its branches. No abdominal, pelvic, or inguinal lymphadenopathy.  Reproductive: Prostate is unremarkable.  Other: No intraperitoneal free fluid. No intraperitoneal free gas. No organized fluid collection.  Musculoskeletal:  No abdominal wall hernia or abnormality.  No suspicious lytic or blastic osseous lesions. No acute displaced fracture. Multilevel degenerative changes of the spine.  IMPRESSION: 1. Obstructive 4 mm distal left ureterolithiasis. Correlate with urinalysis to exclude superimposed infection. 2. Nonobstructive punctate left nephrolithiasis. 3. Colonic diverticulosis with no acute diverticulitis.   Electronically Signed By: Tish Frederickson M.D. On: 11/04/2022 01:32   Assessment & Plan:    1. Kidney stones -followup 6 months with KUB - Urinalysis, Routine w reflex microscopic   No follow-ups on file.  Wilkie Aye, MD  [facility] [place]

## 2022-12-10 ENCOUNTER — Encounter: Payer: Self-pay | Admitting: Urology

## 2022-12-10 DIAGNOSIS — G7289 Other specified myopathies: Secondary | ICD-10-CM | POA: Diagnosis not present

## 2022-12-26 ENCOUNTER — Encounter: Payer: Self-pay | Admitting: Gastroenterology

## 2022-12-26 ENCOUNTER — Ambulatory Visit (INDEPENDENT_AMBULATORY_CARE_PROVIDER_SITE_OTHER): Payer: PPO | Admitting: Gastroenterology

## 2022-12-26 VITALS — BP 116/76 | HR 81 | Temp 97.5°F | Ht 76.0 in | Wt 235.5 lb

## 2022-12-26 DIAGNOSIS — K529 Noninfective gastroenteritis and colitis, unspecified: Secondary | ICD-10-CM

## 2022-12-26 DIAGNOSIS — M1000 Idiopathic gout, unspecified site: Secondary | ICD-10-CM | POA: Diagnosis not present

## 2022-12-26 NOTE — Progress Notes (Signed)
Gastroenterology Office Note     Primary Care Physician:  Carylon Perches, MD  Primary Gastroenterologist: Dr. Jena Gauss    Chief Complaint   Chief Complaint  Patient presents with   Follow-up    Patient here today due to diarrhea. Patient says he is still having issues, but they are not as bad. Patient says he takes imodium prn.     History of Present Illness   Jacob Hunter is a 67 y.o. male presenting today in follow-up with a history of chronic diarrhea. Prior evaluation with normal celiac serologies. Due to history of adenomas, he recently had surveillance colonoscopy as noted below with multiple polyps and segmental colonic biopsies that were negative for microscopic colitis.   Historically, no improvement with dicyclomine prn. Pancreatic elastase normal at 467, normal fecal calprotectin. Samples of Xifaxan were given to patient. At first he didn't see much improvement. However looking back, he notes that he went from multiple postprandial looser stools to only a few. He feels it did help overall now that he thinks back. He was given samples of Creon. He had more urgency with Creon. Postprandial. Having looser stool but not as much. Not eating cheeseburgers all the time. Having 1 mushy bowel movement a day. Soft drinks cause diarrhea. Eggs for breakfast, banana sandwich or cereal for lunch, supper lean meats and veggies. No weight loss. No abdominal pain. No greasy stool.   Colonoscopy April 2024: numerous polyps. tubular adenomas and tubulovillous adenoma. Negative stool sampling and colonic biopsies. 3 year surveillance.        Past Medical History:  Diagnosis Date   Arthritis    Knee pain    Necrotizing myopathy    Pneumonia    Polymyositis [facility])     Past Surgical History:  Procedure Laterality Date   ANKLE SURGERY Left    BIOPSY  11/11/2022   Procedure: BIOPSY;  Surgeon: Corbin Ade, MD;  [facility];  Service: Endoscopy;;   COLONOSCOPY N/A  10/05/2019   Rourk: 3 colonic polyps removed, 2 were tubular adenomas, cecal polyp was sessile serrated polyp.  Advised 3-year surveillance colonoscopy.   COLONOSCOPY WITH PROPOFOL N/A 11/11/2022   Procedure: COLONOSCOPY WITH PROPOFOL;  Surgeon: Corbin Ade, MD;  [facility];  Service: Endoscopy;  Laterality: N/A;  8:15 am ileocolonoscopy   EYE SURGERY     bil cataracts   KNEE SURGERY Left    arthroscopy x 2   left wrist surgery     POLYPECTOMY  10/05/2019   Procedure: POLYPECTOMY;  Surgeon: Corbin Ade, MD;  [facility];  Service: Endoscopy;;   POLYPECTOMY  11/11/2022   Procedure: POLYPECTOMY;  Surgeon: Corbin Ade, MD;  [facility];  Service: Endoscopy;;   TOTAL KNEE ARTHROPLASTY Left 10/04/2020   Procedure: TOTAL KNEE ARTHROPLASTY;  Surgeon: Eugenia Mcalpine, MD;  Location: [facility];  Service: Orthopedics;  Laterality: Left;  adductor canal    Current Outpatient Medications  Medication Sig Dispense Refill   calcium-vitamin D (OSCAL WITH D) 500-200 MG-UNIT tablet Take 1 tablet by mouth daily with breakfast. 30 tablet 2   Coenzyme Q10 (CO Q 10) 100 MG CAPS Take 100 mg by mouth daily.     folic acid (FOLVITE) 1 MG tablet Take 1 tablet (1 mg total) by mouth daily. 30 tablet 0   methotrexate (RHEUMATREX) 2.5 MG tablet Take 25 mg by mouth every Monday.      Multiple Vitamin (MULTIVITAMIN) tablet Take  1 tablet by mouth daily.     ondansetron (ZOFRAN-ODT) 8 MG disintegrating tablet Take 1 tablet (8 mg total) by mouth every 8 (eight) hours as needed for nausea or vomiting. 20 tablet 0   OVER THE COUNTER MEDICATION Vit D 3 daily.     predniSONE (DELTASONE) 10 MG tablet Take 12.5 mg by mouth daily with breakfast.     PRESCRIPTION MEDICATION IVIG Antibodies 2 visits every 4 weeks     riTUXimab (RITUXAN) 100 MG/10ML injection Inject 1,000 mg into the vein See admin instructions. 2 times a month in March and September     vitamin B-12 (CYANOCOBALAMIN) 1000 MCG  tablet Take 1,000 mcg by mouth daily.     vitamin k 100 MCG tablet Take 150 mcg by mouth daily.     No current facility-administered medications for this visit.    Allergies as of 12/26/2022 - Review Complete 12/26/2022  Allergen Reaction Noted   Methylprednisolone Anaphylaxis, Shortness Of Breath, and Rash 10/14/2015   Demerol [meperidine] Hives 10/27/2019   Statins Other (See Comments) 10/14/2015    Family History  Problem Relation Age of Onset   Hypertension Mother    Hypertension Father    Hypercholesterolemia Father    Hypercholesterolemia Sister    Breast cancer Sister    Colon cancer Neg Hx    Liver disease Neg Hx    Inflammatory bowel disease Neg Hx     Social History   Socioeconomic History   Marital status: Married    Spouse name: Not on file   Number of children: Not on file   Years of education: Not on file   Highest education level: Not on file  Occupational History   Not on file  Tobacco Use   Smoking status: Never   Smokeless tobacco: Never  Vaping Use   Vaping Use: Never used  Substance and Sexual Activity   Alcohol use: No    Alcohol/week: 0.0 standard drinks of alcohol    Comment: Last ETOH was 1-2 drinks in 1990.   Drug use: No   Sexual activity: Yes    Birth control/protection: None  Other Topics Concern   Not on file  Social History Narrative   Not on file   Social Determinants of Health   Financial Resource Strain: Low Risk  (11/01/2022)   Overall Financial Resource Strain (CARDIA)    Difficulty of Paying Living Expenses: Not hard at all  Food Insecurity: No Food Insecurity (11/01/2022)   Hunger Vital Sign    Worried About Running Out of Food in the Last Year: Never true    Ran Out of Food in the Last Year: Never true  Transportation Needs: No Transportation Needs (11/01/2022)   PRAPARE - Administrator, Civil Service (Medical): No    Lack of Transportation (Non-Medical): No  Physical Activity: Not on file  Stress: Not on  file  Social Connections: Not on file  Intimate Partner Violence: Not on file     Review of Systems   Gen: Denies any fever, chills, fatigue, weight loss, lack of appetite.  CV: Denies chest pain, heart palpitations, peripheral edema, syncope.  Resp: Denies shortness of breath at rest or with exertion. Denies wheezing or cough.  GI: Denies dysphagia or odynophagia. Denies jaundice, hematemesis, fecal incontinence. GU : Denies urinary burning, urinary frequency, urinary hesitancy MS: Denies joint pain, muscle weakness, cramps, or limitation of movement.  Derm: Denies rash, itching, dry skin Psych: Denies depression, anxiety, memory loss, and confusion  Heme: Denies bruising, bleeding, and enlarged lymph nodes.   Physical Exam   BP 116/76 (BP Location: Left Arm, Patient Position: Sitting, Cuff Size: Large)   Pulse 81   Temp (!) 97.5 F (36.4 C) (Temporal)   Ht 6\' 4"  (1.93 m)   Wt 235 lb 8 oz (106.8 kg)   BMI 28.67 kg/m  General:   Alert and oriented. Pleasant and cooperative. Well-nourished and well-developed.  Head:  Normocephalic and atraumatic. Eyes:  Without icterus Abdomen:  +BS, soft, non-tender and non-distended. No HSM noted. No guarding or rebound. No masses appreciated.  Rectal:  Deferred  Msk:  Symmetrical without gross deformities. Normal posture. Extremities:  Without edema. Neurologic:  Alert and  oriented x4;  grossly normal neurologically. Skin:  Intact without significant lesions or rashes. Psych:  Alert and cooperative. Normal mood and affect.   Assessment   Jacob Hunter is a 67 y.o. male presenting today in follow-up with a history of  chronic diarrhea. Prior evaluation with normal celiac serologies.    Chronic diarrhea: recent colonoscopy with multiple adenomas and negative colonic biopsies. No alarm signs/symptoms. Prior evaluation as in HPI. He noted improvement with Xifaxan. He has noted a significant change with changing diet as well, now to  only 1 mushy stool per day. I recommend high fiber diet and to continue avoiding the trigger foods (fatty foods, "cheeseburgers").   History of adenomas: will be due for 3 year surveillance in 2027.     PLAN    Continue diet/behavior modification Imodium prn High fiber diet Return in 6 months Call with any concerns Colonoscopy 2027   Gelene Mink, PhD, [facility]

## 2022-12-26 NOTE — Patient Instructions (Signed)
I recommend trying a high fiber diet. If this backfires, back off. However, I think it will be helpful with stool formation.  It's ok to take Imodium if going out somewhere just as needed.  We will see you in 6 months!  I enjoyed seeing you again today! I value our relationship and want to provide genuine, compassionate, and quality care. You may receive a survey regarding your visit with me, and I welcome your feedback! Thanks so much for taking the time to complete this. I look forward to seeing you again.      Gelene Mink, PhD, ANP-BC [facility] Gastroenterology

## 2023-01-07 DIAGNOSIS — G7289 Other specified myopathies: Secondary | ICD-10-CM | POA: Diagnosis not present

## 2023-01-10 DIAGNOSIS — M109 Gout, unspecified: Secondary | ICD-10-CM | POA: Diagnosis not present

## 2023-01-10 DIAGNOSIS — N200 Calculus of kidney: Secondary | ICD-10-CM | POA: Diagnosis not present

## 2023-01-21 DIAGNOSIS — G7289 Other specified myopathies: Secondary | ICD-10-CM | POA: Diagnosis not present

## 2023-02-02 ENCOUNTER — Other Ambulatory Visit: Payer: Self-pay

## 2023-02-02 ENCOUNTER — Encounter (HOSPITAL_COMMUNITY): Payer: Self-pay | Admitting: Emergency Medicine

## 2023-02-02 ENCOUNTER — Emergency Department (HOSPITAL_COMMUNITY)
Admission: EM | Admit: 2023-02-02 | Discharge: 2023-02-02 | Disposition: A | Discharge status: Home / Self Care | Payer: PPO | Attending: Emergency Medicine | Admitting: Emergency Medicine

## 2023-02-02 DIAGNOSIS — W2209XA Striking against other stationary object, initial encounter: Secondary | ICD-10-CM | POA: Insufficient documentation

## 2023-02-02 DIAGNOSIS — M79604 Pain in right leg: Secondary | ICD-10-CM | POA: Diagnosis not present

## 2023-02-02 DIAGNOSIS — R55 Syncope and collapse: Secondary | ICD-10-CM | POA: Diagnosis not present

## 2023-02-02 DIAGNOSIS — S81811A Laceration without foreign body, right lower leg, initial encounter: Secondary | ICD-10-CM | POA: Diagnosis not present

## 2023-02-02 MED ORDER — LIDOCAINE HCL (PF) 1 % IJ SOLN
30.0000 mL | Freq: Once | INTRAMUSCULAR | Status: AC
  Administered 2023-02-02: 30 mL
  Filled 2023-02-02: qty 30

## 2023-02-02 MED ORDER — TRIPLE ANTIBIOTIC 3.5-400-5000 EX OINT
TOPICAL_OINTMENT | Freq: Once | CUTANEOUS | Status: AC
  Filled 2023-02-02: qty 1

## 2023-02-02 NOTE — ED Triage Notes (Signed)
Pt arrived by [facility]. Pt cut his right leg on a wheelchair which caused him to have pain triggering a panic attack. Pt suffers from necrotizing myopathy which the patient states these panic attacks can come from that auto immune disorder. Pt is alert and calm in triage.

## 2023-02-02 NOTE — ED Provider Notes (Signed)
[place] EMERGENCY DEPARTMENT AT [facility] Provider Note   CSN: [id number] Arrival date & time: 02/02/23  1415     History  Chief Complaint  Patient presents with   Panic Attack    Jacob Hunter is a 67 y.o. male.  [facility]ent[facility] the ED today for a health valuation.  He has history of necrotizing myopathy.  States he was helping his father in a wheelchair today and hit his right low wheelchair.  He sustained a laceration.  He reports due to his chronic medical condition when he experiences pain he gets very nauseous and starts to hyperventilate and sometimes passes out.  He reports that normally he is able to control his breathing with the help of his wife but they were in a car and it was hot outside and they were unable to do this and he had a brief syncopal episode.  This happened in the past multiple times, he had no chest pain or palpitations, no head injury or fall.  He presents for evaluation primarily of the laceration on his right leg.  Tetanus shot is up-to-date.  He is not on blood thinners.  HPI     Home Medications Prior to Admission medications   Medication Sig Start Date End Date Taking? Authorizing Provider  calcium-vitamin D (OSCAL WITH D) 500-200 MG-UNIT tablet Take 1 tablet by mouth daily with breakfast. 07/30/19   Mariea Clonts, Courage, MD  Coenzyme Q10 (CO Q 10) 100 MG CAPS Take 100 mg by mouth daily.    [provider]  folic acid (FOLVITE) 1 MG tablet Take 1 tablet (1 mg total) by mouth daily. 07/29/19   Shon Hale, MD  methotrexate (RHEUMATREX) 2.5 MG tablet Take 25 mg by mouth every Monday.     [provider]  Multiple Vitamin (MULTIVITAMIN) tablet Take 1 tablet by mouth daily.    [provider]  ondansetron (ZOFRAN-ODT) 8 MG disintegrating tablet Take 1 tablet (8 mg total) by mouth every 8 (eight) hours as needed for nausea or vomiting. 11/04/22   Dione Booze, MD  OVER THE COUNTER MEDICATION Vit D 3 daily.    [provider]  predniSONE (DELTASONE) 10 MG tablet Take 12.5 mg by mouth daily with breakfast.    [provider]  PRESCRIPTION MEDICATION IVIG Antibodies 2 visits every 4 weeks    [provider]  riTUXimab (RITUXAN) 100 MG/10ML injection Inject 1,000 mg into the vein See admin instructions. 2 times a month in March and September    [provider]  vitamin B-12 (CYANOCOBALAMIN) 1000 MCG tablet Take 1,000 mcg by mouth daily.    [provider]  vitamin k 100 MCG tablet Take 150 mcg by mouth daily.    [provider]      Allergies    Methylprednisolone, Demerol [meperidine], and Statins    Review of Systems   Review of Systems  Physical Exam Updated Vital Signs Temp (!) 97.5 F (36.4 C) (Oral)   Ht 6\' 1"  (1.854 m)   Wt 103 kg   BMI 29.95 kg/m  Physical Exam Vitals and nursing note reviewed.  Constitutional:      General: He is not in acute distress.    Appearance: He is well-developed.  HENT:     Head: Normocephalic and atraumatic.  Eyes:     Conjunctiva/sclera: Conjunctivae normal.  Cardiovascular:     Rate and Rhythm: Normal rate and regular rhythm.     Heart sounds: No murmur heard.  Pulmonary:     Effort: Pulmonary effort is normal. No respiratory distress.     Breath sounds: Normal breath sounds.  Abdominal:     Palpations: Abdomen is soft.     Tenderness: There is no abdominal tenderness.  Musculoskeletal:        General: No swelling.     Cervical back: Neck supple.  Skin:    General: Skin is warm and dry.     Capillary Refill: Capillary refill takes less than 2 seconds.     Comments: 3Centimeter laceration right lateral lower leg with mild bleeding.  No foreign body.  Mild gaping.  Neurological:     General: No focal deficit present.     Mental Status: He is alert and oriented to person, place, and time.  Psychiatric:        Mood and Affect: Mood normal.     ED Results / Procedures / Treatments   Labs (all  labs ordered are listed, but only abnormal results are displayed) Labs Reviewed - No data to display  EKG None  Radiology No results found.  Procedures .Marland KitchenLaceration Repair  Date/Time: 02/02/2023 5:08 PM  Performed by: Ma Rings, PA-C Authorized by: Ma Rings, PA-C   Consent:    Consent obtained:  Verbal   Consent given by:  Patient   Risks discussed:  Infection, pain, poor cosmetic result and poor wound healing Universal protocol:    Patient identity confirmed:  Verbally with patient Laceration details:    Location:  Leg   Leg location:  R lower leg   Length (cm):  3 Exploration:    Contaminated: no   Treatment:    Area cleansed with:  Povidone-iodine   Amount of cleaning:  Standard   Irrigation solution:  Sterile saline   Irrigation method:  Syringe   Visualized foreign bodies/material removed: no     Debridement:  None Skin repair:    Repair method:  Sutures   Suture size:  4-0   Suture material:  Nylon   Suture technique:  Simple interrupted   Number of sutures:  5 Approximation:    Approximation:  Close Repair type:    Repair type:  Simple Post-procedure details:    Dressing:  Antibiotic ointment and adhesive bandage     Medications Ordered in ED Medications  lidocaine (PF) (XYLOCAINE) 1 % injection 30 mL (has no administration in time range)  neomycin-bacitracin-polymyxin [phone] OINT (has no administration in time range)    ED Course/ Medical Decision Making/ A&P                             Medical Decision Making This patient presents to the ED for concern of panic attack resulting in syncope and laceration to the right leg, this involves an extensive number of treatment options, and is a complaint that carries with it a high risk of complications and morbidity.  The differential diagnosis includes panic disorder, vasovagal syncope, arrhythmia, laceration, contusion, other    Additional history obtained:  Additional history  obtained from EMR External records from outside source obtained and reviewed including prior notes, nursing notes from this   Problem List / ED Course / Critical interventions / Medication management  Syncope-patient has had multiple times the past, due to his autoimmune condition he states when he has pain he hyperventilates and pass out if he is not able to control his breathing, no chest pain or shortness of breath,  EKG reassuring.  No further indication for more workup regarding this, he can follow-up with his PCP  Laceration-patient has a 3 cm laceration to the right leg, tetanus shot is up-to-date, repaired as noted in the chart with 5 sutures, advised on wound care and follow-up.  He cut it on a wheelchair which is intact there is no foreign body concern, no sensation and no visualized foreign body exam.  He is able to bear weight, do not feel imaging is needed at this time. I ordered medication including lidocaine for local anesthesia Reevaluation of the patient after these medicines showed that the patient improved I have reviewed the patients home medicines and have made adjustments as needed      Risk OTC drugs. Prescription drug management.           Final Clinical Impression(s) / ED Diagnoses Final diagnoses:  Syncope, unspecified syncope type  Laceration of right lower extremity, initial encounter    Rx / DC Orders ED Discharge Orders     None         Josem Kaufmann 02/02/23 1754    Gerhard Munch, MD 02/03/23 [phone]

## 2023-02-02 NOTE — Discharge Instructions (Signed)
Evaluated you today after you are anxiety attack and you passed out.  Follow-up with your primary care doctor regarding this.  Your EKG was reassuring.  You had a laceration on your leg repaired with sutures.  These need to come out in about a week.  Keep the area clean and dry and using antibiotic ointment daily.  Come back if you have redness, swelling, drainage or fever.

## 2023-02-06 ENCOUNTER — Other Ambulatory Visit: Payer: Self-pay | Admitting: Urology

## 2023-02-07 ENCOUNTER — Telehealth: Payer: Self-pay

## 2023-02-07 NOTE — Telephone Encounter (Signed)
Transition Care Management Follow-up Telephone Call Date of discharge and from where: [facility] How have you been since you were released from the hospital? Doing great  Any questions or concerns? No  Items Reviewed: Did the pt receive and understand the discharge instructions provided? Yes  Medications obtained and verified? Yes  Other? No  Any new allergies since your discharge? No  Dietary orders reviewed? No Do you have support at home? Yes     Follow up appointments reviewed: PCP Hospital f/u appt confirmed? Yes  Scheduled to see  on  @ . Specialist Hospital f/u appt confirmed? No  Scheduled to see  on  @ . Are transportation arrangements needed? No  If their condition worsens, is the pt aware to call PCP or go to the Emergency Dept.? Yes Was the patient provided with contact information for the PCP's office or ED? Yes Was to pt encouraged to call back with questions or concerns? Yes

## 2023-02-09 ENCOUNTER — Other Ambulatory Visit: Payer: Self-pay

## 2023-02-09 ENCOUNTER — Emergency Department (HOSPITAL_COMMUNITY)
Admission: EM | Admit: 2023-02-09 | Discharge: 2023-02-09 | Disposition: A | Discharge status: Home / Self Care | Payer: PPO | Attending: Emergency Medicine | Admitting: Emergency Medicine

## 2023-02-09 ENCOUNTER — Encounter (HOSPITAL_COMMUNITY): Payer: Self-pay

## 2023-02-09 DIAGNOSIS — T7840XA Allergy, unspecified, initial encounter: Secondary | ICD-10-CM

## 2023-02-09 DIAGNOSIS — R9431 Abnormal electrocardiogram [ECG] [EKG]: Secondary | ICD-10-CM | POA: Diagnosis not present

## 2023-02-09 HISTORY — DX: Gout, unspecified: M10.9

## 2023-02-09 MED ORDER — LIDOCAINE HCL URETHRAL/MUCOSAL 2 % EX GEL: 1.0000 | Freq: Once | CUTANEOUS | Status: DC

## 2023-02-09 MED ORDER — METHYLPREDNISOLONE SODIUM SUCC 125 MG IJ SOLR: 125.0000 mg | Freq: Once | INTRAMUSCULAR | Status: AC

## 2023-02-09 MED ORDER — LORAZEPAM 2 MG/ML IJ SOLN
1.0000 mg | Freq: Once | INTRAMUSCULAR | Status: AC
  Administered 2023-02-09: 1 mg via INTRAVENOUS

## 2023-02-09 MED ORDER — EPINEPHRINE 0.3 MG/0.3ML IJ SOAJ: 0.3000 mg | INTRAMUSCULAR | 1 refills | Status: AC | PRN

## 2023-02-09 MED ORDER — DIPHENHYDRAMINE HCL 50 MG/ML IJ SOLN
INTRAMUSCULAR | Status: AC
  Administered 2023-02-09: 50 mg
  Filled 2023-02-09: qty 1

## 2023-02-09 MED ORDER — LORAZEPAM 2 MG/ML IJ SOLN
0.5000 mg | Freq: Once | INTRAMUSCULAR | Status: AC
  Administered 2023-02-09: 0.5 mg via INTRAVENOUS
  Filled 2023-02-09: qty 1

## 2023-02-09 MED ORDER — DIPHENHYDRAMINE HCL 25 MG PO TABS: 25.0000 mg | ORAL_TABLET | Freq: Four times a day (QID) | ORAL | 0 refills | Status: DC

## 2023-02-09 MED ORDER — EPINEPHRINE 0.3 MG/0.3ML IJ SOAJ: 0.3000 mg | Freq: Once | INTRAMUSCULAR | Status: AC

## 2023-02-09 MED ORDER — EPINEPHRINE 0.3 MG/0.3ML IJ SOAJ
INTRAMUSCULAR | Status: AC
  Administered 2023-02-09: 0.3 mg via INTRAMUSCULAR
  Filled 2023-02-09: qty 0.3

## 2023-02-09 MED ORDER — SODIUM CHLORIDE 0.9 % IV SOLN: 50.0000 mg | INTRAVENOUS | Status: DC | PRN

## 2023-02-09 MED ORDER — SODIUM CHLORIDE 0.9 % IV SOLN: 25.0000 mg | INTRAVENOUS | Status: DC | PRN

## 2023-02-09 MED ORDER — FAMOTIDINE IN NACL 20-0.9 MG/50ML-% IV SOLN: 20.0000 mg | Freq: Once | INTRAVENOUS | Status: AC

## 2023-02-09 MED ORDER — FAMOTIDINE IN NACL 20-0.9 MG/50ML-% IV SOLN
INTRAVENOUS | Status: AC
  Administered 2023-02-09: 20 mg via INTRAVENOUS
  Filled 2023-02-09: qty 50

## 2023-02-09 MED ORDER — FAMOTIDINE 20 MG PO TABS: 20.0000 mg | ORAL_TABLET | Freq: Two times a day (BID) | ORAL | 0 refills | Status: DC

## 2023-02-09 MED ORDER — METHYLPREDNISOLONE SODIUM SUCC 125 MG IJ SOLR
INTRAMUSCULAR | Status: AC
  Administered 2023-02-09: 125 mg via INTRAVENOUS
  Filled 2023-02-09: qty 2

## 2023-02-09 NOTE — ED Triage Notes (Signed)
Pt arrived via POV c/o allergic reaction. Pt reports he might be having an allergic reaction to his new Gout medication. Pt reports symptoms began with itching and tingling in hands, then he began having a sensation of his throat closing up on him . Pt reports having 50mg  Benadryl PTA.

## 2023-02-09 NOTE — ED Provider Notes (Signed)
[facility] Emergency Department Provider Note MRN:  [id number]  Arrival date & time: 02/09/23     Chief Complaint   Allergic Reaction   History of Present Illness   Jacob Hunter is a 67 y.o. year-old male with a history of polymyositis presenting to the ED with chief complaint of allergic reaction.  At about 11 PM patient started feeling some total body itchiness, found that he had a total body rash.  Then he was having some sensation of throat closure, sensation of tongue swelling, here for evaluation.  Review of Systems  A thorough review of systems was obtained and all systems are negative except as noted in the HPI and PMH.   Patient's Health History    Past Medical History:  Diagnosis Date   Arthritis    Gout    Knee pain    Necrotizing myopathy    Pneumonia    Polymyositis [facility])     Past Surgical History:  Procedure Laterality Date   ANKLE SURGERY Left    BIOPSY  11/11/2022   Procedure: BIOPSY;  Surgeon: Corbin Ade, MD;  Location: [facility];  Service: Endoscopy;;   COLONOSCOPY N/A 10/05/2019   Rourk: 3 colonic polyps removed, 2 were tubular adenomas, cecal polyp was sessile serrated polyp.  Advised 3-year surveillance colonoscopy.   COLONOSCOPY WITH PROPOFOL N/A 11/11/2022   Procedure: COLONOSCOPY WITH PROPOFOL;  Surgeon: Corbin Ade, MD;  Location: [facility];  Service: Endoscopy;  Laterality: N/A;  8:15 am ileocolonoscopy   EYE SURGERY     bil cataracts   KNEE SURGERY Left    arthroscopy x 2   left wrist surgery     POLYPECTOMY  10/05/2019   Procedure: POLYPECTOMY;  Surgeon: Corbin Ade, MD;  Location: [facility];  Service: Endoscopy;;   POLYPECTOMY  11/11/2022   Procedure: POLYPECTOMY;  Surgeon: Corbin Ade, MD;  Location: [facility];  Service: Endoscopy;;   TOTAL KNEE ARTHROPLASTY Left 10/04/2020   Procedure: TOTAL KNEE ARTHROPLASTY;  Surgeon: Eugenia Mcalpine, MD;  Location: [facility];  Service: Orthopedics;   Laterality: Left;  adductor canal    Family History  Problem Relation Age of Onset   Hypertension Mother    Hypertension Father    Hypercholesterolemia Father    Hypercholesterolemia Sister    Breast cancer Sister    Colon cancer Neg Hx    Liver disease Neg Hx    Inflammatory bowel disease Neg Hx     Social History   Socioeconomic History   Marital status: Married    Spouse name: Not on file   Number of children: Not on file   Years of education: Not on file   Highest education level: Not on file  Occupational History   Not on file  Tobacco Use   Smoking status: Never   Smokeless tobacco: Never  Vaping Use   Vaping Use: Never used  Substance and Sexual Activity   Alcohol use: No    Alcohol/week: 0.0 standard drinks of alcohol    Comment: Last ETOH was 1-2 drinks in 1990.   Drug use: No   Sexual activity: Yes    Birth control/protection: None  Other Topics Concern   Not on file  Social History Narrative   Not on file   Social Determinants of Health   Financial Resource Strain: Low Risk  (11/01/2022)   Overall Financial Resource Strain (CARDIA)    Difficulty of Paying Living Expenses: Not hard at  all  Food Insecurity: No Food Insecurity (11/01/2022)   Hunger Vital Sign    Worried About Running Out of Food in the Last Year: Never true    Ran Out of Food in the Last Year: Never true  Transportation Needs: No Transportation Needs (11/01/2022)   PRAPARE - Administrator, Civil Service (Medical): No    Lack of Transportation (Non-Medical): No  Physical Activity: Not on file  Stress: Not on file  Social Connections: Not on file  Intimate Partner Violence: Not on file     Physical Exam   Vitals:   02/09/23 0300 02/09/23 0400  BP: 118/66 122/73  Pulse: 71 67  Resp: 13 13  Temp:    SpO2: 93% 95%    CONSTITUTIONAL: Well-appearing, NAD NEURO/PSYCH:  Alert and oriented x 3, no focal deficits EYES:  eyes equal and reactive ENT/NECK:  no LAD, no  JVD CARDIO: Regular rate, well-perfused, normal S1 and S2 PULM:  CTAB no wheezing or rhonchi GI/GU:  non-distended, non-tender MSK/SPINE:  No gross deformities, no edema SKIN: Faint macular rash diffusely   *Additional and/or pertinent findings included in MDM below  Diagnostic and Interventional Summary    EKG Interpretation Date/Time:  Sunday February 09 2023 00:25:17 EDT Ventricular Rate:  86 PR Interval:  147 QRS Duration:  100 QT Interval:  391 QTC Calculation: 468 R Axis:   54  Text Interpretation: Sinus rhythm Confirmed by Kennis Carina [phone]) on 02/09/2023 12:59:09 AM       Labs Reviewed - No data to display  No orders to display    Medications  diphenhydrAMINE (BENADRYL) 50 mg in sodium chloride 0.9 % 50 mL IVPB (has no administration in time range)  methylPREDNISolone sodium succinate (SOLU-MEDROL) 125 mg/2 mL injection 125 mg (125 mg Intravenous Given 02/09/23 0030)  famotidine (PEPCID) IVPB 20 mg premix (0 mg Intravenous Stopped 02/09/23 0103)  EPINEPHrine (EPI-PEN) injection 0.3 mg (0.3 mg Intramuscular Given 02/09/23 0029)  LORazepam (ATIVAN) injection 0.5 mg (0.5 mg Intravenous Given 02/09/23 0121)  LORazepam (ATIVAN) injection 1 mg (1 mg Intravenous Given 02/09/23 0122)  diphenhydrAMINE (BENADRYL) 50 MG/ML injection (50 mg  Given 02/09/23 0121)     Procedures  /  Critical Care Procedures  ED Course and Medical Decision Making  Initial Impression and Ddx Suspect allergic reaction.  Given the possible airway involvement, patient given EpiPen, he took Benadryl prior to arrival, we also gave him Solu-Medrol and Pepcid.  I do not see any impending airway concerns at this time, his tongue looks within the range of normal, per wife it might be a tiny bit bigger than normal.  He has no voice change, he has no stridor, no increased work of breathing.  Will monitor closely.  Past medical/surgical history that increases complexity of ED encounter: None  Interpretation of  Diagnostics I personally reviewed the EKG and my interpretation is as follows: Sinus rhythm    Patient Reassessment and Ultimate Disposition/Management     Patient much improved, sleeping comfortably, observed for 4 hours after epinephrine.  Appropriate for discharge.  Patient management required discussion with the following services or consulting groups:  None  Complexity of Problems Addressed Acute illness or injury that poses threat of life of bodily function  Additional Data Reviewed and Analyzed Further history obtained from: Further history from spouse/family member  Additional Factors Impacting ED Encounter Risk Prescriptions  Elmer Sow. Pilar Plate, MD [facility] Emergency Medicine [facility] [email or web address]  Final Clinical Impressions(s) /  ED Diagnoses     ICD-10-CM   1. Allergic reaction, initial encounter  T78.40XA       ED Discharge Orders          Ordered    EPINEPHrine 0.3 mg/0.3 mL IJ SOAJ injection  As needed        02/09/23 0416    diphenhydrAMINE (BENADRYL) 25 MG tablet  Every 6 hours        02/09/23 0416    famotidine (PEPCID) 20 MG tablet  2 times daily        02/09/23 0416             Discharge Instructions Discussed with and Provided to Patient:     Discharge Instructions      You were evaluated in the Emergency Department and after careful evaluation, we did not find any emergent condition requiring admission or further testing in the hospital.  Your exam/testing today is overall reassuring.  Recommend follow-up with the allergy specialist for further testing.  Use the Benadryl and Pepcid as needed for itching or rash.  Use the EpiPen for more severe symptoms such as sensation of throat swelling.  Please return to the Emergency Department if you experience any worsening of your condition.   Thank you for allowing [place] to be a part of your care.       Sabas Sous, MD 02/09/23 [phone]

## 2023-02-09 NOTE — Discharge Instructions (Addendum)
You were evaluated in the Emergency Department and after careful evaluation, we did not find any emergent condition requiring admission or further testing in the hospital.  Your exam/testing today is overall reassuring.  Recommend follow-up with the allergy specialist for further testing.  Use the Benadryl and Pepcid as needed for itching or rash.  Use the EpiPen for more severe symptoms such as sensation of throat swelling.  Please return to the Emergency Department if you experience any worsening of your condition.   Thank you for allowing [place] to be a part of your care.

## 2023-02-10 ENCOUNTER — Emergency Department (HOSPITAL_COMMUNITY): Payer: PPO

## 2023-02-10 ENCOUNTER — Emergency Department (HOSPITAL_COMMUNITY)
Admission: EM | Admit: 2023-02-10 | Discharge: 2023-02-10 | Disposition: A | Discharge status: Home / Self Care | Payer: PPO | Attending: Emergency Medicine | Admitting: Emergency Medicine

## 2023-02-10 ENCOUNTER — Other Ambulatory Visit: Payer: Self-pay

## 2023-02-10 ENCOUNTER — Encounter (HOSPITAL_COMMUNITY): Payer: Self-pay | Admitting: Emergency Medicine

## 2023-02-10 DIAGNOSIS — S81811A Laceration without foreign body, right lower leg, initial encounter: Secondary | ICD-10-CM | POA: Diagnosis not present

## 2023-02-10 DIAGNOSIS — W208XXA Other cause of strike by thrown, projected or falling object, initial encounter: Secondary | ICD-10-CM | POA: Diagnosis not present

## 2023-02-10 DIAGNOSIS — Z96652 Presence of left artificial knee joint: Secondary | ICD-10-CM | POA: Diagnosis not present

## 2023-02-10 DIAGNOSIS — Z471 Aftercare following joint replacement surgery: Secondary | ICD-10-CM | POA: Diagnosis not present

## 2023-02-10 DIAGNOSIS — S86222A Laceration of muscle(s) and tendon(s) of anterior muscle group at lower leg level, left leg, initial encounter: Secondary | ICD-10-CM | POA: Diagnosis not present

## 2023-02-10 DIAGNOSIS — S81812A Laceration without foreign body, left lower leg, initial encounter: Secondary | ICD-10-CM | POA: Diagnosis not present

## 2023-02-10 DIAGNOSIS — L509 Urticaria, unspecified: Secondary | ICD-10-CM | POA: Diagnosis not present

## 2023-02-10 MED ORDER — LIDOCAINE HCL (PF) 2 % IJ SOLN
INTRAMUSCULAR | Status: AC
  Administered 2023-02-10: 5 mL
  Filled 2023-02-10: qty 5

## 2023-02-10 NOTE — ED Triage Notes (Signed)
Pt via POV c/o ~3-in laceration to left shin after he accidentally struck himself in the leg with a crowbar about 1 hour PTA. Last tetanus shot was last year.

## 2023-02-10 NOTE — Discharge Instructions (Signed)
Elevate your leg when possible.  Keep the area clean and bandaged.  Sutures out in 8 to 10 days.  Return emergency department for any new or worsening symptoms or signs of infection

## 2023-02-10 NOTE — ED Provider Notes (Signed)
[place] EMERGENCY DEPARTMENT AT [facility] Provider Note   CSN: [id number] Arrival date & time: 02/10/23  1430     History  Chief Complaint  Patient presents with   Laceration    Jacob Hunter is a 67 y.o. male.   Laceration Associated symptoms: no fever         Jacob Hunter is a 67 y.o. male who presents to the Emergency Department complaining of laceration left lower leg.  States he dropped a crowbar and it struck his lower leg.  Bleeding controlled with direct pressure.  He denies numbness of the extremity or foot.  Does not take blood thinners.  Tetanus is up-to-date.   Home Medications Prior to Admission medications   Medication Sig Start Date End Date Taking? Authorizing Provider  calcium-vitamin D (OSCAL WITH D) 500-200 MG-UNIT tablet Take 1 tablet by mouth daily with breakfast. 07/30/19   Mariea Clonts, Courage, MD  Coenzyme Q10 (CO Q 10) 100 MG CAPS Take 100 mg by mouth daily.    [provider]  diphenhydrAMINE (BENADRYL) 25 MG tablet Take 1 tablet (25 mg total) by mouth every 6 (six) hours. 02/09/23   Sabas Sous, MD  EPINEPHrine 0.3 mg/0.3 mL IJ SOAJ injection Inject 0.3 mg into the muscle as needed for anaphylaxis. 02/09/23   Sabas Sous, MD  famotidine (PEPCID) 20 MG tablet Take 1 tablet (20 mg total) by mouth 2 (two) times daily. 02/09/23   Sabas Sous, MD  folic acid (FOLVITE) 1 MG tablet Take 1 tablet (1 mg total) by mouth daily. 07/29/19   Shon Hale, MD  methotrexate (RHEUMATREX) 2.5 MG tablet Take 25 mg by mouth every Monday.     [provider]  Multiple Vitamin (MULTIVITAMIN) tablet Take 1 tablet by mouth daily.    [provider]  ondansetron (ZOFRAN-ODT) 8 MG disintegrating tablet Take 1 tablet (8 mg total) by mouth every 8 (eight) hours as needed for nausea or vomiting. 11/04/22   Dione Booze, MD  OVER THE COUNTER MEDICATION Vit D 3 daily.    [provider]  predniSONE (DELTASONE) 10  MG tablet Take 12.5 mg by mouth daily with breakfast.    [provider]  PRESCRIPTION MEDICATION IVIG Antibodies 2 visits every 4 weeks    [provider]  riTUXimab (RITUXAN) 100 MG/10ML injection Inject 1,000 mg into the vein See admin instructions. 2 times a month in March and September    [provider]  tamsulosin (FLOMAX) 0.4 MG CAPS capsule TAKE 1 CAPSULE BY MOUTH EVERY DAY 02/09/23   McKenzie, Mardene Celeste, MD  vitamin B-12 (CYANOCOBALAMIN) 1000 MCG tablet Take 1,000 mcg by mouth daily.    [provider]  vitamin k 100 MCG tablet Take 150 mcg by mouth daily.    [provider]      Allergies    Methylprednisolone, Nsaids, Demerol [meperidine], and Statins    Review of Systems   Review of Systems  Constitutional:  Negative for chills and fever.  Respiratory:  Negative for shortness of breath.   Cardiovascular:  Negative for chest pain and leg swelling.  Gastrointestinal:  Negative for abdominal pain, diarrhea, nausea and vomiting.  Musculoskeletal:  Negative for arthralgias and gait problem.  Skin:  Positive for wound.       Laceration left lower leg  Neurological:  Negative for weakness and numbness.    Physical Exam Updated Vital Signs BP 129/78   Pulse 75   Temp  98.2 F (36.8 C) (Oral)   Resp 18   Ht 6\' 1"  (1.854 m)   Wt 103 kg   SpO2 100%   BMI 29.95 kg/m  Physical Exam Vitals and nursing note reviewed.  Constitutional:      General: He is not in acute distress.    Appearance: Normal appearance. He is not toxic-appearing.  Cardiovascular:     Rate and Rhythm: Normal rate and regular rhythm.     Pulses: Normal pulses.  Pulmonary:     Effort: Pulmonary effort is normal.  Musculoskeletal:        General: Signs of injury present. Normal range of motion.     Left lower leg: Laceration and tenderness present. No bony tenderness.     Comments: V-shaped laceration to the anterior aspect of the left lower leg.  Bleeding  controlled.  No obvious foreign bodies.  Skin:    General: Skin is warm.     Capillary Refill: Capillary refill takes less than 2 seconds.  Neurological:     General: No focal deficit present.     Mental Status: He is alert.     Sensory: No sensory deficit.     Motor: No weakness.     ED Results / Procedures / Treatments   Labs (all labs ordered are listed, but only abnormal results are displayed) Labs Reviewed - No data to display  EKG None  Radiology DG Tibia/Fibula Left  Result Date: 02/10/2023 CLINICAL DATA:  3 inch laceration to the left shin. EXAM: LEFT TIBIA AND FIBULA - 2 VIEW COMPARISON:  None Available. FINDINGS: Status post left knee replacement. No evidence for an acute fracture in the tibia or fibula. Deformity of the calcaneus on the lateral projection suggests remote trauma. Soft tissue gas noted antero medial soft tissues of the leg, consistent with the reported history of laceration. No evidence for retained radiopaque soft tissue foreign body. IMPRESSION: 1. Soft tissue gas in the antero medial soft tissues of the leg consistent with the reported history of laceration. No evidence for retained radiopaque soft tissue foreign body. 2. No acute bony abnormality. Electronically Signed   By: Kennith Center M.D.   On: 02/10/2023 16:08    Procedures Procedures     LACERATION REPAIR Performed by: Berlyn Saylor Authorized by: Dejan Angert Consent: Verbal consent obtained. Risks and benefits: risks, benefits and alternatives were discussed Consent given by: patient Patient identity confirmed: provided demographic data Prepped and Draped in normal sterile fashion Wound explored  Laceration Location: Left lower leg  Laceration Length: 5 cm  No Foreign Bodies seen or palpated  Anesthesia: local infiltration  Local anesthetic: lidocaine 2% without epinephrine  Anesthetic total: 5 ml  Irrigation method: syringe Amount of cleaning: standard  Skin closure: 4-0  Ethilon  Number of sutures: 9  Technique: Simple interrupted  Patient tolerance: Patient tolerated the procedure well with no immediate complications.   Medications Ordered in ED Medications  lidocaine HCl (PF) (XYLOCAINE) 2 % injection (5 mLs  Given 02/10/23 1649)    ED Course/ Medical Decision Making/ A&P                             Medical Decision Making Patient here for evaluation of laceration left lower leg.  Td up-to-date.  Neurovascular intact.  Bleeding controlled prior to my exam.   Obvious laceration, differential would also include foreign body, fracture  Amount and/or Complexity of Data Reviewed Radiology: ordered.  Details: X-ray without evidence of retained radiopaque foreign body, no acute bony injury. Discussion of management or test interpretation with external provider(s): Laceration loosely approximated, patient tolerated well.  Entire depth of wound explored, no foreign body seen.  Patient agreeable to wound care instructions, sutures out in 8 to 10 days, return precautions discussed.  Agreeable to Tylenol if needed for pain           Final Clinical Impression(s) / ED Diagnoses Final diagnoses:  Laceration of left lower leg with tendon involvement, initial encounter    Rx / DC Orders ED Discharge Orders     None         Rosey Bath 02/10/23 1703    Bethann Berkshire, MD 02/12/23 1129

## 2023-02-11 DIAGNOSIS — G7289 Other specified myopathies: Secondary | ICD-10-CM | POA: Diagnosis not present

## 2023-02-11 DIAGNOSIS — Z7409 Other reduced mobility: Secondary | ICD-10-CM | POA: Diagnosis not present

## 2023-02-24 DIAGNOSIS — S81811A Laceration without foreign body, right lower leg, initial encounter: Secondary | ICD-10-CM | POA: Diagnosis not present

## 2023-03-05 DIAGNOSIS — Z1283 Encounter for screening for malignant neoplasm of skin: Secondary | ICD-10-CM | POA: Diagnosis not present

## 2023-03-05 DIAGNOSIS — D485 Neoplasm of uncertain behavior of skin: Secondary | ICD-10-CM | POA: Diagnosis not present

## 2023-03-05 DIAGNOSIS — Z85858 Personal history of malignant neoplasm of other endocrine glands: Secondary | ICD-10-CM | POA: Diagnosis not present

## 2023-03-05 DIAGNOSIS — L570 Actinic keratosis: Secondary | ICD-10-CM | POA: Diagnosis not present

## 2023-03-05 DIAGNOSIS — S81811A Laceration without foreign body, right lower leg, initial encounter: Secondary | ICD-10-CM | POA: Diagnosis not present

## 2023-03-11 DIAGNOSIS — Z7409 Other reduced mobility: Secondary | ICD-10-CM | POA: Diagnosis not present

## 2023-03-11 DIAGNOSIS — G7289 Other specified myopathies: Secondary | ICD-10-CM | POA: Diagnosis not present

## 2023-04-09 DIAGNOSIS — G7289 Other specified myopathies: Secondary | ICD-10-CM | POA: Diagnosis not present

## 2023-04-23 DIAGNOSIS — G7281 Critical illness myopathy: Secondary | ICD-10-CM | POA: Diagnosis not present

## 2023-05-07 DIAGNOSIS — G7289 Other specified myopathies: Secondary | ICD-10-CM | POA: Diagnosis not present

## 2023-06-09 ENCOUNTER — Ambulatory Visit: Payer: PPO | Admitting: Urology

## 2023-06-09 ENCOUNTER — Encounter: Payer: Self-pay | Admitting: Gastroenterology

## 2023-06-09 ENCOUNTER — Ambulatory Visit (HOSPITAL_COMMUNITY)
Admission: RE | Admit: 2023-06-09 | Discharge: 2023-06-09 | Disposition: A | Discharge status: Home / Self Care | Payer: PPO | Source: Ambulatory Visit | Attending: Urology | Admitting: Urology

## 2023-06-09 VITALS — BP 120/63 | HR 81

## 2023-06-09 DIAGNOSIS — N200 Calculus of kidney: Secondary | ICD-10-CM | POA: Insufficient documentation

## 2023-06-09 DIAGNOSIS — Z09 Encounter for follow-up examination after completed treatment for conditions other than malignant neoplasm: Secondary | ICD-10-CM

## 2023-06-09 DIAGNOSIS — Z87442 Personal history of urinary calculi: Secondary | ICD-10-CM | POA: Diagnosis not present

## 2023-06-09 LAB — URINALYSIS, ROUTINE W REFLEX MICROSCOPIC
Bilirubin, UA: NEGATIVE
Glucose, UA: NEGATIVE
Ketones, UA: NEGATIVE
Leukocytes,UA: NEGATIVE
Nitrite, UA: NEGATIVE
Protein,UA: NEGATIVE
RBC, UA: NEGATIVE
Specific Gravity, UA: 1.025 (ref 1.005–1.030)
Urobilinogen, Ur: 0.2 mg/dL (ref 0.2–1.0)
pH, UA: 6 (ref 5.0–7.5)

## 2023-06-17 ENCOUNTER — Encounter: Payer: Self-pay | Admitting: Urology

## 2023-06-17 NOTE — Patient Instructions (Signed)

## 2023-06-23 ENCOUNTER — Other Ambulatory Visit: Payer: Self-pay

## 2023-06-23 ENCOUNTER — Encounter (HOSPITAL_COMMUNITY): Payer: Self-pay

## 2023-06-23 ENCOUNTER — Emergency Department (HOSPITAL_COMMUNITY)
Admission: EM | Admit: 2023-06-23 | Discharge: 2023-06-23 | Disposition: A | Discharge status: Home / Self Care | Payer: PPO | Attending: Emergency Medicine | Admitting: Emergency Medicine

## 2023-06-23 DIAGNOSIS — S81811A Laceration without foreign body, right lower leg, initial encounter: Secondary | ICD-10-CM | POA: Insufficient documentation

## 2023-06-23 DIAGNOSIS — W228XXA Striking against or struck by other objects, initial encounter: Secondary | ICD-10-CM | POA: Diagnosis not present

## 2023-06-23 DIAGNOSIS — Y93E5 Activity, floor mopping and cleaning: Secondary | ICD-10-CM | POA: Insufficient documentation

## 2023-06-23 DIAGNOSIS — S8991XA Unspecified injury of right lower leg, initial encounter: Secondary | ICD-10-CM | POA: Diagnosis present

## 2023-06-23 MED ORDER — LIDOCAINE-EPINEPHRINE (PF) 2 %-1:200000 IJ SOLN
20.0000 mL | Freq: Once | INTRAMUSCULAR | Status: AC
Start: 2023-06-23 — End: 2023-06-23
  Administered 2023-06-23: 20 mL
  Filled 2023-06-23: qty 20

## 2023-06-23 MED ORDER — BACITRACIN ZINC 500 UNIT/GM EX OINT
TOPICAL_OINTMENT | Freq: Once | CUTANEOUS | Status: AC
  Filled 2023-06-23: qty 0.9

## 2023-06-23 MED ORDER — OXYCODONE-ACETAMINOPHEN 5-325 MG PO TABS
1.0000 | ORAL_TABLET | Freq: Once | ORAL | Status: AC
  Administered 2023-06-23: 1 via ORAL
  Filled 2023-06-23: qty 1

## 2023-06-23 MED ORDER — TRIPLE ANTIBIOTIC 3.5-400-5000 EX OINT
TOPICAL_OINTMENT | Freq: Once | CUTANEOUS | Status: AC
  Filled 2023-06-23: qty 1

## 2023-06-23 NOTE — ED Notes (Signed)
Medicated per [facility] and suture cart outside door for provider Informed provider

## 2023-06-23 NOTE — ED Triage Notes (Signed)
Pt to er, pt states that his father passed away and they were cleaning out his house and he hit is leg on the coffee table and he cut his leg, states that the pain was bad and he passed out. States that ems came and bandaged up his leg. States that he didn't come to the er because he was needed to attend his father's funeral.  States that he is here now because he continues to have R leg pain.

## 2023-06-23 NOTE — ED Notes (Signed)
Provider suturing at bedside All dressing provided

## 2023-06-23 NOTE — ED Provider Notes (Signed)
[place] EMERGENCY DEPARTMENT AT [facility] Provider Note   CSN: [id number] Arrival date & time: 06/23/23  1723     History {Add pertinent medical, surgical, social history, OB history to HPI:1} Chief Complaint  Patient presents with   Leg Injury    Jacob Hunter is a 67 y.o. male .  HPI     Home Medications Prior to Admission medications   Medication Sig Start Date End Date Taking? Authorizing Provider  calcium-vitamin D (OSCAL WITH D) 500-200 MG-UNIT tablet Take 1 tablet by mouth daily with breakfast. 07/30/19   Shon Hale, MD  Cholecalciferol (VITAMIN D3 PO) Take by mouth.    [provider]  Coenzyme Q10 (CO Q 10) 100 MG CAPS Take 100 mg by mouth daily.    [provider]  diphenhydrAMINE (BENADRYL) 25 MG tablet Take 1 tablet (25 mg total) by mouth every 6 (six) hours. 02/09/23   Sabas Sous, MD  EPINEPHrine 0.3 mg/0.3 mL IJ SOAJ injection Inject 0.3 mg into the muscle as needed for anaphylaxis. 02/09/23   Sabas Sous, MD  famotidine (PEPCID) 20 MG tablet Take 1 tablet (20 mg total) by mouth 2 (two) times daily. Patient not taking: Reported on 06/09/2023 02/09/23   Sabas Sous, MD  folic acid (FOLVITE) 1 MG tablet Take 1 tablet (1 mg total) by mouth daily. 07/29/19   Shon Hale, MD  methotrexate (RHEUMATREX) 2.5 MG tablet Take 25 mg by mouth every Monday.     [provider]  Multiple Vitamin (MULTIVITAMIN) tablet Take 1 tablet by mouth daily.    [provider]  ondansetron (ZOFRAN-ODT) 8 MG disintegrating tablet Take 1 tablet (8 mg total) by mouth every 8 (eight) hours as needed for nausea or vomiting. Patient not taking: Reported on 06/09/2023 11/04/22   Dione Booze, MD  OVER THE COUNTER MEDICATION Vit D 3 daily.    [provider]  predniSONE (DELTASONE) 10 MG tablet Take 12.5 mg by mouth daily with breakfast.    [provider]  PRESCRIPTION MEDICATION IVIG Antibodies 2 visits  every 4 weeks    [provider]  riTUXimab (RITUXAN) 100 MG/10ML injection Inject 1,000 mg into the vein See admin instructions. 2 times a month in March and September    [provider]  tamsulosin (FLOMAX) 0.4 MG CAPS capsule TAKE 1 CAPSULE BY MOUTH EVERY DAY Patient not taking: Reported on 06/09/2023 02/09/23   Malen Gauze, MD  vitamin B-12 (CYANOCOBALAMIN) 1000 MCG tablet Take 1,000 mcg by mouth daily.    [provider]  vitamin k 100 MCG tablet Take 150 mcg by mouth daily.    [provider]      Allergies    Methylprednisolone, Nsaids, Demerol [meperidine], and Statins    Review of Systems   Review of Systems  Skin:        Right lower leg laceration  All other systems reviewed and are negative.   Physical Exam Updated Vital Signs BP 134/77 (BP Location: Right Arm)   Pulse 69   Temp 98.2 F (36.8 C) (Oral)   Resp 16   Ht 6\' 1"  (1.854 m)   Wt 105.2 kg   SpO2 96%   BMI 30.61 kg/m  Physical Exam  ED Results / Procedures / Treatments   Labs (all labs ordered are listed, but only abnormal results are displayed) Labs Reviewed - No data to display  EKG None  Radiology No results found.  Procedures .Marland KitchenLaceration  Repair  Date/Time: 06/23/2023 9:38 PM  Performed by: Maxwell Marion, PA-C Authorized by: Maxwell Marion, PA-C   Consent:    Consent obtained:  Verbal   Consent given by:  Patient   Risks discussed:  Infection and pain Anesthesia:    Anesthesia method:  Local infiltration   Local anesthetic:  Lidocaine 1% WITH epi Laceration details:    Location:  Leg   Leg location:  R lower leg   Length (cm):  13 Exploration:    Hemostasis achieved with:  Epinephrine Treatment:    Area cleansed with:  Saline   Irrigation solution:  Sterile saline   Irrigation method:  Pressure wash Skin repair:    Repair method:  Sutures   Suture size:  4-0   Suture material:  Prolene   Suture technique:  Simple interrupted    Number of sutures:  15 Repair type:    Repair type:  Simple Post-procedure details:    Dressing:  Antibiotic ointment, non-adherent dressing and adhesive bandage   Procedure completion:  Tolerated well, no immediate complications   {Document cardiac monitor, telemetry assessment procedure when appropriate:1}  Medications Ordered in ED Medications  lidocaine-EPINEPHrine (XYLOCAINE W/EPI) 2 %-1:200000 (PF) injection 20 mL (20 mLs Infiltration Given by Other 06/23/23 2101)  oxyCODONE-acetaminophen (PERCOCET/ROXICET) 5-325 MG per tablet 1 tablet (1 tablet Oral Given 06/23/23 1958)  neomycin-bacitracin-polymyxin [phone] OINT ( Apply externally Given by Other 06/23/23 2126)    ED Course/ Medical Decision Making/ A&P   {   Click here for ABCD2, HEART and other calculatorsREFRESH Note before signing :1}                              Medical Decision Making Risk OTC drugs. Prescription drug management.   This patient presents to the ED for concern of ***, this involves an extensive number of treatment options, and is a complaint that carries with it a high risk of complications and morbidity.   Differential diagnosis includes: ***   Comorbidities  ***   Additional History  Additional history obtained from *** External records from outside source obtained and reviewed including ***   Cardiac Monitoring / EKG  The patient was maintained on a cardiac monitor.  I personally viewed and interpreted the cardiac monitored which showed: *** with a heart rate of *** bpm.   Lab Tests  I ordered and personally interpreted labs.  The pertinent results include:  ***   Imaging Studies  I ordered imaging studies including ***  I independently visualized and interpreted imaging which showed: *** I agree with the radiologist interpretation   Consultations  I requested consultation with ***,  and discussed lab and imaging findings as well as pertinent plan - they recommend:  ***   Problem List / ED Course / Critical Interventions / Medication Management  *** I ordered medications including: *** for ***  Reevaluation of the patient after these medicines showed that the patient {resolved/improved/worsened:23923::"improved"} I have reviewed the patients home medicines and have made adjustments as needed   Social Determinants of Health  ***   Test / Admission - Considered  ***   {Document critical care time when appropriate:1} {Document review of labs and clinical decision tools ie heart score, Chads2Vasc2 etc:1}  {Document your independent review of radiology images, and any outside records:1} {Document your discussion with family members, caretakers, and with consultants:1} {Document social determinants of health affecting pt's care:1} {Document your decision making why  or why not admission, treatments were needed:1} Final Clinical Impression(s) / ED Diagnoses Final diagnoses:  None    Rx / DC Orders ED Discharge Orders     None

## 2023-06-23 NOTE — ED Notes (Signed)
Pt able to walk from VT2 to FT 21 Pt stated at 11am he hit his leg on end table EMS wrapped it, pt refused transport  Unwrapped bandage noted skin tear to RIGHT LE Pt attached to partial monitor  Waiting for provider eval

## 2023-06-23 NOTE — Discharge Instructions (Addendum)
Your sutures need to come out in 8-10 days, you can go to the ED, urgent care, or your PCP to have them removed.  Leave the dressing on your laceration for the next 24 hours. After that, you can leave the wound open to air. After the first 24 hours, you can begin to clean the wound site with soap and water to prevent crusting over the suture knots.  Do not go into pools, lakes, or oceans until the sutures are removed in order to prevent infection.   Get help right away if: You have very bad swelling around the wound. Your pain suddenly gets worse and is very bad. You have painful lumps near the wound or on skin anywhere on your body. You have a red streak going away from your wound. The wound is on your hand or foot, and: You cannot move a finger or toe. Your fingers or toes look pale or bluish.

## 2023-06-25 DIAGNOSIS — Z79899 Other long term (current) drug therapy: Secondary | ICD-10-CM | POA: Diagnosis not present

## 2023-06-25 DIAGNOSIS — T50905A Adverse effect of unspecified drugs, medicaments and biological substances, initial encounter: Secondary | ICD-10-CM | POA: Diagnosis not present

## 2023-06-25 DIAGNOSIS — D84821 Immunodeficiency due to drugs: Secondary | ICD-10-CM | POA: Diagnosis not present

## 2023-06-25 DIAGNOSIS — T781XXA Other adverse food reactions, not elsewhere classified, initial encounter: Secondary | ICD-10-CM | POA: Diagnosis not present

## 2023-06-25 DIAGNOSIS — L509 Urticaria, unspecified: Secondary | ICD-10-CM | POA: Diagnosis not present

## 2023-07-02 DIAGNOSIS — S81801A Unspecified open wound, right lower leg, initial encounter: Secondary | ICD-10-CM | POA: Diagnosis not present

## 2023-07-29 DIAGNOSIS — G7289 Other specified myopathies: Secondary | ICD-10-CM | POA: Diagnosis not present

## 2023-08-20 DIAGNOSIS — L570 Actinic keratosis: Secondary | ICD-10-CM | POA: Diagnosis not present

## 2023-08-29 DIAGNOSIS — L509 Urticaria, unspecified: Secondary | ICD-10-CM | POA: Diagnosis not present

## 2023-08-29 DIAGNOSIS — T781XXD Other adverse food reactions, not elsewhere classified, subsequent encounter: Secondary | ICD-10-CM | POA: Diagnosis not present

## 2023-10-06 DIAGNOSIS — G7289 Other specified myopathies: Secondary | ICD-10-CM | POA: Diagnosis not present

## 2023-10-17 DIAGNOSIS — G7289 Other specified myopathies: Secondary | ICD-10-CM | POA: Diagnosis not present

## 2023-10-30 DIAGNOSIS — Z79899 Other long term (current) drug therapy: Secondary | ICD-10-CM | POA: Diagnosis not present

## 2023-10-30 DIAGNOSIS — Z125 Encounter for screening for malignant neoplasm of prostate: Secondary | ICD-10-CM | POA: Diagnosis not present

## 2023-10-30 DIAGNOSIS — G7281 Critical illness myopathy: Secondary | ICD-10-CM | POA: Diagnosis not present

## 2023-10-30 DIAGNOSIS — K760 Fatty (change of) liver, not elsewhere classified: Secondary | ICD-10-CM | POA: Diagnosis not present

## 2023-10-30 DIAGNOSIS — E785 Hyperlipidemia, unspecified: Secondary | ICD-10-CM | POA: Diagnosis not present

## 2023-10-30 DIAGNOSIS — F419 Anxiety disorder, unspecified: Secondary | ICD-10-CM | POA: Diagnosis not present

## 2023-10-30 DIAGNOSIS — M1000 Idiopathic gout, unspecified site: Secondary | ICD-10-CM | POA: Diagnosis not present

## 2023-10-31 DIAGNOSIS — G7289 Other specified myopathies: Secondary | ICD-10-CM | POA: Diagnosis not present

## 2023-11-04 DIAGNOSIS — G7289 Other specified myopathies: Secondary | ICD-10-CM | POA: Diagnosis not present

## 2023-12-02 DIAGNOSIS — G7289 Other specified myopathies: Secondary | ICD-10-CM | POA: Diagnosis not present

## 2023-12-30 DIAGNOSIS — G7289 Other specified myopathies: Secondary | ICD-10-CM | POA: Diagnosis not present

## 2024-01-13 DIAGNOSIS — M109 Gout, unspecified: Secondary | ICD-10-CM | POA: Diagnosis not present

## 2024-01-13 DIAGNOSIS — N200 Calculus of kidney: Secondary | ICD-10-CM | POA: Diagnosis not present

## 2024-01-13 DIAGNOSIS — G7281 Critical illness myopathy: Secondary | ICD-10-CM | POA: Diagnosis not present

## 2024-01-13 DIAGNOSIS — F411 Generalized anxiety disorder: Secondary | ICD-10-CM | POA: Diagnosis not present

## 2024-01-13 DIAGNOSIS — Z0001 Encounter for general adult medical examination with abnormal findings: Secondary | ICD-10-CM | POA: Diagnosis not present

## 2024-01-13 DIAGNOSIS — E785 Hyperlipidemia, unspecified: Secondary | ICD-10-CM | POA: Diagnosis not present

## 2024-01-26 DIAGNOSIS — G7289 Other specified myopathies: Secondary | ICD-10-CM | POA: Diagnosis not present

## 2024-02-17 DIAGNOSIS — G7289 Other specified myopathies: Secondary | ICD-10-CM | POA: Diagnosis not present

## 2024-02-17 DIAGNOSIS — Z7409 Other reduced mobility: Secondary | ICD-10-CM | POA: Diagnosis not present

## 2024-02-23 DIAGNOSIS — G7289 Other specified myopathies: Secondary | ICD-10-CM | POA: Diagnosis not present

## 2024-02-24 DIAGNOSIS — L570 Actinic keratosis: Secondary | ICD-10-CM | POA: Diagnosis not present

## 2024-03-22 DIAGNOSIS — G7289 Other specified myopathies: Secondary | ICD-10-CM | POA: Diagnosis not present

## 2024-04-08 DIAGNOSIS — D1730 Benign lipomatous neoplasm of skin and subcutaneous tissue of unspecified sites: Secondary | ICD-10-CM | POA: Diagnosis not present

## 2024-04-08 DIAGNOSIS — Z79899 Other long term (current) drug therapy: Secondary | ICD-10-CM | POA: Diagnosis not present

## 2024-04-20 DIAGNOSIS — G7289 Other specified myopathies: Secondary | ICD-10-CM | POA: Diagnosis not present

## 2024-04-27 ENCOUNTER — Other Ambulatory Visit: Payer: Self-pay | Admitting: *Deleted

## 2024-04-27 DIAGNOSIS — R222 Localized swelling, mass and lump, trunk: Secondary | ICD-10-CM

## 2024-04-27 DIAGNOSIS — G7289 Other specified myopathies: Secondary | ICD-10-CM | POA: Diagnosis not present

## 2024-04-27 NOTE — Progress Notes (Signed)
 Pt completed his Ruxience  without any complication .alert oriented x3 VSS.PIV d/cd pt discharge home with his wife.

## 2024-04-29 ENCOUNTER — Ambulatory Visit (INDEPENDENT_AMBULATORY_CARE_PROVIDER_SITE_OTHER): Admitting: General Surgery

## 2024-04-29 ENCOUNTER — Encounter: Payer: Self-pay | Admitting: General Surgery

## 2024-04-29 VITALS — BP 121/76 | HR 77 | Temp 98.1°F | Resp 14 | Ht 73.0 in | Wt 235.0 lb

## 2024-04-29 DIAGNOSIS — R222 Localized swelling, mass and lump, trunk: Secondary | ICD-10-CM | POA: Insufficient documentation

## 2024-04-29 NOTE — Progress Notes (Signed)
 [facility] History and Physical  Reason for Referral: Left chest wall mass?  Referring Physician:  Sheryle Carwin, MD   Chief Complaint   New Patient (Initial Visit)     Jacob Hunter is a 68 y.o. male.  HPI:   Discussed the use of AI scribe software for clinical note transcription with the patient, who gave verbal consent to proceed.  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  He has noticed a mass on his left chest, beneath the nipple and rib cage, for the past few months. The mass is not visible externally. It causes pain when pressure is applied or during activities that involve sudden movements, such as riding a tractor or hitting a speed bump. The pain is described as a jolt when the area is pressed. No redness, fluid drainage, or pus is associated with the mass.  He has a history of polymyositis and necrotizing myopathy, managed with monthly IVIG and rituximab  infusions, along with methotrexate and prednisone . These medications are necessary to manage his condition, as not taking them results in significant pain and immobility.  His surgical history includes ankle surgery, a full knee replacement, and carpal tunnel surgery, all on the left side. He notes that healing from surgeries is prolonged due to his medication regimen, citing a scar that took six months to heal.     Past Medical History:  Diagnosis Date   Arthritis    Gout    Knee pain    Necrotizing myopathy    Pneumonia    Polymyositis [facility])     Past Surgical History:  Procedure Laterality Date   ANKLE SURGERY Left    BIOPSY  11/11/2022   Procedure: BIOPSY;  Surgeon: Shaaron Lamar CHRISTELLA, MD;  Location: [facility];  Service: Endoscopy;;   COLONOSCOPY N/A 10/05/2019   Rourk: 3 colonic polyps removed, 2 were tubular adenomas, cecal polyp was sessile serrated polyp.   Advised 3-year surveillance colonoscopy.   COLONOSCOPY WITH PROPOFOL  N/A 11/11/2022   Procedure: COLONOSCOPY WITH PROPOFOL ;  Surgeon: Shaaron Lamar CHRISTELLA, MD;  Location: [facility];  Service: Endoscopy;  Laterality: N/A;  8:15 am ileocolonoscopy   EYE SURGERY     bil cataracts   KNEE SURGERY Left    arthroscopy x 2   left wrist surgery     POLYPECTOMY  10/05/2019   Procedure: POLYPECTOMY;  Surgeon: Shaaron Lamar CHRISTELLA, MD;  Location: [facility];  Service: Endoscopy;;   POLYPECTOMY  11/11/2022   Procedure: POLYPECTOMY;  Surgeon: Shaaron Lamar CHRISTELLA, MD;  Location: [facility];  Service: Endoscopy;;   TOTAL KNEE ARTHROPLASTY Left 10/04/2020   Procedure: TOTAL KNEE ARTHROPLASTY;  Surgeon: Gerome Lamar, MD;  Location: [facility];  Service: Orthopedics;  Laterality: Left;  adductor canal    Family History  Problem Relation Age of Onset   Hypertension Mother    Hypertension Father    Hypercholesterolemia Father    Hypercholesterolemia Sister    Breast cancer Sister    Colon cancer Neg Hx    Liver disease Neg Hx    Inflammatory bowel disease Neg Hx     Social History   Tobacco Use   Smoking status: Never   Smokeless tobacco: Never  Vaping Use   Vaping status: Never Used  Substance Use Topics   Alcohol use: No    Alcohol/week: 0.0 standard drinks  of alcohol    Comment: Last ETOH was 1-2 drinks in 1990.   Drug use: No    Medications: I have reviewed the patient's current medications. 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 If you have any questions, ask your nurse or doctor.          STOP taking these medications    diphenhydrAMINE  25 MG tablet Commonly  known as: BENADRYL  Stopped by: Manuelita JAYSON Pander   famotidine  20 MG tablet Commonly known as: PEPCID  Stopped by: Manuelita JAYSON Pander   ondansetron  8 MG disintegrating tablet Commonly known as: ZOFRAN -ODT Stopped by: Manuelita JAYSON Pander   tamsulosin  0.4 MG Caps capsule Commonly known as: FLOMAX  Stopped by: Manuelita JAYSON Pander       TAKE these medications    calcium -vitamin D  500-200 MG-UNIT tablet Commonly known as: OSCAL WITH D Take 1 tablet by mouth daily with breakfast.   Co Q 10 100 MG Caps Take 100 mg by mouth daily.   cyanocobalamin 1000 MCG tablet Commonly known as: VITAMIN B12 Take 1,000 mcg by mouth daily.   EPINEPHrine  0.3 mg/0.3 mL Soaj injection Commonly known as: EPI-PEN Inject 0.3 mg into the muscle as needed for anaphylaxis.   FLUoxetine  20 MG capsule Commonly known as: PROZAC  Take 20 mg by mouth daily.   folic acid  1 MG tablet Commonly known as: FOLVITE  Take 1 tablet (1 mg total) by mouth daily.   methotrexate 2.5 MG tablet Commonly known as: RHEUMATREX Take 25 mg by mouth every Monday.   multivitamin tablet Take 1 tablet by mouth daily.   predniSONE  10 MG tablet Commonly known as: DELTASONE  Take 12.5 mg by mouth daily with breakfast.   PRESCRIPTION MEDICATION IVIG Antibodies 2 visits every 4 weeks   riTUXimab  100 MG/10ML injection Commonly known as: RITUXAN  Inject 1,000 mg into the vein See admin instructions. 2 times a month in March and September   VITAMIN D3 PO Take by mouth.   vitamin k 100 MCG tablet Take 150 mcg by mouth daily.         ROS:  A comprehensive review of systems was negative except for: Musculoskeletal: positive for pain over the left lower rib with movement and palpation  Blood pressure 121/76, pulse 77, temperature 98.1 F (36.7 C), temperature source Oral, resp. rate 14, height 6' 1 (1.854 m), weight 235 lb (106.6 kg), SpO2 94%.  Physical Exam GENERAL: Alert, cooperative, well developed, no acute  distress. HEENT: Normocephalic, normal oropharynx, moist mucous membranes. CHEST: Clear to auscultation bilaterally, no wheezes, rhonchi, or crackles. No palpable mass on left chest wall. CARDIOVASCULAR: Normal heart rate and rhythm ABDOMEN: Soft, non-tender, non-distended, without organomegaly, normal bowel sounds. EXTREMITIES: No cyanosis or edema. NEUROLOGICAL: Cranial nerves grossly intact, moves all extremities without gross motor or sensory deficit.  Results: CT reviewed from 2024- cannot see any obvious lipoma or cyst     Assessment & Plan Left chest wall intermittent pain Intermittent pain from something but cannot feel or see a mass in the area on imaging.  - Review previous CT scan. - Consider localized ultrasound for further evaluation. -  Avoid surgery unless imaging shows something obvious that could be causing the pain.  - Discussed risk of healing issues given his immunotherapy    Future Appointments  Date Time Provider Department Center  04/30/2024  3:30 PM [facility]  5 [facility]   [facility]  06/09/2024  9:20 AM McKenzie, Belvie CROME, MD [facility] None    All questions were answered to the satisfaction of the patient.   Manuelita JAYSON Pander 04/29/2024, 3:20 PM

## 2024-04-29 NOTE — Patient Instructions (Signed)
 Will get an ultrasound to look at the area. Will call you with the results. If there is something that we can remove, we will discuss the options further.

## 2024-04-30 ENCOUNTER — Ambulatory Visit (HOSPITAL_COMMUNITY)
Admission: RE | Admit: 2024-04-30 | Discharge: 2024-04-30 | Disposition: A | Discharge status: Home / Self Care | Source: Ambulatory Visit | Attending: General Surgery | Admitting: General Surgery

## 2024-04-30 DIAGNOSIS — R079 Chest pain, unspecified: Secondary | ICD-10-CM | POA: Diagnosis not present

## 2024-04-30 DIAGNOSIS — R222 Localized swelling, mass and lump, trunk: Secondary | ICD-10-CM | POA: Diagnosis not present

## 2024-05-07 ENCOUNTER — Ambulatory Visit: Payer: Self-pay | Admitting: General Surgery

## 2024-05-07 NOTE — Progress Notes (Signed)
 US  does not show anything that would correlate in the area where he is hurting. I think he may have some tenderness over the muscle or rib. I would not recommend any surgery. He may want to try some lidocaine  patches over the counter and see if that helps.

## 2024-05-11 DIAGNOSIS — G7289 Other specified myopathies: Secondary | ICD-10-CM | POA: Diagnosis not present

## 2024-05-27 DIAGNOSIS — G7289 Other specified myopathies: Secondary | ICD-10-CM | POA: Diagnosis not present

## 2024-06-07 ENCOUNTER — Other Ambulatory Visit: Payer: Self-pay

## 2024-06-07 DIAGNOSIS — N200 Calculus of kidney: Secondary | ICD-10-CM

## 2024-06-09 ENCOUNTER — Ambulatory Visit: Payer: PPO | Admitting: Urology

## 2024-06-09 ENCOUNTER — Ambulatory Visit (HOSPITAL_COMMUNITY)
Admission: RE | Admit: 2024-06-09 | Discharge: 2024-06-09 | Disposition: A | Discharge status: Home / Self Care | Source: Ambulatory Visit | Attending: Urology | Admitting: Urology

## 2024-06-09 VITALS — BP 116/67 | HR 87

## 2024-06-09 DIAGNOSIS — N200 Calculus of kidney: Secondary | ICD-10-CM | POA: Insufficient documentation

## 2024-06-09 DIAGNOSIS — I878 Other specified disorders of veins: Secondary | ICD-10-CM | POA: Diagnosis not present

## 2024-06-09 DIAGNOSIS — N3281 Overactive bladder: Secondary | ICD-10-CM | POA: Diagnosis not present

## 2024-06-09 LAB — URINALYSIS, ROUTINE W REFLEX MICROSCOPIC
Bilirubin, UA: NEGATIVE
Glucose, UA: NEGATIVE
Ketones, UA: NEGATIVE
Leukocytes,UA: NEGATIVE
Nitrite, UA: NEGATIVE
Protein,UA: NEGATIVE
RBC, UA: NEGATIVE
Specific Gravity, UA: 1.025 (ref 1.005–1.030)
Urobilinogen, Ur: 1 mg/dL (ref 0.2–1.0)
pH, UA: 6 (ref 5.0–7.5)

## 2024-06-09 NOTE — Patient Instructions (Signed)

## 2024-06-09 NOTE — Progress Notes (Signed)
 06/09/2024 10:10 AM   Jacob Hunter 1955-09-08 [id number]  Referring provider: Sheryle Carwin, MD [address] [place],  [place] [address]  Followup nephrolithiasis   HPI: [name] is a 35bn here for followup for nephrolithiasis. KUB shows possible 2-20mm right lower pole calculus. He has urinary urgency and has to double void on occasion. He notes when he drinks soda or milk he has worsening urinary urgency and frequency. IPSS 8 QOL 3 on no BPH therapy. Nocturia 1x.    PMH: Past Medical History:  Diagnosis Date   Arthritis    Gout    Knee pain    Necrotizing myopathy    Pneumonia    Polymyositis [facility])     Surgical History: Past Surgical History:  Procedure Laterality Date   ANKLE SURGERY Left    BIOPSY  11/11/2022   Procedure: BIOPSY;  Surgeon: Shaaron Lamar CHRISTELLA, MD;  Location: [facility];  Service: Endoscopy;;   COLONOSCOPY N/A 10/05/2019   Rourk: 3 colonic polyps removed, 2 were tubular adenomas, cecal polyp was sessile serrated polyp.  Advised 3-year surveillance colonoscopy.   COLONOSCOPY WITH PROPOFOL  N/A 11/11/2022   Procedure: COLONOSCOPY WITH PROPOFOL ;  Surgeon: Shaaron Lamar CHRISTELLA, MD;  Location: [facility];  Service: Endoscopy;  Laterality: N/A;  8:15 am ileocolonoscopy   EYE SURGERY     bil cataracts   KNEE SURGERY Left    arthroscopy x 2   left wrist surgery     POLYPECTOMY  10/05/2019   Procedure: POLYPECTOMY;  Surgeon: Shaaron Lamar CHRISTELLA, MD;  Location: [facility];  Service: Endoscopy;;   POLYPECTOMY  11/11/2022   Procedure: POLYPECTOMY;  Surgeon: Shaaron Lamar CHRISTELLA, MD;  Location: [facility];  Service: Endoscopy;;   TOTAL KNEE ARTHROPLASTY Left 10/04/2020   Procedure: TOTAL KNEE ARTHROPLASTY;  Surgeon: Gerome Lamar, MD;  Location: [facility];  Service: Orthopedics;  Laterality: Left;  adductor canal    Home Medications:  Allergies as of 06/09/2024       Reactions   Methylprednisolone  Anaphylaxis, Shortness Of Breath, Rash   Nsaids Shortness Of  Breath, Itching, Rash   Demerol  [meperidine ] Hives   Statins Other (See Comments)   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 If you have any questions, ask your nurse or doctor.          calcium -vitamin D  500-200 MG-UNIT tablet Commonly known as: OSCAL WITH D Take 1 tablet by mouth daily with breakfast.   Co Q 10 100 MG Caps Take 100 mg by mouth daily.   cyanocobalamin 1000 MCG tablet Commonly known as: VITAMIN B12 Take 1,000 mcg by mouth daily.   EPINEPHrine  0.3 mg/0.3 mL Soaj injection Commonly known as: EPI-PEN Inject 0.3 mg into the muscle as needed for anaphylaxis.   FLUoxetine  20 MG capsule Commonly known as: PROZAC  Take 20 mg by mouth daily.   folic acid  1 MG tablet Commonly known as: FOLVITE  Take 1 tablet (1 mg total) by mouth daily.   methotrexate 2.5 MG tablet Commonly known as: RHEUMATREX Take 25 mg by mouth every Monday.   multivitamin tablet Take 1 tablet by mouth daily.   predniSONE  10 MG tablet Commonly known as: DELTASONE  Take 12.5 mg by mouth daily with breakfast.   PRESCRIPTION MEDICATION IVIG Antibodies 2 visits every 4 weeks   riTUXimab  100 MG/10ML injection Commonly known  as: RITUXAN  Inject 1,000 mg into the vein See admin instructions. 2 times a month in March and September   VITAMIN D3 PO Take by mouth.   vitamin k 100 MCG tablet Take 150 mcg by mouth daily.        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 He has never used smokeless tobacco. He reports that he does not drink alcohol and does not use drugs.  ROS: All other review of systems were reviewed and are negative except what is noted above in HPI  Physical Exam: BP 116/67   Pulse 87   Constitutional:  Alert and oriented, No acute distress. HEENT: [place] AT, moist mucus membranes.  Trachea midline, no masses. Cardiovascular: No clubbing, cyanosis, or edema. Respiratory: Normal respiratory effort, no increased work of breathing. GI: Abdomen is soft, nontender, nondistended, no abdominal masses GU: No CVA tenderness.  Lymph: No cervical or inguinal lymphadenopathy. Skin: No rashes, bruises or suspicious lesions. Neurologic: Grossly intact, no focal deficits, moving all 4 extremities. Psychiatric: Normal mood and affect.  Laboratory Data: Lab Results  Component Value Date   WBC 13.4 (H) 11/04/2022   HGB 13.3 11/04/2022   HCT 40.6 11/04/2022   MCV 94.9 11/04/2022   PLT 192 11/04/2022    Lab Results  Component Value Date   CREATININE 1.16 11/04/2022    No results found for: PSA  No results found for: TESTOSTERONE  No results found for: HGBA1C  Urinalysis    Component Value Date/Time   COLORURINE AMBER (A) 11/04/2022 0016   APPEARANCEUR Clear 06/09/2023 0912   LABSPEC 1.016 11/04/2022 0016   PHURINE 5.0 11/04/2022 0016   GLUCOSEU Negative 06/09/2023 0912   HGBUR LARGE (A) 11/04/2022 0016   BILIRUBINUR Negative 06/09/2023 0912   KETONESUR NEGATIVE 11/04/2022 0016   PROTEINUR Negative 06/09/2023 0912   PROTEINUR 30 (A) 11/04/2022 0016   UROBILINOGEN 0.2 01/24/2015 1748   NITRITE Negative 06/09/2023 0912   NITRITE NEGATIVE 11/04/2022 0016   LEUKOCYTESUR Negative 06/09/2023 0912   LEUKOCYTESUR NEGATIVE 11/04/2022 0016     Lab Results  Component Value Date   LABMICR Comment 06/09/2023   BACTERIA RARE (A) 11/04/2022    Pertinent Imaging: KUB today: Images reviewed and discussed with the patient  Results for orders placed during the hospital encounter of 06/09/23  Abdomen 1 view (KUB)  Narrative CLINICAL DATA:  Nephrolithiasis.  EXAM: ABDOMEN - 1 VIEW  COMPARISON:  12/04/2022, CT 11/04/2022  FINDINGS: Punctate intrarenal calculi on prior CT are not seen by radiograph. Calcifications in the left pelvis correspond to phleboliths. Normal bowel gas pattern without evidence of obstruction. Small to moderate colonic stool burden. No acute osseous findings  IMPRESSION: Punctate intrarenal calculi on prior CT are not seen by radiograph.   Electronically Signed By: Andrea Gasman M.D. On: 06/30/2023 16:12  Results for orders  placed during the hospital encounter of 08/05/19  US  Venous Img Lower Bilateral (DVT)  Narrative CLINICAL DATA:  Bilateral lower extremity edema.  EXAM: BILATERAL LOWER EXTREMITY VENOUS DOPPLER ULTRASOUND  TECHNIQUE: Gray-scale sonography with graded compression, as well as color Doppler and duplex ultrasound were performed to evaluate the lower extremity deep venous systems from the level of the common femoral vein and including the common femoral, femoral, profunda femoral, popliteal and calf veins including the posterior tibial, peroneal and gastrocnemius veins when visible. The superficial great saphenous vein was also interrogated. Spectral Doppler was utilized to evaluate flow at rest and with distal augmentation maneuvers in the common femoral, femoral and popliteal veins.  COMPARISON:  None.  FINDINGS: RIGHT LOWER EXTREMITY  Common Femoral Vein: No evidence of thrombus. Normal compressibility, respiratory phasicity and response to augmentation.  Saphenofemoral Junction: No evidence of thrombus. Normal compressibility and flow on color Doppler  imaging.  Profunda Femoral Vein: No evidence of thrombus. Normal compressibility and flow on color Doppler imaging.  Femoral Vein: No evidence of thrombus. Normal compressibility, respiratory phasicity and response to augmentation.  Popliteal Vein: No evidence of thrombus. Normal compressibility, respiratory phasicity and response to augmentation.  Calf Veins: No evidence of thrombus. Normal compressibility and flow on color Doppler imaging.  Superficial Great Saphenous Vein: No evidence of thrombus. Normal compressibility.  Venous Reflux:  None.  Other Findings: No evidence of superficial thrombophlebitis or abnormal fluid collection.  LEFT LOWER EXTREMITY  Common Femoral Vein: No evidence of thrombus. Normal compressibility, respiratory phasicity and response to augmentation.  Saphenofemoral Junction: No evidence of thrombus. Normal compressibility and flow on color Doppler imaging.  Profunda Femoral Vein: No evidence of thrombus. Normal compressibility and flow on color Doppler imaging.  Femoral Vein: No evidence of thrombus. Normal compressibility, respiratory phasicity and response to augmentation.  Popliteal Vein: No evidence of thrombus. Normal compressibility, respiratory phasicity and response to augmentation.  Calf Veins: No evidence of thrombus. Normal compressibility and flow on color Doppler imaging.  Superficial Great Saphenous Vein: No evidence of thrombus. Normal compressibility.  Venous Reflux:  None.  Other Findings: No evidence of superficial thrombophlebitis or abnormal fluid collection.  IMPRESSION: No evidence of deep venous thrombosis in either lower extremity.   Electronically Signed By: Marcey Moan M.D. On: 08/05/2019 17:30  No results found for this or any previous visit.  No results found for this or any previous visit.  No results found for this or any previous visit.  No results found for this or any previous  visit.  No results found for this or any previous visit.  Results for orders placed during the hospital encounter of 11/04/22  CT Renal Stone Study  Narrative CLINICAL DATA:  c/o lower abdominal pain, N/V/D. States has not been able to void like normal today and also noticed small amount of blood in his urine. Denies any other urinary symptoms Abdominal/flank pain, stone suspected  EXAM: CT ABDOMEN AND PELVIS WITHOUT CONTRAST  TECHNIQUE: Multidetector CT imaging of the abdomen and pelvis was performed following the standard protocol without IV contrast.  RADIATION DOSE REDUCTION: This exam was performed according to the departmental dose-optimization program which includes automated exposure control, adjustment of the mA and/or kV according to patient size and/or use of iterative reconstruction technique.  COMPARISON:  CT abdomen pelvis 10/27/2022, CT abdomen pelvis 12/01/2020  FINDINGS: Lower chest: No acute abnormality.  Hepatobiliary:  Similar-appearing scattered hepatic hypodensities. The lesions greater than 1 cm measure simple fluid. Subcentimeter hypodensities are too  small to characterize. No gallstones, gallbladder wall thickening, or pericholecystic fluid. No biliary dilatation.  Pancreas: No focal lesion. Normal pancreatic contour. No surrounding inflammatory changes. No main pancreatic ductal dilatation.  Spleen: Normal in size without focal abnormality.  Adrenals/Urinary Tract:  No adrenal nodule bilaterally.  4 mm calcified stone within the distal left ureter with associated mild proximal hydronephrosis. Punctate left nephrolithiasis. Trace periureteral fat stranding. No right hydronephrosis. No right nephroureterolithiasis.  The urinary bladder is unremarkable.  Stomach/Bowel: Stomach is within normal limits. No evidence of bowel wall thickening or dilatation. Colonic diverticulosis. Appendix appears normal.  Vascular/Lymphatic: No abdominal  aorta or iliac aneurysm. Mild atherosclerotic plaque of the aorta and its branches. No abdominal, pelvic, or inguinal lymphadenopathy.  Reproductive: Prostate is unremarkable.  Other: No intraperitoneal free fluid. No intraperitoneal free gas. No organized fluid collection.  Musculoskeletal:  No abdominal wall hernia or abnormality.  No suspicious lytic or blastic osseous lesions. No acute displaced fracture. Multilevel degenerative changes of the spine.  IMPRESSION: 1. Obstructive 4 mm distal left ureterolithiasis. Correlate with urinalysis to exclude superimposed infection. 2. Nonobstructive punctate left nephrolithiasis. 3. Colonic diverticulosis with no acute diverticulitis.   Electronically Signed By: Morgane  Naveau M.D. On: 11/04/2022 01:32   Assessment & Plan:    1. Kidney stones (Primary) - followup 1 year with a KUB - Urinalysis, Routine w reflex microscopic  2. OAB (overactive bladder) Patient defers therapy at this time   No follow-ups on file.  Belvie Clara, MD  [facility] [place]

## 2024-06-15 ENCOUNTER — Encounter: Payer: Self-pay | Admitting: Urology

## 2024-06-22 DIAGNOSIS — R531 Weakness: Secondary | ICD-10-CM | POA: Diagnosis not present

## 2024-06-23 DIAGNOSIS — R4182 Altered mental status, unspecified: Secondary | ICD-10-CM | POA: Diagnosis not present

## 2024-06-23 DIAGNOSIS — I358 Other nonrheumatic aortic valve disorders: Secondary | ICD-10-CM | POA: Diagnosis not present

## 2024-07-05 DIAGNOSIS — R55 Syncope and collapse: Secondary | ICD-10-CM | POA: Diagnosis not present

## 2024-07-05 DIAGNOSIS — Z23 Encounter for immunization: Secondary | ICD-10-CM | POA: Diagnosis not present

## 2024-07-05 DIAGNOSIS — G7281 Critical illness myopathy: Secondary | ICD-10-CM | POA: Diagnosis not present

## 2025-06-08 ENCOUNTER — Ambulatory Visit: Admitting: Urology
